# Patient Record
Sex: Male | Born: 1950 | Race: Black or African American | Hispanic: No | Smoking: Former smoker
Health system: Southern US, Community
[De-identification: ages and names within clinical notes are randomized; demographics above are authoritative.]

## PROBLEM LIST (undated history)

## (undated) DIAGNOSIS — F329 Major depressive disorder, single episode, unspecified: Secondary | ICD-10-CM

## (undated) DIAGNOSIS — J189 Pneumonia, unspecified organism: Secondary | ICD-10-CM

## (undated) DIAGNOSIS — F419 Anxiety disorder, unspecified: Secondary | ICD-10-CM

## (undated) DIAGNOSIS — D649 Anemia, unspecified: Secondary | ICD-10-CM

## (undated) DIAGNOSIS — F319 Bipolar disorder, unspecified: Secondary | ICD-10-CM

## (undated) DIAGNOSIS — F32A Depression, unspecified: Secondary | ICD-10-CM

## (undated) DIAGNOSIS — I10 Essential (primary) hypertension: Secondary | ICD-10-CM

## (undated) DIAGNOSIS — Z9289 Personal history of other medical treatment: Secondary | ICD-10-CM

## (undated) DIAGNOSIS — F209 Schizophrenia, unspecified: Secondary | ICD-10-CM

## (undated) DIAGNOSIS — F0390 Unspecified dementia without behavioral disturbance: Secondary | ICD-10-CM

## (undated) DIAGNOSIS — Z8489 Family history of other specified conditions: Secondary | ICD-10-CM

## (undated) HISTORY — DX: Anemia, unspecified: D64.9

## (undated) HISTORY — DX: Depression, unspecified: F32.A

## (undated) HISTORY — DX: Major depressive disorder, single episode, unspecified: F32.9

## (undated) HISTORY — DX: Essential (primary) hypertension: I10

## (undated) HISTORY — DX: Unspecified dementia, unspecified severity, without behavioral disturbance, psychotic disturbance, mood disturbance, and anxiety: F03.90

## (undated) HISTORY — DX: Bipolar disorder, unspecified: F31.9

---
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
## 1991-07-28 HISTORY — PX: COLECTOMY: SHX59

## 2007-05-22 ENCOUNTER — Emergency Department (HOSPITAL_COMMUNITY): Admission: EM | Admit: 2007-05-22 | Discharge: 2007-05-22 | Payer: Self-pay | Admitting: Family Medicine

## 2007-06-20 ENCOUNTER — Emergency Department (HOSPITAL_COMMUNITY): Admission: EM | Admit: 2007-06-20 | Discharge: 2007-06-20 | Payer: Self-pay | Admitting: Family Medicine

## 2008-10-09 ENCOUNTER — Emergency Department (HOSPITAL_COMMUNITY): Admission: EM | Admit: 2008-10-09 | Discharge: 2008-10-09 | Payer: Self-pay | Admitting: Emergency Medicine

## 2009-06-26 ENCOUNTER — Emergency Department (HOSPITAL_COMMUNITY): Admission: EM | Admit: 2009-06-26 | Discharge: 2009-06-26 | Payer: Self-pay | Admitting: Family Medicine

## 2010-06-08 ENCOUNTER — Emergency Department (HOSPITAL_COMMUNITY): Admission: EM | Admit: 2010-06-08 | Discharge: 2010-06-08 | Payer: Self-pay | Admitting: Emergency Medicine

## 2010-10-07 LAB — URINALYSIS, ROUTINE W REFLEX MICROSCOPIC
Bilirubin Urine: NEGATIVE
Glucose, UA: NEGATIVE mg/dL
Ketones, ur: NEGATIVE mg/dL
Protein, ur: NEGATIVE mg/dL
pH: 6.5 (ref 5.0–8.0)

## 2010-10-07 LAB — URINE CULTURE
Colony Count: NO GROWTH
Culture  Setup Time: 201111132105
Culture: NO GROWTH

## 2010-10-07 LAB — CBC
HCT: 36.2 % — ABNORMAL LOW (ref 39.0–52.0)
MCV: 89.7 fL (ref 78.0–100.0)
RBC: 4.04 MIL/uL — ABNORMAL LOW (ref 4.22–5.81)
RDW: 14.1 % (ref 11.5–15.5)
WBC: 11.1 10*3/uL — ABNORMAL HIGH (ref 4.0–10.5)

## 2010-10-07 LAB — DIFFERENTIAL
Eosinophils Relative: 1 % (ref 0–5)
Lymphocytes Relative: 15 % (ref 12–46)
Monocytes Absolute: 0.9 10*3/uL (ref 0.1–1.0)
Monocytes Relative: 9 % (ref 3–12)
Neutro Abs: 8.4 10*3/uL — ABNORMAL HIGH (ref 1.7–7.7)

## 2010-10-07 LAB — CK TOTAL AND CKMB (NOT AT ARMC)
CK, MB: 0.4 ng/mL (ref 0.3–4.0)
Total CK: 63 U/L (ref 7–232)

## 2010-10-07 LAB — COMPREHENSIVE METABOLIC PANEL
AST: 22 U/L (ref 0–37)
Albumin: 3.9 g/dL (ref 3.5–5.2)
Alkaline Phosphatase: 106 U/L (ref 39–117)
BUN: 14 mg/dL (ref 6–23)
Chloride: 108 mEq/L (ref 96–112)
Creatinine, Ser: 1.15 mg/dL (ref 0.4–1.5)
GFR calc Af Amer: >60 mL/min (ref >60)
Potassium: 3.9 mEq/L (ref 3.5–5.1)
Total Bilirubin: 0.5 mg/dL (ref 0.3–1.2)
Total Protein: 8.2 g/dL (ref 6.0–8.3)

## 2010-10-07 LAB — URINE MICROSCOPIC-ADD ON

## 2010-10-28 LAB — URINE CULTURE
Colony Count: NO GROWTH
Culture: NO GROWTH

## 2010-10-28 LAB — GLUCOSE, CAPILLARY: Glucose-Capillary: 87 mg/dL (ref 70–99)

## 2010-10-28 LAB — POCT URINALYSIS DIP (DEVICE)
Nitrite: NEGATIVE
Protein, ur: NEGATIVE mg/dL
Urobilinogen, UA: 1 mg/dL (ref 0.0–1.0)
pH: 7 (ref 5.0–8.0)

## 2010-10-28 LAB — URINALYSIS, MICROSCOPIC ONLY
Bilirubin Urine: NEGATIVE
Ketones, ur: NEGATIVE mg/dL
Leukocytes, UA: NEGATIVE
Nitrite: NEGATIVE
Protein, ur: NEGATIVE mg/dL
Urobilinogen, UA: 1 mg/dL (ref 0.0–1.0)
pH: 7.5 (ref 5.0–8.0)

## 2011-05-05 LAB — POCT URINALYSIS DIP (DEVICE)
Nitrite: NEGATIVE
Operator id: 116391
Protein, ur: 30 — AB
Specific Gravity, Urine: 1.025
Urobilinogen, UA: 0.2

## 2013-02-14 ENCOUNTER — Encounter: Payer: Self-pay | Admitting: Gastroenterology

## 2013-02-17 ENCOUNTER — Encounter: Payer: Self-pay | Admitting: *Deleted

## 2013-02-28 ENCOUNTER — Ambulatory Visit (INDEPENDENT_AMBULATORY_CARE_PROVIDER_SITE_OTHER): Payer: Medicare Other | Admitting: Gastroenterology

## 2013-02-28 ENCOUNTER — Encounter: Payer: Self-pay | Admitting: Gastroenterology

## 2013-02-28 ENCOUNTER — Other Ambulatory Visit (INDEPENDENT_AMBULATORY_CARE_PROVIDER_SITE_OTHER): Payer: Medicare Other

## 2013-02-28 VITALS — BP 80/66 | HR 65 | Ht 68.0 in | Wt 121.1 lb

## 2013-02-28 DIAGNOSIS — N400 Enlarged prostate without lower urinary tract symptoms: Secondary | ICD-10-CM

## 2013-02-28 DIAGNOSIS — F0391 Unspecified dementia with behavioral disturbance: Secondary | ICD-10-CM

## 2013-02-28 DIAGNOSIS — F319 Bipolar disorder, unspecified: Secondary | ICD-10-CM

## 2013-02-28 DIAGNOSIS — K625 Hemorrhage of anus and rectum: Secondary | ICD-10-CM

## 2013-02-28 DIAGNOSIS — R6889 Other general symptoms and signs: Secondary | ICD-10-CM

## 2013-02-28 DIAGNOSIS — D649 Anemia, unspecified: Secondary | ICD-10-CM

## 2013-02-28 DIAGNOSIS — R634 Abnormal weight loss: Secondary | ICD-10-CM

## 2013-02-28 LAB — IBC PANEL
Saturation Ratios: 24 % (ref 20.0–50.0)
Transferrin: 196.4 mg/dL — ABNORMAL LOW (ref 212.0–360.0)

## 2013-02-28 LAB — VITAMIN B12: Vitamin B-12: >1500 pg/mL — ABNORMAL HIGH (ref 211–911)

## 2013-02-28 MED ORDER — NA SULFATE-K SULFATE-MG SULF 17.5-3.13-1.6 GM/177ML PO SOLN: ORAL | Status: DC

## 2013-02-28 NOTE — Progress Notes (Addendum)
History of Present Illness:  This is a 62 year old African American male with chronic dementia referred by Dr. Lerry Liner for evaluation of several months of mild constipation, anorexia with a 30 pound weight loss, intermittent rectal bleeding, and a strong family history of colon cancer in his brother ata young age of 54s, and his father at age 22.  He has not had previous colonoscopy or barium enema exams.  He denies abdominal rectal pain.  Review of his labs shows no evidence of significant anemia.  Patient denies upper GI or hepatobiliary problems, previous surgery, does have hypertensive cardiovascular disease with multi-infarct dementia.  He describes a chronic nonproductive cough.  There is very little data available concerning this patient.  His sister relates that he had recent HIV evaluation, and it was negative.  His sister is with him today, and also relates a long history of bipolar disorder.  He is on Cogentin, Depakote, Lopressor and Zyprexa.    I have reviewed this patient's present history, medical and surgical past history, allergies and medications.  Apparently he had a Harrington rod inserted her spine many years ago.  His dementia has been progressive over many years.  He is a vague history the past of possible small bowel obstruction.  ROS:   All systems were reviewed and are negative unless otherwise stated in the HPI.  Chronic BPH with urinary hesitancy and frequent urinary tract infections.   No Known Allergies No outpatient prescriptions prior to visit.   No facility-administered medications prior to visit.   Past Medical History  Diagnosis Date  . Hypertension   . Bipolar disorder   . Anemia   . Depression    Past Surgical History  Procedure Laterality Date  . Small intestine surgery    . Brain surgery      age 30   History   Social History  . Marital Status: Single    Spouse Name: N/A    Number of Children: 2  . Years of Education: N/A   Occupational  History  . retired    Social History Main Topics  . Smoking status: Former Smoker    Types: Cigarettes    Quit date: 07/27/2004  . Smokeless tobacco: Never Used  . Alcohol Use: No  . Drug Use: No  . Sexual Activity: None   Other Topics Concern  . None   Social History Narrative  . None   Family History  Problem Relation Age of Onset  . Colon cancer Father   . Colon cancer Brother   . Diabetes Mother   . Diabetes Sister   . Kidney disease Brother   . Hypertension Sister     x 2  . Hypertension Mother        Physical Exam: Obviously demented patient is oriented only to person.  Blood pressure 80/66, pulse 65, and weight 121. General well developed well nourished patient in no acute distress, appearing their stated age Eyes PERRLA, no icterus, fundoscopic exam per opthamologist Skin no lesions noted Neck supple, no adenopathy, no thyroid enlargement, no tenderness Chest clear to percussion and auscultation Heart no significant murmurs, gallops or rubs noted Abdomen no hepatosplenomegaly masses or tenderness, BS normal.  Rectal inspection normal no fissures, or fistulae noted.  No masses or tenderness on digital exam. Stool guaiac negative. Extremities no acute joint lesions, edema, phlebitis or evidence of cellulitis. Neurologic patient oriented x 3, cranial nerves intact, no focal neurologic deficits noted. Psychological mental status normal and normal affect.  Assessment and plan: High chance of colon carcinoma in this 62 year old patient with a strong family history of colon cancer, rectal bleeding, with anorexia and weight loss.  Other considerations would be some type of either lung or metastatic carcinoma.  He denies upper GI or hepatobiliary complaints.  I have ordered anemia profile, and have scheduled him for colonoscopy exam.  I suspect she will need CT scans of his chest and abdomen.  Approximately, stool today was guaiac-negative.  Patient today is with his  sister.  They are part of 13 children.  We spent a great deal of time and effort a day checking hisrecords , going over his procedures, and obtaining his needed data.  Apparently on reviewing his records he has been on recent Septra DS for presumed UTI.

## 2013-02-28 NOTE — Patient Instructions (Addendum)
  You have been scheduled for a colonoscopy with propofol. Please follow written instructions given to you at your visit today.  Please pick up your prep kit at the pharmacy within the next 1-3 days. If you use inhalers (even only as needed), please bring them with you on the day of your procedure. Your physician has requested that you go to www.startemmi.com and enter the access code given to you at your visit today. This web site gives a general overview about your procedure. However, you should still follow specific instructions given to you by our office regarding your preparation for the procedure.  Your physician has requested that you go to the basement for the following lab work before leaving today: Anemia Panel _________________________________________________________                                               We are excited to introduce MyChart, a new best-in-class service that provides you online access to important information in your electronic medical record. We want to make it easier for you to view your health information - all in one secure location - when and where you need it. We expect MyChart will enhance the quality of care and service we provide.  When you register for MyChart, you can:    View your test results.    Request appointments and receive appointment reminders via email.    Request medication renewals.    View your medical history, allergies, medications and immunizations.    Communicate with your physician's office through a password-protected site.    Conveniently print information such as your medication lists.  To find out if MyChart is right for you, please talk to a member of our clinical staff today. We will gladly answer your questions about this free health and wellness tool.  If you are age 61 or older and want a member of your family to have access to your record, you must provide written consent by completing a proxy form available at our  office. Please speak to our clinical staff about guidelines regarding accounts for patients younger than age 61.  As you activate your MyChart account and need any technical assistance, please call the MyChart technical support line at [phone]) or email your question to [email or web address]. If you email your question(s), please include your name, a return phone number and the best time to reach you.  If you have non-urgent health-related questions, you can send a message to our office through MyChart at [email or web address]. If you have a medical emergency, call 911.  Thank you for using MyChart as your new health and wellness resource!   MyChart licensed from [employer],  4540-9811. Patents Pending.

## 2013-03-03 ENCOUNTER — Encounter: Payer: Medicare Other | Admitting: Gastroenterology

## 2013-03-13 ENCOUNTER — Encounter: Payer: Self-pay | Admitting: Gastroenterology

## 2013-03-13 ENCOUNTER — Ambulatory Visit (AMBULATORY_SURGERY_CENTER): Payer: Medicare Other | Admitting: Gastroenterology

## 2013-03-13 VITALS — BP 105/65 | HR 53 | Temp 97.4°F | Resp 13 | Ht 68.0 in | Wt 121.0 lb

## 2013-03-13 DIAGNOSIS — K5730 Diverticulosis of large intestine without perforation or abscess without bleeding: Secondary | ICD-10-CM

## 2013-03-13 DIAGNOSIS — Z800 Family history of malignant neoplasm of digestive organs: Secondary | ICD-10-CM

## 2013-03-13 DIAGNOSIS — K625 Hemorrhage of anus and rectum: Secondary | ICD-10-CM

## 2013-03-13 MED ORDER — SODIUM CHLORIDE 0.9 % IV SOLN: 500.0000 mL | INTRAVENOUS | Status: DC

## 2013-03-13 NOTE — Progress Notes (Signed)
Procedure ends, to recovery, report given and VSS. 

## 2013-03-13 NOTE — Progress Notes (Signed)
Patient did not experience any of the following events: a burn prior to discharge; a fall within the facility; wrong site/side/patient/procedure/implant event; or a hospital transfer or hospital admission upon discharge from the facility. (G8907) Patient did not have preoperative order for IV antibiotic SSI prophylaxis. (G8918)  

## 2013-03-13 NOTE — Op Note (Signed)
[facility] [address] [place], [address]   COLONOSCOPY PROCEDURE REPORT  PATIENT: Jeffrey Davila, Jeffrey Davila  MR#: [id number] BIRTHDATE: [birth year] , 62  yrs. old GENDER: Male ENDOSCOPIST: Mardella Layman, MD, [facility] REFERRED BY: PROCEDURE DATE:  03/13/2013 PROCEDURE:   Colonoscopy, screening First Screening Colonoscopy - Avg.  risk and is 50 yrs.  old or older Yes.  Prior Negative Screening - Now for repeat screening. N/A ASA CLASS:   Class III INDICATIONS:Colorectal cancer screening.Marland KitchenMarland KitchenDemented patient and ?? prep compliance,very difficult IV insertion !!!! MEDICATIONS: Propofol (Diprivan) 260 mg IV  DESCRIPTION OF PROCEDURE:   After the risks benefits and alternatives of the procedure were thoroughly explained, informed consent was obtained.  A digital rectal exam revealed no abnormalities of the rectum.   The LB [id number]  endoscope was introduced through the anus and advanced to the cecum, which was identified by both the appendix and ileocecal valve. No adverse events experienced.   The quality of the prep was poor, using MoviPrep  The instrument was then slowly withdrawn as the colon was fully examined.      COLON FINDINGS: Severe diverticulosis was noted in the descending colon and sigmoid colon.   The colon was poorly prepped throughout its length.  It was miraculously we were able to the cecum.  There is extensive diverticulosis,but no large mucosal polypoid lesions. There also was no evidence of blood in areas of large amount of stool to suggest underlying malignancy.  It is only approximate 50% confidence visualization factor because of extremely poor prep. The colon was poorly prepped throughout its length.  It was miraculously we were able to the cecum.  There is extensive diverticulosis,but no large mucosal polypoid lesions.  There also was no evidence of blood in areas of large amount of stool to suggest underlying malignancy.  It is  only approximate 50% confidence visualization factor because of extremely poor prep. Retroflexion was not performed. The time to cecum=12 minutes 41 seconds.  Withdrawal time=6 minutes 34 seconds.  The scope was withdrawn and the procedure completed. COMPLICATIONS: There were no complications.  ENDOSCOPIC IMPRESSION: 1.   Severe diverticulosis was noted in the descending colon and sigmoid colon 2.   The colon was poorly prepped throughout its length.  It was miraculously we were able to the cecum.  There is extensive diverticulosis,but no large mucosal polypoid lesions.  There also was no evidence of blood in areas of large amount of stool to suggest underlying malignancy.  It is only approximate 50% confidence visualization factor because of extremely poor prep. 3. This is an extremely poor prep, and a very difficult patient, but I feel rather confident that he does not have underlying colon cancer.  Her were for him back to Lerry Liner further workup of his medical problems.   RECOMMENDATIONS: Continue current medications.Marland KitchenMarland KitchenF/u I care.   eSigned:  Mardella Layman, MD, [facility] 03/13/2013 4:29 PM   cc: Lerry Liner MD

## 2013-03-13 NOTE — Progress Notes (Addendum)
Pt with dementia, bi-polar and sister helping pt. Initial bp 96/57.  Pt very difficult stick. Attempt 1 by s hodges rn to rt hand, attempt 2 to rt upper arm by b walton crna, unsuccessful, attempt 3 to rt hand by e maccraw rn unsuccessful, attrempt 4 to right ac by b walton crna unsuccessful. Attempt 5 successful to rt inner AC by t walton rn and pt did tolerate all sticks well. No distress noted. ewm rn

## 2013-03-13 NOTE — Patient Instructions (Addendum)
YOU HAD AN ENDOSCOPIC PROCEDURE TODAY AT THE [facility]: Refer to the procedure report that was given to you for any specific questions about what was found during the examination.  If the procedure report does not answer your questions, please call your gastroenterologist to clarify.  If you requested that your care partner not be given the details of your procedure findings, then the procedure report has been included in a sealed envelope for you to review at your convenience later.  YOU SHOULD EXPECT: Some feelings of bloating in the abdomen. Passage of more gas than usual.  Walking can help get rid of the air that was put into your GI tract during the procedure and reduce the bloating. If you had a lower endoscopy (such as a colonoscopy or flexible sigmoidoscopy) you may notice spotting of blood in your stool or on the toilet paper. If you underwent a bowel prep for your procedure, then you may not have a normal bowel movement for a few days.  DIET: Your first meal following the procedure should be a light meal and then it is ok to progress to your normal diet.  A half-sandwich or bowl of soup is an example of a good first meal.  Heavy or fried foods are harder to digest and may make you feel nauseous or bloated.  Likewise meals heavy in dairy and vegetables can cause extra gas to form and this can also increase the bloating.  Drink plenty of fluids but you should avoid alcoholic beverages for 24 hours.  ACTIVITY: Your care partner should take you home directly after the procedure.  You should plan to take it easy, moving slowly for the rest of the day.  You can resume normal activity the day after the procedure however you should NOT DRIVE or use heavy machinery for 24 hours (because of the sedation medicines used during the test).    SYMPTOMS TO REPORT IMMEDIATELY: A gastroenterologist can be reached at any hour.  During normal business hours, 8:30 AM to 5:00 PM Monday through Friday,  call [phone].  After hours and on weekends, please call the GI answering service at [phone] who will take a message and have the physician on call contact you.   Following lower endoscopy (colonoscopy or flexible sigmoidoscopy):  Excessive amounts of blood in the stool  Significant tenderness or worsening of abdominal pains  Swelling of the abdomen that is new, acute  Fever of 100F or higher    FOLLOW UP: If any biopsies were taken you will be contacted by phone or by letter within the next 1-3 weeks.  Call your gastroenterologist if you have not heard about the biopsies in 3 weeks.  Our staff will call the home number listed on your records the next business day following your procedure to check on you and address any questions or concerns that you may have at that time regarding the information given to you following your procedure. This is a courtesy call and so if there is no answer at the home number and we have not heard from you through the emergency physician on call, we will assume that you have returned to your regular daily activities without incident.  SIGNATURES/CONFIDENTIALITY: You and/or your care partner have signed paperwork which will be entered into your electronic medical record.  These signatures attest to the fact that that the information above on your After Visit Summary has been reviewed and is understood.  Full responsibility of the confidentiality   of this discharge information lies with you and/or your care-partner.   Information on diverticulosis given to you today   

## 2013-03-13 NOTE — Progress Notes (Signed)
Upon induction IV in R AC noted infiltrated. #22 R hand started by Selinda Michaels, RN

## 2013-03-13 NOTE — Progress Notes (Addendum)
NO EGG OR SOY ALLERGY. EWM No problems with past sedation in the past.  Hard to wake. ewm

## 2013-03-14 ENCOUNTER — Telehealth: Payer: Self-pay | Admitting: *Deleted

## 2013-03-14 NOTE — Telephone Encounter (Signed)
  Follow up Call-  Call back number 03/13/2013  Post procedure Call Back phone  # [phone]  Permission to leave phone message Yes     Patient questions:  Do you have a fever, pain , or abdominal swelling? no Pain Score  0 *  Have you tolerated food without any problems? yes  Have you been able to return to your normal activities? yes  Do you have any questions about your discharge instructions: Diet   no Medications  no Follow up visit  no  Do you have questions or concerns about your Care? no  Actions: * If pain score is 4 or above: No action needed, pain <4.

## 2016-07-27 DIAGNOSIS — J189 Pneumonia, unspecified organism: Secondary | ICD-10-CM

## 2016-07-27 HISTORY — PX: LUNG REMOVAL, PARTIAL: SHX233

## 2016-07-27 HISTORY — DX: Pneumonia, unspecified organism: J18.9

## 2018-02-09 ENCOUNTER — Ambulatory Visit: Payer: Self-pay | Admitting: Emergency Medicine

## 2018-02-16 ENCOUNTER — Ambulatory Visit: Payer: Medicare Other | Admitting: Emergency Medicine

## 2018-02-18 ENCOUNTER — Ambulatory Visit: Payer: Medicare Other | Admitting: Emergency Medicine

## 2018-02-24 ENCOUNTER — Ambulatory Visit: Payer: Medicare Other | Admitting: Family Medicine

## 2018-03-18 ENCOUNTER — Emergency Department (HOSPITAL_COMMUNITY)
Admission: EM | Admit: 2018-03-18 | Discharge: 2018-03-18 | Disposition: A | Discharge status: Home / Self Care | Payer: Medicare Other | Attending: Emergency Medicine | Admitting: Emergency Medicine

## 2018-03-18 ENCOUNTER — Other Ambulatory Visit: Payer: Self-pay

## 2018-03-18 ENCOUNTER — Encounter (HOSPITAL_COMMUNITY): Payer: Self-pay | Admitting: Emergency Medicine

## 2018-03-18 DIAGNOSIS — Z79899 Other long term (current) drug therapy: Secondary | ICD-10-CM | POA: Diagnosis not present

## 2018-03-18 DIAGNOSIS — F0390 Unspecified dementia without behavioral disturbance: Secondary | ICD-10-CM | POA: Insufficient documentation

## 2018-03-18 DIAGNOSIS — I10 Essential (primary) hypertension: Secondary | ICD-10-CM | POA: Diagnosis not present

## 2018-03-18 DIAGNOSIS — F319 Bipolar disorder, unspecified: Secondary | ICD-10-CM | POA: Diagnosis not present

## 2018-03-18 DIAGNOSIS — Z87891 Personal history of nicotine dependence: Secondary | ICD-10-CM | POA: Insufficient documentation

## 2018-03-18 DIAGNOSIS — Z760 Encounter for issue of repeat prescription: Secondary | ICD-10-CM | POA: Insufficient documentation

## 2018-03-18 MED ORDER — OLANZAPINE 10 MG PO TABS: 10.0000 mg | ORAL_TABLET | Freq: Two times a day (BID) | ORAL | 0 refills | Status: DC

## 2018-03-18 MED ORDER — TAMSULOSIN HCL 0.4 MG PO CAPS: 0.4000 mg | ORAL_CAPSULE | Freq: Every day | ORAL | 0 refills | Status: AC

## 2018-03-18 MED ORDER — DIVALPROEX SODIUM 500 MG PO DR TAB: 500.0000 mg | DELAYED_RELEASE_TABLET | Freq: Two times a day (BID) | ORAL | 0 refills | Status: DC

## 2018-03-18 MED ORDER — BENZTROPINE MESYLATE 1 MG PO TABS: 1.0000 mg | ORAL_TABLET | Freq: Two times a day (BID) | ORAL | 0 refills | Status: DC

## 2018-03-18 NOTE — Discharge Instructions (Signed)
I have refilled Jeffrey Davila's medications for 2 weeks.  Please establish care as soon as possible with a new primary care provider.  Please only take a half a pill of the Lopressor or metoprolol twice daily, as his blood pressure is on the lower side.  Please return to the emergency department if he develops any new concerns.

## 2018-03-18 NOTE — ED Provider Notes (Signed)
[facility] EMERGENCY DEPARTMENT Provider Note   CSN: [id number] Arrival date & time: 03/18/18  1846     History   Chief Complaint Chief Complaint  Patient presents with  . Medication Refill    HPI Jeffrey Davila is a 67 y.o. male.  HPI  Patient is a 67 year old male with a history of traumatic brain injury, dementia, depression, hypertension, bipolar disorder presenting for medication refill.  Patient presents with his sister, who is his caregiver and power of attorney.  Patient's sister states that they have moved from [facility], and in the interim they are without a physician, and primary physician to pay bills unable to refill his medications.  She reports that he is going to see a primary doctor, but there will be a lapse.  Patient denies any distress, pain, tremoring, involuntary movements, weakness, fever, chills, nausea, vomiting, abdominal pain.  Patient sister states he is at his baseline with normal speech.  She does note that he has lost a significant amount of weight in the past year.  Past Medical History:  Diagnosis Date  . Anemia   . Bipolar disorder (HCC)   . Dementia   . Depression   . Hypertension     There are no active problems to display for this patient.   Past Surgical History:  Procedure Laterality Date  . BRAIN SURGERY     age 46  . SMALL INTESTINE SURGERY          Home Medications    Prior to Admission medications   Medication Sig Start Date End Date Taking? Authorizing Provider  benztropine (COGENTIN) 1 MG tablet Take 1 mg by mouth 2 (two) times daily.  01/23/13   [provider]  divalproex (DEPAKOTE ER) 500 MG 24 hr tablet Take 500 mg by mouth daily.  01/23/13   [provider]  metoprolol tartrate (LOPRESSOR) 25 MG tablet Take 25 mg by mouth daily.  02/27/13   [provider]  Na Sulfate-K Sulfate-Mg Sulf (SUPREP BOWEL PREP) SOLN USE PER PREP INSTRUCTIONS 02/28/13   Mardella Layman, MD  OLANZapine (ZYPREXA) 10 MG tablet Take 10 mg by mouth at bedtime.  01/23/13   [provider]    Family History Family History  Problem Relation Age of Onset  . Colon cancer Father   . Prostate cancer Father   . Lung cancer Father   . Colon cancer Brother   . Diabetes Mother   . Hypertension Mother   . Diabetes Sister   . Kidney disease Brother   . Hypertension Sister        x 2    Social History Social History   Tobacco Use  . Smoking status: Former Smoker    Types: Cigarettes    Last attempt to quit: 07/27/2004    Years since quitting: 13.6  . Smokeless tobacco: Never Used  Substance Use Topics  . Alcohol use: No  . Drug use: No     Allergies   Patient has no known allergies.   Review of Systems Review of Systems  Constitutional: Negative for chills and fever.  Respiratory: Negative for shortness of breath.   Cardiovascular: Negative for chest pain.  Gastrointestinal: Negative for abdominal pain, constipation, nausea and vomiting.  Neurological: Negative for dizziness, seizures, speech difficulty, weakness, numbness and headaches.     Physical Exam Updated Vital Signs BP 94/63 (BP Location: Left Arm)   Pulse 79   Temp 99.1 F (37.3 C) (  Oral)   Resp 16   Ht 5\' 6"  (1.676 m)   Wt 54.4 kg   SpO2 100%   BMI 19.37 kg/m   Physical Exam  Constitutional: He appears well-developed and well-nourished. No distress.  HENT:  Head: Normocephalic and atraumatic.  Mouth/Throat: Oropharynx is clear and moist.  Eyes: Conjunctivae and EOM are normal.  Opacification of bilateral cornea, consistent with cataract.  Neck: Normal range of motion. Neck supple.  Cardiovascular: Normal rate, regular rhythm, S1 normal and S2 normal.  No murmur heard. Pulmonary/Chest: Effort normal and breath sounds normal. He has no wheezes. He has no rales.  Abdominal: Soft. He exhibits no distension. There is no tenderness. There is no guarding.  Musculoskeletal:  Normal range of motion. He exhibits no edema or deformity.  Neurological: He is alert.  Cranial nerves grossly intact. Patient moves extremities symmetrically and with good coordination.  Skin: Skin is warm and dry. No rash noted. No erythema.  Psychiatric: He has a normal mood and affect. His behavior is normal. Judgment and thought content normal.  Nursing note and vitals reviewed.    ED Treatments / Results  Labs (all labs ordered are listed, but only abnormal results are displayed) Labs Reviewed - No data to display  EKG None  Radiology No results found.  Procedures Procedures (including critical care time)  Medications Ordered in ED Medications - No data to display   Initial Impression / Assessment and Plan / ED Course  I have reviewed the triage vital signs and the nursing notes.  Pertinent labs & imaging results that were available during my care of the patient were reviewed by me and considered in my medical decision making (see chart for details).  Clinical Course as of Mar 18 1912  Fri Mar 18, 2018  1911 Patient has been getting one Metoprolol pill BID instead of half. D/w with daughter to review this dose.   BP: 94/63 [AM]    Clinical Course User Index [AM] Elisha PonderMurray, Michi Herrmann B, PA-C    Patient nontoxic-appearing and in no acute distress.  Patient at neurologic baseline per sister.  There appears to be no acute symptoms today beyond the need for medication refill, as patient is moving, and establishing care with a primary care provider.  I discussed with the sister that I can refill for 2 weeks in the interim until he can establish care with his new primary care provider.  Additionally, I discussed with patient's sister reduction in the metoprolol pill, as his blood pressure is soft.  No signs of infection or other source of low blood pressure today.  This is a supervised visit with Dr. Vanetta MuldersScott Zackowski. Evaluation, management, and discharge planning discussed with  this attending physician.  Final Clinical Impressions(s) / ED Diagnoses   Final diagnoses:  Encounter for medication refill    ED Discharge Orders         Ordered    divalproex (DEPAKOTE) 500 MG DR tablet  2 times daily     03/18/18 1943    benztropine (COGENTIN) 1 MG tablet  2 times daily     03/18/18 1943    OLANZapine (ZYPREXA) 10 MG tablet  2 times daily     03/18/18 1943    tamsulosin (FLOMAX) 0.4 MG CAPS capsule  Daily     03/18/18 1943           Delia ChimesMurray, Joel Mericle B, PA-C 03/18/18 1944    Vanetta MuldersZackowski, Scott, MD 03/27/18 1727

## 2018-03-18 NOTE — ED Triage Notes (Signed)
Pt here with legal guardian for a medication refill of Divalpoex, Tamsulosin and a bipolar medication.

## 2018-03-21 ENCOUNTER — Ambulatory Visit: Payer: Medicare Other | Admitting: Family Medicine

## 2018-03-30 ENCOUNTER — Ambulatory Visit: Payer: Medicare Other | Admitting: Family Medicine

## 2018-04-12 ENCOUNTER — Encounter (HOSPITAL_COMMUNITY): Payer: Self-pay | Admitting: Emergency Medicine

## 2018-04-12 ENCOUNTER — Other Ambulatory Visit: Payer: Self-pay

## 2018-04-12 ENCOUNTER — Emergency Department (HOSPITAL_COMMUNITY): Payer: Medicare Other

## 2018-04-12 ENCOUNTER — Emergency Department (HOSPITAL_COMMUNITY)
Admission: EM | Admit: 2018-04-12 | Discharge: 2018-04-12 | Disposition: A | Discharge status: Home / Self Care | Payer: Medicare Other | Attending: Emergency Medicine | Admitting: Emergency Medicine

## 2018-04-12 DIAGNOSIS — Z87891 Personal history of nicotine dependence: Secondary | ICD-10-CM | POA: Insufficient documentation

## 2018-04-12 DIAGNOSIS — R509 Fever, unspecified: Secondary | ICD-10-CM | POA: Insufficient documentation

## 2018-04-12 DIAGNOSIS — Z760 Encounter for issue of repeat prescription: Secondary | ICD-10-CM | POA: Diagnosis present

## 2018-04-12 DIAGNOSIS — Z79899 Other long term (current) drug therapy: Secondary | ICD-10-CM | POA: Diagnosis not present

## 2018-04-12 DIAGNOSIS — N390 Urinary tract infection, site not specified: Secondary | ICD-10-CM | POA: Diagnosis not present

## 2018-04-12 DIAGNOSIS — N3000 Acute cystitis without hematuria: Secondary | ICD-10-CM

## 2018-04-12 DIAGNOSIS — I10 Essential (primary) hypertension: Secondary | ICD-10-CM | POA: Diagnosis not present

## 2018-04-12 LAB — COMPREHENSIVE METABOLIC PANEL
ALK PHOS: 62 U/L (ref 38–126)
ALT: 8 U/L (ref 0–44)
AST: 14 U/L — ABNORMAL LOW (ref 15–41)
Albumin: 3.3 g/dL — ABNORMAL LOW (ref 3.5–5.0)
Anion gap: 13 (ref 5–15)
BUN: 21 mg/dL (ref 8–23)
CALCIUM: 9 mg/dL (ref 8.9–10.3)
CO2: 21 mmol/L — ABNORMAL LOW (ref 22–32)
CREATININE: 1.08 mg/dL (ref 0.61–1.24)
Chloride: 107 mmol/L (ref 98–111)
Glucose, Bld: 87 mg/dL (ref 70–99)
Potassium: 4.2 mmol/L (ref 3.5–5.1)
Sodium: 141 mmol/L (ref 135–145)
Total Bilirubin: 0.4 mg/dL (ref 0.3–1.2)
Total Protein: 6.7 g/dL (ref 6.5–8.1)

## 2018-04-12 LAB — URINALYSIS, ROUTINE W REFLEX MICROSCOPIC
BILIRUBIN URINE: NEGATIVE
Glucose, UA: NEGATIVE mg/dL
Ketones, ur: NEGATIVE mg/dL
Nitrite: NEGATIVE
PH: 7 (ref 5.0–8.0)
Protein, ur: 30 mg/dL — AB
SPECIFIC GRAVITY, URINE: 1.015 (ref 1.005–1.030)

## 2018-04-12 LAB — CBC WITH DIFFERENTIAL/PLATELET
Abs Immature Granulocytes: 0 10*3/uL (ref 0.0–0.1)
BASOS PCT: 0 %
Basophils Absolute: 0 10*3/uL (ref 0.0–0.1)
Eosinophils Absolute: 0.1 10*3/uL (ref 0.0–0.7)
Eosinophils Relative: 1 %
HCT: 31.6 % — ABNORMAL LOW (ref 39.0–52.0)
HEMOGLOBIN: 9.4 g/dL — AB (ref 13.0–17.0)
IMMATURE GRANULOCYTES: 0 %
LYMPHS PCT: 19 %
Lymphs Abs: 1.9 10*3/uL (ref 0.7–4.0)
MCH: 30.7 pg (ref 26.0–34.0)
MCHC: 29.7 g/dL — ABNORMAL LOW (ref 30.0–36.0)
MCV: 103.3 fL — AB (ref 78.0–100.0)
MONOS PCT: 8 %
Monocytes Absolute: 0.8 10*3/uL (ref 0.1–1.0)
NEUTROS ABS: 7.1 10*3/uL (ref 1.7–7.7)
NEUTROS PCT: 72 %
PLATELETS: 343 10*3/uL (ref 150–400)
RBC: 3.06 MIL/uL — ABNORMAL LOW (ref 4.22–5.81)
RDW: 16.3 % — ABNORMAL HIGH (ref 11.5–15.5)
WBC: 10 10*3/uL (ref 4.0–10.5)

## 2018-04-12 MED ORDER — DIVALPROEX SODIUM 500 MG PO DR TAB: 500.0000 mg | DELAYED_RELEASE_TABLET | Freq: Two times a day (BID) | ORAL | 0 refills | Status: AC

## 2018-04-12 MED ORDER — OLANZAPINE 10 MG PO TABS: 10.0000 mg | ORAL_TABLET | Freq: Two times a day (BID) | ORAL | 0 refills | Status: DC

## 2018-04-12 MED ORDER — CEPHALEXIN 500 MG PO CAPS: 500.0000 mg | ORAL_CAPSULE | Freq: Two times a day (BID) | ORAL | 0 refills | Status: AC

## 2018-04-12 MED ORDER — CEFTRIAXONE SODIUM 1 G IJ SOLR
1.0000 g | Freq: Once | INTRAMUSCULAR | Status: AC
  Administered 2018-04-12: 1 g via INTRAMUSCULAR
  Filled 2018-04-12 (×2): qty 10

## 2018-04-12 MED ORDER — ACETAMINOPHEN 325 MG PO TABS
650.0000 mg | ORAL_TABLET | Freq: Once | ORAL | Status: AC
  Administered 2018-04-12: 650 mg via ORAL
  Filled 2018-04-12: qty 2

## 2018-04-12 MED ORDER — LIDOCAINE HCL (PF) 1 % IJ SOLN
INTRAMUSCULAR | Status: AC
  Administered 2018-04-12: 5 mL
  Filled 2018-04-12: qty 5

## 2018-04-12 MED ORDER — TAMSULOSIN HCL 0.4 MG PO CAPS: 0.4000 mg | ORAL_CAPSULE | Freq: Every day | ORAL | 0 refills | Status: AC

## 2018-04-12 MED ORDER — BENZTROPINE MESYLATE 1 MG PO TABS: 1.0000 mg | ORAL_TABLET | Freq: Two times a day (BID) | ORAL | 0 refills | Status: AC

## 2018-04-12 NOTE — ED Notes (Signed)
Pt given water per Vernona RiegerLaura (PA). Asked family member if she would like us to put a condom catheter on the pt, family member refused and stated he could use a urinal.

## 2018-04-12 NOTE — ED Provider Notes (Signed)
[facility] EMERGENCY DEPARTMENT Provider Note   CSN: [id number] Arrival date & time: 04/12/18  1707     History   Chief Complaint Chief Complaint  Patient presents with  . Medication Refill    HPI Jeffrey Davila is a 67 y.o. male.  67 year old male brought in by his sister who states she is his power of attorney requesting refill of his medications.  Patient recently moved here from [facility] and is living with his sister, was seen in this emergency room 2 weeks ago with same request, refills were given for 2 weeks of his Cogentin, Depakote, Zyprexa, Flomax.  Patient's blood pressure was found to be slightly low at his last visit and was advised that he cut his metoprolol in half, sister states she has been doing this.  Sister has not arranged for follow-up with primary care locally and is here for additional refills of medications.  Patient denies any complaints today and states that he is feeling well, sister denies any concerns for his health or condition.      Past Medical History:  Diagnosis Date  . Anemia   . Bipolar disorder (HCC)   . Dementia   . Depression   . Hypertension     There are no active problems to display for this patient.   Past Surgical History:  Procedure Laterality Date  . BRAIN SURGERY     age 64  . SMALL INTESTINE SURGERY          Home Medications    Prior to Admission medications   Medication Sig Start Date End Date Taking? Authorizing Provider  benztropine (COGENTIN) 1 MG tablet Take 1 tablet (1 mg total) by mouth 2 (two) times daily for 14 days. 04/12/18 04/26/18  Jeannie Fend, PA-C  cephALEXin (KEFLEX) 500 MG capsule Take 1 capsule (500 mg total) by mouth 2 (two) times daily for 10 days. 04/12/18 04/22/18  Jeannie Fend, PA-C  divalproex (DEPAKOTE) 500 MG DR tablet Take 1 tablet (500 mg total) by mouth 2 (two) times daily for 14 days. 04/12/18 04/26/18  Army Melia A, PA-C  metoprolol tartrate  (LOPRESSOR) 25 MG tablet Take 25 mg by mouth daily.  02/27/13   [provider]  Na Sulfate-K Sulfate-Mg Sulf (SUPREP BOWEL PREP) SOLN USE PER PREP INSTRUCTIONS 02/28/13   Mardella Layman, MD  OLANZapine (ZYPREXA) 10 MG tablet Take 1 tablet (10 mg total) by mouth 2 (two) times daily for 14 days. 04/12/18 04/26/18  Jeannie Fend, PA-C  tamsulosin (FLOMAX) 0.4 MG CAPS capsule Take 1 capsule (0.4 mg total) by mouth daily for 14 days. 04/12/18 04/26/18  Jeannie Fend, PA-C    Family History Family History  Problem Relation Age of Onset  . Colon cancer Father   . Prostate cancer Father   . Lung cancer Father   . Colon cancer Brother   . Diabetes Mother   . Hypertension Mother   . Diabetes Sister   . Kidney disease Brother   . Hypertension Sister        x 2    Social History Social History   Tobacco Use  . Smoking status: Former Smoker    Types: Cigarettes    Last attempt to quit: 07/27/2004    Years since quitting: 13.7  . Smokeless tobacco: Never Used  Substance Use Topics  . Alcohol use: No  . Drug use: No     Allergies   Patient has no known  allergies.   Review of Systems Review of Systems  Unable to perform ROS: Psychiatric disorder  HENT: Negative for congestion.   Respiratory: Negative for cough and shortness of breath.   Cardiovascular: Negative for chest pain.  Gastrointestinal: Negative for abdominal pain, constipation, diarrhea and vomiting.  Genitourinary: Negative for difficulty urinating.  Musculoskeletal: Negative for arthralgias and myalgias.  Skin: Negative for rash and wound.  Allergic/Immunologic: Negative for immunocompromised state.     Physical Exam Updated Vital Signs BP (!) 132/98   Pulse (!) 107   Temp (!) 100.4 F (38 C) (Rectal)   Resp 16   Ht 5\' 6"  (1.676 m)   Wt 54.4 kg   SpO2 100%   BMI 19.37 kg/m   Physical Exam  Constitutional: He is oriented to person, place, and time. He appears well-developed and well-nourished. No  distress.  HENT:  Head: Normocephalic and atraumatic.  Nose: Nose normal.  Mouth/Throat: No oropharyngeal exudate.  Eyes: Conjunctivae are normal.  Neck: Neck supple.  Cardiovascular: Regular rhythm, normal heart sounds and intact distal pulses. Tachycardia present.  No murmur heard. Pulmonary/Chest: Effort normal and breath sounds normal. No respiratory distress.  Abdominal: Soft. There is no tenderness. There is no guarding.  Neurological: He is alert and oriented to person, place, and time.  Skin: Skin is warm and dry. He is not diaphoretic.  Psychiatric: He has a normal mood and affect. His behavior is normal.  Nursing note and vitals reviewed.    ED Treatments / Results  Labs (all labs ordered are listed, but only abnormal results are displayed) Labs Reviewed  COMPREHENSIVE METABOLIC PANEL - Abnormal; Notable for the following components:      Result Value   CO2 21 (*)    Albumin 3.3 (*)    AST 14 (*)    All other components within normal limits  CBC WITH DIFFERENTIAL/PLATELET - Abnormal; Notable for the following components:   RBC 3.06 (*)    Hemoglobin 9.4 (*)    HCT 31.6 (*)    MCV 103.3 (*)    MCHC 29.7 (*)    RDW 16.3 (*)    All other components within normal limits  URINALYSIS, ROUTINE W REFLEX MICROSCOPIC - Abnormal; Notable for the following components:   Color, Urine AMBER (*)    APPearance TURBID (*)    Hgb urine dipstick SMALL (*)    Protein, ur 30 (*)    Leukocytes, UA LARGE (*)    WBC, UA >50 (*)    Bacteria, UA MANY (*)    All other components within normal limits  CULTURE, BLOOD (ROUTINE X 2)  CULTURE, BLOOD (ROUTINE X 2)  URINE CULTURE  I-STAT CG4 LACTIC ACID, ED  I-STAT CG4 LACTIC ACID, ED    EKG None  Radiology Dg Chest 2 View  Result Date: 04/12/2018 CLINICAL DATA:  Fever of unknown origin. EXAM: CHEST - 2 VIEW COMPARISON:  None. FINDINGS: The heart size is within normal limits. Ectasia of the thoracic aorta is noted. Pulmonary  hyperinflation is seen, consistent with COPD. Mild scarring noted at right lung base as well as several old right rib fracture deformities. No evidence of pulmonary infiltrate, edema, or pleural effusion. IMPRESSION: COPD.  No active cardiopulmonary disease. Electronically Signed   By: Myles Rosenthal M.D.   On: 04/12/2018 19:16    Procedures Procedures (including critical care time)  Medications Ordered in ED Medications  cefTRIAXone (ROCEPHIN) injection 1 g (has no administration in time range)  lidocaine (PF) (XYLOCAINE)  1 % injection (has no administration in time range)  acetaminophen (TYLENOL) tablet 650 mg (650 mg Oral Given 04/12/18 2050)     Initial Impression / Assessment and Plan / ED Course  I have reviewed the triage vital signs and the nursing notes.  Pertinent labs & imaging results that were available during my care of the patient were reviewed by me and considered in my medical decision making (see chart for details).  Clinical Course as of Apr 12 2144  Tue Apr 12, 2018  [id number] 67 year old male brought in by his sister who is his power of attorney requesting refill of his medications.  Patient is recently relocated to this area, does not have PCP, last was given 2 weeks of refills the emergency room 2 weeks ago.  Sister is requesting refill of Cogentin, Zyprexa, Depakote, Flomax.  Patient sister has been cutting his metoprolol in half as directed at his last ER visit.  Patient denies any complaints, sister denies any concerns other than refilling his medications tonight.  Review of triage vital signs, patient's blood pressure is 100/69, pulse of 100, oral temp of 99.9 (patient is a mouth breather).  Vitals were rechecked at any intervention, his blood pressure had improved to 132/98, rate tachycardic at 107, rectal temperature of 100.4.  Patient is not a reliable historian, lab work, chest x-ray ordered for further evaluation.  Chest x-ray shows COPD and old rib fractures.  Lab work  shows CBC with anemia, hemoglobin 9.4, review of care everywhere labs shows this is not unusual for this patient, his CMP has a bicarb of 21, albumin 3.3, AST of 14 otherwise unremarkable.  Urinalysis suggestive of urinary tract infection with large leukocytes and greater than 50 white cells with many bacteria.  Phlebotomist was able to obtain one blood culture however unable to obtain a second blood culture or lactic acid before patient's sister refused any further lab work on this patient.  Patient sister and given consent for me to review his medical records in the computer, states she does not carry the records with her however he goes to [employer] and records should be in the computer.  I was able to locate records on this patient, sister then refused to sign the release form for the information for [employer].  Records used to verify medication dosages as well as previous lab work.  Patient did have a urinary tract infection in May 2019 which on culture grew 100,000 Klebsiella    [LM]  2140 pneumoniae which was sensitive to first generation cephalosporins and patient was treated with Keflex.  Patient was given a dose of Rocephin while in the ER today, prescription for Keflex.  2-week refills given.  Social work was consulted while patient was in the emergency room and has arranged follow-up plan with patient and sister.  Discussed urinary tract infection with patient and family, advised to monitor for ongoing fever, any changes in mental status, should return to emergency room immediately for concerns of urosepsis.   [LM]  2145 Case was discussed with Dr. Juleen ChinaKohut, ER attending, agrees with plan of care.   [LM]    Clinical Course User Index [LM] Jeannie FendMurphy, [facility] A, PA-C    Final Clinical Impressions(s) / ED Diagnoses   Final diagnoses:  Fever, unspecified fever cause  Acute cystitis without hematuria    ED Discharge Orders         Ordered    cephALEXin (KEFLEX) 500 MG capsule  2 times daily  04/12/18 2129    benztropine (COGENTIN) 1 MG tablet  2 times daily     04/12/18 2129    divalproex (DEPAKOTE) 500 MG DR tablet  2 times daily     04/12/18 2129    OLANZapine (ZYPREXA) 10 MG tablet  2 times daily     04/12/18 2129    tamsulosin (FLOMAX) 0.4 MG CAPS capsule  Daily     04/12/18 2129           Jeannie Fend, PA-C 04/12/18 2145    Raeford Razor, MD 04/21/18 1327

## 2018-04-12 NOTE — Care Management (Signed)
ED CM was consulted concerning patient sister POA here for medication refills. Patient was seen in the ED 8/22 for medication refills as well. Recommendation at that time was to follow up with establishing care.   POA was asked to complete release of medical info form she refused.  POA tells me that [place]. POA states that patient uses [facility] on [address] CM contacted pharmacy with POA permission, pharmacy patient has refills on Depakote only. CM informed POA of this.  POA states that she has made appointment with PCP [place] [facility] [place] [facility] but unable to tell me the name of the PCP at this time. She also states, she will [facility]. Updated L. Cyndie MullMurphy PA-C. No further CM needs identified

## 2018-04-12 NOTE — Progress Notes (Signed)
CSW consulted RNCM for medication needs.   CSW signing off.   Montine CircleKelsy Esmond Hinch, Silverio LayLCSWA [facility]  [phone]

## 2018-04-12 NOTE — ED Triage Notes (Signed)
Pt here with legal guardian for Depakote, Zyprexa, Cogentin, and Tamsulosin medication refills. Hx of bipolar disorder. Denies pain and any other sx.

## 2018-04-12 NOTE — ED Notes (Signed)
Patient able to ambulate independently  

## 2018-04-16 LAB — URINE CULTURE: Culture: >=100000 — AB

## 2018-04-17 ENCOUNTER — Telehealth: Payer: Self-pay | Admitting: *Deleted

## 2018-04-17 LAB — CULTURE, BLOOD (ROUTINE X 2): Culture: NO GROWTH

## 2018-04-17 NOTE — Telephone Encounter (Signed)
Post ED Visit - Positive Culture Follow-up  Culture report reviewed by antimicrobial stewardship pharmacist:  []  Enzo BiNathan Batchelder, Pharm.D. []  Celedonio MiyamotoJeremy Frens, Pharm.D., BCPS AQ-ID []  Garvin FilaMike Maccia, Pharm.D., BCPS []  Georgina PillionElizabeth Martin, [address] BCPS []  CapitanMinh Pham, [address]Ph[address]D., BCPS, AAHIVP []  Estella HuskMichelle Turner, Pharm.D., BCPS, AAHIVP [x]  Lysle Pearlachel Rumbarger, PharmD, BCPS []  Phillips Climeshuy Dang, PharmD, BCPS []  Agapito GamesAlison Masters, PharmD, BCPS []  Verlan FriendsErin Deja, PharmD  Positive urine culture Treated with Cephalexin, organism sensitive to the same and no further patient follow-up is required at this time.  Virl AxeRobertson, Breyton Vanscyoc [facility] 04/17/2018, 10:07 AM

## 2018-04-29 ENCOUNTER — Encounter (HOSPITAL_COMMUNITY): Payer: Self-pay

## 2018-04-29 ENCOUNTER — Inpatient Hospital Stay (HOSPITAL_COMMUNITY): Payer: Medicare Other

## 2018-04-29 ENCOUNTER — Inpatient Hospital Stay (HOSPITAL_COMMUNITY)
Admission: EM | Admit: 2018-04-29 | Discharge: 2018-06-03 | DRG: 853 | Disposition: A | Discharge status: Skilled Nursing Facility | Payer: Medicare Other | Attending: Internal Medicine | Admitting: Internal Medicine

## 2018-04-29 ENCOUNTER — Emergency Department (HOSPITAL_COMMUNITY): Payer: Medicare Other

## 2018-04-29 ENCOUNTER — Other Ambulatory Visit: Payer: Self-pay

## 2018-04-29 DIAGNOSIS — E870 Hyperosmolality and hypernatremia: Secondary | ICD-10-CM | POA: Diagnosis not present

## 2018-04-29 DIAGNOSIS — Z978 Presence of other specified devices: Secondary | ICD-10-CM

## 2018-04-29 DIAGNOSIS — D6489 Other specified anemias: Secondary | ICD-10-CM | POA: Diagnosis present

## 2018-04-29 DIAGNOSIS — K565 Intestinal adhesions [bands], unspecified as to partial versus complete obstruction: Secondary | ICD-10-CM | POA: Diagnosis present

## 2018-04-29 DIAGNOSIS — N400 Enlarged prostate without lower urinary tract symptoms: Secondary | ICD-10-CM | POA: Diagnosis present

## 2018-04-29 DIAGNOSIS — Z23 Encounter for immunization: Secondary | ICD-10-CM

## 2018-04-29 DIAGNOSIS — R059 Cough, unspecified: Secondary | ICD-10-CM

## 2018-04-29 DIAGNOSIS — A419 Sepsis, unspecified organism: Secondary | ICD-10-CM | POA: Diagnosis not present

## 2018-04-29 DIAGNOSIS — R64 Cachexia: Secondary | ICD-10-CM | POA: Diagnosis present

## 2018-04-29 DIAGNOSIS — A4159 Other Gram-negative sepsis: Secondary | ICD-10-CM | POA: Diagnosis present

## 2018-04-29 DIAGNOSIS — I10 Essential (primary) hypertension: Secondary | ICD-10-CM | POA: Diagnosis not present

## 2018-04-29 DIAGNOSIS — N390 Urinary tract infection, site not specified: Secondary | ICD-10-CM | POA: Diagnosis present

## 2018-04-29 DIAGNOSIS — Z515 Encounter for palliative care: Secondary | ICD-10-CM | POA: Diagnosis not present

## 2018-04-29 DIAGNOSIS — E875 Hyperkalemia: Secondary | ICD-10-CM | POA: Diagnosis not present

## 2018-04-29 DIAGNOSIS — E43 Unspecified severe protein-calorie malnutrition: Secondary | ICD-10-CM | POA: Diagnosis present

## 2018-04-29 DIAGNOSIS — D638 Anemia in other chronic diseases classified elsewhere: Secondary | ICD-10-CM | POA: Diagnosis present

## 2018-04-29 DIAGNOSIS — E871 Hypo-osmolality and hyponatremia: Secondary | ICD-10-CM | POA: Diagnosis not present

## 2018-04-29 DIAGNOSIS — Z681 Body mass index (BMI) 19 or less, adult: Secondary | ICD-10-CM | POA: Diagnosis not present

## 2018-04-29 DIAGNOSIS — Z8249 Family history of ischemic heart disease and other diseases of the circulatory system: Secondary | ICD-10-CM

## 2018-04-29 DIAGNOSIS — R627 Adult failure to thrive: Secondary | ICD-10-CM | POA: Diagnosis not present

## 2018-04-29 DIAGNOSIS — K5900 Constipation, unspecified: Secondary | ICD-10-CM | POA: Diagnosis not present

## 2018-04-29 DIAGNOSIS — Z9049 Acquired absence of other specified parts of digestive tract: Secondary | ICD-10-CM

## 2018-04-29 DIAGNOSIS — E876 Hypokalemia: Secondary | ICD-10-CM | POA: Diagnosis present

## 2018-04-29 DIAGNOSIS — N179 Acute kidney failure, unspecified: Secondary | ICD-10-CM | POA: Diagnosis present

## 2018-04-29 DIAGNOSIS — Z7189 Other specified counseling: Secondary | ICD-10-CM | POA: Diagnosis not present

## 2018-04-29 DIAGNOSIS — Z841 Family history of disorders of kidney and ureter: Secondary | ICD-10-CM

## 2018-04-29 DIAGNOSIS — R1310 Dysphagia, unspecified: Secondary | ICD-10-CM | POA: Diagnosis not present

## 2018-04-29 DIAGNOSIS — K56609 Unspecified intestinal obstruction, unspecified as to partial versus complete obstruction: Secondary | ICD-10-CM | POA: Diagnosis not present

## 2018-04-29 DIAGNOSIS — Z8042 Family history of malignant neoplasm of prostate: Secondary | ICD-10-CM

## 2018-04-29 DIAGNOSIS — R0602 Shortness of breath: Secondary | ICD-10-CM | POA: Diagnosis not present

## 2018-04-29 DIAGNOSIS — R109 Unspecified abdominal pain: Secondary | ICD-10-CM

## 2018-04-29 DIAGNOSIS — Z79899 Other long term (current) drug therapy: Secondary | ICD-10-CM

## 2018-04-29 DIAGNOSIS — F209 Schizophrenia, unspecified: Secondary | ICD-10-CM | POA: Diagnosis present

## 2018-04-29 DIAGNOSIS — Z7401 Bed confinement status: Secondary | ICD-10-CM

## 2018-04-29 DIAGNOSIS — R6520 Severe sepsis without septic shock: Secondary | ICD-10-CM | POA: Diagnosis present

## 2018-04-29 DIAGNOSIS — K567 Ileus, unspecified: Secondary | ICD-10-CM | POA: Diagnosis not present

## 2018-04-29 DIAGNOSIS — F0390 Unspecified dementia without behavioral disturbance: Secondary | ICD-10-CM | POA: Diagnosis present

## 2018-04-29 DIAGNOSIS — Z0189 Encounter for other specified special examinations: Secondary | ICD-10-CM

## 2018-04-29 DIAGNOSIS — R05 Cough: Secondary | ICD-10-CM

## 2018-04-29 DIAGNOSIS — G9341 Metabolic encephalopathy: Secondary | ICD-10-CM | POA: Diagnosis present

## 2018-04-29 DIAGNOSIS — I959 Hypotension, unspecified: Secondary | ICD-10-CM

## 2018-04-29 DIAGNOSIS — J690 Pneumonitis due to inhalation of food and vomit: Secondary | ICD-10-CM | POA: Diagnosis present

## 2018-04-29 DIAGNOSIS — Z87891 Personal history of nicotine dependence: Secondary | ICD-10-CM

## 2018-04-29 DIAGNOSIS — Z8701 Personal history of pneumonia (recurrent): Secondary | ICD-10-CM

## 2018-04-29 DIAGNOSIS — Z833 Family history of diabetes mellitus: Secondary | ICD-10-CM

## 2018-04-29 DIAGNOSIS — K9189 Other postprocedural complications and disorders of digestive system: Secondary | ICD-10-CM | POA: Diagnosis not present

## 2018-04-29 DIAGNOSIS — F419 Anxiety disorder, unspecified: Secondary | ICD-10-CM | POA: Diagnosis present

## 2018-04-29 DIAGNOSIS — R55 Syncope and collapse: Secondary | ICD-10-CM | POA: Diagnosis present

## 2018-04-29 DIAGNOSIS — Z87820 Personal history of traumatic brain injury: Secondary | ICD-10-CM

## 2018-04-29 DIAGNOSIS — Z8719 Personal history of other diseases of the digestive system: Secondary | ICD-10-CM

## 2018-04-29 DIAGNOSIS — E860 Dehydration: Secondary | ICD-10-CM | POA: Diagnosis present

## 2018-04-29 DIAGNOSIS — Z993 Dependence on wheelchair: Secondary | ICD-10-CM

## 2018-04-29 DIAGNOSIS — Z902 Acquired absence of lung [part of]: Secondary | ICD-10-CM

## 2018-04-29 DIAGNOSIS — F319 Bipolar disorder, unspecified: Secondary | ICD-10-CM | POA: Diagnosis present

## 2018-04-29 DIAGNOSIS — J189 Pneumonia, unspecified organism: Secondary | ICD-10-CM

## 2018-04-29 DIAGNOSIS — D649 Anemia, unspecified: Secondary | ICD-10-CM | POA: Diagnosis present

## 2018-04-29 DIAGNOSIS — Z801 Family history of malignant neoplasm of trachea, bronchus and lung: Secondary | ICD-10-CM

## 2018-04-29 DIAGNOSIS — Z800 Family history of malignant neoplasm of digestive organs: Secondary | ICD-10-CM

## 2018-04-29 DIAGNOSIS — R1110 Vomiting, unspecified: Secondary | ICD-10-CM

## 2018-04-29 DIAGNOSIS — R112 Nausea with vomiting, unspecified: Secondary | ICD-10-CM

## 2018-04-29 HISTORY — DX: Pneumonia, unspecified organism: J18.9

## 2018-04-29 HISTORY — DX: Schizophrenia, unspecified: F20.9

## 2018-04-29 HISTORY — DX: Family history of other specified conditions: Z84.89

## 2018-04-29 HISTORY — DX: Personal history of other medical treatment: Z92.89

## 2018-04-29 HISTORY — DX: Anxiety disorder, unspecified: F41.9

## 2018-04-29 LAB — URINALYSIS, ROUTINE W REFLEX MICROSCOPIC
Glucose, UA: NEGATIVE mg/dL
KETONES UR: 15 mg/dL — AB
NITRITE: POSITIVE — AB
PH: 6.5 (ref 5.0–8.0)
Protein, ur: 30 mg/dL — AB
Specific Gravity, Urine: 1.02 (ref 1.005–1.030)

## 2018-04-29 LAB — POC OCCULT BLOOD, ED: FECAL OCCULT BLD: POSITIVE — AB

## 2018-04-29 LAB — CBC WITH DIFFERENTIAL/PLATELET
BASOS ABS: 0 10*3/uL (ref 0.0–0.1)
Basophils Relative: 0 %
EOS ABS: 0 10*3/uL (ref 0.0–0.7)
EOS PCT: 0 %
HEMATOCRIT: 35.9 % — AB (ref 39.0–52.0)
Hemoglobin: 11.1 g/dL — ABNORMAL LOW (ref 13.0–17.0)
LYMPHS ABS: 1.1 10*3/uL (ref 0.7–4.0)
Lymphocytes Relative: 8 %
MCH: 29.8 pg (ref 26.0–34.0)
MCHC: 30.9 g/dL (ref 30.0–36.0)
MCV: 96.5 fL (ref 78.0–100.0)
MONO ABS: 1.4 10*3/uL — AB (ref 0.1–1.0)
Monocytes Relative: 10 %
Neutro Abs: 11.4 10*3/uL — ABNORMAL HIGH (ref 1.7–7.7)
Neutrophils Relative %: 82 %
Platelets: 330 10*3/uL (ref 150–400)
RBC: 3.72 MIL/uL — AB (ref 4.22–5.81)
RDW: 15.7 % — AB (ref 11.5–15.5)
WBC: 13.9 10*3/uL — AB (ref 4.0–10.5)

## 2018-04-29 LAB — COMPREHENSIVE METABOLIC PANEL
ALT: 13 U/L (ref 0–44)
AST: 27 U/L (ref 15–41)
Albumin: 3.5 g/dL (ref 3.5–5.0)
Alkaline Phosphatase: 75 U/L (ref 38–126)
Anion gap: 24 — ABNORMAL HIGH (ref 5–15)
BILIRUBIN TOTAL: 0.3 mg/dL (ref 0.3–1.2)
BUN: 63 mg/dL — ABNORMAL HIGH (ref 8–23)
CALCIUM: 10 mg/dL (ref 8.9–10.3)
CHLORIDE: 91 mmol/L — AB (ref 98–111)
CO2: 28 mmol/L (ref 22–32)
CREATININE: 2.4 mg/dL — AB (ref 0.61–1.24)
GFR, EST AFRICAN AMERICAN: 31 mL/min — AB (ref >60)
GFR, EST NON AFRICAN AMERICAN: 27 mL/min — AB (ref >60)
Glucose, Bld: 125 mg/dL — ABNORMAL HIGH (ref 70–99)
Potassium: 3.5 mmol/L (ref 3.5–5.1)
Sodium: 143 mmol/L (ref 135–145)
TOTAL PROTEIN: 7.9 g/dL (ref 6.5–8.1)

## 2018-04-29 LAB — I-STAT CHEM 8, ED
BUN: 62 mg/dL — AB (ref 8–23)
CALCIUM ION: 1.05 mmol/L — AB (ref 1.15–1.40)
CHLORIDE: 95 mmol/L — AB (ref 98–111)
Creatinine, Ser: 2.2 mg/dL — ABNORMAL HIGH (ref 0.61–1.24)
Glucose, Bld: 129 mg/dL — ABNORMAL HIGH (ref 70–99)
HCT: 40 % (ref 39.0–52.0)
Hemoglobin: 13.6 g/dL (ref 13.0–17.0)
POTASSIUM: 3.4 mmol/L — AB (ref 3.5–5.1)
SODIUM: 140 mmol/L (ref 135–145)
TCO2: 32 mmol/L (ref 22–32)

## 2018-04-29 LAB — URINALYSIS, MICROSCOPIC (REFLEX): WBC, UA: >50 WBC/hpf (ref 0–5)

## 2018-04-29 LAB — PROTIME-INR
INR: 1.07
Prothrombin Time: 13.8 seconds (ref 11.4–15.2)

## 2018-04-29 LAB — RAPID HIV SCREEN (HIV 1/2 AB+AG)
HIV 1/2 ANTIBODIES: NONREACTIVE
HIV-1 P24 ANTIGEN - HIV24: NONREACTIVE

## 2018-04-29 LAB — VALPROIC ACID LEVEL: Valproic Acid Lvl: 86 ug/mL (ref 50.0–100.0)

## 2018-04-29 LAB — RAPID URINE DRUG SCREEN, HOSP PERFORMED
Amphetamines: NOT DETECTED
BARBITURATES: NOT DETECTED
Benzodiazepines: NOT DETECTED
COCAINE: NOT DETECTED
Opiates: NOT DETECTED
TETRAHYDROCANNABINOL: NOT DETECTED

## 2018-04-29 LAB — I-STAT TROPONIN, ED: TROPONIN I, POC: 0 ng/mL (ref 0.00–0.08)

## 2018-04-29 LAB — LIPASE, BLOOD: Lipase: 204 U/L — ABNORMAL HIGH (ref 11–51)

## 2018-04-29 LAB — ABO/RH: ABO/RH(D): O POS

## 2018-04-29 LAB — ETHANOL

## 2018-04-29 LAB — I-STAT CG4 LACTIC ACID, ED: LACTIC ACID, VENOUS: 1.07 mmol/L (ref 0.5–1.9)

## 2018-04-29 MED ORDER — HYDROCORTISONE NA SUCCINATE PF 100 MG IJ SOLR
100.0000 mg | INTRAMUSCULAR | Status: AC
  Administered 2018-04-29: 100 mg via INTRAVENOUS
  Filled 2018-04-29: qty 2

## 2018-04-29 MED ORDER — INFLUENZA VAC SPLIT HIGH-DOSE 0.5 ML IM SUSY
0.5000 mL | PREFILLED_SYRINGE | INTRAMUSCULAR | Status: AC
  Administered 2018-05-01: 0.5 mL via INTRAMUSCULAR
  Filled 2018-04-29: qty 0.5

## 2018-04-29 MED ORDER — SODIUM CHLORIDE 0.9 % IV BOLUS
1000.0000 mL | Freq: Once | INTRAVENOUS | Status: AC
  Administered 2018-04-29: 1000 mL via INTRAVENOUS

## 2018-04-29 MED ORDER — SODIUM CHLORIDE 0.9 % IV SOLN: 1.0000 g | INTRAVENOUS | Status: AC

## 2018-04-29 MED ORDER — SODIUM CHLORIDE 0.9 % IV SOLN
INTRAVENOUS | Status: DC
  Administered 2018-04-29 (×2): via INTRAVENOUS

## 2018-04-29 MED ORDER — SODIUM CHLORIDE 0.9 % IV SOLN
1.0000 g | Freq: Once | INTRAVENOUS | Status: AC
  Administered 2018-04-29: 1 g via INTRAVENOUS
  Filled 2018-04-29: qty 10

## 2018-04-29 MED ORDER — HEPARIN SODIUM (PORCINE) 5000 UNIT/ML IJ SOLN
5000.0000 [IU] | Freq: Three times a day (TID) | INTRAMUSCULAR | Status: DC
  Administered 2018-04-29 – 2018-05-08 (×28): 5000 [IU] via SUBCUTANEOUS
  Filled 2018-04-29 (×28): qty 1

## 2018-04-29 MED ORDER — ALBUMIN HUMAN 25 % IV SOLN
12.5000 g | Freq: Once | INTRAVENOUS | Status: AC
  Administered 2018-04-29: 12.5 g via INTRAVENOUS
  Filled 2018-04-29 (×2): qty 50

## 2018-04-29 MED ORDER — VALPROATE SODIUM 500 MG/5ML IV SOLN: 500.0000 mg | Freq: Two times a day (BID) | INTRAVENOUS | Status: DC

## 2018-04-29 MED ORDER — ALBUTEROL SULFATE (2.5 MG/3ML) 0.083% IN NEBU
2.5000 mg | INHALATION_SOLUTION | RESPIRATORY_TRACT | Status: DC | PRN
  Administered 2018-05-03 – 2018-05-13 (×4): 2.5 mg via RESPIRATORY_TRACT
  Filled 2018-04-29 (×4): qty 3

## 2018-04-29 MED ORDER — SODIUM CHLORIDE 0.9 % IV SOLN
2.0000 g | INTRAVENOUS | Status: DC
  Administered 2018-04-30 – 2018-05-06 (×7): 2 g via INTRAVENOUS
  Filled 2018-04-29 (×3): qty 20
  Filled 2018-04-29: qty 2
  Filled 2018-04-29 (×3): qty 20

## 2018-04-29 NOTE — H&P (Signed)
                                                                                                        TR[facility] H&P   Patient Demographics:    Jeffrey Davila, is a 67 y.o. male  MRN: [id number]   DOB - 09/09/1950  Admit Date - 04/29/2018  Outpatient Primary MD for the patient is System, Pcp Not In  Referring MD/NP/PA: Dr Messick  Patient coming from:   Chief Complaint  Patient presents with  . Altered Mental Status      HPI:    Jeffrey Davila  is a 67 y.o. male, past medical history of schizophrenia, hypertension, BPH, who lives with his sister at home, for last 15 years, he is wheelchair dependent at baseline, but able to transfer from bed to chair, communicative and appropriate per sister, she reports over the last few days patient has been less interactive, sleeping more often during the day, and over the last 2 days almost with no significant oral intake, which prompted her to come to ED. -ED he was noted to be hypothermic at 92, with leukocytosis of 13 K, with worsening creatinine at 2.2, hypokalemia, after Foley catheter insertion purulent urine came out from Foley, patient was just recently treated for Klebsiella pneumonia, was hypotensive but did respond to fluid bolus, I was called to admit.    Review of systems:    Patient is lethargic, unable to provide any review of systems   With Past History of the following :    Past Medical History:  Diagnosis Date  . Anemia   . Bipolar disorder (HCC)   . Dementia (HCC)   . Depression   . Hypertension       Past Surgical History:  Procedure Laterality Date  . BRAIN SURGERY     age 9  . SMALL INTESTINE SURGERY        Social History:     Social History   Tobacco Use  . Smoking status: Former Smoker    Types: Cigarettes    Last attempt to quit: 07/27/2004    Years since quitting: 13.7  . Smokeless tobacco: Never Used  Substance Use Topics  . Alcohol use: No     Lives -home with sister  Mobility -wheelchair  dependent     Family History :     Family History  Problem Relation Age of Onset  . Colon cancer Father   . Prostate cancer Father   . Lung cancer Father   . Colon cancer Brother   . Diabetes Mother   . Hypertension Mother   . Diabetes Sister   . Kidney disease Brother   . Hypertension Sister        x 2      Home Medications:   Prior to Admission medications   Medication Sig Start Date End Date Taking? Authorizing Provider  benztropine (COGENTIN) 1 MG tablet Take 1 tablet (1 mg total) by mouth 2 (two) times daily for 14 days. 04/12/18 04/29/18 Yes Murphy, Laura A, PA-C  divalproex (  DEPAKOTE) 500 MG DR tablet Take 1 tablet (500 mg total) by mouth 2 (two) times daily for 14 days. 04/12/18 04/29/18 Yes Tacy Learn, PA-C  ibuprofen (ADVIL,MOTRIN) 800 MG tablet Take 800 mg by mouth as needed.   Yes [provider]  metoprolol tartrate (LOPRESSOR) 25 MG tablet Take 25 mg by mouth daily.  02/27/13  Yes [provider]  OLANZapine (ZYPREXA) 10 MG tablet Take 1 tablet (10 mg total) by mouth 2 (two) times daily for 14 days. 04/12/18 04/29/18 Yes Tacy Learn, PA-C  tamsulosin (FLOMAX) 0.4 MG CAPS capsule Take 0.4 mg by mouth daily.   Yes [provider]  Na Sulfate-K Sulfate-Mg Sulf ([place]) SOLN USE PER PREP INSTRUCTIONS Patient not taking: Reported on 04/29/2018 02/28/13   Sable Feil, MD     Allergies:    No Known Allergies   Physical Exam:   Vitals  Blood pressure 95/69, pulse (!) 104, temperature (!) 96.4 F (35.8 C), resp. rate 12, height 5' 6" (1.676 m), weight 49.9 kg, SpO2 100 %.   1. General namely frail, cachectic thin appearing male, with temper and wasting, less lethargic in bed.  2.  Is lethargic, responds to painful stimuli, but able to withdraw all extremities  3.  Pons to painful stimuli, withdrawal all extremities,  4. Ears and Eyes appear Normal, Conjunctivae clear, PERRLA.  Dry oral mucosa  5. Supple Neck, No  JVD, No cervical lymphadenopathy appriciated, No Carotid Bruits.  6. Symmetrical Chest wall movement, Good air movement bilaterally, CTAB.  7. RRR, No Gallops, Rubs or Murmurs, No Parasternal Heave.  8. Positive Bowel Sounds, Abdomen Soft, No tenderness, No organomegaly appriciated,No rebound -guarding or rigidity.  9.  No Cyanosis, Normal Skin Turgor, No Skin Rash or Bruise.  10.  Significant muscle wasting, joints appear normal , no effusions, Normal ROM.  11. No Palpable Lymph Nodes in Neck or Axillae    Data Review:    CBC Recent Labs  Lab 04/29/18 0904 04/29/18 0930  WBC 13.9*  --   HGB 11.1* 13.6  HCT 35.9* 40.0  PLT 330  --   MCV 96.5  --   MCH 29.8  --   MCHC 30.9  --   RDW 15.7*  --   LYMPHSABS 1.1  --   MONOABS 1.4*  --   EOSABS 0.0  --   BASOSABS 0.0  --    ------------------------------------------------------------------------------------------------------------------  Chemistries  Recent Labs  Lab 04/29/18 0904 04/29/18 0930  NA 143 140  K 3.5 3.4*  CL 91* 95*  CO2 28  --   GLUCOSE 125* 129*  BUN 63* 62*  CREATININE 2.40* 2.20*  CALCIUM 10.0  --   AST 27  --   ALT 13  --   ALKPHOS 75  --   BILITOT 0.3  --    ------------------------------------------------------------------------------------------------------------------ estimated creatinine clearance is 23.3 mL/min (A) (by C-G formula based on SCr of 2.2 mg/dL (H)). ------------------------------------------------------------------------------------------------------------------ No results for input(s): TSH, T4TOTAL, T3FREE, THYROIDAB in the last 72 hours.  Invalid input(s): FREET3  Coagulation profile Recent Labs  Lab 04/29/18 0904  INR 1.07   ------------------------------------------------------------------------------------------------------------------- No results for input(s): DDIMER in the last 72  hours. -------------------------------------------------------------------------------------------------------------------  Cardiac Enzymes No results for input(s): CKMB, TROPONINI, MYOGLOBIN in the last 168 hours.  Invalid input(s): CK ------------------------------------------------------------------------------------------------------------------ No results found for: BNP   ---------------------------------------------------------------------------------------------------------------  Urinalysis    Component Value Date/Time   COLORURINE AMBER (A) 04/29/2018 1059   APPEARANCEUR  CLOUDY (A) 04/29/2018 1059   LABSPEC 1.020 04/29/2018 1059   PHURINE 6.5 04/29/2018 1059   GLUCOSEU NEGATIVE 04/29/2018 1059   HGBUR LARGE (A) 04/29/2018 1059   BILIRUBINUR MODERATE (A) 04/29/2018 1059   KETONESUR 15 (A) 04/29/2018 1059   PROTEINUR 30 (A) 04/29/2018 1059   UROBILINOGEN 1.0 06/08/2010 1523   NITRITE POSITIVE (A) 04/29/2018 1059   LEUKOCYTESUR MODERATE (A) 04/29/2018 1059    ----------------------------------------------------------------------------------------------------------------   Imaging Results:    Ct Head Wo Contrast  Result Date: 04/29/2018 CLINICAL DATA:  Altered level of consciousness increased over last 2 days, 2 episodes of nausea and vomiting, history of prior brain injury, dementia, former smoker, hypertension EXAM: CT HEAD WITHOUT CONTRAST TECHNIQUE: Contiguous axial images were obtained from the base of the skull through the vertex without intravenous contrast. COMPARISON:  06/08/2010 FINDINGS: Brain: Generalized atrophy. Normal ventricular morphology. No midline shift or mass effect. Small vessel chronic ischemic changes of deep cerebral white matter. 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 No intracranial hemorrhage, mass lesion, evidence of acute infarction, or extra-axial fluid collection. Vascular: Minimal atherosclerotic calcification of internal carotid  arteries at skull base Skull: Intact Sinuses/Orbits: Clear Other: N/A IMPRESSION: Atrophy with small vessel chronic ischemic changes of deep cerebral white matter. 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 No acute intracranial abnormalities. Electronically Signed   By: Lavonia Dana M.D.   On: 04/29/2018 09:58   Dg Chest Port 1 View  Result Date: 04/29/2018 CLINICAL DATA:  Altered mental status EXAM: PORTABLE CHEST 1 VIEW COMPARISON:  April 12, 2018 FINDINGS: There is mild scarring in the lateral right base and in the medial right lower lung region. There is no edema or consolidation. The heart size and pulmonary vascularity are normal. No adenopathy. There is a recent appearing fracture of the posterior right seventh rib with alignment essentially anatomic. An older fracture of the posterior right eighth rib is noted. No pneumothorax. IMPRESSION: Recent appearing fracture posterior right seventh rib. Older fracture posterior right eighth rib noted. Areas of mild scarring noted on the right. No edema or consolidation. Stable cardiac silhouette. Electronically Signed   By: Lowella Grip III M.D.   On: 04/29/2018 08:46    My personal review of EKG: Rhythm NSR, Rate  103 /min, QTc 465, no Acute ST changes   Assessment & Plan:    Active Problems:   Sepsis ([place])   Sepsis -Source is most likely related to UTI, still awaiting urine analysis, but his urine is purulent after Foley catheter insertion, chest x-ray with no evidence of infection, as well abdominal exam is benign. -Continue Rocephin 2 g IV daily, follow urine cultures and blood cultures -Sepsis criteria met on admission hypothermic, hypotensive, with altered mental status and leukocytosis  -Pressure remains soft, will admit to stepdown, give another fluid bolus, give 1 dose of albumin, and 1 dose of hydrocortisone 100 mg IV once  Hypothermia -Continue with warming blanket  Metabolic encephalopathy -Per sister patient  is conversant, communicative and appropriate, currently is lethargic, this is most likely in the setting of infectious process, CT head with no acute findings  History of hypertension -hold meds as blood pressure is soft  History of schizophrenia - Continue to hold home meds as unable to take oral due to lethargy  Protein calorie malnutrition -Patient extremely frail and cachectic, will consult nutritionist  AKI -in The setting of dehydration and volume depletion, continue with IV fluids  DVT Prophylaxis Heparin - SCDs   AM  Labs Ordered, also please review Full Orders  Family Communication: Admission, patients condition and plan of care including tests being ordered have been discussed with sister, who indicate understanding and agree with the plan and Code Status.  Code Status Full  Likely DC to  Home with sister  Condition GUARDED    Consults called: none  Admission status: inpatient  Time spent in minutes : 65 minutes     M.D on 04/29/2018 at 12:34 PM  Between 7am to 7pm - Pager - [phone]. After 7pm go to [email or web address] - password [email or web address]  [facility] - Office  [phone]     

## 2018-04-29 NOTE — ED Provider Notes (Signed)
[facility] EMERGENCY DEPARTMENT Provider Note   CSN: [id number] Arrival date & time: 04/29/18  0807     History   Chief Complaint Chief Complaint  Patient presents with  . Altered Mental Status    HPI DAUNDRE BIEL is a 67 y.o. male.  67 year old male with prior medical history as documented below presents with complaint of syncope and weakness.  Majority of history is obtained from the patient's sister who is his primary caregiver at home.  Patient is not a reliable historian.  Patient reportedly with decreased p.o. intake over the last 48 to 72 hours.  Sister reports that the patient had a syncopal event this morning while being made.  He was unresponsive for approximately 5 minutes.  There was no witnessed seizure-like activity.  Following a syncopal event the patient's mental status slowly returned to normal.  Upon my evaluation he is back to his baseline.  The sister reports that he occasionally will induce his own emesis.  She has noted that he has done this more frequently over the last 48 hours.  She reports that the emesis appears to be brown in color.  She denies any obvious blood in the emesis.  She denies witnessing blood in the patient's stool.  The history is provided by the patient, medical records and a relative.  Loss of Consciousness   This is a new problem. The current episode started 1 to 2 hours ago. The problem occurs rarely. The problem has not changed since onset.He lost consciousness for a period of 1 to 5 minutes. The problem is associated with normal activity. Associated symptoms include weakness. He has tried nothing for the symptoms.    Past Medical History:  Diagnosis Date  . Anemia   . Bipolar disorder (HCC)   . Dementia   . Depression   . Hypertension     There are no active problems to display for this patient.   Past Surgical History:  Procedure Laterality Date  . BRAIN SURGERY     age 53  . SMALL INTESTINE SURGERY           Home Medications    Prior to Admission medications   Medication Sig Start Date End Date Taking? Authorizing Provider  benztropine (COGENTIN) 1 MG tablet Take 1 tablet (1 mg total) by mouth 2 (two) times daily for 14 days. 04/12/18 04/26/18  Jeannie Fend, PA-C  divalproex (DEPAKOTE) 500 MG DR tablet Take 1 tablet (500 mg total) by mouth 2 (two) times daily for 14 days. 04/12/18 04/26/18  Army Melia A, PA-C  metoprolol tartrate (LOPRESSOR) 25 MG tablet Take 25 mg by mouth daily.  02/27/13   [provider]  Na Sulfate-K Sulfate-Mg Sulf (SUPREP BOWEL PREP) SOLN USE PER PREP INSTRUCTIONS 02/28/13   Mardella Layman, MD  OLANZapine (ZYPREXA) 10 MG tablet Take 1 tablet (10 mg total) by mouth 2 (two) times daily for 14 days. 04/12/18 04/26/18  Jeannie Fend, PA-C    Family History Family History  Problem Relation Age of Onset  . Colon cancer Father   . Prostate cancer Father   . Lung cancer Father   . Colon cancer Brother   . Diabetes Mother   . Hypertension Mother   . Diabetes Sister   . Kidney disease Brother   . Hypertension Sister        x 2    Social History Social History   Tobacco Use  . Smoking status: Former Smoker  Types: Cigarettes    Last attempt to quit: 07/27/2004    Years since quitting: 13.7  . Smokeless tobacco: Never Used  Substance Use Topics  . Alcohol use: No  . Drug use: No     Allergies   Patient has no known allergies.   Review of Systems Review of Systems  Cardiovascular: Positive for syncope.  Neurological: Positive for weakness.  All other systems reviewed and are negative.    Physical Exam Updated Vital Signs There were no vitals taken for this visit.  Physical Exam  Constitutional: He appears well-developed. No distress.  Ill appearing   Cachetic   Cool extremities   HENT:  Head: Normocephalic and atraumatic.  Mouth/Throat: Oropharynx is clear and moist.  Eyes: Pupils are equal, round, and reactive to  light. Conjunctivae and EOM are normal.  Neck: Normal range of motion. Neck supple.  Cardiovascular: Normal rate, regular rhythm and normal heart sounds.  Pulmonary/Chest: Effort normal and breath sounds normal. No respiratory distress.  Abdominal: Soft. He exhibits no distension. There is no tenderness.  Musculoskeletal: Normal range of motion. He exhibits no edema or deformity.  Neurological: He is alert.  Alert   Non verbal   Moves all 4 extremities   Skin: Skin is warm and dry.  Psychiatric: He has a normal mood and affect.  Nursing note and vitals reviewed.    ED Treatments / Results  Labs (all labs ordered are listed, but only abnormal results are displayed) Labs Reviewed  URINALYSIS, ROUTINE W REFLEX MICROSCOPIC - Abnormal; Notable for the following components:      Result Value   Color, Urine AMBER (*)    APPearance CLOUDY (*)    Hgb urine dipstick LARGE (*)    Bilirubin Urine MODERATE (*)    Ketones, ur 15 (*)    Protein, ur 30 (*)    Nitrite POSITIVE (*)    Leukocytes, UA MODERATE (*)    All other components within normal limits  LIPASE, BLOOD - Abnormal; Notable for the following components:   Lipase 204 (*)    All other components within normal limits  COMPREHENSIVE METABOLIC PANEL - Abnormal; Notable for the following components:   Chloride 91 (*)    Glucose, Bld 125 (*)    BUN 63 (*)    Creatinine, Ser 2.40 (*)    GFR calc non Af Amer 27 (*)    GFR calc Af Amer 31 (*)    Anion gap 24 (*)    All other components within normal limits  CBC WITH DIFFERENTIAL/PLATELET - Abnormal; Notable for the following components:   WBC 13.9 (*)    RBC 3.72 (*)    Hemoglobin 11.1 (*)    HCT 35.9 (*)    RDW 15.7 (*)    Neutro Abs 11.4 (*)    Monocytes Absolute 1.4 (*)    All other components within normal limits  URINALYSIS, MICROSCOPIC (REFLEX) - Abnormal; Notable for the following components:   Bacteria, UA MANY (*)    Non Squamous Epithelial PRESENT (*)    All  other components within normal limits  I-STAT CHEM 8, ED - Abnormal; Notable for the following components:   Potassium 3.4 (*)    Chloride 95 (*)    BUN 62 (*)    Creatinine, Ser 2.20 (*)    Glucose, Bld 129 (*)    Calcium, Ion 1.05 (*)    All other components within normal limits  CULTURE, BLOOD (ROUTINE X 2)  CULTURE, BLOOD (  ROUTINE X 2)  URINE CULTURE  ETHANOL  PROTIME-INR  VALPROIC ACID LEVEL  RAPID URINE DRUG SCREEN, HOSP PERFORMED  RAPID HIV SCREEN (HIV 1/2 AB+AG)  I-STAT TROPONIN, ED  CBG MONITORING, ED  POC OCCULT BLOOD, ED  I-STAT CG4 LACTIC ACID, ED  I-STAT CG4 LACTIC ACID, ED  TYPE AND SCREEN  ABO/RH    EKG EKG Interpretation  Date/Time:  Friday April 29 2018 08:45:59 EDT Ventricular Rate:  103 PR Interval:    QRS Duration: 88 QT Interval:  355 QTC Calculation: 465 R Axis:   73 Text Interpretation:  Sinus tachycardia Probable LVH with secondary repol abnrm Repol abnrm suggests ischemia, diffuse leads Baseline wander in lead(s) I III aVR aVL aVF V3 Confirmed by Kristine Royal [phone]) on 04/29/2018 8:50:40 AM Also confirmed by Kristine Royal [phone]), editor Elita Quick ([id number])  on 04/29/2018 8:55:23 AM   Radiology Ct Head Wo Contrast  Result Date: 04/29/2018 CLINICAL DATA:  Altered level of consciousness increased over last 2 days, 2 episodes of nausea and vomiting, history of prior brain injury, dementia, former smoker, hypertension EXAM: CT HEAD WITHOUT CONTRAST TECHNIQUE: Contiguous axial images were obtained from the base of the skull through the vertex without intravenous contrast. COMPARISON:  06/08/2010 FINDINGS: Brain: Generalized atrophy. Normal ventricular morphology. No midline shift or mass effect. Small vessel chronic ischemic changes of deep cerebral white matter. 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 No intracranial hemorrhage, mass lesion, evidence of acute infarction, or extra-axial fluid collection. Vascular: Minimal atherosclerotic  calcification of internal carotid arteries at skull base Skull: Intact Sinuses/Orbits: Clear Other: N/A IMPRESSION: Atrophy with small vessel chronic ischemic changes of deep cerebral white matter. 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 No acute intracranial abnormalities. Electronically Signed   By: Ulyses Southward M.D.   On: 04/29/2018 09:58   Dg Chest Port 1 View  Result Date: 04/29/2018 CLINICAL DATA:  Altered mental status EXAM: PORTABLE CHEST 1 VIEW COMPARISON:  April 12, 2018 FINDINGS: There is mild scarring in the lateral right base and in the medial right lower lung region. There is no edema or consolidation. The heart size and pulmonary vascularity are normal. No adenopathy. There is a recent appearing fracture of the posterior right seventh rib with alignment essentially anatomic. An older fracture of the posterior right eighth rib is noted. No pneumothorax. IMPRESSION: Recent appearing fracture posterior right seventh rib. Older fracture posterior right eighth rib noted. Areas of mild scarring noted on the right. No edema or consolidation. Stable cardiac silhouette. Electronically Signed   By: Bretta Bang III M.D.   On: 04/29/2018 08:46    Procedures Procedures (including critical care time)  Medications Ordered in ED Medications  sodium chloride 0.9 % bolus 1,000 mL (has no administration in time range)     Initial Impression / Assessment and Plan / ED Course  I have reviewed the triage vital signs and the nursing notes.  Pertinent labs & imaging results that were available during my care of the patient were reviewed by me and considered in my medical decision making (see chart for details).     MDM  Screen complete  Patient is presenting for evaluation of reported syncope event.  Exam suggest the patient is dehydrated.  Screening labs confirm this with concurrent evidence of a likely UTI.  Patient will require IV fluids and admission for IV antibiotics.   Hospitalist service is aware of case and will evaluate for admission.  Final Clinical Impressions(s) / ED  Diagnoses   Final diagnoses:  Hypotension, unspecified hypotension type  AKI (acute kidney injury) (HCC)  Urinary tract infection without hematuria, site unspecified    ED Discharge Orders    None       Wynetta Fines, MD 04/29/18 1210

## 2018-04-29 NOTE — Progress Notes (Signed)
PT came from the ED at approx 13:30.  Pt had brown stool, coming from his rectum and his mouth.  Pt's only word is "dooky".  Pt's sister is at bedside who sometimes states pt is at his baseline, but then adds that pt is weaker than normal with a decreased appetite since yesterday or a few days ago.  Pt is very thin with a low BMI.  Pt will move his arms and his legs, but limited ROM.

## 2018-04-29 NOTE — ED Notes (Signed)
Pt is resting in bed, appears to be comfortable, still sinus tach at 104, BP 99/67. NAD at this time. Core temp is now in normal range. Pt has received 3L of warm NS and antibiotics complete.

## 2018-04-29 NOTE — ED Notes (Signed)
Admitting came in room and ordered a 3rd bolus of fluids. Dr Rodena Medin also stated to start the antibiotics, he is aware that blood cultures have not been drawn due to pt being extremely hard stick.

## 2018-04-29 NOTE — ED Notes (Signed)
This RN still unable to obtain all of blood work. Pt was bladder scanned and in bladder. Dr Rodena Medin notified of blood draw difficulty. Pt has 1 IV that was placed by IV team and has already received 1L of warmed NS, pt's mental status seems to be improved since completion of 1st bolus. VSS.

## 2018-04-29 NOTE — ED Notes (Signed)
IV team at bedside 

## 2018-04-29 NOTE — ED Notes (Signed)
Pt complaining of abd pain. Dr Rodena Medin made aware. Also aware that sister states that pt has had 2 episodes of vomiting that appeared to be stool over the last 2 days.

## 2018-04-29 NOTE — Evaluation (Signed)
Physical Therapy Evaluation Patient Details Name: Jeffrey Davila MRN: [id number] DOB: 08/07/1950 Today's Date: 04/29/2018   History of Present Illness  Pt is a 67 y/o male admitted secondary to AMS and Sepsis most likely secondary to UTI. CT negative for acute abnormality. PMH inlcudes BI, dementia, HTN, and schizophrenia.   Clinical Impression  Pt admitted secondary to problem above with deficits below. Pt with very little muscle tone, weakness, and was resistive to movement of extremities. Could follow simple one step commands to move LEs/UEs, however, required increased time to follow. Pt requiring total A to roll to the side. Pt's sister reporting conflicting information about PLOF (see below), so unsure of true PLOF. Anticipate pt will require increased assist at d/c and would benefit from SNF. Will continue to follow acutely to maximize functional mobility independence and safety.     Follow Up Recommendations SNF;Supervision/Assistance - 24 hour    Equipment Recommendations  None recommended by PT    Recommendations for Other Services       Precautions / Restrictions Precautions Precautions: Fall;Other (comment) Precaution Comments: NG tube Restrictions Weight Bearing Restrictions: No      Mobility  Bed Mobility Overal bed mobility: Needs Assistance Bed Mobility: Rolling Rolling: Total assist         General bed mobility comments: Total assist to roll to L. Pt resisitive to mobility, therefore further mobility deferred.   Transfers                    Ambulation/Gait                Stairs            Wheelchair Mobility    Modified Rankin (Stroke Patients Only)       Balance                                             Pertinent Vitals/Pain Pain Assessment: Faces Faces Pain Scale: Hurts a little bit Pain Location: grimacing with LUE movement  Pain Descriptors / Indicators: Grimacing Pain Intervention(s):  Monitored during session;Limited activity within patient's tolerance;Repositioned    Home Living Family/patient expects to be discharged to:: Private residence Living Arrangements: Other relatives(sister ) Available Help at Discharge: Family;Available 24 hours/day Type of Home: House Home Access: Level entry     Home Layout: One level Home Equipment: Wheelchair - manual;Shower seat      Prior Function Level of Independence: Needs assistance   Gait / Transfers Assistance Needed: Pt's sister reporting differing information about PLOF. Initially reports pt can walk, however, then reported he used WC. With follow up questions about [place] of WC, pt's sister then reports pt can walk.   ADL's / Homemaking Assistance Needed: Pt's sister reports she had to help with tub transfers and ADL tasks.         Hand Dominance        Extremity/Trunk Assessment   Upper Extremity Assessment Upper Extremity Assessment: Generalized weakness;Difficult to assess due to impaired cognition;RUE deficits/detail;LUE deficits/detail RUE Deficits / Details: Very resistive to AAROM at elbow. pt with very little muscle tone.  LUE Deficits / Details: Very resistive to AAROM at elbow. pt with very little muscle tone.     Lower Extremity Assessment Lower Extremity Assessment: RLE deficits/detail;LLE deficits/detail;Generalized weakness;Difficult to assess due to impaired cognition RLE Deficits / Details: Resistive to LE  movement, however, able to perform heel slide and ankle pump with multimodal cues. Very little muscle tone noted.  LLE Deficits / Details: Resistive to LE movement, however, able to perform heel slide and ankle pump with multimodal cues. Very little muscle tone noted.        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 Pt able to follow simple one step commands. Was only oriented to person.       General Comments General comments (skin integrity, edema, etc.): Pt's sister present in room during session.     Exercises General Exercises - Upper Extremity Elbow Flexion: AAROM;Both;10 reps;Supine Elbow Extension: AAROM;Both;10 reps;Supine General Exercises - Lower Extremity Ankle Circles/Pumps: AAROM;Both;10 reps;Supine Heel Slides: AAROM;Both;5 reps;Supine   Assessment/Plan    PT Assessment Patient needs continued PT services  PT Problem List Decreased strength;Decreased balance;Decreased mobility;Decreased cognition;Decreased knowledge of use of DME;Decreased safety awareness;Decreased knowledge of precautions       PT Treatment Interventions DME instruction;Gait training;Functional mobility training;Therapeutic activities;Therapeutic exercise;Balance training;Patient/family education;Wheelchair mobility training;Cognitive remediation    PT Goals (Current goals can be found in the Care Plan section)  Acute Rehab PT Goals Patient Stated Goal: for pt to go to a "rest home" per pt's sister PT Goal Formulation: With family Time For Goal Achievement: 05/13/18 Potential to Achieve Goals: Fair    Frequency Min 2X/week   Barriers to discharge        Co-evaluation               AM-PAC PT "6 Clicks" Daily Activity  Outcome Measure Difficulty turning over in bed (including adjusting bedclothes, sheets and blankets)?: Unable Difficulty moving from lying on back to sitting on the side of the bed? : Unable Difficulty sitting down on and standing up from a chair with arms (e.g., wheelchair, bedside commode, etc,.)?: Unable Help needed moving to and from a bed to chair (including a wheelchair)?: Total Help needed walking in hospital room?: Total Help needed climbing 3-5 steps with a railing? : Total 6 Click  Score: 6    End of Session   Activity Tolerance: Patient tolerated treatment well Patient left: in bed;with call bell/phone within reach;with nursing/sitter in room;with family/visitor present Nurse Communication: Mobility status PT Visit Diagnosis: Unsteadiness on feet (R26.81);Muscle weakness (generalized) (M62.81);Difficulty in walking, not elsewhere classified (R26.2)    Time: 1610-9604 PT Time Calculation (min) (ACUTE ONLY): 15 min   Charges:   PT Evaluation $PT Eval Moderate Complexity: 1 Mod          Gladys Damme, PT, DPT  Acute Rehabilitation Services  Pager: [phone] Office: [phone]   Lehman Prom 04/29/2018, 5:44 PM

## 2018-04-29 NOTE — Progress Notes (Signed)
Initial Nutrition Assessment  DOCUMENTATION CODES:   Severe malnutrition in context of acute illness/injury, Underweight  INTERVENTION:   -RD will follow for diet advancement and supplement as appropriate  NUTRITION DIAGNOSIS:   Severe Malnutrition related to acute illness(SBO) as evidenced by moderate muscle depletion, severe muscle depletion, moderate fat depletion, severe fat depletion, percent weight loss.  GOAL:   Patient will meet greater than or equal to 90% of their needs  MONITOR:   Diet advancement, Labs, Weight trends, Skin, I & O's  REASON FOR ASSESSMENT:   Consult, Malnutrition Screening Tool Assessment of nutrition requirement/status(malnutrition)  ASSESSMENT:    Jeffrey Davila  is a 67 y.o. male, past medical history of schizophrenia, hypertension, BPH, who lives with his sister at home, for last 15 years, he is wheelchair dependent at baseline, but able to transfer from bed to chair, communicative and appropriate per sister, she reports over the last few days patient has been less interactive, sleeping more often during the day, and over the last 2 days almost with no significant oral intake, which prompted her to come to ED.  Pt admitted with sepsis related to UTI and metabolic encephalopathy.  Case discussed with RN prior to visit, who reports NPO status and positive hemoccult. NGT conntected to low, intermittent suction.   Spoke with pt's sister at bedside, who is the primary caretaker of pt. She shares that pt generally has a good appetite, consuming 3 meals per day (Breakfast: oatmeal, eggs, fruit, Lunch and Dinner: meat, starch, and vegetable). He does not follow a specific diet at home. He is fairly mobile, pt able to move with assistance of a wheelchair. He has always been slender per her report.   Per pt's sister, pt's health declined over the past few days. Pt was hungry and wanted to eat, "but everything would come back up" whenever food was offered.  She also reports a large decline in activity level, as pt now mainly stays in the bed. Additionally, she estimates that pt has lost about 10-15#; his UBW is around130#, which he has maintained for years. Noted a 8.3% wt loss over the past month, which is significant for time frame. Pt's demeanor has changed as well; pt sister reports that pt is usually very gregarious and talkative, but contributed very little during conversation today.  Pt sister knowledgeable about rationale for NPO order. She has no further concerns of questions related to nutrition care plan at this time.   Given assessment, pt meets criteria for severe malnutrition in the setting of acute illness. Suspect malnutrition is more chronic in nature, however, given exam and history suspect malnutrition is more acute at this time.   Labs reviewed.   NUTRITION - FOCUSED PHYSICAL EXAM:    Most Recent Value  Orbital Region  Mild depletion  Upper Arm Region  Moderate depletion  Thoracic and Lumbar Region  Moderate depletion  Buccal Region  Mild depletion  Temple Region  Mild depletion  Clavicle Bone Region  Severe depletion  Clavicle and Acromion Bone Region  Moderate depletion  Scapular Bone Region  Moderate depletion  Dorsal Hand  Severe depletion  Patellar Region  Severe depletion  Anterior Thigh Region  Severe depletion  Posterior Calf Region  Severe depletion  Edema (RD Assessment)  None  Hair  Reviewed  Eyes  Reviewed  Mouth  Reviewed  Skin  Reviewed  Nails  Reviewed       Diet Order:   Diet Order  Diet NPO time specified  Diet effective now              EDUCATION NEEDS:   Education needs have been addressed  Skin:  Skin Assessment: Reviewed RN Assessment  Last BM:  PTA  Height:   Ht Readings from Last 1 Encounters:  04/29/18 5\' 6"  (1.676 m)    Weight:   Wt Readings from Last 1 Encounters:  04/29/18 49.9 kg    Ideal Body Weight:  64.5 kg  BMI:  Body mass index is 17.75  kg/m.  Estimated Nutritional Needs:   Kcal:  1550-1750  Protein:  85-100 grams  Fluid:  >1.5 L    Aanchal Cope A. Mayford Knife, RD, LDN, CDE Pager: [phone] After hours Pager: [phone]

## 2018-04-29 NOTE — ED Notes (Addendum)
Pt had 2 episodes of vomiting Jeffrey Davila thin vomit, has the smell of stool. Pt's mouth suctioned and cleaned. MD made aware.

## 2018-04-29 NOTE — ED Triage Notes (Signed)
Pt BIB [facility], pt's family states that pt has been more altered than normal over the last 2 days with 2 episodes of n/v that "appeared to look like stool" Pt has hx of brain injury and dementia and is confused at baseline. Pt is alert. EMS unable to palpate radial pulses or get a BP. Dr Rodena Medin this RN and EMT at bedside. Unable to obtain IV access.

## 2018-04-30 ENCOUNTER — Inpatient Hospital Stay (HOSPITAL_COMMUNITY): Payer: Medicare Other

## 2018-04-30 DIAGNOSIS — K5900 Constipation, unspecified: Secondary | ICD-10-CM

## 2018-04-30 DIAGNOSIS — N390 Urinary tract infection, site not specified: Secondary | ICD-10-CM

## 2018-04-30 LAB — CBC
HCT: 21.9 % — ABNORMAL LOW (ref 39.0–52.0)
HCT: 23.4 % — ABNORMAL LOW (ref 39.0–52.0)
Hemoglobin: 6.9 g/dL — CL (ref 13.0–17.0)
Hemoglobin: 7.5 g/dL — ABNORMAL LOW (ref 13.0–17.0)
MCH: 30.1 pg (ref 26.0–34.0)
MCH: 30.6 pg (ref 26.0–34.0)
MCHC: 31.5 g/dL (ref 30.0–36.0)
MCHC: 32.1 g/dL (ref 30.0–36.0)
MCV: 95.6 fL (ref 78.0–100.0)
MCV: 95.5 fL (ref 78.0–100.0)
PLATELETS: 256 10*3/uL (ref 150–400)
Platelets: 244 10*3/uL (ref 150–400)
RBC: 2.29 MIL/uL — ABNORMAL LOW (ref 4.22–5.81)
RBC: 2.45 MIL/uL — AB (ref 4.22–5.81)
RDW: 15.9 % — AB (ref 11.5–15.5)
RDW: 15.7 % — ABNORMAL HIGH (ref 11.5–15.5)
WBC: 10.9 10*3/uL — AB (ref 4.0–10.5)
WBC: 11.6 10*3/uL — ABNORMAL HIGH (ref 4.0–10.5)

## 2018-04-30 LAB — BASIC METABOLIC PANEL
ANION GAP: 13 (ref 5–15)
BUN: 43 mg/dL — ABNORMAL HIGH (ref 8–23)
CO2: 29 mmol/L (ref 22–32)
CREATININE: 1.33 mg/dL — AB (ref 0.61–1.24)
Calcium: 8.5 mg/dL — ABNORMAL LOW (ref 8.9–10.3)
Chloride: 107 mmol/L (ref 98–111)
GFR calc non Af Amer: 54 mL/min — ABNORMAL LOW (ref >60)
Glucose, Bld: 79 mg/dL (ref 70–99)
Potassium: 3.1 mmol/L — ABNORMAL LOW (ref 3.5–5.1)
Sodium: 149 mmol/L — ABNORMAL HIGH (ref 135–145)

## 2018-04-30 LAB — PREPARE RBC (CROSSMATCH)

## 2018-04-30 MED ORDER — VALPROIC ACID 250 MG/5ML PO SOLN
250.0000 mg | Freq: Four times a day (QID) | ORAL | Status: DC
  Filled 2018-04-30 (×4): qty 5

## 2018-04-30 MED ORDER — OLANZAPINE 10 MG PO TABS
10.0000 mg | ORAL_TABLET | Freq: Two times a day (BID) | ORAL | Status: DC
  Administered 2018-04-30 – 2018-05-02 (×3): 10 mg via ORAL
  Filled 2018-04-30 (×7): qty 1

## 2018-04-30 MED ORDER — BISACODYL 10 MG RE SUPP
10.0000 mg | Freq: Once | RECTAL | Status: AC
  Administered 2018-04-30: 10 mg via RECTAL
  Filled 2018-04-30: qty 1

## 2018-04-30 MED ORDER — FUROSEMIDE 10 MG/ML IJ SOLN
20.0000 mg | Freq: Once | INTRAMUSCULAR | Status: AC
  Administered 2018-04-30: 20 mg via INTRAVENOUS
  Filled 2018-04-30: qty 2

## 2018-04-30 MED ORDER — TAMSULOSIN HCL 0.4 MG PO CAPS
0.4000 mg | ORAL_CAPSULE | Freq: Every day | ORAL | Status: DC
  Administered 2018-04-30: 0.4 mg via ORAL
  Filled 2018-04-30 (×3): qty 1

## 2018-04-30 MED ORDER — DIVALPROEX SODIUM 250 MG PO DR TAB
500.0000 mg | DELAYED_RELEASE_TABLET | Freq: Two times a day (BID) | ORAL | Status: DC
  Administered 2018-04-30: 500 mg via ORAL
  Filled 2018-04-30 (×2): qty 2

## 2018-04-30 MED ORDER — BENZTROPINE MESYLATE 1 MG PO TABS
1.0000 mg | ORAL_TABLET | Freq: Two times a day (BID) | ORAL | Status: DC
  Administered 2018-04-30 – 2018-06-03 (×36): 1 mg via ORAL
  Filled 2018-04-30 (×52): qty 1

## 2018-04-30 MED ORDER — ACETAMINOPHEN 650 MG RE SUPP
650.0000 mg | Freq: Once | RECTAL | Status: AC
  Administered 2018-04-30: 650 mg via RECTAL
  Filled 2018-04-30: qty 1

## 2018-04-30 MED ORDER — DIPHENHYDRAMINE HCL 50 MG/ML IJ SOLN
25.0000 mg | Freq: Once | INTRAMUSCULAR | Status: AC
  Administered 2018-04-30: 25 mg via INTRAVENOUS
  Filled 2018-04-30: qty 1

## 2018-04-30 MED ORDER — SODIUM CHLORIDE 0.45 % IV SOLN
INTRAVENOUS | Status: DC
  Administered 2018-04-30 – 2018-05-01 (×5): via INTRAVENOUS

## 2018-04-30 MED ORDER — ACETAMINOPHEN 325 MG PO TABS: 650.0000 mg | ORAL_TABLET | Freq: Once | ORAL | Status: DC

## 2018-04-30 MED ORDER — SODIUM CHLORIDE 0.9% IV SOLUTION
Freq: Once | INTRAVENOUS | Status: AC
  Administered 2018-04-30: 20:00:00 via INTRAVENOUS

## 2018-04-30 MED ORDER — POTASSIUM CHLORIDE CRYS ER 20 MEQ PO TBCR
40.0000 meq | EXTENDED_RELEASE_TABLET | Freq: Once | ORAL | Status: AC
  Administered 2018-04-30: 40 meq via ORAL
  Filled 2018-04-30: qty 2

## 2018-04-30 NOTE — Progress Notes (Signed)
PROGRESS NOTE        PATIENT DETAILS Name: Jeffrey Davila Age: 67 y.o. Sex: male Date of Birth: [birth year] Admit Date: 04/29/2018 Admitting Physician Starleen Arms, MD PCP:Uba, Reuel Boom, MD  Brief Narrative: Patient is a 67 y.o. male with history of schizophrenia, hypertension, BPH-mostly wheelchair-bound-presented to the hospital with approximately 1 week history of intermittent vomiting, confusion for a few days prior to this hospital admission-presented with sepsis secondary to UTI, and significant constipation.  See below for further details  Subjective: Feels better-no vomiting with NG tube pain.  Apparently has had numerous BMs overnight.  Assessment/Plan: Sepsis secondary to complicated UTI: Sepsis physiology has improved, continue IV Rocephin-urine/blood cultures pending.  Continue to monitor closely  Vomiting: Suspect secondary to significant constipation-NG tube in place-x-ray abdomen this morning shows resolution of gastric distention-however still shows significant constipation.  Clamp NG tube-start clear liquids-Dulcolax suppository x1.  If no vomiting we will attempt to discontinue NG tube and see how he does.  If vomiting recurs in spite of bowel movements/resolution of constipation-may need further evaluation.  Acute metabolic encephalopathy: Secondary to sepsis, on admission he was very lethargic-however he is much more awake and alert-Per sister he is back to his usual baseline.  Schizophrenia: Resume antipsychotics-follow.  Acute kidney injury: Hemodynamically mediated-improved-creatinine down to 1.3 (2.4 on admission).  Hypernatremia: Change IV fluids to half-normal saline-starting clear liquids-recheck electrolytes tomorrow.  Etiology is thought to be secondary to vomiting/NG suction.  Hypokalemia: Replete and recheck  Hypertension: Continue to hold all antihypertensives-blood pressure stable but soft.  Severe malnutrition: Once  diet advanced further-we will start supplements.  Deconditioning/debility: Appears to be significantly cachectic-mostly bed to wheelchair bound at baseline-dependent on family for significant activities of daily living.  He appears to have significant amount of schizophrenia-not sure if he has some mental retardation/dementia as well.  DVT Prophylaxis: Prophylactic Heparin   Code Status: Full code   Family Communication: Sister at bedside (claims she is POA)  (Sister at bedside-asking if I was a Muslim, and has let me know that she works at a [employer])  Disposition Plan: Remain inpatient-not stable for discharge-we will plan on home health services or SNF on discharge depending on family preference.  Antimicrobial agents: Anti-infectives (From admission, onward)   Start     Dose/Rate Route Frequency Ordered Stop   04/30/18 1100  cefTRIAXone (ROCEPHIN) 2 g in sodium chloride 0.9 % 100 mL IVPB     2 g 200 mL/hr over 30 Minutes Intravenous Every 24 hours 04/29/18 1416     04/29/18 1430  cefTRIAXone (ROCEPHIN) 1 g in sodium chloride 0.9 % 100 mL IVPB     1 g 200 mL/hr over 30 Minutes Intravenous Every 24 hours 04/29/18 1416 04/30/18 1429   04/29/18 1130  cefTRIAXone (ROCEPHIN) 1 g in sodium chloride 0.9 % 100 mL IVPB     1 g 200 mL/hr over 30 Minutes Intravenous  Once 04/29/18 1118 04/29/18 1251      Procedures: None  CONSULTS:  None  Time spent: 35 minutes-Greater than 50% of this time was spent in counseling, explanation of diagnosis, planning of further management, and coordination of care.  MEDICATIONS: Scheduled Meds: . bisacodyl  10 mg Rectal Once  . heparin  5,000 Units Subcutaneous Q8H  . Influenza vac split quadrivalent PF  0.5 mL Intramuscular Tomorrow-1000   Continuous  Infusions: . sodium chloride    . cefTRIAXone (ROCEPHIN)  IV    . cefTRIAXone (ROCEPHIN)  IV     PRN Meds:.albuterol   PHYSICAL EXAM: Vital signs: Vitals:   04/30/18 0220 04/30/18 0506  04/30/18 0718 04/30/18 0754  BP: 95/70 92/63 91/62  (!) 91/59  Pulse: (!) 103 94 93 94  Resp: (!) 24 19 13 16   Temp: 98.1 F (36.7 C) 98.4 F (36.9 C)  98 F (36.7 C)  TempSrc:    Oral  SpO2: 93% 97% 100% 98%  Weight:      Height:       Filed Weights   04/29/18 0842  Weight: 49.9 kg   Body mass index is 17.75 kg/m.   General appearance :Awake, alert, not in any distress.  Chronically sick looking-appears very cachectic Eyes:Pink conjunctiva HEENT: Atraumatic and Normocephalic Neck: supple Resp:Good air entry bilaterally, no added sounds  CVS: S1 S2 regular, no murmurs.  GI: Bowel sounds present, Non tender and not distended with no gaurding, rigidity or rebound. Extremities: B/L Lower Ext shows no edema, both legs are warm to touch Neurology:  Speech slow but clear, moves all 4 extremities. Musculoskeletal:No digital cyanosis Skin:No Rash, warm and dry Wounds:N/A  I have personally reviewed following labs and imaging studies  LABORATORY DATA: CBC: Recent Labs  Lab 04/29/18 0904 04/29/18 0930  WBC 13.9*  --   NEUTROABS 11.4*  --   HGB 11.1* 13.6  HCT 35.9* 40.0  MCV 96.5  --   PLT 330  --     Basic Metabolic Panel: Recent Labs  Lab 04/29/18 0904 04/29/18 0930 04/30/18 0618  NA 143 140 149*  K 3.5 3.4* 3.1*  CL 91* 95* 107  CO2 28  --  29  GLUCOSE 125* 129* 79  BUN 63* 62* 43*  CREATININE 2.40* 2.20* 1.33*  CALCIUM 10.0  --  8.5*    GFR: Estimated Creatinine Clearance: 38.6 mL/min (A) (by C-G formula based on SCr of 1.33 mg/dL (H)).  Liver Function Tests: Recent Labs  Lab 04/29/18 0904  AST 27  ALT 13  ALKPHOS 75  BILITOT 0.3  PROT 7.9  ALBUMIN 3.5   Recent Labs  Lab 04/29/18 0904  LIPASE 204*   No results for input(s): AMMONIA in the last 168 hours.  Coagulation Profile: Recent Labs  Lab 04/29/18 0904  INR 1.07    Cardiac Enzymes: No results for input(s): CKTOTAL, CKMB, CKMBINDEX, TROPONINI in the last 168 hours.  BNP (last  3 results) No results for input(s): PROBNP in the last 8760 hours.  HbA1C: No results for input(s): HGBA1C in the last 72 hours.  CBG: No results for input(s): GLUCAP in the last 168 hours.  Lipid Profile: No results for input(s): CHOL, HDL, LDLCALC, TRIG, CHOLHDL, LDLDIRECT in the last 72 hours.  Thyroid Function Tests: No results for input(s): TSH, T4TOTAL, FREET4, T3FREE, THYROIDAB in the last 72 hours.  Anemia Panel: No results for input(s): VITAMINB12, FOLATE, FERRITIN, TIBC, IRON, RETICCTPCT in the last 72 hours.  Urine analysis:    Component Value Date/Time   COLORURINE AMBER (A) 04/29/2018 1059   APPEARANCEUR CLOUDY (A) 04/29/2018 1059   LABSPEC 1.020 04/29/2018 1059   PHURINE 6.5 04/29/2018 1059   GLUCOSEU NEGATIVE 04/29/2018 1059   HGBUR LARGE (A) 04/29/2018 1059   BILIRUBINUR MODERATE (A) 04/29/2018 1059   KETONESUR 15 (A) 04/29/2018 1059   PROTEINUR 30 (A) 04/29/2018 1059   UROBILINOGEN 1.0 06/08/2010 1523   NITRITE POSITIVE (A) 04/29/2018  1059   LEUKOCYTESUR MODERATE (A) 04/29/2018 1059    Sepsis Labs: Lactic Acid, Venous    Component Value Date/Time   LATICACIDVEN 1.07 04/29/2018 1334    MICROBIOLOGY: Recent Results (from the past 240 hour(s))  Culture, blood (routine x 2)     Status: None (Preliminary result)   Collection Time: 04/29/18  1:25 PM  Result Value Ref Range Status   Specimen Description BLOOD RIGHT ANTECUBITAL  Final   Special Requests   Final    BOTTLES DRAWN AEROBIC AND ANAEROBIC Blood Culture results may not be optimal due to an excessive volume of blood received in culture bottles   Culture   Final    NO GROWTH <12 HOURS Performed at [facility], [address], [place], [place] [address]    Report Status PENDING  Incomplete    RADIOLOGY STUDIES/RESULTS: Dg Chest 2 View  Result Date: 04/12/2018 CLINICAL DATA:  Fever of unknown origin. EXAM: CHEST - 2 VIEW COMPARISON:  None. FINDINGS: The heart size is within normal  limits. Ectasia of the thoracic aorta is noted. Pulmonary hyperinflation is seen, consistent with COPD. Mild scarring noted at right lung base as well as several old right rib fracture deformities. No evidence of pulmonary infiltrate, edema, or pleural effusion. IMPRESSION: COPD.  No active cardiopulmonary disease. Electronically Signed   By: Myles Rosenthal M.D.   On: 04/12/2018 19:16   Ct Head Wo Contrast  Result Date: 04/29/2018 CLINICAL DATA:  Altered level of consciousness increased over last 2 days, 2 episodes of nausea and vomiting, history of prior brain injury, dementia, former smoker, hypertension EXAM: CT HEAD WITHOUT CONTRAST TECHNIQUE: Contiguous axial images were obtained from the base of the skull through the vertex without intravenous contrast. COMPARISON:  06/08/2010 FINDINGS: Brain: Generalized atrophy. Normal ventricular morphology. No midline shift or mass effect. Small vessel chronic ischemic changes of deep cerebral white matter. 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 No intracranial hemorrhage, mass lesion, evidence of acute infarction, or extra-axial fluid collection. Vascular: Minimal atherosclerotic calcification of internal carotid arteries at skull base Skull: Intact Sinuses/Orbits: Clear Other: N/A IMPRESSION: Atrophy with small vessel chronic ischemic changes of deep cerebral white matter. 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 No acute intracranial abnormalities. Electronically Signed   By: Ulyses Southward M.D.   On: 04/29/2018 09:58   Dg Chest Port 1 View  Result Date: 04/29/2018 CLINICAL DATA:  Altered mental status EXAM: PORTABLE CHEST 1 VIEW COMPARISON:  April 12, 2018 FINDINGS: There is mild scarring in the lateral right base and in the medial right lower lung region. There is no edema or consolidation. The heart size and pulmonary vascularity are normal. No adenopathy. There is a recent appearing fracture of the posterior right seventh rib with  alignment essentially anatomic. An older fracture of the posterior right eighth rib is noted. No pneumothorax. IMPRESSION: Recent appearing fracture posterior right seventh rib. Older fracture posterior right eighth rib noted. Areas of mild scarring noted on the right. No edema or consolidation. Stable cardiac silhouette. Electronically Signed   By: Bretta Bang III M.D.   On: 04/29/2018 08:46   Dg Abd Portable 1v  Result Date: 04/30/2018 CLINICAL DATA:  67 year old male with vomiting. EXAM: PORTABLE ABDOMEN - 1 VIEW COMPARISON:  04/29/2018 abdominal radiographs. FINDINGS: Portable AP semi upright and supine views at 0847 hours. Enteric tube has been placed and the stomach now appears decompressed. NG tube side hole at the gastric body. Non obstructed bowel  gas pattern. Moderate volume of retained stool redemonstrated in the transverse colon and distal sigmoid/rectum. Negative visible lung bases. No pneumoperitoneum. Stable abdominal and pelvic visceral contours. Dystrophic calcification in the pelvis. Stable pelvic catheter, likely a Foley. No acute osseous abnormality identified. IMPRESSION: 1. NG tube in place with resolved gastric distention. 2. Non obstructed bowel gas pattern with moderate volume of retained stool. Electronically Signed   By: Odessa Fleming M.D.   On: 04/30/2018 09:06   Dg Abd Portable 1v  Result Date: 04/29/2018 CLINICAL DATA:  Nausea and vomiting EXAM: PORTABLE ABDOMEN - 1 VIEW COMPARISON:  None. FINDINGS: Gaseous distention of the stomach. Formed stool throughout the colon. No concerning mass effect or calcification. IMPRESSION: Prominent gaseous distension of the stomach. Constipated appearance. Electronically Signed   By: Marnee Spring M.D.   On: 04/29/2018 14:44     LOS: 1 day   Jeoffrey Massed, MD  [facility]  If 7PM-7AM, please contact night-coverage  Please page via [email or web address] [email or web address]-click on MD name and type text message  04/30/2018, 9:40 AM

## 2018-04-30 NOTE — Progress Notes (Signed)
Dr. Jerral Ralph notified pt had 2 episodes of green emesis verbal order to change diet to NPO, and  hook NG tube back to Low intermittent suction, Will continue to monitor.

## 2018-04-30 NOTE — Progress Notes (Signed)
CRITICAL VALUE ALERT  Critical Value:  hgb 6.9 Date & Time Notied:  04/30/2018 1024 Provider Notified: Dr. Lucrezia Starch notified   Orders Received/Actions taken: Repeat lab work ordered

## 2018-05-01 ENCOUNTER — Inpatient Hospital Stay (HOSPITAL_COMMUNITY): Payer: Medicare Other

## 2018-05-01 DIAGNOSIS — K56609 Unspecified intestinal obstruction, unspecified as to partial versus complete obstruction: Secondary | ICD-10-CM

## 2018-05-01 DIAGNOSIS — J690 Pneumonitis due to inhalation of food and vomit: Secondary | ICD-10-CM

## 2018-05-01 LAB — BPAM RBC
Blood Product Expiration Date: 201911022359
ISSUE DATE / TIME: 201910052002
Unit Type and Rh: 5100

## 2018-05-01 LAB — CBC
HCT: 28.4 % — ABNORMAL LOW (ref 39.0–52.0)
Hemoglobin: 9.1 g/dL — ABNORMAL LOW (ref 13.0–17.0)
MCH: 29.6 pg (ref 26.0–34.0)
MCHC: 32 g/dL (ref 30.0–36.0)
MCV: 92.5 fL (ref 78.0–100.0)
PLATELETS: 221 10*3/uL (ref 150–400)
RBC: 3.07 MIL/uL — ABNORMAL LOW (ref 4.22–5.81)
RDW: 16.2 % — ABNORMAL HIGH (ref 11.5–15.5)
WBC: 13.1 10*3/uL — AB (ref 4.0–10.5)

## 2018-05-01 LAB — TYPE AND SCREEN
ABO/RH(D): O POS
Antibody Screen: NEGATIVE
Unit division: 0

## 2018-05-01 LAB — URINE CULTURE

## 2018-05-01 LAB — BASIC METABOLIC PANEL
ANION GAP: 11 (ref 5–15)
BUN: 26 mg/dL — AB (ref 8–23)
CO2: 29 mmol/L (ref 22–32)
Calcium: 8.5 mg/dL — ABNORMAL LOW (ref 8.9–10.3)
Chloride: 105 mmol/L (ref 98–111)
Creatinine, Ser: 1.03 mg/dL (ref 0.61–1.24)
GFR calc Af Amer: >60 mL/min (ref >60)
Glucose, Bld: 79 mg/dL (ref 70–99)
POTASSIUM: 3.3 mmol/L — AB (ref 3.5–5.1)
SODIUM: 145 mmol/L (ref 135–145)

## 2018-05-01 MED ORDER — POTASSIUM CHLORIDE 10 MEQ/100ML IV SOLN
10.0000 meq | INTRAVENOUS | Status: AC
  Administered 2018-05-01 (×3): 10 meq via INTRAVENOUS
  Filled 2018-05-01 (×3): qty 100

## 2018-05-01 MED ORDER — VALPROATE SODIUM 500 MG/5ML IV SOLN
250.0000 mg | Freq: Four times a day (QID) | INTRAVENOUS | Status: DC
  Administered 2018-05-01 – 2018-06-03 (×128): 250 mg via INTRAVENOUS
  Filled 2018-05-01 (×142): qty 2.5

## 2018-05-01 MED ORDER — METRONIDAZOLE IN NACL 5-0.79 MG/ML-% IV SOLN
500.0000 mg | Freq: Three times a day (TID) | INTRAVENOUS | Status: DC
  Administered 2018-05-01 – 2018-05-06 (×14): 500 mg via INTRAVENOUS
  Filled 2018-05-01 (×14): qty 100

## 2018-05-01 MED ORDER — DIATRIZOATE MEGLUMINE & SODIUM 66-10 % PO SOLN
90.0000 mL | Freq: Once | ORAL | Status: AC
  Administered 2018-05-01: 90 mL via NASOGASTRIC
  Filled 2018-05-01: qty 90
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
## 2018-05-01 NOTE — Progress Notes (Addendum)
PROGRESS NOTE        PATIENT DETAILS Name: Jeffrey Davila Age: 67 y.o. Sex: male Date of Birth: 01-Sep-1950 Admit Date: 04/29/2018 Admitting Physician Starleen Arms, MD PCP:Uba, Reuel Boom, MD  Brief Narrative: Patient is a 67 y.o. male with history of schizophrenia, hypertension, BPH-mostly wheelchair-bound-presented to the hospital with approximately 1 week history of intermittent vomiting, confusion for a few days prior to this hospital admission-presented with sepsis secondary to UTI, vomiting and significant constipation.  Patient was started on IV antibiotics and admitted to the hospital service,initial imaging was suggestive of constipation causing gastric distention-NG tube was placed, however upon further evaluation with a CT scan he appears now to have a small bowel obstruction.  See below for further details.    Subjective: Developed emesis following clamping of NG tube yesterday-remains n.p.o.-NG tube in place.  Had BMs with laxatives yesterday.  Assessment/Plan: Sepsis secondary to complicated UTI: Sepsis physiology has definitely improved-urine culture positive for Klebsiella pneumonia-blood cultures currently pending.  Continue Rocephin.  Aspiration pneumonia: Appears to have developed infiltrates on chest x-ray-clinically has rales all over-high suspicion for aspiration given recurrent episodes of vomiting.  Already on Rocephin-add Flagyl-follow.  Once clinically improved with resolution of SBO-will require speech therapy evaluation.  SBO: Initially vomiting was thought to be secondary to UTI and constipation-however after numerous BMs following Dulcolax and other laxatives-continued to vomit when NG tube was clamped.  CT scan abdomen on 10/5 shows small bowel obstruction.  Continue IV fluids-n.p.o. status-General surgery consulted this morning.   Acute metabolic encephalopathy: Secondary to sepsis-he was very lethargic on admission-he is awake and  alert currently-suspect his clinically improved and back to his usual baseline.    Anemia: Significant drop in hemoglobin on 10/5-suspect hemoglobin on presentation was artificially elevated due to dehydration-no obvious source of bleeding-Per nursing staff stool is brown in color without any evidence of melena or hematochezia.  Transfused 1 unit of PRBC posttransfusion globin stable at 9.1 this morning.  Suspect anemia of critical illness-perhaps worsened by IV fluid dilution.  Continue to follow CBC for now.    Schizophrenia: On antipsychotics-may need to be held if n.p.o. status and vomiting.  Acute kidney injury: Hemodynamically mediated in the setting of vomiting and sepsis-has resolved with supportive care.    Hypernatremia: Secondary to vomiting-improved with half-normal saline.  Recheck electrolytes tomorrow.  Hypokalemia: Replete and recheck.  Hypertension: Blood pressure stable-continue to hold all antihypertensives.  Severe malnutrition: Once diet advanced-start antihypertensives.  Deconditioning/debility: Appears to be significantly cachectic-mostly bed to wheelchair bound at baseline-dependent on family for significant activities of daily living.  He appears to have significant amount of schizophrenia-not sure if he has some mental retardation/dementia as well.  DVT Prophylaxis: Prophylactic Heparin   Code Status: Full code   Family Communication: Sister at bedside (claims she is POA)   Disposition Plan: Remain inpatient-not stable for discharge-we will plan on home health services or SNF on discharge depending on family preference.  Antimicrobial agents: Anti-infectives (From admission, onward)   Start     Dose/Rate Route Frequency Ordered Stop   04/30/18 1100  cefTRIAXone (ROCEPHIN) 2 g in sodium chloride 0.9 % 100 mL IVPB     2 g 200 mL/hr over 30 Minutes Intravenous Every 24 hours 04/29/18 1416     04/29/18 1430  cefTRIAXone (ROCEPHIN) 1 g in sodium chloride 0.9  %  100 mL IVPB     1 g 200 mL/hr over 30 Minutes Intravenous Every 24 hours 04/29/18 1416 04/30/18 1429   04/29/18 1130  cefTRIAXone (ROCEPHIN) 1 g in sodium chloride 0.9 % 100 mL IVPB     1 g 200 mL/hr over 30 Minutes Intravenous  Once 04/29/18 1118 04/29/18 1251      Procedures: None  CONSULTS:  None  Time spent: 35 minutes-Greater than 50% of this time was spent in counseling, explanation of diagnosis, planning of further management, and coordination of care.  MEDICATIONS: Scheduled Meds: . benztropine  1 mg Oral BID  . diatrizoate meglumine-sodium  90 mL Per NG tube Once  . heparin  5,000 Units Subcutaneous Q8H  . Influenza vac split quadrivalent PF  0.5 mL Intramuscular Tomorrow-1000  . OLANZapine  10 mg Oral BID  . tamsulosin  0.4 mg Oral Daily  . valproic acid  250 mg Per Tube QID   Continuous Infusions: . sodium chloride 125 mL/hr at 05/01/18 0253  . cefTRIAXone (ROCEPHIN)  IV 2 g (04/30/18 1117)   PRN Meds:.albuterol   PHYSICAL EXAM: Vital signs: Vitals:   04/30/18 2222 05/01/18 0038 05/01/18 0431 05/01/18 0809  BP: 108/72 94/73  123/82  Pulse:   80   Resp: 14   13  Temp: 98.2 F (36.8 C) 98 F (36.7 C) 98.3 F (36.8 C) 98.3 F (36.8 C)  TempSrc: Oral Oral Oral Oral  SpO2:   100% 100%  Weight:      Height:       Filed Weights   04/29/18 0842  Weight: 49.9 kg   Body mass index is 17.75 kg/m.   General appearance:Awake, alert, not in any distress.  Eyes:no scleral icterus. HEENT: Atraumatic and Normocephalic Neck: supple, no JVD. Resp:Good air entry bilaterally,no rales or rhonchi CVS: S1 S2 regular, no murmurs.  GI: Bowel sounds sluggish-nontender nondistended.  Extremities: B/L Lower Ext shows no edema Neurology:  Non focal Psychiatric: Normal judgment and insight. Normal mood. Musculoskeletal:No digital cyanosis Skin:No Rash, warm and dry Wounds:N/A  I have personally reviewed following labs and imaging studies  LABORATORY  DATA: CBC: Recent Labs  Lab 04/29/18 0904 04/29/18 0930 04/30/18 0957 04/30/18 1055 05/01/18 0451  WBC 13.9*  --  10.9* 11.6* 13.1*  NEUTROABS 11.4*  --   --   --   --   HGB 11.1* 13.6 6.9* 7.5* 9.1*  HCT 35.9* 40.0 21.9* 23.4* 28.4*  MCV 96.5  --  95.6 95.5 92.5  PLT 330  --  244 256 221    Basic Metabolic Panel: Recent Labs  Lab 04/29/18 0904 04/29/18 0930 04/30/18 0618 05/01/18 0451  NA 143 140 149* 145  K 3.5 3.4* 3.1* 3.3*  CL 91* 95* 107 105  CO2 28  --  29 29  GLUCOSE 125* 129* 79 79  BUN 63* 62* 43* 26*  CREATININE 2.40* 2.20* 1.33* 1.03  CALCIUM 10.0  --  8.5* 8.5*    GFR: Estimated Creatinine Clearance: 49.8 mL/min (by C-G formula based on SCr of 1.03 mg/dL).  Liver Function Tests: Recent Labs  Lab 04/29/18 0904  AST 27  ALT 13  ALKPHOS 75  BILITOT 0.3  PROT 7.9  ALBUMIN 3.5   Recent Labs  Lab 04/29/18 0904  LIPASE 204*   No results for input(s): AMMONIA in the last 168 hours.  Coagulation Profile: Recent Labs  Lab 04/29/18 0904  INR 1.07    Cardiac Enzymes: No results for input(s): CKTOTAL, CKMB, CKMBINDEX,  TROPONINI in the last 168 hours.  BNP (last 3 results) No results for input(s): PROBNP in the last 8760 hours.  HbA1C: No results for input(s): HGBA1C in the last 72 hours.  CBG: No results for input(s): GLUCAP in the last 168 hours.  Lipid Profile: No results for input(s): CHOL, HDL, LDLCALC, TRIG, CHOLHDL, LDLDIRECT in the last 72 hours.  Thyroid Function Tests: No results for input(s): TSH, T4TOTAL, FREET4, T3FREE, THYROIDAB in the last 72 hours.  Anemia Panel: No results for input(s): VITAMINB12, FOLATE, FERRITIN, TIBC, IRON, RETICCTPCT in the last 72 hours.  Urine analysis:    Component Value Date/Time   COLORURINE AMBER (A) 04/29/2018 1059   APPEARANCEUR CLOUDY (A) 04/29/2018 1059   LABSPEC 1.020 04/29/2018 1059   PHURINE 6.5 04/29/2018 1059   GLUCOSEU NEGATIVE 04/29/2018 1059   HGBUR LARGE (A) 04/29/2018  1059   BILIRUBINUR MODERATE (A) 04/29/2018 1059   KETONESUR 15 (A) 04/29/2018 1059   PROTEINUR 30 (A) 04/29/2018 1059   UROBILINOGEN 1.0 06/08/2010 1523   NITRITE POSITIVE (A) 04/29/2018 1059   LEUKOCYTESUR MODERATE (A) 04/29/2018 1059    Sepsis Labs: Lactic Acid, Venous    Component Value Date/Time   LATICACIDVEN 1.07 04/29/2018 1334    MICROBIOLOGY: Recent Results (from the past 240 hour(s))  Culture, Urine     Status: Abnormal   Collection Time: 04/29/18 11:57 AM  Result Value Ref Range Status   Specimen Description URINE, RANDOM  Final   Special Requests   Final    NONE Performed at [facility], [address], [place], [place] [address]    Culture >=100,000 COLONIES/mL KLEBSIELLA PNEUMONIAE (A)  Final   Report Status 05/01/2018 FINAL  Final   Organism ID, Bacteria KLEBSIELLA PNEUMONIAE (A)  Final      Susceptibility   Klebsiella pneumoniae - MIC*    AMPICILLIN >=32 RESISTANT Resistant     CEFAZOLIN <=4 SENSITIVE Sensitive     CEFTRIAXONE <=1 SENSITIVE Sensitive     CIPROFLOXACIN <=0.25 SENSITIVE Sensitive     GENTAMICIN <=1 SENSITIVE Sensitive     IMIPENEM 0.5 SENSITIVE Sensitive     NITROFURANTOIN 64 INTERMEDIATE Intermediate     TRIMETH/SULFA <=20 SENSITIVE Sensitive     AMPICILLIN/SULBACTAM 16 INTERMEDIATE Intermediate     PIP/TAZO 16 SENSITIVE Sensitive     Extended ESBL NEGATIVE Sensitive     * >=100,000 COLONIES/mL KLEBSIELLA PNEUMONIAE  Culture, blood (routine x 2)     Status: None (Preliminary result)   Collection Time: 04/29/18  1:25 PM  Result Value Ref Range Status   Specimen Description BLOOD RIGHT ANTECUBITAL  Final   Special Requests   Final    BOTTLES DRAWN AEROBIC AND ANAEROBIC Blood Culture results may not be optimal due to an excessive volume of blood received in culture bottles   Culture   Final    NO GROWTH 1 DAY Performed at [facility], [address], [place], [place] [address]    Report Status PENDING  Incomplete     RADIOLOGY STUDIES/RESULTS: Ct Abdomen Pelvis Wo Contrast  Result Date: 04/30/2018 CLINICAL DATA:  Nausea, bilious vomiting EXAM: CT ABDOMEN AND PELVIS WITHOUT CONTRAST TECHNIQUE: Multidetector CT imaging of the abdomen and pelvis was performed following the standard protocol without IV contrast. Sagittal and coronal MPR images reconstructed from axial data set. Oral contrast was not administered. COMPARISON:  None FINDINGS: Lower chest: Emphysematous changes with BILATERAL lower lobe infiltrates consistent with either pneumonia or aspiration. Hepatobiliary: Liver unremarkable. Gallbladder not  well visualized due to streak artifacts, grossly unremarkable. Pancreas: Normal appearance Spleen: Normal appearance Adrenals/Urinary Tract: Question adrenal thickening bilaterally. No obvious renal mass or hydronephrosis. Ureters not visualized. Foley catheter decompresses urinary bladder. Stomach/Bowel: Nasogastric tube in stomach. Low-attenuation fluid distends stomach and multiple proximal small bowel loops, with small bowel loops up to 4.1 cm diameter. Distal small bowel loops and colon are decompressed. Scattered stool throughout colon. Findings likely represent mid small bowel obstruction. Appendix not visualized. Suspected rectal wall thickening. Vascular/Lymphatic: Atherosclerotic calcification aorta. Aorta normal caliber. Reproductive: Minimal prostatic enlargement and scattered prostatic calcifications. Other: No definite free air or free fluid. Nonspecific stranding of presacral fat. Musculoskeletal: No acute osseous findings. IMPRESSION: Dilated proximal and decompressed distal small bowel loops compatible with small bowel obstruction, likely in the mid small bowel. Due to lack of fat planes, streak artifacts, and lack of contrast, unable to localize site of obstruction. No evidence of perforation/free air. Question rectal wall thickening, recommend correlation with proctoscopy. Emphysematous changes with  bibasilar pulmonary infiltrates either representing pneumonia or aspiration. Electronically Signed   By: Ulyses Southward M.D.   On: 04/30/2018 19:38   Dg Chest 2 View  Result Date: 04/12/2018 CLINICAL DATA:  Fever of unknown origin. EXAM: CHEST - 2 VIEW COMPARISON:  None. FINDINGS: The heart size is within normal limits. Ectasia of the thoracic aorta is noted. Pulmonary hyperinflation is seen, consistent with COPD. Mild scarring noted at right lung base as well as several old right rib fracture deformities. No evidence of pulmonary infiltrate, edema, or pleural effusion. IMPRESSION: COPD.  No active cardiopulmonary disease. Electronically Signed   By: Myles Rosenthal M.D.   On: 04/12/2018 19:16   Ct Head Wo Contrast  Result Date: 04/29/2018 CLINICAL DATA:  Altered level of consciousness increased over last 2 days, 2 episodes of nausea and vomiting, history of prior brain injury, dementia, former smoker, hypertension EXAM: CT HEAD WITHOUT CONTRAST TECHNIQUE: Contiguous axial images were obtained from the base of the skull through the vertex without intravenous contrast. COMPARISON:  06/08/2010 FINDINGS: Brain: Generalized atrophy. Normal ventricular morphology. No midline shift or mass effect. Small vessel chronic ischemic changes of deep cerebral white matter. 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 No intracranial hemorrhage, mass lesion, evidence of acute infarction, or extra-axial fluid collection. Vascular: Minimal atherosclerotic calcification of internal carotid arteries at skull base Skull: Intact Sinuses/Orbits: Clear Other: N/A IMPRESSION: Atrophy with small vessel chronic ischemic changes of deep cerebral white matter. 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 No acute intracranial abnormalities. Electronically Signed   By: Ulyses Southward M.D.   On: 04/29/2018 09:58   Dg Chest Port 1 View  Result Date: 04/29/2018 CLINICAL DATA:  Altered mental status EXAM: PORTABLE CHEST 1 VIEW  COMPARISON:  April 12, 2018 FINDINGS: There is mild scarring in the lateral right base and in the medial right lower lung region. There is no edema or consolidation. The heart size and pulmonary vascularity are normal. No adenopathy. There is a recent appearing fracture of the posterior right seventh rib with alignment essentially anatomic. An older fracture of the posterior right eighth rib is noted. No pneumothorax. IMPRESSION: Recent appearing fracture posterior right seventh rib. Older fracture posterior right eighth rib noted. Areas of mild scarring noted on the right. No edema or consolidation. Stable cardiac silhouette. Electronically Signed   By: Bretta Bang III M.D.   On: 04/29/2018 08:46   Dg Chest Port 1v Same Day  Result Date: 05/01/2018 CLINICAL DATA:  history of schizophrenia, hypertension, BPH-mostly wheelchair-bound-presented to the hospital with approximately 1 week history of intermittent vomiting, confusion for a few days prior to this hospital admission-presented with sepsis secondary to UTI, and significant constipation. C/o SOB EXAM: PORTABLE CHEST - 1 VIEW SAME DAY COMPARISON:  04/29/2018 FINDINGS: Lungs are hyperinflated. Subsegmental atelectasis or early infiltrate at the left lung base, new since previous. Right lung clear. Heart size normal.  Tortuous ectatic thoracic aorta. No effusion. No pneumothorax. Right anterolateral fourth rib fracture, age indeterminate. Nasogastric tube extends to the stomach. IMPRESSION: 1. New patchy infiltrate or subsegmental atelectasis at the left lung base. 2. Right fourth rib fracture without pneumothorax. 3. Nasogastric tube placement to the stomach. Electronically Signed   By: Corlis Leak M.D.   On: 05/01/2018 08:24   Dg Abd Portable 1v-small Bowel Protocol-position Verification  Result Date: 05/01/2018 CLINICAL DATA:  Nasogastric tube placement EXAM: PORTABLE ABDOMEN - 1 VIEW COMPARISON:  Portable exam 1109 hours compared to 04/30/2018  FINDINGS: Tip of nasogastric tube projects over mid stomach. Dilated small bowel loops are seen in the LEFT mid abdomen. Increased stool in RIGHT colon. Lung bases appear emphysematous with subsegmental atelectasis at LEFT base. Bones demineralized. IMPRESSION: Tip of nasogastric tube projects over mid stomach. Small bowel dilatation in LEFT mid abdomen with increased stool in RIGHT colon. Electronically Signed   By: Ulyses Southward M.D.   On: 05/01/2018 11:22   Dg Abd Portable 1v  Result Date: 04/30/2018 CLINICAL DATA:  67 year old male with vomiting. EXAM: PORTABLE ABDOMEN - 1 VIEW COMPARISON:  04/29/2018 abdominal radiographs. FINDINGS: Portable AP semi upright and supine views at 0847 hours. Enteric tube has been placed and the stomach now appears decompressed. NG tube side hole at the gastric body. Non obstructed bowel gas pattern. Moderate volume of retained stool redemonstrated in the transverse colon and distal sigmoid/rectum. Negative visible lung bases. No pneumoperitoneum. Stable abdominal and pelvic visceral contours. Dystrophic calcification in the pelvis. Stable pelvic catheter, likely a Foley. No acute osseous abnormality identified. IMPRESSION: 1. NG tube in place with resolved gastric distention. 2. Non obstructed bowel gas pattern with moderate volume of retained stool. Electronically Signed   By: Odessa Fleming M.D.   On: 04/30/2018 09:06   Dg Abd Portable 1v  Result Date: 04/29/2018 CLINICAL DATA:  Nausea and vomiting EXAM: PORTABLE ABDOMEN - 1 VIEW COMPARISON:  None. FINDINGS: Gaseous distention of the stomach. Formed stool throughout the colon. No concerning mass effect or calcification. IMPRESSION: Prominent gaseous distension of the stomach. Constipated appearance. Electronically Signed   By: Marnee Spring M.D.   On: 04/29/2018 14:44     LOS: 2 days   Jeoffrey Massed, MD  [facility]  If 7PM-7AM, please contact night-coverage  Please page via [email or web address] [email or web address]-click  on MD name and type text message  05/01/2018, 11:52 AM

## 2018-05-02 ENCOUNTER — Inpatient Hospital Stay (HOSPITAL_COMMUNITY): Payer: Medicare Other

## 2018-05-02 DIAGNOSIS — E43 Unspecified severe protein-calorie malnutrition: Secondary | ICD-10-CM

## 2018-05-02 LAB — CBC
HCT: 25.8 % — ABNORMAL LOW (ref 39.0–52.0)
HEMOGLOBIN: 8.3 g/dL — AB (ref 13.0–17.0)
MCH: 30.1 pg (ref 26.0–34.0)
MCHC: 32.2 g/dL (ref 30.0–36.0)
MCV: 93.5 fL (ref 78.0–100.0)
Platelets: 269 10*3/uL (ref 150–400)
RBC: 2.76 MIL/uL — ABNORMAL LOW (ref 4.22–5.81)
RDW: 15.9 % — ABNORMAL HIGH (ref 11.5–15.5)
WBC: 12.9 10*3/uL — ABNORMAL HIGH (ref 4.0–10.5)

## 2018-05-02 LAB — BASIC METABOLIC PANEL
ANION GAP: 9 (ref 5–15)
BUN: 20 mg/dL (ref 8–23)
CHLORIDE: 105 mmol/L (ref 98–111)
CO2: 28 mmol/L (ref 22–32)
Calcium: 7.8 mg/dL — ABNORMAL LOW (ref 8.9–10.3)
Creatinine, Ser: 0.98 mg/dL (ref 0.61–1.24)
GFR calc Af Amer: >60 mL/min (ref >60)
GFR calc non Af Amer: >60 mL/min (ref >60)
Glucose, Bld: 81 mg/dL (ref 70–99)
Potassium: 2.8 mmol/L — ABNORMAL LOW (ref 3.5–5.1)
Sodium: 142 mmol/L (ref 135–145)

## 2018-05-02 MED ORDER — SODIUM CHLORIDE 0.9 % IV BOLUS
500.0000 mL | Freq: Once | INTRAVENOUS | Status: AC
  Administered 2018-05-02: 500 mL via INTRAVENOUS

## 2018-05-02 MED ORDER — DIPHENHYDRAMINE HCL 50 MG/ML IJ SOLN
25.0000 mg | Freq: Once | INTRAMUSCULAR | Status: AC
  Administered 2018-05-02: 25 mg via INTRAVENOUS
  Filled 2018-05-02: qty 1

## 2018-05-02 MED ORDER — SODIUM CHLORIDE 0.45 % IV SOLN
INTRAVENOUS | Status: DC
  Administered 2018-05-02: 14:00:00 via INTRAVENOUS
  Filled 2018-05-02 (×4): qty 1000

## 2018-05-02 MED ORDER — ONDANSETRON HCL 4 MG/2ML IJ SOLN
4.0000 mg | Freq: Four times a day (QID) | INTRAMUSCULAR | Status: DC | PRN
  Administered 2018-05-02 – 2018-06-03 (×2): 4 mg via INTRAVENOUS
  Filled 2018-05-02 (×2): qty 2

## 2018-05-02 NOTE — Progress Notes (Signed)
CC:SBO  Subjective: Pt barely speaks, congested when he does, NG working, his sister says he had a BM last PM.  Nothing recorded, but nurse confirms. Will recheck film and decide on clamping or pulling NG.   Objective: Vital signs in last 24 hours: Temp:  [97.3 F (36.3 C)-99.3 F (37.4 C)] 98.5 F (36.9 C) (10/07 0421) Pulse Rate:  [90-91] 91 (10/07 0159) Resp:  [14-21] 15 (10/07 0730) BP: (90-113)/(60-75) 113/72 (10/07 0730) SpO2:  [95 %-99 %] 95 % (10/07 0159) Last BM Date: 05/01/18 NG 1550 IV 2891 Urine 550 Afebrile, VSS WBC still UP anemia still an issue K+ 2.8 NO mag SB protocol contrast at 1218 yesterday 8 hour delay film:  Enteric tube in place with tip in the stomach, side-port just beyond the gastroesophageal junction. Improving small bowel dilatation in the left abdomen. Moderate stool in the right and transverse colon.  No evidence of free air. Pelvic calcification may be stone in the urinary bladder or exophytic prostate calcification.   Intake/Output from previous day: 10/06 0701 - 10/07 0700 In: 2891.3 [I.V.:2102.3; IV Piggyback:689] Out: 2100 [Urine:550; Emesis/NG output:1550] Intake/Output this shift: No intake/output data recorded.  General appearance: awake, but does not speak much.  Looking down most of exam. Resp: congeted with rales GI: soft, no distension, cachectic, no BS, + BM reported.    Lab Results:  Recent Labs    05/01/18 0451 05/02/18 0538  WBC 13.1* 12.9*  HGB 9.1* 8.3*  HCT 28.4* 25.8*  PLT 221 269    BMET Recent Labs    05/01/18 0451 05/02/18 0538  NA 145 142  K 3.3* 2.8*  CL 105 105  CO2 29 28  GLUCOSE 79 81  BUN 26* 20  CREATININE 1.03 0.98  CALCIUM 8.5* 7.8*   PT/INR No results for input(s): LABPROT, INR in the last 72 hours.  Recent Labs  Lab 04/29/18 0904  AST 27  ALT 13  ALKPHOS 75  BILITOT 0.3  PROT 7.9  ALBUMIN 3.5     Lipase     Component Value Date/Time   LIPASE 204 (H) 04/29/2018 0904      Medications: . benztropine  1 mg Oral BID  . heparin  5,000 Units Subcutaneous Q8H  . OLANZapine  10 mg Oral BID  . tamsulosin  0.4 mg Oral Daily   . cefTRIAXone (ROCEPHIN)  IV 2 g (05/02/18 1037)  . metronidazole 100 mL/hr at 05/02/18 0645  . sodium chloride 0.45 % 1,000 mL with potassium chloride 20 mEq infusion    . valproate sodium Stopped (05/02/18 0536)   Anti-infectives (From admission, onward)   Start     Dose/Rate Route Frequency Ordered Stop   05/01/18 1215  metroNIDAZOLE (FLAGYL) IVPB 500 mg     500 mg 100 mL/hr over 60 Minutes Intravenous Every 8 hours 05/01/18 1201     04/30/18 1100  cefTRIAXone (ROCEPHIN) 2 g in sodium chloride 0.9 % 100 mL IVPB     2 g 200 mL/hr over 30 Minutes Intravenous Every 24 hours 04/29/18 1416     04/29/18 1430  cefTRIAXone (ROCEPHIN) 1 g in sodium chloride 0.9 % 100 mL IVPB     1 g 200 mL/hr over 30 Minutes Intravenous Every 24 hours 04/29/18 1416 04/30/18 1429   04/29/18 1130  cefTRIAXone (ROCEPHIN) 1 g in sodium chloride 0.9 % 100 mL IVPB     1 g 200 mL/hr over 30 Minutes Intravenous  Once 04/29/18 1118 04/29/18 1251  Assessment/Plan Aspiration Pneumonia Acute metabolic encephalopathy Anemia AKI Hypertension Hypokalemia/Hyponatremia Hx of schizophrenia Deconditioning - bedbound Malnutrition - severe 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

## 2018-05-02 NOTE — Progress Notes (Addendum)
Physical Therapy Treatment Patient Details Name: Jeffrey Davila MRN: [id number] DOB: 1951-03-02 Today's Date: 05/02/2018    History of Present Illness Pt is a 67 y/o male admitted secondary to AMS and Sepsis most likely secondary to UTI. CT negative for acute abnormality. PMH inlcudes BI, dementia, HTN, and schizophrenia.     PT Comments    Patient received in bed, sister in room and assisted in encouraging patient to participate in session today. Able to complete functional bed mobility with ModA and functional transfers including stand-pivot to chair with MinA and bear hug method this morning. +2 assist required for line management and safety. He was left up in the chair with all needs met, sister present; RN present throughout session and aware of patient's mobility and ability to transfer. Sister appears pleased with patient's participation in session today, and appeared agreeable for him to return home with RW, hospital bed, and home aide; updated DC recommendation as such. He was left up in the chair with sister present and RN attending, all needs otherwise met.       Follow Up Recommendations  Home health PT;Supervision/Assistance - 24 hour     Equipment Recommendations  Rolling walker with 5" wheels;Hospital bed    Recommendations for Other Services       Precautions / Restrictions Precautions Precautions: Fall;Other (comment) Precaution Comments: NG tube Restrictions Weight Bearing Restrictions: No    Mobility  Bed Mobility Overal bed mobility: Needs Assistance Bed Mobility: Rolling;Sidelying to Sit Rolling: Mod assist Sidelying to sit: Mod assist       General bed mobility comments: ModA and Mod VC for cuing and safety, assist for line management   Transfers Overall transfer level: Needs assistance Equipment used: None Transfers: Sit to/from Stand;Stand Pivot Transfers Sit to Stand: Min assist Stand pivot transfers: Min assist       General transfer  comment: MinA for all functional transfers, cues for pivotal steps during stand-pivot transfer   Ambulation/Gait             General Gait Details: DNT due to fatigue/patient declining; able to take 3-4 pivotal steps during transfer to chair    Stairs             Wheelchair Mobility    Modified Rankin (Stroke Patients Only)       Balance Overall balance assessment: Needs assistance Sitting-balance support: Bilateral upper extremity supported;Feet supported Sitting balance-Leahy Scale: Good     Standing balance support: During functional activity Standing balance-Leahy Scale: Fair                              Cognition Arousal/Alertness: Awake/alert Behavior During Therapy: Flat affect Overall Cognitive Status: History of cognitive impairments - at baseline                                 General Comments: 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 Pt able to follow simple one step commands. Was only oriented to person.       Exercises      General Comments General comments (skin integrity, edema, etc.): sister in room during session       Pertinent Vitals/Pain Pain Assessment: Faces Pain Score: 0-No pain Faces Pain Scale: No hurt Pain Intervention(s): Limited activity within patient's tolerance;Monitored during session    Home Living  Prior Function            PT Goals (current goals can now be found in the care plan section) Acute Rehab PT Goals Patient Stated Goal: per sister to go home, get stronger  PT Goal Formulation: With family Time For Goal Achievement: 05/13/18 Potential to Achieve Goals: Fair Progress towards PT goals: Progressing toward goals    Frequency    Min 3X/week      PT Plan Discharge plan needs to be updated;Equipment recommendations need to be updated;Frequency needs to be updated    Co-evaluation              AM-PAC PT "6 Clicks" Daily Activity  Outcome  Measure  Difficulty turning over in bed (including adjusting bedclothes, sheets and blankets)?: A Lot Difficulty moving from lying on back to sitting on the side of the bed? : A Lot Difficulty sitting down on and standing up from a chair with arms (e.g., wheelchair, bedside commode, etc,.)?: A Little Help needed moving to and from a bed to chair (including a wheelchair)?: A Little Help needed walking in hospital room?: A Lot Help needed climbing 3-5 steps with a railing? : Total 6 Click Score: 13    End of Session Equipment Utilized During Treatment: Gait belt Activity Tolerance: Patient tolerated treatment well Patient left: in chair;with call bell/phone within reach;with family/visitor present;with nursing/sitter in room Nurse Communication: Mobility status PT Visit Diagnosis: Unsteadiness on feet (R26.81);Muscle weakness (generalized) (M62.81);Difficulty in walking, not elsewhere classified (R26.2)     Time: 1010-1040 PT Time Calculation (min) (ACUTE ONLY): 30 min  Charges:  $Therapeutic Activity: 23-37 mins                     Deniece Ree PT, DPT, CBIS  Supplemental Physical Therapist [place]    Pager [phone] Acute Rehab Office [phone]

## 2018-05-02 NOTE — Progress Notes (Signed)
PROGRESS NOTE        PATIENT DETAILS Name: Jeffrey Davila Age: 67 y.o. Sex: male Date of Birth: Feb 07, 1951 Admit Date: 04/29/2018 Admitting Physician Starleen Arms, MD PCP:Uba, Reuel Boom, MD  Brief Narrative: Patient is a 67 y.o. male with history of schizophrenia, hypertension, BPH-mostly wheelchair-bound-presented to the hospital with approximately 1 week history of intermittent vomiting, confusion for a few days prior to this hospital admission-presented with sepsis secondary to UTI, vomiting and significant constipation.  Patient was started on IV antibiotics and admitted to the hospital service,initial imaging was suggestive of constipation causing gastric distention-NG tube was placed, however upon further evaluation with a CT scan he appears now to have a small bowel obstruction.  See below for further details.    Subjective: Intermittent abdominal pain continues-per sister at bedside-patient had BM yesterday.  No chest pain or shortness of breath.  Assessment/Plan: Sepsis secondary to complicated UTI: Sepsis pathophysiology has resolved, urine culture positive for Klebsiella-blood cultures negative so far.  Continue IV Rocephin-transition to a oral antibiotic when he is no longer n.p.o. and diet is stable.    Aspiration pneumonia: Appears to have developed infiltrates on his most latest chest x-ray-he is improved-has less rales and transmitted upper airway sounds-continue Rocephin and Flagyl.  Once SBO resolves-he will need speech therapy evaluation.   SBO: Initially vomiting was attributed to UTI and constipation-with laxatives/Dulcolax patient had significant number of BMs-however he continued to vomit.Subsequently CT abdomen showed a small bowel obstruction.  NG tube remains in place-he remains n.p.o.-General surgery planning a small bile protocol x-ray today.  Acute metabolic encephalopathy: Secondary to sepsis-he is much improved and back to his usual  baseline.  Patient's sister at bedside-he was very lethargic and much more confused than usual.     Anemia: Significant drop in hemoglobin on 10/5-suspect hemoglobin on presentation was artificially elevated due to dehydration-suspect drop in hemoglobin is secondary to acute illness/IV fluid dilution-he was transfused 1 unit of PRBC-hemoglobin is currently stable but downtrending again.  There is no evidence of any overt bleeding.  Follow CBC.    Schizophrenia: On antipsychotics-we will resume once he is not n.p.o.  Acute kidney injury: Hemodynamically mediated in the setting of vomiting and sepsis-has resolved with supportive care.    Hypernatremia: Resolved-continue half-normal saline.  Follow electrolytes  Hypokalemia: Secondary to GI loss/NG tube suctioning-replete and recheck.   Hypertension: Blood pressure stable-continue to hold all antihypertensives.    Severe malnutrition: Once diet advanced-start supplements.  Deconditioning/debility: Appears to be significantly cachectic-mostly bed to wheelchair bound at baseline-dependent on family for significant activities of daily living.  He appears to have significant amount of schizophrenia-not sure if he has some mental retardation/dementia as well.  DVT Prophylaxis: Prophylactic Heparin   Code Status: Full code   Family Communication: Sister at bedside (claims she is POA)  Disposition Plan: Remain inpatient-not stable for discharge-we will plan on home health services or SNF on discharge depending on family preference.  Antimicrobial agents: Anti-infectives (From admission, onward)   Start     Dose/Rate Route Frequency Ordered Stop   05/01/18 1215  metroNIDAZOLE (FLAGYL) IVPB 500 mg     500 mg 100 mL/hr over 60 Minutes Intravenous Every 8 hours 05/01/18 1201     04/30/18 1100  cefTRIAXone (ROCEPHIN) 2 g in sodium chloride 0.9 % 100 mL IVPB  2 g 200 mL/hr over 30 Minutes Intravenous Every 24 hours 04/29/18 1416      04/29/18 1430  cefTRIAXone (ROCEPHIN) 1 g in sodium chloride 0.9 % 100 mL IVPB     1 g 200 mL/hr over 30 Minutes Intravenous Every 24 hours 04/29/18 1416 04/30/18 1429   04/29/18 1130  cefTRIAXone (ROCEPHIN) 1 g in sodium chloride 0.9 % 100 mL IVPB     1 g 200 mL/hr over 30 Minutes Intravenous  Once 04/29/18 1118 04/29/18 1251      Procedures: None  CONSULTS:  None  Time spent: 35 minutes-Greater than 50% of this time was spent in counseling, explanation of diagnosis, planning of further management, and coordination of care.  MEDICATIONS: Scheduled Meds: . benztropine  1 mg Oral BID  . heparin  5,000 Units Subcutaneous Q8H  . OLANZapine  10 mg Oral BID  . tamsulosin  0.4 mg Oral Daily   Continuous Infusions: . sodium chloride Stopped (05/01/18 2232)  . cefTRIAXone (ROCEPHIN)  IV 2 g (05/01/18 1204)  . metronidazole 100 mL/hr at 05/02/18 0645  . valproate sodium Stopped (05/02/18 0536)   PRN Meds:.albuterol   PHYSICAL EXAM: Vital signs: Vitals:   05/02/18 0048 05/02/18 0159 05/02/18 0421 05/02/18 0730  BP: 92/60 100/73 104/75 113/72  Pulse:  91    Resp:  19  15  Temp:   98.5 F (36.9 C)   TempSrc:   Oral   SpO2:  95%    Weight:      Height:       Filed Weights   04/29/18 0842  Weight: 49.9 kg   Body mass index is 17.75 kg/m.   General appearance:Awake, alert, not in any distress.  Eyes:no scleral icterus. HEENT: Atraumatic and Normocephalic Neck: supple, no JVD. Resp:Good air entry bilaterally, few transmitted upper airway sounds but rales have improved compared to yesterday. CVS: S1 S2 regular, no murmurs.  GI: Bowel sounds present, Non tender and not distended with no gaurding, rigidity or rebound. Extremities: B/L Lower Ext shows no edema, both legs are warm to touch Neurology:  Non focal Psychiatric: Normal judgment and insight. Normal mood. Musculoskeletal:No digital cyanosis Skin:No Rash, warm and dry Wounds:N/A  I have personally reviewed  following labs and imaging studies  LABORATORY DATA: CBC: Recent Labs  Lab 04/29/18 0904 04/29/18 0930 04/30/18 0957 04/30/18 1055 05/01/18 0451 05/02/18 0538  WBC 13.9*  --  10.9* 11.6* 13.1* 12.9*  NEUTROABS 11.4*  --   --   --   --   --   HGB 11.1* 13.6 6.9* 7.5* 9.1* 8.3*  HCT 35.9* 40.0 21.9* 23.4* 28.4* 25.8*  MCV 96.5  --  95.6 95.5 92.5 93.5  PLT 330  --  244 256 221 269    Basic Metabolic Panel: Recent Labs  Lab 04/29/18 0904 04/29/18 0930 04/30/18 0618 05/01/18 0451 05/02/18 0538  NA 143 140 149* 145 142  K 3.5 3.4* 3.1* 3.3* 2.8*  CL 91* 95* 107 105 105  CO2 28  --  29 29 28   GLUCOSE 125* 129* 79 79 81  BUN 63* 62* 43* 26* 20  CREATININE 2.40* 2.20* 1.33* 1.03 0.98  CALCIUM 10.0  --  8.5* 8.5* 7.8*    GFR: Estimated Creatinine Clearance: 52.3 mL/min (by C-G formula based on SCr of 0.98 mg/dL).  Liver Function Tests: Recent Labs  Lab 04/29/18 0904  AST 27  ALT 13  ALKPHOS 75  BILITOT 0.3  PROT 7.9  ALBUMIN 3.5  Recent Labs  Lab 04/29/18 0904  LIPASE 204*   No results for input(s): AMMONIA in the last 168 hours.  Coagulation Profile: Recent Labs  Lab 04/29/18 0904  INR 1.07    Cardiac Enzymes: No results for input(s): CKTOTAL, CKMB, CKMBINDEX, TROPONINI in the last 168 hours.  BNP (last 3 results) No results for input(s): PROBNP in the last 8760 hours.  HbA1C: No results for input(s): HGBA1C in the last 72 hours.  CBG: No results for input(s): GLUCAP in the last 168 hours.  Lipid Profile: No results for input(s): CHOL, HDL, LDLCALC, TRIG, CHOLHDL, LDLDIRECT in the last 72 hours.  Thyroid Function Tests: No results for input(s): TSH, T4TOTAL, FREET4, T3FREE, THYROIDAB in the last 72 hours.  Anemia Panel: No results for input(s): VITAMINB12, FOLATE, FERRITIN, TIBC, IRON, RETICCTPCT in the last 72 hours.  Urine analysis:    Component Value Date/Time   COLORURINE AMBER (A) 04/29/2018 1059   APPEARANCEUR CLOUDY (A)  04/29/2018 1059   LABSPEC 1.020 04/29/2018 1059   PHURINE 6.5 04/29/2018 1059   GLUCOSEU NEGATIVE 04/29/2018 1059   HGBUR LARGE (A) 04/29/2018 1059   BILIRUBINUR MODERATE (A) 04/29/2018 1059   KETONESUR 15 (A) 04/29/2018 1059   PROTEINUR 30 (A) 04/29/2018 1059   UROBILINOGEN 1.0 06/08/2010 1523   NITRITE POSITIVE (A) 04/29/2018 1059   LEUKOCYTESUR MODERATE (A) 04/29/2018 1059    Sepsis Labs: Lactic Acid, Venous    Component Value Date/Time   LATICACIDVEN 1.07 04/29/2018 1334    MICROBIOLOGY: Recent Results (from the past 240 hour(s))  Culture, Urine     Status: Abnormal   Collection Time: 04/29/18 11:57 AM  Result Value Ref Range Status   Specimen Description URINE, RANDOM  Final   Special Requests   Final    NONE Performed at [facility], [address], [place], [place] [address]    Culture >=100,000 COLONIES/mL KLEBSIELLA PNEUMONIAE (A)  Final   Report Status 05/01/2018 FINAL  Final   Organism ID, Bacteria KLEBSIELLA PNEUMONIAE (A)  Final      Susceptibility   Klebsiella pneumoniae - MIC*    AMPICILLIN >=32 RESISTANT Resistant     CEFAZOLIN <=4 SENSITIVE Sensitive     CEFTRIAXONE <=1 SENSITIVE Sensitive     CIPROFLOXACIN <=0.25 SENSITIVE Sensitive     GENTAMICIN <=1 SENSITIVE Sensitive     IMIPENEM 0.5 SENSITIVE Sensitive     NITROFURANTOIN 64 INTERMEDIATE Intermediate     TRIMETH/SULFA <=20 SENSITIVE Sensitive     AMPICILLIN/SULBACTAM 16 INTERMEDIATE Intermediate     PIP/TAZO 16 SENSITIVE Sensitive     Extended ESBL NEGATIVE Sensitive     * >=100,000 COLONIES/mL KLEBSIELLA PNEUMONIAE  Culture, blood (routine x 2)     Status: None (Preliminary result)   Collection Time: 04/29/18  1:25 PM  Result Value Ref Range Status   Specimen Description BLOOD RIGHT ANTECUBITAL  Final   Special Requests   Final    BOTTLES DRAWN AEROBIC AND ANAEROBIC Blood Culture results may not be optimal due to an excessive volume of blood received in culture bottles   Culture    Final    NO GROWTH 3 DAYS Performed at [facility], [address], [place], [place] [address]    Report Status PENDING  Incomplete  Culture, blood (routine x 2)     Status: None (Preliminary result)   Collection Time: 04/30/18  6:18 AM  Result Value Ref Range Status   Specimen Description BLOOD RIGHT ANTECUBITAL  Final  Special Requests   Final    BOTTLES DRAWN AEROBIC ONLY Blood Culture adequate volume   Culture   Final    NO GROWTH 2 DAYS Performed at [facility], [address] [place], [place] [address]    Report Status PENDING  Incomplete    RADIOLOGY STUDIES/RESULTS: Ct Abdomen Pelvis Wo Contrast  Result Date: 04/30/2018 CLINICAL DATA:  Nausea, bilious vomiting EXAM: CT ABDOMEN AND PELVIS WITHOUT CONTRAST TECHNIQUE: Multidetector CT imaging of the abdomen and pelvis was performed following the standard protocol without IV contrast. Sagittal and coronal MPR images reconstructed from axial data set. Oral contrast was not administered. COMPARISON:  None FINDINGS: Lower chest: Emphysematous changes with BILATERAL lower lobe infiltrates consistent with either pneumonia or aspiration. Hepatobiliary: Liver unremarkable. Gallbladder not well visualized due to streak artifacts, grossly unremarkable. Pancreas: Normal appearance Spleen: Normal appearance Adrenals/Urinary Tract: Question adrenal thickening bilaterally. No obvious renal mass or hydronephrosis. Ureters not visualized. Foley catheter decompresses urinary bladder. Stomach/Bowel: Nasogastric tube in stomach. Low-attenuation fluid distends stomach and multiple proximal small bowel loops, with small bowel loops up to 4.1 cm diameter. Distal small bowel loops and colon are decompressed. Scattered stool throughout colon. Findings likely represent mid small bowel obstruction. Appendix not visualized. Suspected rectal wall thickening. Vascular/Lymphatic: Atherosclerotic calcification aorta. Aorta normal caliber. Reproductive:  Minimal prostatic enlargement and scattered prostatic calcifications. Other: No definite free air or free fluid. Nonspecific stranding of presacral fat. Musculoskeletal: No acute osseous findings. IMPRESSION: Dilated proximal and decompressed distal small bowel loops compatible with small bowel obstruction, likely in the mid small bowel. Due to lack of fat planes, streak artifacts, and lack of contrast, unable to localize site of obstruction. No evidence of perforation/free air. Question rectal wall thickening, recommend correlation with proctoscopy. Emphysematous changes with bibasilar pulmonary infiltrates either representing pneumonia or aspiration. Electronically Signed   By: Ulyses Southward M.D.   On: 04/30/2018 19:38   Dg Chest 2 View  Result Date: 04/12/2018 CLINICAL DATA:  Fever of unknown origin. EXAM: CHEST - 2 VIEW COMPARISON:  None. FINDINGS: The heart size is within normal limits. Ectasia of the thoracic aorta is noted. Pulmonary hyperinflation is seen, consistent with COPD. Mild scarring noted at right lung base as well as several old right rib fracture deformities. No evidence of pulmonary infiltrate, edema, or pleural effusion. IMPRESSION: COPD.  No active cardiopulmonary disease. Electronically Signed   By: Myles Rosenthal M.D.   On: 04/12/2018 19:16   Ct Head Wo Contrast  Result Date: 04/29/2018 CLINICAL DATA:  Altered level of consciousness increased over last 2 days, 2 episodes of nausea and vomiting, history of prior brain injury, dementia, former smoker, hypertension EXAM: CT HEAD WITHOUT CONTRAST TECHNIQUE: Contiguous axial images were obtained from the base of the skull through the vertex without intravenous contrast. COMPARISON:  06/08/2010 FINDINGS: Brain: Generalized atrophy. Normal ventricular morphology. No midline shift or mass effect. Small vessel chronic ischemic changes of deep cerebral white matter. 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 No intracranial hemorrhage, mass  lesion, evidence of acute infarction, or extra-axial fluid collection. Vascular: Minimal atherosclerotic calcification of internal carotid arteries at skull base Skull: Intact Sinuses/Orbits: Clear Other: N/A IMPRESSION: Atrophy with small vessel chronic ischemic changes of deep cerebral white matter. 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 No acute intracranial abnormalities. Electronically Signed   By: Ulyses Southward M.D.   On: 04/29/2018 09:58   Dg Chest Port 1 View  Result Date: 04/29/2018 CLINICAL DATA:  Altered  mental status EXAM: PORTABLE CHEST 1 VIEW COMPARISON:  April 12, 2018 FINDINGS: There is mild scarring in the lateral right base and in the medial right lower lung region. There is no edema or consolidation. The heart size and pulmonary vascularity are normal. No adenopathy. There is a recent appearing fracture of the posterior right seventh rib with alignment essentially anatomic. An older fracture of the posterior right eighth rib is noted. No pneumothorax. IMPRESSION: Recent appearing fracture posterior right seventh rib. Older fracture posterior right eighth rib noted. Areas of mild scarring noted on the right. No edema or consolidation. Stable cardiac silhouette. Electronically Signed   By: Bretta Bang III M.D.   On: 04/29/2018 08:46   Dg Chest Port 1v Same Day  Result Date: 05/01/2018 CLINICAL DATA:  history of schizophrenia, hypertension, BPH-mostly wheelchair-bound-presented to the hospital with approximately 1 week history of intermittent vomiting, confusion for a few days prior to this hospital admission-presented with sepsis secondary to UTI, and significant constipation. C/o SOB EXAM: PORTABLE CHEST - 1 VIEW SAME DAY COMPARISON:  04/29/2018 FINDINGS: Lungs are hyperinflated. Subsegmental atelectasis or early infiltrate at the left lung base, new since previous. Right lung clear. Heart size normal.  Tortuous ectatic thoracic aorta. No effusion. No pneumothorax.  Right anterolateral fourth rib fracture, age indeterminate. Nasogastric tube extends to the stomach. IMPRESSION: 1. New patchy infiltrate or subsegmental atelectasis at the left lung base. 2. Right fourth rib fracture without pneumothorax. 3. Nasogastric tube placement to the stomach. Electronically Signed   By: Corlis Leak M.D.   On: 05/01/2018 08:24   Dg Abd Portable 1v-small Bowel Obstruction Protocol-initial, 8 Hr Delay  Result Date: 05/01/2018 CLINICAL DATA:  Small bowel obstruction. EXAM: PORTABLE ABDOMEN - 1 VIEW COMPARISON:  Radiograph earlier this day at 1109 hour, CT yesterday. FINDINGS: Enteric tube in place with tip in the stomach, side-port just beyond the gastroesophageal junction. Improving small bowel dilatation in the left abdomen. Moderate stool in the right and transverse colon. No evidence of free air. Pelvic calcification may be stone in the urinary bladder or exophytic prostate calcification. IMPRESSION: Improving small bowel dilatation in the left abdomen since radiograph earlier this day. Electronically Signed   By: Narda Rutherford M.D.   On: 05/01/2018 21:14   Dg Abd Portable 1v-small Bowel Protocol-position Verification  Result Date: 05/01/2018 CLINICAL DATA:  Nasogastric tube placement EXAM: PORTABLE ABDOMEN - 1 VIEW COMPARISON:  Portable exam 1109 hours compared to 04/30/2018 FINDINGS: Tip of nasogastric tube projects over mid stomach. Dilated small bowel loops are seen in the LEFT mid abdomen. Increased stool in RIGHT colon. Lung bases appear emphysematous with subsegmental atelectasis at LEFT base. Bones demineralized. IMPRESSION: Tip of nasogastric tube projects over mid stomach. Small bowel dilatation in LEFT mid abdomen with increased stool in RIGHT colon. Electronically Signed   By: Ulyses Southward M.D.   On: 05/01/2018 11:22   Dg Abd Portable 1v  Result Date: 04/30/2018 CLINICAL DATA:  67 year old male with vomiting. EXAM: PORTABLE ABDOMEN - 1 VIEW COMPARISON:  04/29/2018  abdominal radiographs. FINDINGS: Portable AP semi upright and supine views at 0847 hours. Enteric tube has been placed and the stomach now appears decompressed. NG tube side hole at the gastric body. Non obstructed bowel gas pattern. Moderate volume of retained stool redemonstrated in the transverse colon and distal sigmoid/rectum. Negative visible lung bases. No pneumoperitoneum. Stable abdominal and pelvic visceral contours. Dystrophic calcification in the pelvis. Stable pelvic catheter, likely a Foley. No acute osseous abnormality identified. IMPRESSION:  1. NG tube in place with resolved gastric distention. 2. Non obstructed bowel gas pattern with moderate volume of retained stool. Electronically Signed   By: Odessa Fleming M.D.   On: 04/30/2018 09:06   Dg Abd Portable 1v  Result Date: 04/29/2018 CLINICAL DATA:  Nausea and vomiting EXAM: PORTABLE ABDOMEN - 1 VIEW COMPARISON:  None. FINDINGS: Gaseous distention of the stomach. Formed stool throughout the colon. No concerning mass effect or calcification. IMPRESSION: Prominent gaseous distension of the stomach. Constipated appearance. Electronically Signed   By: Marnee Spring M.D.   On: 04/29/2018 14:44     LOS: 3 days   Jeoffrey Massed, MD  [facility]  If 7PM-7AM, please contact night-coverage  Please page via [email or web address]-Password [email or web address]-click on MD name and type text message  05/02/2018, 10:11 AM

## 2018-05-02 NOTE — Progress Notes (Signed)
Care order received from Linton Flemings, NP that it is ok to give Benadryl.  No orders received regarding low urine output.  RN will continue to monitor patient and make provider aware of any changes.  P.J. Henderson Newcomer, RN

## 2018-05-02 NOTE — Progress Notes (Signed)
RN paged Linton Flemings, NP to make her aware that patient has had less than 200 ml urine this shift.  RN had paged her earlier in shift with no further orders received, awaiting response.  P.J. Henderson Newcomer, RN

## 2018-05-02 NOTE — Progress Notes (Signed)
RN paged Linton Flemings, NP to report patient has only had 100 ml urine output since 1900.  Also, RN made NP aware BP is now 100/73 and inquired if patient can have Benadryl IV per sisters request as this was held due to patient's low BP, awaiting response.  P.J. Henderson Newcomer, RN

## 2018-05-02 NOTE — Progress Notes (Signed)
RN received call back from Linton Flemings, NP instructing RN to bladder scan patient and flush Foley catheter.  Bladder scan shows 38 ml urine in bladder.  Foley flushed fine, no resistance noted.  Bed pad under patient noted to be wet and Foley appears to be leaking some.  RN paged NP back to make her aware of findings, awaiting response. P.J. Henderson Newcomer, RN

## 2018-05-03 ENCOUNTER — Inpatient Hospital Stay (HOSPITAL_COMMUNITY): Payer: Medicare Other

## 2018-05-03 LAB — BASIC METABOLIC PANEL
Anion gap: 12 (ref 5–15)
BUN: 18 mg/dL (ref 8–23)
CO2: 29 mmol/L (ref 22–32)
CREATININE: 0.98 mg/dL (ref 0.61–1.24)
Calcium: 8.4 mg/dL — ABNORMAL LOW (ref 8.9–10.3)
Chloride: 102 mmol/L (ref 98–111)
Glucose, Bld: 82 mg/dL (ref 70–99)
POTASSIUM: 2.9 mmol/L — AB (ref 3.5–5.1)
SODIUM: 143 mmol/L (ref 135–145)

## 2018-05-03 LAB — CBC
HCT: 28.9 % — ABNORMAL LOW (ref 39.0–52.0)
HEMOGLOBIN: 9.3 g/dL — AB (ref 13.0–17.0)
MCH: 30 pg (ref 26.0–34.0)
MCHC: 32.2 g/dL (ref 30.0–36.0)
MCV: 93.2 fL (ref 80.0–100.0)
PLATELETS: 350 10*3/uL (ref 150–400)
RBC: 3.1 MIL/uL — AB (ref 4.22–5.81)
RDW: 15.6 % — ABNORMAL HIGH (ref 11.5–15.5)
WBC: 14.8 10*3/uL — ABNORMAL HIGH (ref 4.0–10.5)

## 2018-05-03 LAB — MAGNESIUM: MAGNESIUM: 2 mg/dL (ref 1.7–2.4)

## 2018-05-03 MED ORDER — POTASSIUM CHLORIDE 10 MEQ/100ML IV SOLN
10.0000 meq | INTRAVENOUS | Status: AC
  Administered 2018-05-03 (×2): 10 meq via INTRAVENOUS
  Filled 2018-05-03 (×2): qty 100

## 2018-05-03 MED ORDER — MORPHINE SULFATE (PF) 2 MG/ML IV SOLN
2.0000 mg | Freq: Once | INTRAVENOUS | Status: AC
  Administered 2018-05-03: 2 mg via INTRAVENOUS
  Filled 2018-05-03: qty 1

## 2018-05-03 MED ORDER — DIATRIZOATE MEGLUMINE & SODIUM 66-10 % PO SOLN
90.0000 mL | Freq: Once | ORAL | Status: AC
  Administered 2018-05-03: 90 mL via NASOGASTRIC
  Filled 2018-05-03: qty 90

## 2018-05-03 MED ORDER — POTASSIUM CHLORIDE 10 MEQ/100ML IV SOLN
10.0000 meq | INTRAVENOUS | Status: AC
  Administered 2018-05-03: 10 meq via INTRAVENOUS
  Filled 2018-05-03: qty 100

## 2018-05-03 MED ORDER — KETOROLAC TROMETHAMINE 15 MG/ML IJ SOLN
15.0000 mg | Freq: Once | INTRAMUSCULAR | Status: AC
  Administered 2018-05-03: 15 mg via INTRAVENOUS
  Filled 2018-05-03: qty 1

## 2018-05-03 MED ORDER — SODIUM CHLORIDE 0.45 % IV SOLN
INTRAVENOUS | Status: AC
  Administered 2018-05-03 – 2018-05-04 (×3): via INTRAVENOUS
  Filled 2018-05-03 (×4): qty 1000

## 2018-05-03 MED ORDER — POTASSIUM CHLORIDE IN NACL 20-0.45 MEQ/L-% IV SOLN
INTRAVENOUS | Status: DC
  Administered 2018-05-03: 05:00:00 via INTRAVENOUS
  Filled 2018-05-03: qty 1000

## 2018-05-03 MED ORDER — OLANZAPINE 5 MG PO TBDP
10.0000 mg | ORAL_TABLET | Freq: Two times a day (BID) | ORAL | Status: DC
  Administered 2018-05-03 – 2018-06-03 (×53): 10 mg via ORAL
  Filled 2018-05-03 (×66): qty 2

## 2018-05-03 NOTE — Plan of Care (Signed)
POC reviewed with patient, no evidence of learning noted. No acute events throughout shift. Pt vomitted X 3-4 times throughout the night, PRN Zofran X 1 administered. RN to report off to oncoming Rn.

## 2018-05-03 NOTE — Progress Notes (Signed)
Physical Therapy Treatment Patient Details Name: Jeffrey Davila MRN: [id number] DOB: 05/09/1951 Today's Date: 05/03/2018    History of Present Illness Pt is a 67 y/o male admitted secondary to AMS and Sepsis most likely secondary to UTI. CT negative for acute abnormality. PMH inlcudes BI, dementia, HTN, and schizophrenia.     PT Comments    Pt performed progression to edge of bed and OOB.  Daughter present throughout session and initially discouraging PT session, despite patient expressing he did want to participate.  Sister appears to limit patient progression through treatments.  Educated sister on why PT is important and if patient consents we will continue.  Pt remains to reports he wished to get up and out of bed.  Assisted patient from bed to recliner.  Upon sitting patient presents with productive cough.  Encouraged patient to sit up to continue to expel sputum and improve lung function.  Pt limited to transfer to chair only as he is currently on continuous suction with NG tubing.    Follow Up Recommendations  Home health PT;Supervision/Assistance - 24 hour     Equipment Recommendations  Rolling walker with 5" wheels;Hospital bed    Recommendations for Other Services       Precautions / Restrictions Precautions Precautions: Fall;Other (comment) Precaution Comments: NG tube Restrictions Weight Bearing Restrictions: No    Mobility  Bed Mobility Overal bed mobility: Needs Assistance Bed Mobility: Rolling;Sidelying to Sit Rolling: Mod assist Sidelying to sit: Mod assist       General bed mobility comments: ModA and Mod VC for cuing and safety, assist for line management   Transfers Overall transfer level: Needs assistance Equipment used: None Transfers: Sit to/from [employer] Transfers Sit to Stand: Min assist;+2 safety/equipment Stand pivot transfers: Min assist;+2 safety/equipment       General transfer comment: MinA for all functional transfers, cues  for pivotal steps during stand-pivot transfer   Ambulation/Gait Ambulation/Gait assistance: Min assist;+2 safety/equipment Gait Distance (Feet): 4 Feet Assistive device: 2 person hand held assist Gait Pattern/deviations: Step-to pattern;Shuffle;Trunk flexed     General Gait Details: Assist to turn and back to seat.  +2 for line/lead management.     Stairs             Wheelchair Mobility    Modified Rankin (Stroke Patients Only)       Balance Overall balance assessment: Needs assistance   Sitting balance-Leahy Scale: Good       Standing balance-Leahy Scale: Fair                              Cognition Arousal/Alertness: Awake/alert Behavior During Therapy: Flat affect Overall Cognitive Status: History of cognitive impairments - at baseline                                 General Comments: 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 Pt able to follow simple one step commands. Was only oriented to person.       Exercises      General Comments        Pertinent Vitals/Pain Pain Assessment: Faces Pain Score: 0-No pain Faces Pain Scale: No hurt Pain Location: grimacing with LUE movement  Pain Descriptors / Indicators: Grimacing    Home Living  Prior Function            PT Goals (current goals can now be found in the care plan section) Acute Rehab PT Goals Patient Stated Goal: per sister to go home, get stronger  PT Goal Formulation: With family Potential to Achieve Goals: Fair Progress towards PT goals: Progressing toward goals    Frequency    Min 3X/week      PT Plan Current plan remains appropriate    Co-evaluation              AM-PAC PT "6 Clicks" Daily Activity  Outcome Measure  Difficulty turning over in bed (including adjusting bedclothes, sheets and blankets)?: A Lot Difficulty moving from lying on back to sitting on the side of the bed? : A Lot Difficulty sitting down on  and standing up from a chair with arms (e.g., wheelchair, bedside commode, etc,.)?: A Little Help needed moving to and from a bed to chair (including a wheelchair)?: A Little Help needed walking in hospital room?: A Lot Help needed climbing 3-5 steps with a railing? : Total 6 Click Score: 13    End of Session Equipment Utilized During Treatment: Gait belt Activity Tolerance: Patient tolerated treatment well Patient left: in chair;with call bell/phone within reach;with family/visitor present;with nursing/sitter in room Nurse Communication: Mobility status PT Visit Diagnosis: Unsteadiness on feet (R26.81);Muscle weakness (generalized) (M62.81);Difficulty in walking, not elsewhere classified (R26.2)     Time: 8119-1478 PT Time Calculation (min) (ACUTE ONLY): 18 min  Charges:  $Therapeutic Activity: 8-22 mins                     Joycelyn Rua, PTA Acute Rehabilitation Services Pager [phone] Office [phone]     Jesslynn Kruck [name] 05/03/2018, 5:36 PM

## 2018-05-03 NOTE — Progress Notes (Addendum)
CC:  SBO  Subjective: Nursing notes says pt vomited  3-4 times last PM. Vomited this AM, towel in place where he has just vomited some time this AM.  NG not working.    Objective: Vital signs in last 24 hours: Temp:  [98.7 F (37.1 C)-99.1 F (37.3 C)] 98.9 F (37.2 C) (10/08 0438) Pulse Rate:  [68-98] 98 (10/08 0438) Resp:  [18] 18 (10/08 0438) BP: (114-139)/(60-97) 139/97 (10/08 0438) SpO2:  [96 %-100 %] 96 % (10/08 0438) Last BM Date: 05/01/18 NG 450 recorded last PM for the day,  750 urine recorded for the day 224 IV recorded for the day Afebrile, BP up 3 AM Labs show rising WBC K+ 2.9 Film last PM shows the NG is out and it is to small to advance, Less SB dilatation, no obstruction/free air Intake/Output from previous day: 10/07 0701 - 10/08 0700 In: 224.5 [IV Piggyback:224.5] Out: 1202 [Urine:750; Emesis/NG output:452] Intake/Output this shift: No intake/output data recorded.  General appearance: Pt slept thru exam and did not respond to me during the exam.  He did not get his medicines yesterday. Resp: clear anterior exam GI: soft, he is not distended and not tender, few BS.  Lab Results:  Recent Labs    05/02/18 0538 05/03/18 0235  WBC 12.9* 14.8*  HGB 8.3* 9.3*  HCT 25.8* 28.9*  PLT 269 350    BMET Recent Labs    05/02/18 0538 05/03/18 0235  NA 142 143  K 2.8* 2.9*  CL 105 102  CO2 28 29  GLUCOSE 81 82  BUN 20 18  CREATININE 0.98 0.98  CALCIUM 7.8* 8.4*   PT/INR No results for input(s): LABPROT, INR in the last 72 hours.  Recent Labs  Lab 04/29/18 0904  AST 27  ALT 13  ALKPHOS 75  BILITOT 0.3  PROT 7.9  ALBUMIN 3.5     Lipase     Component Value Date/Time   LIPASE 204 (H) 04/29/2018 0904     Medications: . benztropine  1 mg Oral BID  . heparin  5,000 Units Subcutaneous Q8H  . OLANZapine  10 mg Oral BID  . 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  It is to small to put back in.  Repeat film after the NG is in.  He does not look obstructed on exam, but with him vomiting and already having aspiration pneumonia he needs a functional NG.  Once we get the repeat film this AM I will decide on possibly repeating the SB protocol.  He needs his potassium corrected, and the 4.0 range.  Some potassium is currently ordered.  I added more to the IV also.   Film back: Gaseous distention of stomach and left abdominal small bowel loops likely reflecting small bowel obstruction. NG tube is in the stomach.   I rechecked pt, back from xray, but not connected to suction.  I hooked him up, irrigated the NG and made it work.  He has about 200 in cannister now.  LOS: 4 days    Elvis Boot 05/03/2018 [id number]

## 2018-05-03 NOTE — Progress Notes (Signed)
RN requested something for pain from K. Schorr, NP as patient states he is hurting, PAINAD =4.  Order received for Morphine 2 mg IV.  RN will medicate patient per order and continue to monitor patient, making provider aware of changes as needed.  P.J. Henderson Newcomer, RN

## 2018-05-03 NOTE — Progress Notes (Signed)
PROGRESS NOTE        PATIENT DETAILS Name: Jeffrey Davila Age: 67 y.o. Sex: male Date of Birth: [birth year] Admit Date: 04/29/2018 Admitting Physician Starleen Arms, MD PCP:Uba, Reuel Boom, MD  Brief Narrative: Patient is a 67 y.o. male with history of schizophrenia, hypertension, BPH-mostly wheelchair-bound-presented to the hospital with approximately 1 week history of intermittent vomiting, confusion for a few days prior to this hospital admission-presented with sepsis secondary to UTI, vomiting and significant constipation.  Patient was started on IV antibiotics and admitted to the hospital service,initial imaging was suggestive of constipation causing gastric distention-NG tube was placed, however upon further evaluation with a CT scan he appears now to have a small bowel obstruction.  See below for further details.    Subjective: Vomited this morning multiple times-issues with NG tube malfunction.  Assessment/Plan: Sepsis secondary to complicated UTI: Sepsis physiology has resolved, urine culture positive for Klebsiella-blood cultures negative so far.  Continue IV Rocephin-we will transition to oral antibiotic when SBO resolves.    Aspiration pneumonia: Secondary to multiple episodes of vomiting-he appears to have developed infiltrates on his most recent chest x-ray-he is moving air well-continues to have some transmitted upper airway sounds-continue Rocephin and Flagyl.  Once SBO resolves will require speech therapy evaluation.  SBO: Issues with NG tube malfunction-vomiting this morning-NG tube replaced-x-ray this morning shows persistent SBO-continue n.p.o. status, and other supportive care.  General surgery following.    Acute metabolic encephalopathy: Secondary to sepsis-he was very lethargic and much more confused than his usual baseline on initial presentation.  He is now much improved and back to his usual baseline.  Anemia: Significant drop in  hemoglobin on 10/5-suspect hemoglobin on admission was artificially elevated due to dehydration-suspect drop in hemoglobin was secondary to acute illness/IV fluid dilution.  He did require 1 unit of PRBC-however hemoglobin has been stable since then.  Continue to follow CBC periodically.    Schizophrenia: Continue IV Depakote-unable to dose olanzapine due to n.p.o. status and ongoing vomiting/SBO.  Acute kidney injury: AKI has resolved-this was hemodynamically mediated in the setting of vomiting.     Hypernatremia: Resolved-continue half-normal saline.  Follow electrolytes  Hypokalemia: Due to vomiting-continue to replete and recheck.  Hypertension: Blood pressure stable-continue to hold all antihypertensives.    Severe malnutrition: Once diet advanced-start supplements.  Deconditioning/debility: Appears to be significantly cachectic-mostly bed to wheelchair bound at baseline-dependent on family for significant activities of daily living.  He appears to have significant amount of schizophrenia-not sure if he has some mental retardation/dementia as well.  DVT Prophylaxis: Prophylactic Heparin   Code Status: Full code   Family Communication: None at bedside  Disposition Plan: Remain inpatient-not stable for discharge-we will plan on home health services or SNF on discharge depending on family preference.  Antimicrobial agents: Anti-infectives (From admission, onward)   Start     Dose/Rate Route Frequency Ordered Stop   05/01/18 1215  metroNIDAZOLE (FLAGYL) IVPB 500 mg     500 mg 100 mL/hr over 60 Minutes Intravenous Every 8 hours 05/01/18 1201     04/30/18 1100  cefTRIAXone (ROCEPHIN) 2 g in sodium chloride 0.9 % 100 mL IVPB     2 g 200 mL/hr over 30 Minutes Intravenous Every 24 hours 04/29/18 1416     04/29/18 1430  cefTRIAXone (ROCEPHIN) 1 g in sodium chloride 0.9 % 100 mL  IVPB     1 g 200 mL/hr over 30 Minutes Intravenous Every 24 hours 04/29/18 1416 04/30/18 1429   04/29/18  1130  cefTRIAXone (ROCEPHIN) 1 g in sodium chloride 0.9 % 100 mL IVPB     1 g 200 mL/hr over 30 Minutes Intravenous  Once 04/29/18 1118 04/29/18 1251      Procedures: None  CONSULTS:  None  Time spent: 25 minutes-Greater than 50% of this time was spent in counseling, explanation of diagnosis, planning of further management, and coordination of care.  MEDICATIONS: Scheduled Meds: . benztropine  1 mg Oral BID  . diatrizoate meglumine-sodium  90 mL Per NG tube Once  . heparin  5,000 Units Subcutaneous Q8H   Continuous Infusions: . cefTRIAXone (ROCEPHIN)  IV Stopped (05/02/18 1110)  . metronidazole 500 mg (05/02/18 2359)  . sodium chloride 0.45 % with kcl    . valproate sodium 250 mg (05/03/18 1054)   PRN Meds:.albuterol, ondansetron (ZOFRAN) IV   PHYSICAL EXAM: Vital signs: Vitals:   05/02/18 0730 05/02/18 1300 05/02/18 2125 05/03/18 0438  BP: 113/72 114/60 129/86 (!) 139/97  Pulse: 90 68 84 98  Resp: 15  18 18   Temp: 98.2 F (36.8 C) 98.7 F (37.1 C) 99.1 F (37.3 C) 98.9 F (37.2 C)  TempSrc: Oral Oral Oral Oral  SpO2: 96% 98% 100% 96%  Weight:      Height:       Filed Weights   04/29/18 0842  Weight: 49.9 kg   Body mass index is 17.75 kg/m.   General appearance:Awake, alert, not in any distress.  Eyes:no scleral icterus. HEENT: Atraumatic and Normocephalic Neck: supple, no JVD. Resp:Good air entry bilaterally, few scattered rales-mostly transmitted upper airway sounds. CVS: S1 S2 regular, no murmurs.  GI: Bowel sounds present, Non tender and not distended Extremities: B/L Lower Ext shows no edema, both legs are warm to touch Neurology:  Non focal Musculoskeletal:No digital cyanosis Skin:No Rash, warm and dry Wounds:N/A  I have personally reviewed following labs and imaging studies  LABORATORY DATA: CBC: Recent Labs  Lab 04/29/18 0904  04/30/18 0957 04/30/18 1055 05/01/18 0451 05/02/18 0538 05/03/18 0235  WBC 13.9*  --  10.9* 11.6* 13.1*  12.9* 14.8*  NEUTROABS 11.4*  --   --   --   --   --   --   HGB 11.1*   < > 6.9* 7.5* 9.1* 8.3* 9.3*  HCT 35.9*   < > 21.9* 23.4* 28.4* 25.8* 28.9*  MCV 96.5  --  95.6 95.5 92.5 93.5 93.2  PLT 330  --  244 256 221 269 350   < > = values in this interval not displayed.    Basic Metabolic Panel: Recent Labs  Lab 04/29/18 0904 04/29/18 0930 04/30/18 0618 05/01/18 0451 05/02/18 0538 05/03/18 0235  NA 143 140 149* 145 142 143  K 3.5 3.4* 3.1* 3.3* 2.8* 2.9*  CL 91* 95* 107 105 105 102  CO2 28  --  29 29 28 29   GLUCOSE 125* 129* 79 79 81 82  BUN 63* 62* 43* 26* 20 18  CREATININE 2.40* 2.20* 1.33* 1.03 0.98 0.98  CALCIUM 10.0  --  8.5* 8.5* 7.8* 8.4*  MG  --   --   --   --   --  2.0    GFR: Estimated Creatinine Clearance: 52.3 mL/min (by C-G formula based on SCr of 0.98 mg/dL).  Liver Function Tests: Recent Labs  Lab 04/29/18 0904  AST 27  ALT  13  ALKPHOS 75  BILITOT 0.3  PROT 7.9  ALBUMIN 3.5   Recent Labs  Lab 04/29/18 0904  LIPASE 204*   No results for input(s): AMMONIA in the last 168 hours.  Coagulation Profile: Recent Labs  Lab 04/29/18 0904  INR 1.07    Cardiac Enzymes: No results for input(s): CKTOTAL, CKMB, CKMBINDEX, TROPONINI in the last 168 hours.  BNP (last 3 results) No results for input(s): PROBNP in the last 8760 hours.  HbA1C: No results for input(s): HGBA1C in the last 72 hours.  CBG: No results for input(s): GLUCAP in the last 168 hours.  Lipid Profile: No results for input(s): CHOL, HDL, LDLCALC, TRIG, CHOLHDL, LDLDIRECT in the last 72 hours.  Thyroid Function Tests: No results for input(s): TSH, T4TOTAL, FREET4, T3FREE, THYROIDAB in the last 72 hours.  Anemia Panel: No results for input(s): VITAMINB12, FOLATE, FERRITIN, TIBC, IRON, RETICCTPCT in the last 72 hours.  Urine analysis:    Component Value Date/Time   COLORURINE AMBER (A) 04/29/2018 1059   APPEARANCEUR CLOUDY (A) 04/29/2018 1059   LABSPEC 1.020 04/29/2018 1059     PHURINE 6.5 04/29/2018 1059   GLUCOSEU NEGATIVE 04/29/2018 1059   HGBUR LARGE (A) 04/29/2018 1059   BILIRUBINUR MODERATE (A) 04/29/2018 1059   KETONESUR 15 (A) 04/29/2018 1059   PROTEINUR 30 (A) 04/29/2018 1059   UROBILINOGEN 1.0 06/08/2010 1523   NITRITE POSITIVE (A) 04/29/2018 1059   LEUKOCYTESUR MODERATE (A) 04/29/2018 1059    Sepsis Labs: Lactic Acid, Venous    Component Value Date/Time   LATICACIDVEN 1.07 04/29/2018 1334    MICROBIOLOGY: Recent Results (from the past 240 hour(s))  Culture, Urine     Status: Abnormal   Collection Time: 04/29/18 11:57 AM  Result Value Ref Range Status   Specimen Description URINE, RANDOM  Final   Special Requests   Final    NONE Performed at [facility], [address] [place], [place] [address]    Culture >=100,000 COLONIES/mL KLEBSIELLA PNEUMONIAE (A)  Final   Report Status 05/01/2018 FINAL  Final   Organism ID, Bacteria KLEBSIELLA PNEUMONIAE (A)  Final      Susceptibility   Klebsiella pneumoniae - MIC*    AMPICILLIN >=32 RESISTANT Resistant     CEFAZOLIN <=4 SENSITIVE Sensitive     CEFTRIAXONE <=1 SENSITIVE Sensitive     CIPROFLOXACIN <=0.25 SENSITIVE Sensitive     GENTAMICIN <=1 SENSITIVE Sensitive     IMIPENEM 0.5 SENSITIVE Sensitive     NITROFURANTOIN 64 INTERMEDIATE Intermediate     TRIMETH/SULFA <=20 SENSITIVE Sensitive     AMPICILLIN/SULBACTAM 16 INTERMEDIATE Intermediate     PIP/TAZO 16 SENSITIVE Sensitive     Extended ESBL NEGATIVE Sensitive     * >=100,000 COLONIES/mL KLEBSIELLA PNEUMONIAE  Culture, blood (routine x 2)     Status: None (Preliminary result)   Collection Time: 04/29/18  1:25 PM  Result Value Ref Range Status   Specimen Description BLOOD RIGHT ANTECUBITAL  Final   Special Requests   Final    BOTTLES DRAWN AEROBIC AND ANAEROBIC Blood Culture results may not be optimal due to an excessive volume of blood received in culture bottles   Culture   Final    NO GROWTH 4 DAYS Performed at [facility], [address] [place], [place] [address]    Report Status PENDING  Incomplete  Culture, blood (routine x 2)     Status: None (Preliminary result)   Collection Time: 04/30/18  6:18 AM  Result Value Ref Range Status   Specimen Description BLOOD RIGHT ANTECUBITAL  Final   Special Requests   Final    BOTTLES DRAWN AEROBIC ONLY Blood Culture adequate volume   Culture   Final    NO GROWTH 3 DAYS Performed at [facility], [address] [place], [place] [address]    Report Status PENDING  Incomplete    RADIOLOGY STUDIES/RESULTS: Ct Abdomen Pelvis Wo Contrast  Result Date: 04/30/2018 CLINICAL DATA:  Nausea, bilious vomiting EXAM: CT ABDOMEN AND PELVIS WITHOUT CONTRAST TECHNIQUE: Multidetector CT imaging of the abdomen and pelvis was performed following the standard protocol without IV contrast. Sagittal and coronal MPR images reconstructed from axial data set. Oral contrast was not administered. COMPARISON:  None FINDINGS: Lower chest: Emphysematous changes with BILATERAL lower lobe infiltrates consistent with either pneumonia or aspiration. Hepatobiliary: Liver unremarkable. Gallbladder not well visualized due to streak artifacts, grossly unremarkable. Pancreas: Normal appearance Spleen: Normal appearance Adrenals/Urinary Tract: Question adrenal thickening bilaterally. No obvious renal mass or hydronephrosis. Ureters not visualized. Foley catheter decompresses urinary bladder. Stomach/Bowel: Nasogastric tube in stomach. Low-attenuation fluid distends stomach and multiple proximal small bowel loops, with small bowel loops up to 4.1 cm diameter. Distal small bowel loops and colon are decompressed. Scattered stool throughout colon. Findings likely represent mid small bowel obstruction. Appendix not visualized. Suspected rectal wall thickening. Vascular/Lymphatic: Atherosclerotic calcification aorta. Aorta normal caliber. Reproductive: Minimal prostatic enlargement and scattered prostatic  calcifications. Other: No definite free air or free fluid. Nonspecific stranding of presacral fat. Musculoskeletal: No acute osseous findings. IMPRESSION: Dilated proximal and decompressed distal small bowel loops compatible with small bowel obstruction, likely in the mid small bowel. Due to lack of fat planes, streak artifacts, and lack of contrast, unable to localize site of obstruction. No evidence of perforation/free air. Question rectal wall thickening, recommend correlation with proctoscopy. Emphysematous changes with bibasilar pulmonary infiltrates either representing pneumonia or aspiration. Electronically Signed   By: Ulyses Southward M.D.   On: 04/30/2018 19:38   Dg Chest 2 View  Result Date: 04/12/2018 CLINICAL DATA:  Fever of unknown origin. EXAM: CHEST - 2 VIEW COMPARISON:  None. FINDINGS: The heart size is within normal limits. Ectasia of the thoracic aorta is noted. Pulmonary hyperinflation is seen, consistent with COPD. Mild scarring noted at right lung base as well as several old right rib fracture deformities. No evidence of pulmonary infiltrate, edema, or pleural effusion. IMPRESSION: COPD.  No active cardiopulmonary disease. Electronically Signed   By: Myles Rosenthal M.D.   On: 04/12/2018 19:16   Dg Abd 1 View  Result Date: 05/03/2018 CLINICAL DATA:  Small bowel obstruction EXAM: ABDOMEN - 1 VIEW COMPARISON:  05/02/2018 FINDINGS: NG tube is in the stomach. Gaseous distention of the stomach and left abdominal small bowel loops. Gas and stool within nondistended colon. No free air or organomegaly. IMPRESSION: Gaseous distention of stomach and left abdominal small bowel loops likely reflecting small bowel obstruction. NG tube is in the stomach. Electronically Signed   By: Charlett Nose M.D.   On: 05/03/2018 10:05   Dg Abd 1 View  Result Date: 05/02/2018 CLINICAL DATA:  Small bowel obstruction EXAM: ABDOMEN - 1 VIEW COMPARISON:  May 01, 2018 abdominal radiograph and CT abdomen and pelvis  April 30, 2018 FINDINGS: Nasogastric tube no longer present. There is slightly less bowel dilatation in the left upper quadrant compared to 1 day prior. There is moderate air in the stomach. No bowel dilatation elsewhere. No air-fluid levels. No  free air. There is moderate stool in the colon. There is atelectatic change in the right lung base. IMPRESSION: Nasogastric tube no longer appreciable. Less small bowel dilatation in the left upper quadrant. Question resolving bowel obstruction. No free air evident. Electronically Signed   By: Bretta Bang III M.D.   On: 05/02/2018 14:20   Ct Head Wo Contrast  Result Date: 04/29/2018 CLINICAL DATA:  Altered level of consciousness increased over last 2 days, 2 episodes of nausea and vomiting, history of prior brain injury, dementia, former smoker, hypertension EXAM: CT HEAD WITHOUT CONTRAST TECHNIQUE: Contiguous axial images were obtained from the base of the skull through the vertex without intravenous contrast. COMPARISON:  06/08/2010 FINDINGS: Brain: Generalized atrophy. Normal ventricular morphology. No midline shift or mass effect. Small vessel chronic ischemic changes of deep cerebral white matter. 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 No intracranial hemorrhage, mass lesion, evidence of acute infarction, or extra-axial fluid collection. Vascular: Minimal atherosclerotic calcification of internal carotid arteries at skull base Skull: Intact Sinuses/Orbits: Clear Other: N/A IMPRESSION: Atrophy with small vessel chronic ischemic changes of deep cerebral white matter. 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 No acute intracranial abnormalities. Electronically Signed   By: Ulyses Southward M.D.   On: 04/29/2018 09:58   Dg Chest Port 1 View  Result Date: 04/29/2018 CLINICAL DATA:  Altered mental status EXAM: PORTABLE CHEST 1 VIEW COMPARISON:  April 12, 2018 FINDINGS: There is mild scarring in the lateral right base and in the medial right  lower lung region. There is no edema or consolidation. The heart size and pulmonary vascularity are normal. No adenopathy. There is a recent appearing fracture of the posterior right seventh rib with alignment essentially anatomic. An older fracture of the posterior right eighth rib is noted. No pneumothorax. IMPRESSION: Recent appearing fracture posterior right seventh rib. Older fracture posterior right eighth rib noted. Areas of mild scarring noted on the right. No edema or consolidation. Stable cardiac silhouette. Electronically Signed   By: Bretta Bang III M.D.   On: 04/29/2018 08:46   Dg Chest Port 1v Same Day  Result Date: 05/01/2018 CLINICAL DATA:  history of schizophrenia, hypertension, BPH-mostly wheelchair-bound-presented to the hospital with approximately 1 week history of intermittent vomiting, confusion for a few days prior to this hospital admission-presented with sepsis secondary to UTI, and significant constipation. C/o SOB EXAM: PORTABLE CHEST - 1 VIEW SAME DAY COMPARISON:  04/29/2018 FINDINGS: Lungs are hyperinflated. Subsegmental atelectasis or early infiltrate at the left lung base, new since previous. Right lung clear. Heart size normal.  Tortuous ectatic thoracic aorta. No effusion. No pneumothorax. Right anterolateral fourth rib fracture, age indeterminate. Nasogastric tube extends to the stomach. IMPRESSION: 1. New patchy infiltrate or subsegmental atelectasis at the left lung base. 2. Right fourth rib fracture without pneumothorax. 3. Nasogastric tube placement to the stomach. Electronically Signed   By: Corlis Leak M.D.   On: 05/01/2018 08:24   Dg Abd Portable 1v-small Bowel Obstruction Protocol-initial, 8 Hr Delay  Result Date: 05/01/2018 CLINICAL DATA:  Small bowel obstruction. EXAM: PORTABLE ABDOMEN - 1 VIEW COMPARISON:  Radiograph earlier this day at 1109 hour, CT yesterday. FINDINGS: Enteric tube in place with tip in the stomach, side-port just beyond the gastroesophageal  junction. Improving small bowel dilatation in the left abdomen. Moderate stool in the right and transverse colon. No evidence of free air. Pelvic calcification may be stone in the urinary bladder or exophytic prostate calcification. IMPRESSION: Improving small bowel dilatation in  the left abdomen since radiograph earlier this day. Electronically Signed   By: Narda Rutherford M.D.   On: 05/01/2018 21:14   Dg Abd Portable 1v-small Bowel Protocol-position Verification  Result Date: 05/01/2018 CLINICAL DATA:  Nasogastric tube placement EXAM: PORTABLE ABDOMEN - 1 VIEW COMPARISON:  Portable exam 1109 hours compared to 04/30/2018 FINDINGS: Tip of nasogastric tube projects over mid stomach. Dilated small bowel loops are seen in the LEFT mid abdomen. Increased stool in RIGHT colon. Lung bases appear emphysematous with subsegmental atelectasis at LEFT base. Bones demineralized. IMPRESSION: Tip of nasogastric tube projects over mid stomach. Small bowel dilatation in LEFT mid abdomen with increased stool in RIGHT colon. Electronically Signed   By: Ulyses Southward M.D.   On: 05/01/2018 11:22   Dg Abd Portable 1v  Result Date: 04/30/2018 CLINICAL DATA:  67 year old male with vomiting. EXAM: PORTABLE ABDOMEN - 1 VIEW COMPARISON:  04/29/2018 abdominal radiographs. FINDINGS: Portable AP semi upright and supine views at 0847 hours. Enteric tube has been placed and the stomach now appears decompressed. NG tube side hole at the gastric body. Non obstructed bowel gas pattern. Moderate volume of retained stool redemonstrated in the transverse colon and distal sigmoid/rectum. Negative visible lung bases. No pneumoperitoneum. Stable abdominal and pelvic visceral contours. Dystrophic calcification in the pelvis. Stable pelvic catheter, likely a Foley. No acute osseous abnormality identified. IMPRESSION: 1. NG tube in place with resolved gastric distention. 2. Non obstructed bowel gas pattern with moderate volume of retained stool.  Electronically Signed   By: Odessa Fleming M.D.   On: 04/30/2018 09:06   Dg Abd Portable 1v  Result Date: 04/29/2018 CLINICAL DATA:  Nausea and vomiting EXAM: PORTABLE ABDOMEN - 1 VIEW COMPARISON:  None. FINDINGS: Gaseous distention of the stomach. Formed stool throughout the colon. No concerning mass effect or calcification. IMPRESSION: Prominent gaseous distension of the stomach. Constipated appearance. Electronically Signed   By: Marnee Spring M.D.   On: 04/29/2018 14:44     LOS: 4 days   Jeoffrey Massed, MD  [facility]  If 7PM-7AM, please contact night-coverage  Please page via [email or web address]-Password [email or web address]-click on MD name and type text message  05/03/2018, 12:09 PM

## 2018-05-03 NOTE — Progress Notes (Signed)
Paged MD at this time per Pt's sister request. Awaiting response.

## 2018-05-03 NOTE — Progress Notes (Signed)
Spent approximately 50 minutes in patient's room attempting to give meds, do patient care, and turn patient. Woman at bedside, who identified herself as patient's sister, constantly interferes with patient treatment. After I attempted IV placement unsuccessfully, I verbalized to her that we will consult the IV team to place IV. She stated, "I can't let him go through that, he's not getting an IV." I educated her on the need for an additional IV d/t bowel blockage and unable to received po meds and that patient's potassium level is low and needs to receive potassium and abx therapy for his medical condition. Sister verbalizes understanding and agrees to IV. She demands a monitor. I checked the orders and the monitor was d/c by a physician yesterday. I stated to her that I would talk with her nurse, but a physician must order cardiac monitoring. When attempting to turn patient, sister states "he don't want to turn". I attempted to educate sister on importance of turning side to side to prevent skin breakdown. But she refuses at this time. She states she is a Engineer, civil (consulting), then admits to being a former Engineer, civil (consulting) aid. She had multiple c/o regarding care, though non- specific, other than "they didn't change his gown this morning." We changed patient's gown after cleaning him and repositioning him in bed. Patient's sister is difficult to placate, despite multiple attempts to address each of her needs as it relates to this patient.

## 2018-05-03 NOTE — Care Management Important Message (Signed)
Important Message  Patient Details  Name: Jeffrey Davila MRN: [id number] Date of Birth: Apr 02, 1951   Medicare Important Message Given:  Yes    Shatina Streets 05/03/2018, 2:09 PM

## 2018-05-04 ENCOUNTER — Inpatient Hospital Stay: Payer: Self-pay

## 2018-05-04 ENCOUNTER — Inpatient Hospital Stay (HOSPITAL_COMMUNITY): Payer: Medicare Other

## 2018-05-04 LAB — BASIC METABOLIC PANEL
Anion gap: 10 (ref 5–15)
BUN: 20 mg/dL (ref 8–23)
CHLORIDE: 101 mmol/L (ref 98–111)
CO2: 30 mmol/L (ref 22–32)
Calcium: 8.4 mg/dL — ABNORMAL LOW (ref 8.9–10.3)
Creatinine, Ser: 0.97 mg/dL (ref 0.61–1.24)
GFR calc Af Amer: >60 mL/min (ref >60)
GFR calc non Af Amer: >60 mL/min (ref >60)
GLUCOSE: 81 mg/dL (ref 70–99)
POTASSIUM: 3.3 mmol/L — AB (ref 3.5–5.1)
Sodium: 141 mmol/L (ref 135–145)

## 2018-05-04 LAB — CBC
HCT: 27 % — ABNORMAL LOW (ref 39.0–52.0)
HEMOGLOBIN: 8.5 g/dL — AB (ref 13.0–17.0)
MCH: 29.4 pg (ref 26.0–34.0)
MCHC: 31.5 g/dL (ref 30.0–36.0)
MCV: 93.4 fL (ref 80.0–100.0)
Platelets: 374 10*3/uL (ref 150–400)
RBC: 2.89 MIL/uL — AB (ref 4.22–5.81)
RDW: 15.3 % (ref 11.5–15.5)
WBC: 18.4 10*3/uL — ABNORMAL HIGH (ref 4.0–10.5)
nRBC: 0 % (ref 0.0–0.2)

## 2018-05-04 LAB — CULTURE, BLOOD (ROUTINE X 2): CULTURE: NO GROWTH

## 2018-05-04 LAB — GLUCOSE, CAPILLARY
GLUCOSE-CAPILLARY: 66 mg/dL — AB (ref 70–99)
GLUCOSE-CAPILLARY: 90 mg/dL (ref 70–99)
Glucose-Capillary: 60 mg/dL — ABNORMAL LOW (ref 70–99)
Glucose-Capillary: 78 mg/dL (ref 70–99)

## 2018-05-04 LAB — MAGNESIUM: Magnesium: 1.9 mg/dL (ref 1.7–2.4)

## 2018-05-04 MED ORDER — SODIUM CHLORIDE 0.9 % IV SOLN
INTRAVENOUS | Status: DC | PRN
  Administered 2018-05-05: 500 mL via INTRAVENOUS
  Administered 2018-05-14: 1000 mL via INTRAVENOUS
  Administered 2018-05-24: 450 mL via INTRAVENOUS
  Administered 2018-05-25: 500 mL via INTRAVENOUS
  Administered 2018-05-29: 250 mL via INTRAVENOUS

## 2018-05-04 MED ORDER — INSULIN ASPART 100 UNIT/ML ~~LOC~~ SOLN
0.0000 [IU] | SUBCUTANEOUS | Status: DC
  Administered 2018-05-06: 1 [IU] via SUBCUTANEOUS

## 2018-05-04 MED ORDER — LORAZEPAM 2 MG/ML IJ SOLN
0.5000 mg | Freq: Once | INTRAMUSCULAR | Status: AC
  Administered 2018-05-04: 0.5 mg via INTRAVENOUS
  Filled 2018-05-04: qty 1

## 2018-05-04 MED ORDER — POTASSIUM CHLORIDE 10 MEQ/100ML IV SOLN
10.0000 meq | INTRAVENOUS | Status: AC
  Administered 2018-05-04 (×3): 10 meq via INTRAVENOUS
  Filled 2018-05-04 (×3): qty 100

## 2018-05-04 MED ORDER — DEXTROSE 50 % IV SOLN
12.5000 g | Freq: Once | INTRAVENOUS | Status: AC
  Administered 2018-05-04 – 2018-05-05 (×2): 12.5 g via INTRAVENOUS
  Filled 2018-05-04: qty 50

## 2018-05-04 MED ORDER — SODIUM CHLORIDE 0.45 % IV SOLN
INTRAVENOUS | Status: AC
  Administered 2018-05-04 – 2018-05-05 (×2): via INTRAVENOUS
  Filled 2018-05-04 (×3): qty 1000

## 2018-05-04 MED ORDER — TRAVASOL 10 % IV SOLN
INTRAVENOUS | Status: AC
  Filled 2018-05-04: qty 432

## 2018-05-04 MED ORDER — POTASSIUM CHLORIDE 10 MEQ/100ML IV SOLN: 10.0000 meq | INTRAVENOUS | Status: DC

## 2018-05-04 NOTE — Progress Notes (Addendum)
Nutrition Follow-up  DOCUMENTATION CODES:   Severe malnutrition in context of acute illness/injury, Underweight  INTERVENTION:   -TPN management per pharmacy; recommend starting at a low rate and advance slowly due to high refeeding risk -Recommending monitoring Mg, K, and Phos daily for at least 3 days and replete as needed due to high refeeding risk -RD will follow for diet advancement and supplement diet is appropriate  NUTRITION DIAGNOSIS:   Severe Malnutrition related to acute illness(SBO) as evidenced by moderate muscle depletion, severe muscle depletion, moderate fat depletion, severe fat depletion, percent weight loss.  Ongoing  GOAL:   Patient will meet greater than or equal to 90% of their needs  Progressing  MONITOR:   Diet advancement, Labs, Weight trends, Skin, I & O's  REASON FOR ASSESSMENT:   Consult New TPN/TNA  ASSESSMENT:    Jeffrey Davila  is a 67 y.o. male, past medical history of schizophrenia, hypertension, BPH, who lives with his sister at home, for last 15 years, he is wheelchair dependent at baseline, but able to transfer from bed to chair, communicative and appropriate per sister, she reports over the last few days patient has been less interactive, sleeping more often during the day, and over the last 2 days almost with no significant oral intake, which prompted her to come to ED.  10/4- NGT placed 10/8- NGT removed, NGT replaced  NGT output: 2.6 L x 24 hours  I/O's: -681 ml x 24 hours and +141 ml since admisison  Pt resting in bed at time of visit. NGT still connected to low, intermittent suction.   Reviewed MD notes; pt sister is not amenable to surgery at this time, however, amenable to starting TPN. Pt has been NPO for 5 days and has severe malnutrition in the context of acute illness (however suspect more chronic in nature). Pt is at high risk for refeeding syndrome.   Plan to place PICC and start TPN. Per pharmacy note, plan to start  Start TPN at 35 mL/hr, which will provide 767 kcals and 43 grams of protein, meeting 49% of estimated kcal needs and 51% of estimated protein needs.   Labs reviewed: K: 3.3.   Diet Order:   Diet Order            Diet NPO time specified  Diet effective now              EDUCATION NEEDS:   Education needs have been addressed  Skin:  Skin Assessment: Reviewed RN Assessment  Last BM:  04/30/18  Height:   Ht Readings from Last 1 Encounters:  04/29/18 5\' 6"  (1.676 m)    Weight:   Wt Readings from Last 1 Encounters:  04/29/18 49.9 kg    Ideal Body Weight:  64.5 kg  BMI:  Body mass index is 17.75 kg/m.  Estimated Nutritional Needs:   Kcal:  1550-1750  Protein:  85-100 grams  Fluid:  >1.5 L    Ronetta Molla A. Mayford Knife, RD, LDN, CDE Pager: [phone] After hours Pager: [phone]

## 2018-05-04 NOTE — Progress Notes (Signed)
Arrived to place PICC at bedside. Once patient was positioned on his back, he refused to have the PICC placed. Pulled arm away and Stated that "he's going to die and don't want the PICC line placed." RN made aware and informed his sister [name]. Will return after sister arrives around 45.

## 2018-05-04 NOTE — Progress Notes (Addendum)
Have discussed with the patient's sister at bedside multiple times-have gone into the room approximately 4 times alone today-accompanied by charge RN Ginger Gleason.  Was informed earlier that patient's sister requested transfer to [facility] after she spoke with general surgery.  This MD called the transfer center at [facility]-subsequently was told to call our radiology dept and have imaging shared with [facility] at power share-General surgery (Dr. Blake Divine) over at [facility] (no beds at main campus at [facility]) had a look at the [facility] felt that the patient did not require surgery right away, but was willing to consult on the patient if the patient was accepted by the hospitalist service.  Subsequently the transfer center got me in touch with Dr. Francina Ames who felt that since general surgery did not feel that patient requires surgery right away, and that the surgery could be done at [facility] (transfer requested at family request)-refused to accept the patient as a transfer.  Patient's sister had informed me multiple times earlier that she would just take the patient to [facility] claims that she would just put him in a car and take him there.  I again went into the patient's room a few minutes back-(accompanied by charge RN)-and explained the above.  Patient's sister initially said that she is just, pack him up and put him in a car and take him to [facility].  I explained the leaving AGAINST MEDICAL ADVICE process.  She however became irate.  All this time she had her husband on the phone.  Discussion was mostly around "guarantees" regarding surgery-I explained that surgery/general anesthesia, carry inherent risks-and no sort of guarantees can be given.  This was clearly explained to her earlier this morning when I rounded to her as well.    It seems like-family wants to hear answers that are just not possible to give.  She subsequently became irate and asked that I get a second hospitalist opinion-as she felt that she and I  were just not getting along.  I explained our departments no firing policy-but I would talk to 1 of my colleagues/director on call to see if we could get her a second opinion.  Patient's husband who was on the phone all this time-then asked his spouse (patient's sister) to just keep the patient here and have him treated here at [facility] if he needed surgery.  Sister-was agreeable-I did tell her numerous times that regarding techniques/method of surgery (laparoscopic versus open) she should ask the general surgeon.  But I did not think that the pneumonia which was improving radiographically was a significant barrier to surgery.    I will inform the surgical team as well as our hospitalist director on call.  Total additional time spent equals 60 minutes (including face-to-face time, and including numerous calls to the transfer center at [facility])

## 2018-05-04 NOTE — Progress Notes (Signed)
Per Danford Bad, RN, pt's sister stated she has been giving pt broth/   Pt asking this RN for water.

## 2018-05-04 NOTE — Progress Notes (Addendum)
Arrived to see if patient was still refusing PICC line. Pt pulled arm away and stated "I don't want that line". Consent for procedure by sister. Will readdress in AM when 2 PICC RN's can be present to insure safety and sterility. Primary RN notified.

## 2018-05-04 NOTE — Progress Notes (Signed)
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 at the gastric body. Non obstructed bowel gas pattern. Moderate volume of retained stool redemonstrated in the transverse colon and distal sigmoid/rectum. Negative visible lung bases. No pneumoperitoneum. Stable abdominal and pelvic visceral contours. Dystrophic calcification in the pelvis. Stable pelvic catheter, likely a Foley. No acute osseous abnormality identified. IMPRESSION: 1. NG tube in place with resolved gastric distention. 2. Non obstructed bowel gas pattern with moderate volume of retained stool. Electronically Signed   By: Odessa Fleming M.D.   On: 04/30/2018 09:06   Dg Abd Portable 1v  Result Date: 04/29/2018 CLINICAL DATA:  Nausea and vomiting EXAM: PORTABLE ABDOMEN - 1 VIEW COMPARISON:  None. FINDINGS: Gaseous distention of the stomach. Formed stool throughout the colon. No concerning mass effect or calcification. IMPRESSION: Prominent gaseous distension of the stomach. Constipated appearance. Electronically  Signed   By: Marnee Spring M.D.   On: 04/29/2018 14:44   [place] Ekg Site Rite  Result Date: 05/04/2018 If Site Rite image not attached, placement could not be confirmed due to current cardiac rhythm.  LOS: 5 days   Jeoffrey Massed, MD  [facility]  If 7PM-7AM, please contact night-coverage  Please page via [email or web address] on MD name and type text message  05/04/2018, 10:34 AM
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
## 2018-05-05 ENCOUNTER — Inpatient Hospital Stay (HOSPITAL_COMMUNITY): Payer: Medicare Other

## 2018-05-05 LAB — DIFFERENTIAL
ABS IMMATURE GRANULOCYTES: 0.1 10*3/uL — AB (ref 0.00–0.07)
Basophils Absolute: 0 10*3/uL (ref 0.0–0.1)
Basophils Relative: 0 %
EOS ABS: 0.1 10*3/uL (ref 0.0–0.5)
Eosinophils Relative: 0 %
Immature Granulocytes: 1 %
LYMPHS PCT: 5 %
Lymphs Abs: 0.8 10*3/uL (ref 0.7–4.0)
MONOS PCT: 7 %
Monocytes Absolute: 1.1 10*3/uL — ABNORMAL HIGH (ref 0.1–1.0)
Neutro Abs: 13.8 10*3/uL — ABNORMAL HIGH (ref 1.7–7.7)
Neutrophils Relative %: 87 %

## 2018-05-05 LAB — COMPREHENSIVE METABOLIC PANEL
ALK PHOS: 46 U/L (ref 38–126)
ALT: 6 U/L (ref 0–44)
AST: 8 U/L — ABNORMAL LOW (ref 15–41)
Albumin: 2.2 g/dL — ABNORMAL LOW (ref 3.5–5.0)
Anion gap: 6 (ref 5–15)
BUN: 13 mg/dL (ref 8–23)
CALCIUM: 8.1 mg/dL — AB (ref 8.9–10.3)
CO2: 27 mmol/L (ref 22–32)
CREATININE: 0.74 mg/dL (ref 0.61–1.24)
Chloride: 105 mmol/L (ref 98–111)
Glucose, Bld: 98 mg/dL (ref 70–99)
Potassium: 6.4 mmol/L (ref 3.5–5.1)
Sodium: 138 mmol/L (ref 135–145)
Total Bilirubin: 0.5 mg/dL (ref 0.3–1.2)
Total Protein: 5 g/dL — ABNORMAL LOW (ref 6.5–8.1)

## 2018-05-05 LAB — CBC
HEMATOCRIT: 24.6 % — AB (ref 39.0–52.0)
Hemoglobin: 7.4 g/dL — ABNORMAL LOW (ref 13.0–17.0)
MCH: 28.8 pg (ref 26.0–34.0)
MCHC: 30.1 g/dL (ref 30.0–36.0)
MCV: 95.7 fL (ref 80.0–100.0)
PLATELETS: 438 10*3/uL — AB (ref 150–400)
RBC: 2.57 MIL/uL — AB (ref 4.22–5.81)
RDW: 15.1 % (ref 11.5–15.5)
WBC: 15.8 10*3/uL — ABNORMAL HIGH (ref 4.0–10.5)
nRBC: 0 % (ref 0.0–0.2)

## 2018-05-05 LAB — PREALBUMIN: Prealbumin: 11.1 mg/dL — ABNORMAL LOW (ref 18–38)

## 2018-05-05 LAB — CULTURE, BLOOD (ROUTINE X 2)
CULTURE: NO GROWTH
Special Requests: ADEQUATE

## 2018-05-05 LAB — PHOSPHORUS: PHOSPHORUS: 1.4 mg/dL — AB (ref 2.5–4.6)

## 2018-05-05 LAB — BASIC METABOLIC PANEL
Anion gap: 9 (ref 5–15)
BUN: 13 mg/dL (ref 8–23)
CHLORIDE: 104 mmol/L (ref 98–111)
CO2: 27 mmol/L (ref 22–32)
CREATININE: 0.76 mg/dL (ref 0.61–1.24)
Calcium: 8.4 mg/dL — ABNORMAL LOW (ref 8.9–10.3)
GFR calc Af Amer: >60 mL/min (ref >60)
GFR calc non Af Amer: >60 mL/min (ref >60)
GLUCOSE: 49 mg/dL — AB (ref 70–99)
POTASSIUM: 3.9 mmol/L (ref 3.5–5.1)
Sodium: 140 mmol/L (ref 135–145)

## 2018-05-05 LAB — GLUCOSE, CAPILLARY
Glucose-Capillary: 109 mg/dL — ABNORMAL HIGH (ref 70–99)
Glucose-Capillary: 76 mg/dL (ref 70–99)
Glucose-Capillary: 62 mg/dL — ABNORMAL LOW (ref 70–99)
Glucose-Capillary: 89 mg/dL (ref 70–99)
Glucose-Capillary: 47 mg/dL — ABNORMAL LOW (ref 70–99)
Glucose-Capillary: 121 mg/dL — ABNORMAL HIGH (ref 70–99)
Glucose-Capillary: 136 mg/dL — ABNORMAL HIGH (ref 70–99)

## 2018-05-05 LAB — MAGNESIUM: MAGNESIUM: 1.6 mg/dL — AB (ref 1.7–2.4)

## 2018-05-05 LAB — TRIGLYCERIDES: Triglycerides: 91 mg/dL (ref <150)

## 2018-05-05 MED ORDER — SODIUM CHLORIDE 0.9% FLUSH
10.0000 mL | INTRAVENOUS | Status: DC | PRN
  Administered 2018-05-13: 20 mL
  Administered 2018-05-17: 10 mL
  Administered 2018-05-20: 30 mL
  Administered 2018-05-25: 20 mL
  Administered 2018-05-25 – 2018-06-02 (×3): 10 mL
  Filled 2018-05-05 (×7): qty 40

## 2018-05-05 MED ORDER — INSULIN ASPART 100 UNIT/ML ~~LOC~~ SOLN
8.0000 [IU] | Freq: Once | SUBCUTANEOUS | Status: AC
  Administered 2018-05-05: 8 [IU] via INTRAVENOUS

## 2018-05-05 MED ORDER — DEXTROSE 50 % IV SOLN
25.0000 g | Freq: Once | INTRAVENOUS | Status: AC
  Administered 2018-05-05: 25 mL via INTRAVENOUS
  Filled 2018-05-05: qty 50

## 2018-05-05 MED ORDER — DEXTROSE 50 % IV SOLN
INTRAVENOUS | Status: AC
  Administered 2018-05-05: 25 mL
  Filled 2018-05-05: qty 50

## 2018-05-05 MED ORDER — TRAVASOL 10 % IV SOLN
INTRAVENOUS | Status: AC
  Administered 2018-05-05: 18:00:00 via INTRAVENOUS
  Filled 2018-05-05: qty 432

## 2018-05-05 MED ORDER — DEXTROSE 50 % IV SOLN
INTRAVENOUS | Status: AC
  Administered 2018-05-05: 12.5 g via INTRAVENOUS
  Administered 2018-05-05: 25 mL via INTRAVENOUS
  Filled 2018-05-05: qty 50

## 2018-05-05 MED ORDER — SODIUM PHOSPHATES 45 MMOLE/15ML IV SOLN
20.0000 mmol | Freq: Once | INTRAVENOUS | Status: AC
  Administered 2018-05-05: 20 mmol via INTRAVENOUS
  Filled 2018-05-05: qty 6.67

## 2018-05-05 MED ORDER — DIPHENHYDRAMINE HCL 50 MG/ML IJ SOLN: 25.0000 mg | Freq: Once | INTRAMUSCULAR | Status: DC

## 2018-05-05 MED ORDER — MAGNESIUM SULFATE 2 GM/50ML IV SOLN
2.0000 g | Freq: Once | INTRAVENOUS | Status: AC
  Administered 2018-05-05: 2 g via INTRAVENOUS
  Filled 2018-05-05: qty 50

## 2018-05-05 NOTE — Plan of Care (Signed)

## 2018-05-05 NOTE — Progress Notes (Signed)
When this RN was in another room near nursing station helping a patient, this RN overheard this patient's family member exclaimed in the hallway to the [facility] "I'm not being racist, but I do not want a foreigner doctor taking care of him."
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
## 2018-05-05 NOTE — Progress Notes (Signed)
PHARMACY - ADULT TOTAL PARENTERAL NUTRITION CONSULT NOTE   Pharmacy Consult for TPN Indication: Bowel obstruction  Patient Measurements: Height: 5\' 6"  (167.6 cm) Weight: 110 lb (49.9 kg) IBW/kg (Calculated) : 63.8 TPN AdjBW (KG): 49.9 Body mass index is 17.75 kg/m.  Assessment:  64 YOM admitted on 10/4 with sepsis. He was found to have pneumonia and a possible SBO on CT on 10/5. Pt has a history of SBO s/p colectomy in 1993. An NG tube was placed with improving small bowel dilatation but patient had 3-4 episodes of vomiting overnight on 10/7-10/8 and the NG tube had to be replaced due to incorrect placement. Pharmacy now consulted to start TPN due to worsening SBO and concern for adhesions that will require surgery.   Of note, TPN was prepared on 10/9 but not administered since patient refused a PICC line.   GI: KUB on 10/9 shows persistent SBO which has worsened. Significant NG output in last 24 hours (2.5L). Of note, patient is extremely cachectic. Actual body is significantly below ideal body weight. Albumin on presentation is surprisingly wnl Endo: No h/o DM. Not on SSI  Insulin requirements in the past 24 hours: n/a  Lytes:K 6.4, Mg 1.6, Phos 1.4 Renal: SCr 0.97,No UOP charted yesterday   Pulm: on RA  Cards: VSS Hepatobil: LFTs wnl, Lipase elevated  Neuro: ID: WBC 15.8, on D#2 of ceftriaxone/flagyl for klebsiella UTI and aspiration pneumonia   TPN Access: PICC to be placed today  TPN start date:05/04/18 Nutritional Goals (per RD recommendation on 10/4): Kcal:  1550-1750 Protein:  85-100 grams Fluid:  >1.5 L   Goal TPN rate is 65 ml/hr (provides 1560 mL / 24 hours)  Current Nutrition:   Plan:  Start TPN at 35 mL/hr. This TPN provides 43 g of protein, 100 g of dextrose, and 28 g of lipids which provides 767 kCals per day, meeting 49% of patient needs Electrolytes in TPN: 1:1 Cl:Ac, 50 mEq/L Na, 20 mEq/L K, 5 mEq/L Ca, 52mEq/L Mg, 15 mmol/L Phos Add MVI, trace elements  to TPN Start sensitive SSI and adjust as needed RN to discuss with MD about removing K from MIVF  Monitor TPN labs. Given poor nutritional status at baseline, pt is high risk for refeeding.   Vinnie Level, PharmD., BCPS Clinical Pharmacist Clinical phone for 05/05/18 until 3:30pm: [phone] If after 3:30pm, please refer to [facility] for unit-specific pharmacist

## 2018-05-05 NOTE — Progress Notes (Addendum)
Patient alert and responsive with questions.  Denies pain with nodding of head. NG tube to right nare, clean, dry and intact.  Sister to bedside around 2200, complains of blood to NG tubing.  Went in to assess,  blood was noted to tubing, suctioning stopped and MD paged, flushed with 120 mls of sterile water.  Sister was verbally aggressive and had multiple complaints of with care provided to patient.  She requested to see hospital administration to voice concerns.  Patients BS noted hypoglycemic, sister stated in an angry tone "yall not giving him his iron to take care of his blood sugar".  Attempts to educate sister with DM management unsuccessful as she ignored my attempts.  While providing care to patient, sister called a male on the phone and placed the call on speaker phone, the caller did not address me directly but stated "Them motherf... Better not be disrespecting you because I'm going to come up there and I will whoop somebody a.."  The comments were ignored while attempting to provide patient care.  Once I left room, sister came to desk with phone on speaker and the male on the call repeated the same verbal threats, with charge nurse Baxter Hire and several other nurses present at the desk.  She requested to speak with administration, post [facility] speaking with her she was calm and allowed staff to help patient without further distress.  Patient in stable condition with all follow ups, will continue to monitor.

## 2018-05-05 NOTE — Progress Notes (Signed)
PT Cancellation Note  Patient Details Name: Jeffrey Davila MRN: [id number] DOB: Nov 11, 1950   Cancelled Treatment:    Reason Eval/Treat Not Completed: (P) Fatigue/lethargy limiting ability to participate(Pt reports "NO" clearly when prompted to participate in therapy.  PT deferred at this time based on patient's request.  )   Florestine Avers 05/05/2018, 4:20 PM  Joycelyn Rua, PTA Acute Rehabilitation Services Pager [phone] Office [phone]

## 2018-05-05 NOTE — Progress Notes (Signed)
PROGRESS NOTE        PATIENT DETAILS Name: Jeffrey Davila Age: 67 y.o. Sex: male Date of Birth: [birth year] Admit Date: 04/29/2018 Admitting Physician Starleen Arms, MD PCP:Uba, Reuel Boom, MD  Brief Narrative: Patient is a 67 y.o. male with history of schizophrenia/?  Cognitive dysfunction (dementia), hypertension, BPH-mostly wheelchair-bound-presented to the hospital with approximately 1 week history of intermittent vomiting, confusion for a few days prior to this hospital admission-presented with sepsis secondary to UTI, vomiting and significant constipation.  Patient was started on IV antibiotics and admitted to the hospital service,initial imaging was suggestive of constipation causing gastric distention-NG tube was placed, however upon further evaluation with a CT scan he appears now to have a small bowel obstruction.  Spelt course was complicated by intermittent vomiting-and development of what looks like an aspiration pneumonia.  In spite of conservative treatment with IV fluids, n.p.o. status-he appears to have persistent SBO-hospital course was complicated by persistent SBO-see below for further details.    Subjective: Lying comfortably in bed-follow simple commands.  Denies any abdominal pain.  Initially family was not at bedside-but when I examined it again with Will Jennings-General surgery-PA-C-sister was at bedside.  We had a long discussion.  Assessment/Plan: Sepsis secondary to complicated UTI: Sepsis pathophysiology has resolved, urine culture positive for Klebsiella-blood cultures negative so far.  Continue IV Rocephin.  Since remains n.p.o.-unable to transition to oral antimicrobial therapy.   Aspiration pneumonia: Secondary to vomiting-has underlying SBO-appears to have generalized frailty/cognitive dysfunction-and continues to remain at risk of aspiration when he is acutely ill.  N.p.o.-NG tube in place.  Overall improved-does not have leukocytosis  or fever.  Chest x-ray on 10/9 showed radiographic improvement.  Nursing staff aware that if patient is able to follow commands and participate in incentive spirometry-this should be encouraged.  Have asked nursing staff to see if we can suction some of his oral secretions that seem to be accumulating in the back of his throat.  Once SBO resolves-he definitely will require a speech therapy evaluation before initiating diet.  SBO: Did not hear any bowel sounds today-although he has some radiographic improvement-I suspect he has ongoing SBO.  N.p.o.-NG tube decompression continues.  General surgery following.  Yesterday, when question of surgery was brought up-sister asked for guarantees-we had long discussion (see yesterday's notes).  Patient's sister wanted transfer to [facility] for second opinion-however [facility] refused transfer.  This morning-I rounded with the surgical team-we answered numerous questions.  Given baseline frailty/cognitive dysfunction-now with worsening debility due to acute illness-patient remains at risk for aspiration and other complications if he were to require surgery.  However if SBO continues, we may have no choice but to proceed.  This was clearly explained to the patient's sister at bedside numerous times yesterday and today as well.  PICC line placed this morning-TNA subsequently will be started later today.  Acute metabolic encephalopathy: He did have encephalopathy on presentation-he was much more lethargic and confused than his usual baseline, encephalopathy has improved.  I suspect he is not very far from his usual baseline.  Etiology for encephalopathy was likely metabolic in the setting of sepsis and vomiting.    Anemia: No overt blood loss evident-suspect anemia secondary to acute illness-has required 1 unit of PRBC so far.  Hemoglobin slowly downtrending-back down to 7.4 today-may require transfusion if hemoglobin drops significantly.  Continue  to check CBC periodically.    Hyperkalemia: Occurred this morning-was put clinic over the past few days-and was on potassium supplementation.  Stop potassium supplementation-no EKG changes-insulin/D50 given-recheck electrolytes in a few hours.  Schizophrenia: Stable-on IV Depakote-after speaking with pharmacy-we have started patient back on oral dissolving tablets of olanzapine.  Pharmacy-hospitalist service is unable to use parenteral olanzapine-there is limited psychiatry and critical care.   Acute kidney injury: Hemodynamically mediated-resolved.   Hypernatremia: Resolved with hypotonic saline.  This was secondary to vomiting and dehydration.  Starting TNA today.    Hypertension: BP stable-continue to hold all antihypertensives.   Severe malnutrition: Once diet has been started-we will start oral supplements-see above regarding plans to start TNA per pharmacy today.  Deconditioning/debility: Appears to be very cachectic-mostly wheelchair bound, per sister this morning he is able to ambulate minimally around the house at times.  He appears to have significant amount of cognitive dysfunction/?dementia at baseline-appears to have much more worsening debility/deconditioning than his usual baseline.  This probably has resulted in him accumulating secretions occasionally.  Starting TNA-continue PT/OT evaluation.  Continue to mobilize out of bed to chair.    Other issues-Long discussion with patient's sister at bedside by myself and Will Marlyne Beards PA-C from general surgery-we have explained issues with risk of surgery multiple times-this morning she seems to be more receptive of these risks.  Explained that TNA has been started now because of persistent SBO.  Patient's sister demanded that we give him "something that is going to make him stronger".  Explained that TNA is for nutrition-and that worsening debility/deconditioning is due to acute illness.  We also discussed risk of aspiration especially with patient drooling  occasionally, and with accumulation of secretions.  Sister is hopeful of recovery, but claims that if she has issues with the nurses at night-she will request a transfer to [facility] in [place] tomorrow.  DVT Prophylaxis: Prophylactic Heparin   Code Status: Full code   Family Communication: Sister at bedside-see above  Disposition Plan: Remain inpatient-not stable for discharge  Antimicrobial agents: Anti-infectives (From admission, onward)   Start     Dose/Rate Route Frequency Ordered Stop   05/01/18 1215  metroNIDAZOLE (FLAGYL) IVPB 500 mg     500 mg 100 mL/hr over 60 Minutes Intravenous Every 8 hours 05/01/18 1201     04/30/18 1100  cefTRIAXone (ROCEPHIN) 2 g in sodium chloride 0.9 % 100 mL IVPB     2 g 200 mL/hr over 30 Minutes Intravenous Every 24 hours 04/29/18 1416     04/29/18 1430  cefTRIAXone (ROCEPHIN) 1 g in sodium chloride 0.9 % 100 mL IVPB     1 g 200 mL/hr over 30 Minutes Intravenous Every 24 hours 04/29/18 1416 04/30/18 1429   04/29/18 1130  cefTRIAXone (ROCEPHIN) 1 g in sodium chloride 0.9 % 100 mL IVPB     1 g 200 mL/hr over 30 Minutes Intravenous  Once 04/29/18 1118 04/29/18 1251      Procedures: None  CONSULTS:  None  Time spent: 35 minutes minutes-Greater than 50% of this time was spent in counseling, explanation of diagnosis, planning of further management, and coordination of care.  MEDICATIONS: Scheduled Meds: . benztropine  1 mg Oral BID  . diphenhydrAMINE  25 mg Intravenous Once  . heparin  5,000 Units Subcutaneous Q8H  . insulin aspart  0-9 Units Subcutaneous Q4H  . OLANZapine zydis  10 mg Oral BID   Continuous Infusions: . sodium chloride 500 mL (05/05/18 1429)  .  cefTRIAXone (ROCEPHIN)  IV 2 g (05/05/18 1431)  . metronidazole 500 mg (05/05/18 1444)  . sodium chloride 0.45 % with kcl 70 mL/hr at 05/05/18 0959  . sodium phosphate  Dextrose 5% IVPB    . TPN ADULT (ION)    . TPN ADULT (ION)    . valproate sodium  250 mg (05/05/18 1320)   PRN Meds:.sodium chloride, albuterol, ondansetron (ZOFRAN) IV, sodium chloride flush   PHYSICAL EXAM: Vital signs: Vitals:   05/04/18 1357 05/04/18 2133 05/05/18 0420 05/05/18 1115  BP: 104/67 111/73 104/76 114/80  Pulse: 64 85 82 99  Resp: 18 18 19  (!) 22  Temp: 98.6 F (37 C) (!) 97.5 F (36.4 C) (!) 97.5 F (36.4 C) 98.3 F (36.8 C)  TempSrc:      SpO2: 100% 100% 91% 97%  Weight:      Height:       Filed Weights   04/29/18 0842  Weight: 49.9 kg   Body mass index is 17.75 kg/m.  General appearance:Awake, alert, not in any distress.  Answers some questions appropriately.  NG tube in place. Eyes:no scleral icterus. HEENT: Atraumatic and Normocephalic Neck: supple, no JVD. Resp:Good air entry bilaterally, some transmitted upper airway sounds. CVS: S1 S2 regular, no murmurs.  GI: Bowel sounds absent, Non tender and not distended with no gaurding, rigidity or rebound. Extremities: B/L Lower Ext shows no edema, both legs are warm to touch Musculoskeletal:No digital cyanosis Skin:No Rash, warm and dry Wounds:N/A  I have personally reviewed following labs and imaging studies  LABORATORY DATA: CBC: Recent Labs  Lab 04/29/18 0904  05/01/18 0451 05/02/18 0538 05/03/18 0235 05/04/18 0730 05/05/18 1012  WBC 13.9*   < > 13.1* 12.9* 14.8* 18.4* 15.8*  NEUTROABS 11.4*  --   --   --   --   --  13.8*  HGB 11.1*   < > 9.1* 8.3* 9.3* 8.5* 7.4*  HCT 35.9*   < > 28.4* 25.8* 28.9* 27.0* 24.6*  MCV 96.5   < > 92.5 93.5 93.2 93.4 95.7  PLT 330   < > 221 269 350 374 438*   < > = values in this interval not displayed.    Basic Metabolic Panel: Recent Labs  Lab 05/01/18 0451 05/02/18 0538 05/03/18 0235 05/04/18 0730 05/04/18 0826 05/05/18 1012  NA 145 142 143 141  --  138  K 3.3* 2.8* 2.9* 3.3*  --  6.4*  CL 105 105 102 101  --  105  CO2 29 28 29 30   --  27  GLUCOSE 79 81 82 81  --  98  BUN 26* 20 18 20   --  13  CREATININE 1.03 0.98 0.98 0.97   --  0.74  CALCIUM 8.5* 7.8* 8.4* 8.4*  --  8.1*  MG  --   --  2.0  --  1.9 1.6*  PHOS  --   --   --   --   --  1.4*    GFR: Estimated Creatinine Clearance: 64.1 mL/min (by C-G formula based on SCr of 0.74 mg/dL).  Liver Function Tests: Recent Labs  Lab 04/29/18 0904 05/05/18 1012  AST 27 8*  ALT 13 6  ALKPHOS 75 46  BILITOT 0.3 0.5  PROT 7.9 5.0*  ALBUMIN 3.5 2.2*   Recent Labs  Lab 04/29/18 0904  LIPASE 204*   No results for input(s): AMMONIA in the last 168 hours.  Coagulation Profile: Recent Labs  Lab 04/29/18 0904  INR 1.07  Cardiac Enzymes: No results for input(s): CKTOTAL, CKMB, CKMBINDEX, TROPONINI in the last 168 hours.  BNP (last 3 results) No results for input(s): PROBNP in the last 8760 hours.  HbA1C: No results for input(s): HGBA1C in the last 72 hours.  CBG: Recent Labs  Lab 05/04/18 2339 05/05/18 0133 05/05/18 0423 05/05/18 0757 05/05/18 1208  GLUCAP 60* 109* 76 62* 89    Lipid Profile: Recent Labs    05/05/18 1012  TRIG 91    Thyroid Function Tests: No results for input(s): TSH, T4TOTAL, FREET4, T3FREE, THYROIDAB in the last 72 hours.  Anemia Panel: No results for input(s): VITAMINB12, FOLATE, FERRITIN, TIBC, IRON, RETICCTPCT in the last 72 hours.  Urine analysis:    Component Value Date/Time   COLORURINE AMBER (A) 04/29/2018 1059   APPEARANCEUR CLOUDY (A) 04/29/2018 1059   LABSPEC 1.020 04/29/2018 1059   PHURINE 6.5 04/29/2018 1059   GLUCOSEU NEGATIVE 04/29/2018 1059   HGBUR LARGE (A) 04/29/2018 1059   BILIRUBINUR MODERATE (A) 04/29/2018 1059   KETONESUR 15 (A) 04/29/2018 1059   PROTEINUR 30 (A) 04/29/2018 1059   UROBILINOGEN 1.0 06/08/2010 1523   NITRITE POSITIVE (A) 04/29/2018 1059   LEUKOCYTESUR MODERATE (A) 04/29/2018 1059    Sepsis Labs: Lactic Acid, Venous    Component Value Date/Time   LATICACIDVEN 1.07 04/29/2018 1334    MICROBIOLOGY: Recent Results (from the past 240 hour(s))  Culture, Urine      Status: Abnormal   Collection Time: 04/29/18 11:57 AM  Result Value Ref Range Status   Specimen Description URINE, RANDOM  Final   Special Requests   Final    NONE Performed at [facility], [address], [place], [place] [address]    Culture >=100,000 COLONIES/mL KLEBSIELLA PNEUMONIAE (A)  Final   Report Status 05/01/2018 FINAL  Final   Organism ID, Bacteria KLEBSIELLA PNEUMONIAE (A)  Final      Susceptibility   Klebsiella pneumoniae - MIC*    AMPICILLIN >=32 RESISTANT Resistant     CEFAZOLIN <=4 SENSITIVE Sensitive     CEFTRIAXONE <=1 SENSITIVE Sensitive     CIPROFLOXACIN <=0.25 SENSITIVE Sensitive     GENTAMICIN <=1 SENSITIVE Sensitive     IMIPENEM 0.5 SENSITIVE Sensitive     NITROFURANTOIN 64 INTERMEDIATE Intermediate     TRIMETH/SULFA <=20 SENSITIVE Sensitive     AMPICILLIN/SULBACTAM 16 INTERMEDIATE Intermediate     PIP/TAZO 16 SENSITIVE Sensitive     Extended ESBL NEGATIVE Sensitive     * >=100,000 COLONIES/mL KLEBSIELLA PNEUMONIAE  Culture, blood (routine x 2)     Status: None   Collection Time: 04/29/18  1:25 PM  Result Value Ref Range Status   Specimen Description BLOOD RIGHT ANTECUBITAL  Final   Special Requests   Final    BOTTLES DRAWN AEROBIC AND ANAEROBIC Blood Culture results may not be optimal due to an excessive volume of blood received in culture bottles   Culture   Final    NO GROWTH 5 DAYS Performed at [facility], [address] [place], [place] [address]    Report Status 05/04/2018 FINAL  Final  Culture, blood (routine x 2)     Status: None   Collection Time: 04/30/18  6:18 AM  Result Value Ref Range Status   Specimen Description BLOOD RIGHT ANTECUBITAL  Final   Special Requests   Final    BOTTLES DRAWN AEROBIC ONLY Blood Culture adequate volume   Culture   Final    NO GROWTH 5 DAYS Performed  at [facility], [address], [place], [place] [address]    Report Status 05/05/2018 FINAL  Final    RADIOLOGY STUDIES/RESULTS: Ct  Abdomen Pelvis Wo Contrast  Result Date: 04/30/2018 CLINICAL DATA:  Nausea, bilious vomiting EXAM: CT ABDOMEN AND PELVIS WITHOUT CONTRAST TECHNIQUE: Multidetector CT imaging of the abdomen and pelvis was performed following the standard protocol without IV contrast. Sagittal and coronal MPR images reconstructed from axial data set. Oral contrast was not administered. COMPARISON:  None FINDINGS: Lower chest: Emphysematous changes with BILATERAL lower lobe infiltrates consistent with either pneumonia or aspiration. Hepatobiliary: Liver unremarkable. Gallbladder not well visualized due to streak artifacts, grossly unremarkable. Pancreas: Normal appearance Spleen: Normal appearance Adrenals/Urinary Tract: Question adrenal thickening bilaterally. No obvious renal mass or hydronephrosis. Ureters not visualized. Foley catheter decompresses urinary bladder. Stomach/Bowel: Nasogastric tube in stomach. Low-attenuation fluid distends stomach and multiple proximal small bowel loops, with small bowel loops up to 4.1 cm diameter. Distal small bowel loops and colon are decompressed. Scattered stool throughout colon. Findings likely represent mid small bowel obstruction. Appendix not visualized. Suspected rectal wall thickening. Vascular/Lymphatic: Atherosclerotic calcification aorta. Aorta normal caliber. Reproductive: Minimal prostatic enlargement and scattered prostatic calcifications. Other: No definite free air or free fluid. Nonspecific stranding of presacral fat. Musculoskeletal: No acute osseous findings. IMPRESSION: Dilated proximal and decompressed distal small bowel loops compatible with small bowel obstruction, likely in the mid small bowel. Due to lack of fat planes, streak artifacts, and lack of contrast, unable to localize site of obstruction. No evidence of perforation/free air. Question rectal wall thickening, recommend correlation with proctoscopy. Emphysematous changes with bibasilar pulmonary infiltrates  either representing pneumonia or aspiration. Electronically Signed   By: Ulyses Southward M.D.   On: 04/30/2018 19:38   Dg Chest 2 View  Result Date: 04/12/2018 CLINICAL DATA:  Fever of unknown origin. EXAM: CHEST - 2 VIEW COMPARISON:  None. FINDINGS: The heart size is within normal limits. Ectasia of the thoracic aorta is noted. Pulmonary hyperinflation is seen, consistent with COPD. Mild scarring noted at right lung base as well as several old right rib fracture deformities. No evidence of pulmonary infiltrate, edema, or pleural effusion. IMPRESSION: COPD.  No active cardiopulmonary disease. Electronically Signed   By: Myles Rosenthal M.D.   On: 04/12/2018 19:16   Dg Abd 1 View  Result Date: 05/03/2018 CLINICAL DATA:  Small bowel obstruction EXAM: ABDOMEN - 1 VIEW COMPARISON:  05/02/2018 FINDINGS: NG tube is in the stomach. Gaseous distention of the stomach and left abdominal small bowel loops. Gas and stool within nondistended colon. No free air or organomegaly. IMPRESSION: Gaseous distention of stomach and left abdominal small bowel loops likely reflecting small bowel obstruction. NG tube is in the stomach. Electronically Signed   By: Charlett Nose M.D.   On: 05/03/2018 10:05   Dg Abd 1 View  Result Date: 05/02/2018 CLINICAL DATA:  Small bowel obstruction EXAM: ABDOMEN - 1 VIEW COMPARISON:  May 01, 2018 abdominal radiograph and CT abdomen and pelvis April 30, 2018 FINDINGS: Nasogastric tube no longer present. There is slightly less bowel dilatation in the left upper quadrant compared to 1 day prior. There is moderate air in the stomach. No bowel dilatation elsewhere. No air-fluid levels. No free air. There is moderate stool in the colon. There is atelectatic change in the right lung base. IMPRESSION: Nasogastric tube no longer appreciable. Less small bowel dilatation in the left upper quadrant. Question resolving bowel obstruction. No free air evident. Electronically Signed  By: Bretta Bang III M.D.    On: 05/02/2018 14:20   Ct Head Wo Contrast  Result Date: 04/29/2018 CLINICAL DATA:  Altered level of consciousness increased over last 2 days, 2 episodes of nausea and vomiting, history of prior brain injury, dementia, former smoker, hypertension EXAM: CT HEAD WITHOUT CONTRAST TECHNIQUE: Contiguous axial images were obtained from the base of the skull through the vertex without intravenous contrast. COMPARISON:  06/08/2010 FINDINGS: Brain: Generalized atrophy. Normal ventricular morphology. No midline shift or mass effect. Small vessel chronic ischemic changes of deep cerebral white matter. 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 No intracranial hemorrhage, mass lesion, evidence of acute infarction, or extra-axial fluid collection. Vascular: Minimal atherosclerotic calcification of internal carotid arteries at skull base Skull: Intact Sinuses/Orbits: Clear Other: N/A IMPRESSION: Atrophy with small vessel chronic ischemic changes of deep cerebral white matter. 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 No acute intracranial abnormalities. Electronically Signed   By: Ulyses Southward M.D.   On: 04/29/2018 09:58   Dg Chest Port 1 View  Result Date: 04/29/2018 CLINICAL DATA:  Altered mental status EXAM: PORTABLE CHEST 1 VIEW COMPARISON:  April 12, 2018 FINDINGS: There is mild scarring in the lateral right base and in the medial right lower lung region. There is no edema or consolidation. The heart size and pulmonary vascularity are normal. No adenopathy. There is a recent appearing fracture of the posterior right seventh rib with alignment essentially anatomic. An older fracture of the posterior right eighth rib is noted. No pneumothorax. IMPRESSION: Recent appearing fracture posterior right seventh rib. Older fracture posterior right eighth rib noted. Areas of mild scarring noted on the right. No edema or consolidation. Stable cardiac silhouette. Electronically Signed   By: Bretta Bang  III M.D.   On: 04/29/2018 08:46   Dg Chest Port 1v Same Day  Result Date: 05/01/2018 CLINICAL DATA:  history of schizophrenia, hypertension, BPH-mostly wheelchair-bound-presented to the hospital with approximately 1 week history of intermittent vomiting, confusion for a few days prior to this hospital admission-presented with sepsis secondary to UTI, and significant constipation. C/o SOB EXAM: PORTABLE CHEST - 1 VIEW SAME DAY COMPARISON:  04/29/2018 FINDINGS: Lungs are hyperinflated. Subsegmental atelectasis or early infiltrate at the left lung base, new since previous. Right lung clear. Heart size normal.  Tortuous ectatic thoracic aorta. No effusion. No pneumothorax. Right anterolateral fourth rib fracture, age indeterminate. Nasogastric tube extends to the stomach. IMPRESSION: 1. New patchy infiltrate or subsegmental atelectasis at the left lung base. 2. Right fourth rib fracture without pneumothorax. 3. Nasogastric tube placement to the stomach. Electronically Signed   By: Corlis Leak M.D.   On: 05/01/2018 08:24   Dg Abd 2 Views  Result Date: 05/05/2018 CLINICAL DATA:  Vomiting.  History of right lumpectomy. EXAM: ABDOMEN - 2 VIEW COMPARISON:  05/04/2018 FINDINGS: Improve dilation of small bowel loops with residual borderline gas-filled distended small bowel loops in the left upper quadrant of the abdomen. Normal colonic bowel gas pattern. No evidence of free intra-abdominal gas. Normal cardiac silhouette. Tortuosity of the thoracic aorta. Right pleural reflection versus small pleural effusion. No lobar airspace consolidation. Enteric catheter tip within the expected location the gastric body, side hole at the GE junction level. Right PICC line terminates in the expected location of the cavoatrial junction. IMPRESSION: Improved appearance of small-bowel obstruction. Enteric catheter with side hole at the level of the GE junction. Advancement may be considered. Possible small right pleural effusion.  Electronically Signed  By: Ted Mcalpine M.D.   On: 05/05/2018 12:51   Dg Abd Acute W/chest  Result Date: 05/04/2018 CLINICAL DATA:  Follow-up small bowel obstruction. EXAM: DG ABDOMEN ACUTE W/ 1V CHEST COMPARISON:  Abdominal x-rays 05/04/1999 19 dating back to 04/29/2018. CT abdomen and pelvis 04/30/2018. Chest x-rays 05/01/2018, 04/12/2018. FINDINGS: Nasogastric tube tip in the fundus of the stomach. Persistent marked gaseous distension of the jejunum in the LEFT UPPER QUADRANT, slowly progressive over the past 3 days, with air-fluid levels on the LATERAL decubitus image. No evidence of free intraperitoneal air. Normal caliber colon with moderate stool burden. Calcified uterine fibroid in the pelvic midline. Cardiac silhouette normal in size, unchanged. Thoracic aorta tortuous and atherosclerotic. Hilar and mediastinal contours otherwise unremarkable. Interval improvement in aeration in the LEFT lung base, with only minimal patchy opacities persisting. Lungs otherwise clear. Emphysematous changes in both lungs with scarring in the RIGHT mid lung, unchanged. Pleuroparenchymal scarring at the RIGHT lung base which accounts for the blunting of the costophrenic angle, unchanged dating back to the original 04/12/2018 examination. IMPRESSION: 1. Persistent partial small bowel obstruction which has slowly worsened over the past 3 days. 2. No evidence of free intraperitoneal air. 3. Improved aeration in the LEFT LOWER LOBE, with only mild atelectasis and/or pneumonia persisting. Electronically Signed   By: Hulan Saas M.D.   On: 05/04/2018 09:28   Dg Abd Portable 1v-small Bowel Obstruction Protocol-initial, 8 Hr Delay  Result Date: 05/04/2018 CLINICAL DATA:  Small bowel obstruction protocol. 8 hour delay. EXAM: PORTABLE ABDOMEN - 1 VIEW COMPARISON:  05/03/2018 FINDINGS: Gaseous distention of left upper quadrant small bowel. Enteric tube with tip projected over the mid abdomen consistent with location  in the upper stomach. Contrast material is demonstrated in the dilated small bowel. No contrast material is identified in the colon. This is suggestive of high-grade obstruction. Degenerative changes in the spine. IMPRESSION: Contrast material is demonstrated in the dilated small bowel but not in the colon consistent with high-grade obstruction. Electronically Signed   By: Burman Nieves M.D.   On: 05/04/2018 01:55   Dg Abd Portable 1v-small Bowel Obstruction Protocol-initial, 8 Hr Delay  Result Date: 05/01/2018 CLINICAL DATA:  Small bowel obstruction. EXAM: PORTABLE ABDOMEN - 1 VIEW COMPARISON:  Radiograph earlier this day at 1109 hour, CT yesterday. FINDINGS: Enteric tube in place with tip in the stomach, side-port just beyond the gastroesophageal junction. Improving small bowel dilatation in the left abdomen. Moderate stool in the right and transverse colon. No evidence of free air. Pelvic calcification may be stone in the urinary bladder or exophytic prostate calcification. IMPRESSION: Improving small bowel dilatation in the left abdomen since radiograph earlier this day. Electronically Signed   By: Narda Rutherford M.D.   On: 05/01/2018 21:14   Dg Abd Portable 1v-small Bowel Protocol-position Verification  Result Date: 05/01/2018 CLINICAL DATA:  Nasogastric tube placement EXAM: PORTABLE ABDOMEN - 1 VIEW COMPARISON:  Portable exam 1109 hours compared to 04/30/2018 FINDINGS: Tip of nasogastric tube projects over mid stomach. Dilated small bowel loops are seen in the LEFT mid abdomen. Increased stool in RIGHT colon. Lung bases appear emphysematous with subsegmental atelectasis at LEFT base. Bones demineralized. IMPRESSION: Tip of nasogastric tube projects over mid stomach. Small bowel dilatation in LEFT mid abdomen with increased stool in RIGHT colon. Electronically Signed   By: Ulyses Southward M.D.   On: 05/01/2018 11:22   Dg Abd Portable 1v  Result Date: 04/30/2018 CLINICAL DATA:  67 year old male with  vomiting. EXAM: PORTABLE  ABDOMEN - 1 VIEW COMPARISON:  04/29/2018 abdominal radiographs. FINDINGS: Portable AP semi upright and supine views at 0847 hours. Enteric tube has been placed and the stomach now appears decompressed. NG tube side hole at the gastric body. Non obstructed bowel gas pattern. Moderate volume of retained stool redemonstrated in the transverse colon and distal sigmoid/rectum. Negative visible lung bases. No pneumoperitoneum. Stable abdominal and pelvic visceral contours. Dystrophic calcification in the pelvis. Stable pelvic catheter, likely a Foley. No acute osseous abnormality identified. IMPRESSION: 1. NG tube in place with resolved gastric distention. 2. Non obstructed bowel gas pattern with moderate volume of retained stool. Electronically Signed   By: Odessa Fleming M.D.   On: 04/30/2018 09:06   Dg Abd Portable 1v  Result Date: 04/29/2018 CLINICAL DATA:  Nausea and vomiting EXAM: PORTABLE ABDOMEN - 1 VIEW COMPARISON:  None. FINDINGS: Gaseous distention of the stomach. Formed stool throughout the colon. No concerning mass effect or calcification. IMPRESSION: Prominent gaseous distension of the stomach. Constipated appearance. Electronically Signed   By: Marnee Spring M.D.   On: 04/29/2018 14:44   [place] Ekg Site Rite  Result Date: 05/04/2018 If Site Rite image not attached, placement could not be confirmed due to current cardiac rhythm.    LOS: 6 days   Jeoffrey Massed, MD  [facility]  If 7PM-7AM, please contact night-coverage  Please page via [email or web address]-click on MD name and type text message  05/05/2018, 3:16 PM

## 2018-05-05 NOTE — Progress Notes (Signed)
Pt's sister called this RN early in the shift with the following:  Sister: hello, who are you? RN: I am the nurse working with your brother for the night Sister: do you know who I am, have you asked someone about me? Because I am an attorney  RN: No mam Sister: find out who I am before I get there because when I come, I will determine if I like you or not so know who I am RN: ok mam, I am here to take care of your brother and get address any concern when you arrive Sister: just know that I have spoken to the president of the hospital and he knows that I have several cases against some of the nurses (because I am an attorney), and you can be one to. I will be up there soon, you got that? RN. [place], [address]

## 2018-05-05 NOTE — Progress Notes (Addendum)
Dr. Jerral Ralph was notified that patients Kcl was 6.4.  He will put new orders on the chart.

## 2018-05-05 NOTE — Progress Notes (Signed)
Peripherally Inserted Central Catheter/Midline Placement  The IV Nurse has discussed with the patient and/or persons authorized to consent for the patient, the purpose of this procedure and the potential benefits and risks involved with this procedure.  The benefits include less needle sticks, lab draws from the catheter, and the patient may be discharged home with the catheter. Risks include, but not limited to, infection, bleeding, blood clot (thrombus formation), and puncture of an artery; nerve damage and irregular heartbeat and possibility to perform a PICC exchange if needed/ordered by physician.  Alternatives to this procedure were also discussed.  B[employer] PICC patient education guide, fact sheet on infection prevention and patient information card has been provided to patient /or left at bedside.    PICC/Midline Placement Documentation    Consent obtained with sister at bedside    Jeffrey Davila 05/05/2018, 8:26 AM

## 2018-05-06 ENCOUNTER — Encounter (HOSPITAL_COMMUNITY): Payer: Self-pay | Admitting: General Surgery

## 2018-05-06 ENCOUNTER — Inpatient Hospital Stay (HOSPITAL_COMMUNITY): Payer: Medicare Other

## 2018-05-06 LAB — PHOSPHORUS: PHOSPHORUS: 2.9 mg/dL (ref 2.5–4.6)

## 2018-05-06 LAB — CBC
HEMATOCRIT: 25.4 % — AB (ref 39.0–52.0)
HEMOGLOBIN: 8.4 g/dL — AB (ref 13.0–17.0)
MCH: 31.1 pg (ref 26.0–34.0)
MCHC: 33.1 g/dL (ref 30.0–36.0)
MCV: 94.1 fL (ref 80.0–100.0)
NRBC: 0 % (ref 0.0–0.2)
Platelets: 516 10*3/uL — ABNORMAL HIGH (ref 150–400)
RBC: 2.7 MIL/uL — ABNORMAL LOW (ref 4.22–5.81)
RDW: 15.2 % (ref 11.5–15.5)
WBC: 14.2 10*3/uL — AB (ref 4.0–10.5)

## 2018-05-06 LAB — BASIC METABOLIC PANEL
Anion gap: 9 (ref 5–15)
BUN: 10 mg/dL (ref 8–23)
CALCIUM: 8.3 mg/dL — AB (ref 8.9–10.3)
CO2: 29 mmol/L (ref 22–32)
Chloride: 104 mmol/L (ref 98–111)
Creatinine, Ser: 0.73 mg/dL (ref 0.61–1.24)
GFR calc Af Amer: >60 mL/min (ref >60)
GFR calc non Af Amer: >60 mL/min (ref >60)
GLUCOSE: 106 mg/dL — AB (ref 70–99)
POTASSIUM: 3.4 mmol/L — AB (ref 3.5–5.1)
Sodium: 142 mmol/L (ref 135–145)

## 2018-05-06 LAB — MAGNESIUM: MAGNESIUM: 2.3 mg/dL (ref 1.7–2.4)

## 2018-05-06 LAB — GLUCOSE, CAPILLARY
GLUCOSE-CAPILLARY: 122 mg/dL — AB (ref 70–99)
Glucose-Capillary: 105 mg/dL — ABNORMAL HIGH (ref 70–99)
Glucose-Capillary: 107 mg/dL — ABNORMAL HIGH (ref 70–99)
Glucose-Capillary: 109 mg/dL — ABNORMAL HIGH (ref 70–99)
Glucose-Capillary: 132 mg/dL — ABNORMAL HIGH (ref 70–99)
Glucose-Capillary: 91 mg/dL (ref 70–99)

## 2018-05-06 MED ORDER — POTASSIUM CHLORIDE 10 MEQ/50ML IV SOLN
10.0000 meq | INTRAVENOUS | Status: AC
  Filled 2018-05-06 (×2): qty 50

## 2018-05-06 MED ORDER — POTASSIUM CHLORIDE 10 MEQ/100ML IV SOLN
INTRAVENOUS | Status: AC
  Administered 2018-05-06: 10 meq
  Filled 2018-05-06: qty 100

## 2018-05-06 MED ORDER — TRAVASOL 10 % IV SOLN: INTRAVENOUS | Status: DC

## 2018-05-06 MED ORDER — POTASSIUM CHLORIDE 10 MEQ/100ML IV SOLN
INTRAVENOUS | Status: AC
  Administered 2018-05-06: 10:00:00
  Filled 2018-05-06: qty 100

## 2018-05-06 MED ORDER — TRAVASOL 10 % IV SOLN
INTRAVENOUS | Status: AC
  Administered 2018-05-06: 18:00:00 via INTRAVENOUS
  Filled 2018-05-06: qty 936

## 2018-05-06 NOTE — Progress Notes (Signed)
Physical Therapy Treatment Patient Details Name: Jeffrey Davila MRN: [id number] DOB: August 23, 1950 Today's Date: 05/06/2018    History of Present Illness Pt is a 67 y/o male admitted secondary to AMS and Sepsis most likely secondary to UTI. CT negative for acute abnormality. PMH inlcudes BI, dementia, HTN, and schizophrenia.     PT Comments    Pt performed transfer from bed to recliner with recliner further away from bed to allow for increased ambulation.  Pt refused further gait training after transferring to chair.  Pt with productive coughs during mobility and PTA suctioned mouth to remove secretions.      Follow Up Recommendations  Home health PT;Supervision/Assistance - 24 hour     Equipment Recommendations  Rolling walker with 5" wheels;Hospital bed    Recommendations for Other Services       Precautions / Restrictions Precautions Precautions: Fall;Other (comment) Precaution Comments: NG tube, RN removed from suction during tx.   Restrictions Weight Bearing Restrictions: No    Mobility  Bed Mobility Overal bed mobility: Needs Assistance       Supine to sit: Mod assist     General bed mobility comments: Cues for forward weight shifting to advance LEs to edge of bed and to elevate trunk into sitting.    Transfers Overall transfer level: Needs assistance Equipment used: None Transfers: Sit to/from [employer] Sit to Stand: Min assist;+2 safety/equipment Stand pivot transfers: Min assist;+2 safety/equipment       General transfer comment: Cues for forward weight shifting and bearing weight through B LEs.  When sitting to seated surface he was able to follow commands for hand placement.    Ambulation/Gait Ambulation/Gait assistance: Min assist;+2 safety/equipment Gait Distance (Feet): 6 Feet(Pt started to ambulate toward the window and  to turn and back to recliner.  ) Assistive device: 1 person hand held assist(Pt held to therapist hand with  both hand, would benefit from a RW next session.  ) Gait Pattern/deviations: Step-to pattern;Shuffle;Trunk flexed     General Gait Details: Assist to turn and back to seat.  +2 for line/lead management.  Pt paused at 3 foot and stared out the window for 2 minutes.     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 Pt able to follow simple one step commands. Was only oriented to person.       Exercises      General Comments        Pertinent Vitals/Pain Pain Assessment: No/denies pain Faces Pain Scale: No hurt    Home Living                      Prior Function  PT Goals (current goals can now be found in the care plan section) Acute Rehab PT Goals Patient Stated Goal: per sister to go home, get stronger  Potential to Achieve Goals: Fair Progress towards PT goals: Progressing toward goals    Frequency    Min 3X/week      PT Plan Current plan remains appropriate    Co-evaluation              AM-PAC PT "6 Clicks" Daily Activity  Outcome Measure  Difficulty turning over in bed (including adjusting bedclothes, sheets and blankets)?: Unable Difficulty moving from lying on back to sitting on the side of the bed? : Unable Difficulty sitting down on and standing up from a chair with arms (e.g., wheelchair, bedside commode, etc,.)?: Unable Help needed moving to and from a bed to chair (including  a wheelchair)?: A Little Help needed walking in hospital room?: A Lot Help needed climbing 3-5 steps with a railing? : Total 6 Click Score: 9    End of Session Equipment Utilized During Treatment: Gait belt Activity Tolerance: Patient tolerated treatment well Patient left: in chair;with call bell/phone within reach;with family/visitor present;with nursing/sitter in room Nurse Communication: Mobility status PT Visit Diagnosis: Unsteadiness on feet (R26.81);Muscle weakness (generalized) (M62.81);Difficulty in walking, not elsewhere classified (R26.2)     Time: 1610-9604 PT Time Calculation (min) (ACUTE ONLY): 19 min  Charges:  $Therapeutic Activity: 8-22 mins                     Jeffrey Davila, PTA Acute Rehabilitation Services Pager [phone] Office [phone]     Jeffrey Davila [name] 05/06/2018, 1:05 PM

## 2018-05-06 NOTE — Progress Notes (Addendum)
PHARMACY - ADULT TOTAL PARENTERAL NUTRITION CONSULT NOTE   Pharmacy Consult for TPN Indication: Bowel obstruction  Patient Measurements: Height: 5\' 6"  (167.6 cm) Weight: 110 lb (49.9 kg) IBW/kg (Calculated) : 63.8 TPN AdjBW (KG): 49.9 Body mass index is 17.75 kg/m.  Assessment:  3 YOM admitted on 10/4 with sepsis. He was found to have pneumonia and a possible SBO on CT on 10/5. Pt has a history of SBO s/p colectomy in 1993. An NG tube was placed with improving small bowel dilatation but patient had 3-4 episodes of vomiting overnight on 10/7-10/8 and the NG tube had to be replaced due to incorrect placement. Pharmacy now consulted to start TPN due to worsening SBO and concern for adhesions that will require surgery.   Of note, TPN was prepared on 10/9 but not administered since patient refused a PICC line.   GI: KUB on 10/9 shows persistent SBO which has worsened. Surgery recommended however sister is not agreeable currently. NG output down to . Of note, patient is extremely cachectic. Actual body is significantly below ideal body weight. Albumin 3.5 >> 2.2 Endo: No h/o DM. CBGs <150, had low prior to TPN.  Insulin requirements in the past 24 hours: 8 units Lytes:K 6.4 >> 3.4 s/p stopping KCl supp, Mg 2.3, Phos 2.9 Renal: SCr 0.73, UOP 0.8 ml/kg/hr, net -0.84L Pulm: on RA  Cards: VSS Hepatobil: LFTs wnl, Lipase elevated 10/4, TG wnl, prealbumin 11.1 Neuro: benztropine, Zyprexa ODT, IV valproate ID: WBC 15.8, on D#8 of ceftriaxone, D#6 flagyl for klebsiella UTI and aspiration pneumonia   TPN Access: double lumen PICC placed 10/10 TPN start date: 05/04/18 >>  Nutritional Goals (per RD recommendation on 10/4): Kcal:  1550-1750 Protein:  85-100 grams Fluid:  >1.5 L  Goal TPN rate is 65 ml/hr (provides 1560 mL / 24 hours)  Current Nutrition:  NPO TPN  Plan:  Increase TPN to 65 mL/hr. This TPN provides 93 g of protein, 218 g of dextrose, and 54 g of lipids which provides  1662 kCals per day, meeting 100% of patient needs Electrolytes in TPN: 1:1 Cl:Ac, 35 mEq/L Na, 30 mEq/L K, 5 mEq/L Ca, 8 mEq/L Mg, 15 mmol/L Phos Add MVI, trace elements to TPN Continue sensitive SSI and adjust as needed  Monitor TPN labs in am KCl 10 mEq IV q1h x2  Sister considering transfer to another institution   Loura Back, PharmD, BCPS Clinical Pharmacist Clinical phone for 05/06/2018 until 3p is [phone] Please check AMION for all Pharmacist numbers by unit 05/06/2018 7:24 AM

## 2018-05-06 NOTE — Progress Notes (Signed)
PROGRESS NOTE        PATIENT DETAILS Name: Jeffrey Davila Age: 67 y.o. Sex: male Date of Birth: Feb 16, 1951 Admit Date: 04/29/2018 Admitting Physician Starleen Arms, MD PCP:Uba, Reuel Boom, MD  Brief Narrative: Patient is a 67 y.o. male with history of schizophrenia/?  Cognitive dysfunction (dementia), hypertension, BPH-mostly wheelchair-bound-presented to the hospital with approximately 1 week history of intermittent vomiting, confusion for a few days prior to this hospital admission-presented with sepsis secondary to UTI, vomiting and significant constipation.  Patient was started on IV antibiotics and admitted to the hospital service,initial imaging was suggestive of constipation causing gastric distention-NG tube was placed, however upon further evaluation with a CT scan he was found  to have a small bowel obstruction.  Subjective: No major events overnight-Per charge nurse-who spoke with the patient's sister (POA)-she wants the patient to be treated with TNA before she will agree for surgery.  She is not at bedside this morning-Per nursing staff-she has gone back to [place] today.  Assessment/Plan: Sepsis secondary to complicated UTI: Sepsis pathophysiology has resolved, urine culture positive for Klebsiella-has completed a week of IV Rocephin.  Blood cultures remain negative.   Aspiration pneumonia: Secondary to SBO/vomiting.  Has completed a course of Rocephin/Flagyl. Chest x-ray on 10/9 showed radiographic improvement.  Given generalized frailty/cognitive dysfunction-and issues with recurrent vomiting and intermittent accumulation of oral secretions-he remains at risk of aspiration.  Nursing staff aware to suction as needed, and if patient is able to participate in incentive spirometry to encourage incentive spirometry.  SBO: SBO persists-in spite of n.p.o. status/NG tube decompression.  General surgery following-Per nursing staff-patient's sister wants  TNA for a few days before going for surgery.  Continue supportive care-no family at bedside this morning.  Patient's sister over the past few days was asking for "guarantees" with surgery-when it was explained that such guarantees could not be given-and that patient remained at risk for perioperative complications given his overall frailty she requested a second opinion at [facility].  This MD spoke with [facility] transfer center-however [facility] refused transfer (see notes from 10/9).  Very difficult situation-this MD has had multiple discussions with the patient's sister regarding rationale for surgery, risks inherent to surgery/anesthesia and inherent to any patient with frailty/deconditioning/cognitive dysfunction.  Sister aware that since bowel obstruction is not resolving with conservative means-surgical option may be the only method that remains, and without surgery patient may not have a good outcome.  As noted above-she is clearly aware that surgical intervention comes with the risks that have been explained to her very clearly.  Currently on TNA,this morning charge nurse spoke with patient's sister who currently is in [place] she relayed to [place] that she wanted a few days of TNA before she would consent for surgery.  Acute metabolic encephalopathy: Patient was lethargic and confused on his usual baseline on initial presentation, with supportive care encephalopathy has resolved, I suspect he is back to his usual baseline.   Anemia: Suspect anemia secondary to acute illness, and IV fluid dilution.  Has been transfused 1 unit of PRBC so far.  Continue to follow periodically.  Hyperkalemia: Occurred on 10/10-resolved.   Schizophrenia: Stable-continue IV Depakote and ODT olanzapine.  Is n.p.o.-unable to start benztropine. Note-per pharmacy-hospitalist service is unable to use parenteral olanzapine-there is limited psychiatry and critical care.   Acute kidney injury: Hemodynamically mediated-resolved.  Hypernatremia: Resolved with hypotonic saline.  This was secondary to vomiting and dehydration.  Now on TNA.  Hypertension: Stable-continue to hold antihypertensives.    Severe malnutrition: Once diet has been started-we will start oral supplements-has been started on TNA  Deconditioning/debility: Suspect has significant amount of debility at baseline-he is very cachectic-deconditioning/debility has worsened due to acute illness.  PT evaluation appreciated.  Attempt to mobilize secretions, attempt to mobilize out of bed to chair.     DVT Prophylaxis: Prophylactic Heparin   Code Status: Full code   Family Communication: None at bedside  Disposition Plan: Remain inpatient-not stable for discharge  Antimicrobial agents: Anti-infectives (From admission, onward)   Start     Dose/Rate Route Frequency Ordered Stop   05/01/18 1215  metroNIDAZOLE (FLAGYL) IVPB 500 mg  Status:  Discontinued     500 mg 100 mL/hr over 60 Minutes Intravenous Every 8 hours 05/01/18 1201 05/06/18 1153   04/30/18 1100  cefTRIAXone (ROCEPHIN) 2 g in sodium chloride 0.9 % 100 mL IVPB  Status:  Discontinued     2 g 200 mL/hr over 30 Minutes Intravenous Every 24 hours 04/29/18 1416 05/06/18 1153   04/29/18 1430  cefTRIAXone (ROCEPHIN) 1 g in sodium chloride 0.9 % 100 mL IVPB     1 g 200 mL/hr over 30 Minutes Intravenous Every 24 hours 04/29/18 1416 04/30/18 1429   04/29/18 1130  cefTRIAXone (ROCEPHIN) 1 g in sodium chloride 0.9 % 100 mL IVPB     1 g 200 mL/hr over 30 Minutes Intravenous  Once 04/29/18 1118 04/29/18 1251      Procedures: None  CONSULTS:  None  Time spent: 25 minutes minutes-Greater than 50% of this time was spent in counseling, explanation of diagnosis, planning of further management, and coordination of care.  MEDICATIONS: Scheduled Meds: . benztropine  1 mg Oral BID  . diphenhydrAMINE  25 mg Intravenous Once  . heparin  5,000 Units Subcutaneous Q8H  . insulin aspart  0-9 Units  Subcutaneous Q4H  . OLANZapine zydis  10 mg Oral BID   Continuous Infusions: . sodium chloride 500 mL (05/05/18 1429)  . TPN ADULT (ION) 30 mL/hr at 05/05/18 1754  . TPN ADULT (ION)    . valproate sodium 250 mg (05/06/18 0900)   PRN Meds:.sodium chloride, albuterol, ondansetron (ZOFRAN) IV, sodium chloride flush   PHYSICAL EXAM: Vital signs: Vitals:   05/05/18 1115 05/05/18 1631 05/05/18 2045 05/06/18 0441  BP: 114/80 99/62 102/75 99/66  Pulse: 99 87 76 86  Resp: (!) 22 20  17   Temp: 98.3 F (36.8 C) 98.3 F (36.8 C) 98.6 F (37 C) 98.2 F (36.8 C)  TempSrc:  Oral Oral   SpO2: 97% 98% 97% 94%  Weight:      Height:       Filed Weights   04/29/18 0842  Weight: 49.9 kg   Body mass index is 17.75 kg/m.   General appearance:Awake, alert, not in any distress.  Eyes:no scleral icterus. HEENT: Atraumatic and Normocephalic Neck: supple, no JVD. Resp:Good air entry bilaterally-.  This morning-do not hear any upper airway transmitted sounds. CVS: S1 S2 regular, no murmurs.  GI: Bowel sounds present, non-abdomen soft and nondistended. Extremities: B/L Lower Ext shows no edema, both legs are warm to touch Neurology:  Non focal Musculoskeletal:No digital cyanosis Skin:No Rash, warm and dry Wounds:N/A  I have personally reviewed following labs and imaging studies  LABORATORY DATA: CBC: Recent Labs  Lab 05/02/18 0538 05/03/18 0235 05/04/18 0730 05/05/18 1012  05/06/18 1252  WBC 12.9* 14.8* 18.4* 15.8* 14.2*  NEUTROABS  --   --   --  13.8*  --   HGB 8.3* 9.3* 8.5* 7.4* 8.4*  HCT 25.8* 28.9* 27.0* 24.6* 25.4*  MCV 93.5 93.2 93.4 95.7 94.1  PLT 269 350 374 438* 516*    Basic Metabolic Panel: Recent Labs  Lab 05/03/18 0235 05/04/18 0730 05/04/18 0826 05/05/18 1012 05/05/18 1552 05/06/18 0355  NA 143 141  --  138 140 142  K 2.9* 3.3*  --  6.4* 3.9 3.4*  CL 102 101  --  105 104 104  CO2 29 30  --  27 27 29   GLUCOSE 82 81  --  98 49* 106*  BUN 18 20  --  13  13 10   CREATININE 0.98 0.97  --  0.74 0.76 0.73  CALCIUM 8.4* 8.4*  --  8.1* 8.4* 8.3*  MG 2.0  --  1.9 1.6*  --  2.3  PHOS  --   --   --  1.4*  --  2.9    GFR: Estimated Creatinine Clearance: 64.1 mL/min (by C-G formula based on SCr of 0.73 mg/dL).  Liver Function Tests: Recent Labs  Lab 05/05/18 1012  AST 8*  ALT 6  ALKPHOS 46  BILITOT 0.5  PROT 5.0*  ALBUMIN 2.2*   No results for input(s): LIPASE, AMYLASE in the last 168 hours. No results for input(s): AMMONIA in the last 168 hours.  Coagulation Profile: No results for input(s): INR, PROTIME in the last 168 hours.  Cardiac Enzymes: No results for input(s): CKTOTAL, CKMB, CKMBINDEX, TROPONINI in the last 168 hours.  BNP (last 3 results) No results for input(s): PROBNP in the last 8760 hours.  HbA1C: No results for input(s): HGBA1C in the last 72 hours.  CBG: Recent Labs  Lab 05/05/18 2044 05/06/18 0006 05/06/18 0437 05/06/18 0807 05/06/18 1141  GLUCAP 136* 132* 105* 107* 91    Lipid Profile: Recent Labs    05/05/18 1012  TRIG 91    Thyroid Function Tests: No results for input(s): TSH, T4TOTAL, FREET4, T3FREE, THYROIDAB in the last 72 hours.  Anemia Panel: No results for input(s): VITAMINB12, FOLATE, FERRITIN, TIBC, IRON, RETICCTPCT in the last 72 hours.  Urine analysis:    Component Value Date/Time   COLORURINE AMBER (A) 04/29/2018 1059   APPEARANCEUR CLOUDY (A) 04/29/2018 1059   LABSPEC 1.020 04/29/2018 1059   PHURINE 6.5 04/29/2018 1059   GLUCOSEU NEGATIVE 04/29/2018 1059   HGBUR LARGE (A) 04/29/2018 1059   BILIRUBINUR MODERATE (A) 04/29/2018 1059   KETONESUR 15 (A) 04/29/2018 1059   PROTEINUR 30 (A) 04/29/2018 1059   UROBILINOGEN 1.0 06/08/2010 1523   NITRITE POSITIVE (A) 04/29/2018 1059   LEUKOCYTESUR MODERATE (A) 04/29/2018 1059    Sepsis Labs: Lactic Acid, Venous    Component Value Date/Time   LATICACIDVEN 1.07 04/29/2018 1334    MICROBIOLOGY: Recent Results (from the past  240 hour(s))  Culture, Urine     Status: Abnormal   Collection Time: 04/29/18 11:57 AM  Result Value Ref Range Status   Specimen Description URINE, RANDOM  Final   Special Requests   Final    NONE Performed at [facility], [address] [place], [place] [address]    Culture >=100,000 COLONIES/mL KLEBSIELLA PNEUMONIAE (A)  Final   Report Status 05/01/2018 FINAL  Final   Organism ID, Bacteria KLEBSIELLA PNEUMONIAE (A)  Final      Susceptibility   Klebsiella pneumoniae -  MIC*    AMPICILLIN >=32 RESISTANT Resistant     CEFAZOLIN <=4 SENSITIVE Sensitive     CEFTRIAXONE <=1 SENSITIVE Sensitive     CIPROFLOXACIN <=0.25 SENSITIVE Sensitive     GENTAMICIN <=1 SENSITIVE Sensitive     IMIPENEM 0.5 SENSITIVE Sensitive     NITROFURANTOIN 64 INTERMEDIATE Intermediate     TRIMETH/SULFA <=20 SENSITIVE Sensitive     AMPICILLIN/SULBACTAM 16 INTERMEDIATE Intermediate     PIP/TAZO 16 SENSITIVE Sensitive     Extended ESBL NEGATIVE Sensitive     * >=100,000 COLONIES/mL KLEBSIELLA PNEUMONIAE  Culture, blood (routine x 2)     Status: None   Collection Time: 04/29/18  1:25 PM  Result Value Ref Range Status   Specimen Description BLOOD RIGHT ANTECUBITAL  Final   Special Requests   Final    BOTTLES DRAWN AEROBIC AND ANAEROBIC Blood Culture results may not be optimal due to an excessive volume of blood received in culture bottles   Culture   Final    NO GROWTH 5 DAYS Performed at [facility], [address] [place], [place] [address]    Report Status 05/04/2018 FINAL  Final  Culture, blood (routine x 2)     Status: None   Collection Time: 04/30/18  6:18 AM  Result Value Ref Range Status   Specimen Description BLOOD RIGHT ANTECUBITAL  Final   Special Requests   Final    BOTTLES DRAWN AEROBIC ONLY Blood Culture adequate volume   Culture   Final    NO GROWTH 5 DAYS Performed at [facility], [address], [place], [place] [address]    Report Status 05/05/2018 FINAL  Final     RADIOLOGY STUDIES/RESULTS: Ct Abdomen Pelvis Wo Contrast  Result Date: 04/30/2018 CLINICAL DATA:  Nausea, bilious vomiting EXAM: CT ABDOMEN AND PELVIS WITHOUT CONTRAST TECHNIQUE: Multidetector CT imaging of the abdomen and pelvis was performed following the standard protocol without IV contrast. Sagittal and coronal MPR images reconstructed from axial data set. Oral contrast was not administered. COMPARISON:  None FINDINGS: Lower chest: Emphysematous changes with BILATERAL lower lobe infiltrates consistent with either pneumonia or aspiration. Hepatobiliary: Liver unremarkable. Gallbladder not well visualized due to streak artifacts, grossly unremarkable. Pancreas: Normal appearance Spleen: Normal appearance Adrenals/Urinary Tract: Question adrenal thickening bilaterally. No obvious renal mass or hydronephrosis. Ureters not visualized. Foley catheter decompresses urinary bladder. Stomach/Bowel: Nasogastric tube in stomach. Low-attenuation fluid distends stomach and multiple proximal small bowel loops, with small bowel loops up to 4.1 cm diameter. Distal small bowel loops and colon are decompressed. Scattered stool throughout colon. Findings likely represent mid small bowel obstruction. Appendix not visualized. Suspected rectal wall thickening. Vascular/Lymphatic: Atherosclerotic calcification aorta. Aorta normal caliber. Reproductive: Minimal prostatic enlargement and scattered prostatic calcifications. Other: No definite free air or free fluid. Nonspecific stranding of presacral fat. Musculoskeletal: No acute osseous findings. IMPRESSION: Dilated proximal and decompressed distal small bowel loops compatible with small bowel obstruction, likely in the mid small bowel. Due to lack of fat planes, streak artifacts, and lack of contrast, unable to localize site of obstruction. No evidence of perforation/free air. Question rectal wall thickening, recommend correlation with proctoscopy. Emphysematous changes with  bibasilar pulmonary infiltrates either representing pneumonia or aspiration. Electronically Signed   By: Ulyses Southward M.D.   On: 04/30/2018 19:38   Dg Chest 2 View  Result Date: 04/12/2018 CLINICAL DATA:  Fever of unknown origin. EXAM: CHEST - 2 VIEW COMPARISON:  None. FINDINGS: The heart size is within normal limits.  Ectasia of the thoracic aorta is noted. Pulmonary hyperinflation is seen, consistent with COPD. Mild scarring noted at right lung base as well as several old right rib fracture deformities. No evidence of pulmonary infiltrate, edema, or pleural effusion. IMPRESSION: COPD.  No active cardiopulmonary disease. Electronically Signed   By: Myles Rosenthal M.D.   On: 04/12/2018 19:16   Dg Abd 1 View  Result Date: 05/03/2018 CLINICAL DATA:  Small bowel obstruction EXAM: ABDOMEN - 1 VIEW COMPARISON:  05/02/2018 FINDINGS: NG tube is in the stomach. Gaseous distention of the stomach and left abdominal small bowel loops. Gas and stool within nondistended colon. No free air or organomegaly. IMPRESSION: Gaseous distention of stomach and left abdominal small bowel loops likely reflecting small bowel obstruction. NG tube is in the stomach. Electronically Signed   By: Charlett Nose M.D.   On: 05/03/2018 10:05   Dg Abd 1 View  Result Date: 05/02/2018 CLINICAL DATA:  Small bowel obstruction EXAM: ABDOMEN - 1 VIEW COMPARISON:  May 01, 2018 abdominal radiograph and CT abdomen and pelvis April 30, 2018 FINDINGS: Nasogastric tube no longer present. There is slightly less bowel dilatation in the left upper quadrant compared to 1 day prior. There is moderate air in the stomach. No bowel dilatation elsewhere. No air-fluid levels. No free air. There is moderate stool in the colon. There is atelectatic change in the right lung base. IMPRESSION: Nasogastric tube no longer appreciable. Less small bowel dilatation in the left upper quadrant. Question resolving bowel obstruction. No free air evident. Electronically Signed    By: Bretta Bang III M.D.   On: 05/02/2018 14:20   Ct Head Wo Contrast  Result Date: 04/29/2018 CLINICAL DATA:  Altered level of consciousness increased over last 2 days, 2 episodes of nausea and vomiting, history of prior brain injury, dementia, former smoker, hypertension EXAM: CT HEAD WITHOUT CONTRAST TECHNIQUE: Contiguous axial images were obtained from the base of the skull through the vertex without intravenous contrast. COMPARISON:  06/08/2010 FINDINGS: Brain: Generalized atrophy. Normal ventricular morphology. No midline shift or mass effect. Small vessel chronic ischemic changes of deep cerebral white matter. 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 No intracranial hemorrhage, mass lesion, evidence of acute infarction, or extra-axial fluid collection. Vascular: Minimal atherosclerotic calcification of internal carotid arteries at skull base Skull: Intact Sinuses/Orbits: Clear Other: N/A IMPRESSION: Atrophy with small vessel chronic ischemic changes of deep cerebral white matter. 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 No acute intracranial abnormalities. Electronically Signed   By: Ulyses Southward M.D.   On: 04/29/2018 09:58   Dg Chest Port 1 View  Result Date: 04/29/2018 CLINICAL DATA:  Altered mental status EXAM: PORTABLE CHEST 1 VIEW COMPARISON:  April 12, 2018 FINDINGS: There is mild scarring in the lateral right base and in the medial right lower lung region. There is no edema or consolidation. The heart size and pulmonary vascularity are normal. No adenopathy. There is a recent appearing fracture of the posterior right seventh rib with alignment essentially anatomic. An older fracture of the posterior right eighth rib is noted. No pneumothorax. IMPRESSION: Recent appearing fracture posterior right seventh rib. Older fracture posterior right eighth rib noted. Areas of mild scarring noted on the right. No edema or consolidation. Stable cardiac silhouette. Electronically  Signed   By: Bretta Bang III M.D.   On: 04/29/2018 08:46   Dg Chest Port 1v Same Day  Result Date: 05/01/2018 CLINICAL DATA:  history of schizophrenia, hypertension, BPH-mostly wheelchair-bound-presented to  the hospital with approximately 1 week history of intermittent vomiting, confusion for a few days prior to this hospital admission-presented with sepsis secondary to UTI, and significant constipation. C/o SOB EXAM: PORTABLE CHEST - 1 VIEW SAME DAY COMPARISON:  04/29/2018 FINDINGS: Lungs are hyperinflated. Subsegmental atelectasis or early infiltrate at the left lung base, new since previous. Right lung clear. Heart size normal.  Tortuous ectatic thoracic aorta. No effusion. No pneumothorax. Right anterolateral fourth rib fracture, age indeterminate. Nasogastric tube extends to the stomach. IMPRESSION: 1. New patchy infiltrate or subsegmental atelectasis at the left lung base. 2. Right fourth rib fracture without pneumothorax. 3. Nasogastric tube placement to the stomach. Electronically Signed   By: Corlis Leak M.D.   On: 05/01/2018 08:24   Dg Abd 2 Views  Result Date: 05/05/2018 CLINICAL DATA:  Vomiting.  History of right lumpectomy. EXAM: ABDOMEN - 2 VIEW COMPARISON:  05/04/2018 FINDINGS: Improve dilation of small bowel loops with residual borderline gas-filled distended small bowel loops in the left upper quadrant of the abdomen. Normal colonic bowel gas pattern. No evidence of free intra-abdominal gas. Normal cardiac silhouette. Tortuosity of the thoracic aorta. Right pleural reflection versus small pleural effusion. No lobar airspace consolidation. Enteric catheter tip within the expected location the gastric body, side hole at the GE junction level. Right PICC line terminates in the expected location of the cavoatrial junction. IMPRESSION: Improved appearance of small-bowel obstruction. Enteric catheter with side hole at the level of the GE junction. Advancement may be considered. Possible  small right pleural effusion. Electronically Signed   By: Ted Mcalpine M.D.   On: 05/05/2018 12:51   Dg Abd Acute W/chest  Result Date: 05/04/2018 CLINICAL DATA:  Follow-up small bowel obstruction. EXAM: DG ABDOMEN ACUTE W/ 1V CHEST COMPARISON:  Abdominal x-rays 05/04/1999 19 dating back to 04/29/2018. CT abdomen and pelvis 04/30/2018. Chest x-rays 05/01/2018, 04/12/2018. FINDINGS: Nasogastric tube tip in the fundus of the stomach. Persistent marked gaseous distension of the jejunum in the LEFT UPPER QUADRANT, slowly progressive over the past 3 days, with air-fluid levels on the LATERAL decubitus image. No evidence of free intraperitoneal air. Normal caliber colon with moderate stool burden. Calcified uterine fibroid in the pelvic midline. Cardiac silhouette normal in size, unchanged. Thoracic aorta tortuous and atherosclerotic. Hilar and mediastinal contours otherwise unremarkable. Interval improvement in aeration in the LEFT lung base, with only minimal patchy opacities persisting. Lungs otherwise clear. Emphysematous changes in both lungs with scarring in the RIGHT mid lung, unchanged. Pleuroparenchymal scarring at the RIGHT lung base which accounts for the blunting of the costophrenic angle, unchanged dating back to the original 04/12/2018 examination. IMPRESSION: 1. Persistent partial small bowel obstruction which has slowly worsened over the past 3 days. 2. No evidence of free intraperitoneal air. 3. Improved aeration in the LEFT LOWER LOBE, with only mild atelectasis and/or pneumonia persisting. Electronically Signed   By: Hulan Saas M.D.   On: 05/04/2018 09:28   Dg Abd Portable 1v  Result Date: 05/06/2018 CLINICAL DATA:  Small bowel obstruction EXAM: PORTABLE ABDOMEN - 1 VIEW COMPARISON:  Portable exam at 0628 hours compared to 05/05/2018 FINDINGS: Stool scattered throughout colon. Persistent dilatation of small bowel loops in the LEFT mid abdomen up to 4.9 cm diameter. No bowel wall  thickening. Bones demineralized. No definite renal calcifications. IMPRESSION: Persistent small bowel dilatation consistent with small bowel obstruction. Little interval change. Electronically Signed   By: Ulyses Southward M.D.   On: 05/06/2018 10:21   Dg Abd Portable 1v-small Bowel  Obstruction Protocol-initial, 8 Hr Delay  Result Date: 05/04/2018 CLINICAL DATA:  Small bowel obstruction protocol. 8 hour delay. EXAM: PORTABLE ABDOMEN - 1 VIEW COMPARISON:  05/03/2018 FINDINGS: Gaseous distention of left upper quadrant small bowel. Enteric tube with tip projected over the mid abdomen consistent with location in the upper stomach. Contrast material is demonstrated in the dilated small bowel. No contrast material is identified in the colon. This is suggestive of high-grade obstruction. Degenerative changes in the spine. IMPRESSION: Contrast material is demonstrated in the dilated small bowel but not in the colon consistent with high-grade obstruction. Electronically Signed   By: Burman Nieves M.D.   On: 05/04/2018 01:55   Dg Abd Portable 1v-small Bowel Obstruction Protocol-initial, 8 Hr Delay  Result Date: 05/01/2018 CLINICAL DATA:  Small bowel obstruction. EXAM: PORTABLE ABDOMEN - 1 VIEW COMPARISON:  Radiograph earlier this day at 1109 hour, CT yesterday. FINDINGS: Enteric tube in place with tip in the stomach, side-port just beyond the gastroesophageal junction. Improving small bowel dilatation in the left abdomen. Moderate stool in the right and transverse colon. No evidence of free air. Pelvic calcification may be stone in the urinary bladder or exophytic prostate calcification. IMPRESSION: Improving small bowel dilatation in the left abdomen since radiograph earlier this day. Electronically Signed   By: Narda Rutherford M.D.   On: 05/01/2018 21:14   Dg Abd Portable 1v-small Bowel Protocol-position Verification  Result Date: 05/01/2018 CLINICAL DATA:  Nasogastric tube placement EXAM: PORTABLE ABDOMEN - 1  VIEW COMPARISON:  Portable exam 1109 hours compared to 04/30/2018 FINDINGS: Tip of nasogastric tube projects over mid stomach. Dilated small bowel loops are seen in the LEFT mid abdomen. Increased stool in RIGHT colon. Lung bases appear emphysematous with subsegmental atelectasis at LEFT base. Bones demineralized. IMPRESSION: Tip of nasogastric tube projects over mid stomach. Small bowel dilatation in LEFT mid abdomen with increased stool in RIGHT colon. Electronically Signed   By: Ulyses Southward M.D.   On: 05/01/2018 11:22   Dg Abd Portable 1v  Result Date: 04/30/2018 CLINICAL DATA:  67 year old male with vomiting. EXAM: PORTABLE ABDOMEN - 1 VIEW COMPARISON:  04/29/2018 abdominal radiographs. FINDINGS: Portable AP semi upright and supine views at 0847 hours. Enteric tube has been placed and the stomach now appears decompressed. NG tube side hole at the gastric body. Non obstructed bowel gas pattern. Moderate volume of retained stool redemonstrated in the transverse colon and distal sigmoid/rectum. Negative visible lung bases. No pneumoperitoneum. Stable abdominal and pelvic visceral contours. Dystrophic calcification in the pelvis. Stable pelvic catheter, likely a Foley. No acute osseous abnormality identified. IMPRESSION: 1. NG tube in place with resolved gastric distention. 2. Non obstructed bowel gas pattern with moderate volume of retained stool. Electronically Signed   By: Odessa Fleming M.D.   On: 04/30/2018 09:06   Dg Abd Portable 1v  Result Date: 04/29/2018 CLINICAL DATA:  Nausea and vomiting EXAM: PORTABLE ABDOMEN - 1 VIEW COMPARISON:  None. FINDINGS: Gaseous distention of the stomach. Formed stool throughout the colon. No concerning mass effect or calcification. IMPRESSION: Prominent gaseous distension of the stomach. Constipated appearance. Electronically Signed   By: Marnee Spring M.D.   On: 04/29/2018 14:44   [place] Ekg Site Rite  Result Date: 05/04/2018 If Site Rite image not attached, placement could  not be confirmed due to current cardiac rhythm.    LOS: 7 days   Jeoffrey Massed, MD  [facility]  If 7PM-7AM, please contact night-coverage  Please page via [email or web address] [email or web address]-click on MD name  and type text message  05/06/2018, 2:56 PM

## 2018-05-06 NOTE — Progress Notes (Signed)
After speaking with surgeon and Dr. Jerral Ralph, Pt's sister gave pt a red tootsie pop.  She had clear understanding pt was nothing by mouth.  She stated "I was just trying to give him nourishment."  Pt was reeducated that Nothing by mouth means just that.
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
## 2018-05-06 NOTE — Progress Notes (Signed)
Patient continues to progress since initiation of TPN, he appears more alert and verbal with questions.  He denies pain with assess.  Sister and niece at bedside during shift change, his sister continues to be verbally aggressive towards the staff. Charge nurse Ginger and other staff nurses present during report.  Sister voiced concerns for patients cough and secretions, RT called to room to assess patient, no distress noted.  Sister verbalized that she did not want me to care for her brother, I offered apologies and proceeded to leave room, once in the hallway Ginger requested that I returned informing that sister and niece requested that I return to care for patient.  I then entered room and educated the family on patient centered care, and encouraged them to allow staff to focus on the patients needs.  Family verbalized understanding with education.  Patient remained in stable condition throughout shift, sleeping throughout night.  Sister left around 2300 and did not return to facility.  Will continue to monitor.

## 2018-05-07 LAB — CBC
HEMATOCRIT: 26 % — AB (ref 39.0–52.0)
HEMOGLOBIN: 7.9 g/dL — AB (ref 13.0–17.0)
MCH: 28.7 pg (ref 26.0–34.0)
MCHC: 30.4 g/dL (ref 30.0–36.0)
MCV: 94.5 fL (ref 80.0–100.0)
Platelets: 605 10*3/uL — ABNORMAL HIGH (ref 150–400)
RBC: 2.75 MIL/uL — ABNORMAL LOW (ref 4.22–5.81)
RDW: 15.5 % (ref 11.5–15.5)
WBC: 13.9 10*3/uL — AB (ref 4.0–10.5)
nRBC: 0 % (ref 0.0–0.2)

## 2018-05-07 LAB — BASIC METABOLIC PANEL
Anion gap: 8 (ref 5–15)
BUN: 16 mg/dL (ref 8–23)
CALCIUM: 8.6 mg/dL — AB (ref 8.9–10.3)
CHLORIDE: 105 mmol/L (ref 98–111)
CO2: 27 mmol/L (ref 22–32)
CREATININE: 0.69 mg/dL (ref 0.61–1.24)
GFR calc Af Amer: >60 mL/min (ref >60)
GFR calc non Af Amer: >60 mL/min (ref >60)
GLUCOSE: 103 mg/dL — AB (ref 70–99)
Potassium: 3.6 mmol/L (ref 3.5–5.1)
Sodium: 140 mmol/L (ref 135–145)

## 2018-05-07 LAB — GLUCOSE, CAPILLARY
GLUCOSE-CAPILLARY: 101 mg/dL — AB (ref 70–99)
GLUCOSE-CAPILLARY: 114 mg/dL — AB (ref 70–99)
GLUCOSE-CAPILLARY: 116 mg/dL — AB (ref 70–99)
GLUCOSE-CAPILLARY: 95 mg/dL (ref 70–99)
GLUCOSE-CAPILLARY: 99 mg/dL (ref 70–99)
Glucose-Capillary: 95 mg/dL (ref 70–99)
Glucose-Capillary: 103 mg/dL — ABNORMAL HIGH (ref 70–99)

## 2018-05-07 LAB — MAGNESIUM: Magnesium: 2.1 mg/dL (ref 1.7–2.4)

## 2018-05-07 LAB — PHOSPHORUS: Phosphorus: 2.5 mg/dL (ref 2.5–4.6)

## 2018-05-07 MED ORDER — TRAVASOL 10 % IV SOLN
INTRAVENOUS | Status: AC
  Administered 2018-05-07: 18:00:00 via INTRAVENOUS
  Filled 2018-05-07: qty 936

## 2018-05-07 NOTE — Progress Notes (Signed)
PHARMACY - ADULT TOTAL PARENTERAL NUTRITION CONSULT NOTE   Pharmacy Consult for TPN Indication: Bowel obstruction  Patient Measurements: Height: 5\' 6"  (167.6 cm) Weight: 110 lb (49.9 kg) IBW/kg (Calculated) : 63.8 TPN AdjBW (KG): 49.9 Body mass index is 17.75 kg/m.  Assessment:  64 YOM admitted on 10/4 with sepsis. He was found to have pneumonia and a possible SBO on CT on 10/5. Pt has a history of SBO s/p colectomy in 1993. An NG tube was placed with improving small bowel dilatation but patient had 3-4 episodes of vomiting overnight on 10/7-10/8 and the NG tube had to be replaced due to incorrect placement. Pharmacy now consulted to start TPN due to worsening SBO and concern for adhesions that will require surgery.   Of note, TPN was prepared on 10/9 but not administered since patient refused a PICC line.   GI: KUB on 10/9 shows persistent SBO which has worsened. Surgery recommended however sister is not agreeable currently. NG output down to . Of note, patient is extremely cachectic. Actual body is significantly below ideal body weight. Albumin 3.5 >> 2.2 Endo: No h/o DM. CBGs <150, had low prior to TPN.  Insulin requirements in the past 24 hours: 1 unit Lytes: K mostly stable at 3.6. Phos borderline low, other lytes wnl. Renal: SCr stable, UOP good, net ~ -1 L Pulm: on RA  Cards: VSS Hepatobil: LFTs wnl, Lipase elevated 10/4, TG wnl, prealbumin 11.1 Neuro: benztropine, Zyprexa ODT, IV valproate ID: S/p abx for klebsiella UTI and aspiration PNA. Afebrile, WBC down to 13.9.  TPN Access: double lumen PICC placed 10/10 TPN start date: 05/04/18 >>  Nutritional Goals (per RD recommendation on 10/9): Kcal:  1550-1750 Protein:  85-100 grams Fluid:  >1.5 L  Goal TPN rate is 65 ml/hr (provides 1560 mL / 24 hours)  Current Nutrition:  NPO TPN  Plan:  Continue TPN at 65 mL/hr. This TPN provides 94 g of protein, 218 g of dextrose, and 55 g of lipids which provides 1,663  kCals per day, meeting 100% of patient needs Electrolytes in TPN: 1:1 Cl:Ac, 35 mEq/L Na, increase to 50 mEq/L of K, 5 mEq/L Ca, 8 mEq/L Mg, increase to 20 mmol/L Phos Add MVI, trace elements to TPN Continue sensitive SSI and adjust as needed  Monitor TPN labs F/U probable surgery next week  Sister considering transfer to another institution  Enzo Bi, PharmD, [facility] Clinical Pharmacist Phone number [phone] 05/07/2018 7:41 AM

## 2018-05-07 NOTE — Progress Notes (Signed)
OT Cancellation Note  Patient Details Name: Jeffrey Davila MRN: [id number] DOB: 06/18/1951   Cancelled Treatment:    Reason Eval/Treat Not Completed: Fatigue/lethargy limiting ability to participate; pt sleeping soundly upon entering room, able to arouse but only maintaining eyes open for a few moments prior to closing again. Pt's sister present and requesting to allow pt to sleep at this time. Will follow up as schedule permits.  [name], OT [facility] Pager [phone] Office [phone]   [name] 05/07/2018, 12:17 PM

## 2018-05-07 NOTE — Progress Notes (Addendum)
Jeffrey Davila is a 67 y.o. male patient. 1. Hypotension, unspecified hypotension type   2. AKI (acute kidney injury) (HCC)   3. Urinary tract infection without hematuria, site unspecified   4. Nausea and vomiting   5. Vomiting   6. SOB (shortness of breath)   7. Encounter for imaging study to confirm nasogastric (NG) tube placement   8. Small bowel obstruction (HCC)   9. SBO (small bowel obstruction) (HCC)   10. Pneumonia    Past Medical History:  Diagnosis Date  . Anemia   . Anxiety   . Bipolar disorder (HCC)   . Dementia (HCC)   . Depression   . Family history of adverse reaction to anesthesia    "sister and mom:  hard to wake both up; sister died in 2007/12/08 but came back" (05/05/2018)  . History of blood transfusion 1993; 2016/12/07   "both cause blood count was low" (05-May-2018)  . Hypertension   . Pneumonia 12-07-2016   "septic" (05-05-2018)  . Schizophrenia (HCC)    Current Facility-Administered Medications  Medication Dose Route Frequency Provider Last Rate Last Dose  . 0.9 %  sodium chloride infusion   Intravenous PRN Maretta Bees, MD 10 mL/hr at 05/05/18 1429 500 mL at 05/05/18 1429  . albuterol (PROVENTIL) (2.5 MG/3ML) 0.083% nebulizer solution 2.5 mg  2.5 mg Nebulization Q2H PRN Elgergawy, Leana Roe, MD   2.5 mg at 05/03/18 1807  . benztropine (COGENTIN) tablet 1 mg  1 mg Oral BID Maretta Bees, MD   1 mg at 05/06/18 Dec 08, 2223  . heparin injection 5,000 Units  5,000 Units Subcutaneous Q8H Elgergawy, Leana Roe, MD   5,000 Units at 05/07/18 1354  . insulin aspart (novoLOG) injection 0-9 Units  0-9 Units Subcutaneous Q4H Sampson Si, RPH   1 Units at 05/06/18 12/07/2128  . OLANZapine zydis (ZYPREXA) disintegrating tablet 10 mg  10 mg Oral BID Maretta Bees, MD   10 mg at 05/07/18 0951  . ondansetron (ZOFRAN) injection 4 mg  4 mg Intravenous Q6H PRN Schorr, Roma Kayser, NP   4 mg at 05/02/18 07-Dec-2149  . sodium chloride flush (NS) 0.9 % injection 10-40 mL  10-40 mL Intracatheter  PRN Ghimire, Werner Lean, MD      . TPN ADULT (ION)   Intravenous Continuous TPN Ilda Basset, [place] 65 mL/hr at 05/06/18 1735    . TPN ADULT (ION)   Intravenous Continuous TPN Batchelder, Para March, RPH      . valproate (DEPACON) 250 mg in dextrose 5 % 50 mL IVPB  250 mg Intravenous Q6H Ghimire, Shanker M, MD 52.5 mL/hr at 05/07/18 1358 250 mg at 05/07/18 1358   No Known Allergies Active Problems:   Sepsis (HCC)   Protein-calorie malnutrition, severe  Blood pressure 104/75, pulse 98, temperature 98.7 F (37.1 C), temperature source Oral, resp. rate 17, height 5\' 6"  (1.676 m), weight 49.9 kg, SpO2 98 %.  Sister remains hesitant to allow patient to receive bowel resection surgery, however after extensive conversation with sister, she has discussed the situation with her spouse and has decided to go ahead with surgery team on Monday. She feels that he needs the weekend to continue with TPN and get this nutrition. Dr. Jerral Ralph spoke with her and assured her that the patients diagnosed pneumonia has improved and  that he is ready for surgery. This message was reiterated with the sister and she seems to be ready to let the patient have the surgery now. The  surgical team will follow-up with the family on Monday  Camillo Flaming, MSN RN 05/07/2018
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
## 2018-05-07 NOTE — Plan of Care (Addendum)
Progressing  Education: Knowledge of General Education information will  improve  Health Behavior/Discharge Planning: Ability to manage health-related needs will improve  Clinical Measurements: Ability to maintain clinical measurements within normal limits will improve  Will remain free from infection   Diagnostic test results will  improve  Respiratory complications will  improve  Cardiovascular complication will be  avoided  Activity: Risk for activity intolerance will  decrease  Nutrition: Adequate nutrition will be  maintained  Coping: Level of anxiety will  decrease  Elimination: Will not experience complications related to bowel  motility  Will not experience complications related to urinary  retention  Pain Managment: General experience of comfort will  improve  Safety: Ability to remain free from injury will  improve   Skin Integrity: Risk for impaired skin integrity will  decrease

## 2018-05-07 NOTE — Progress Notes (Signed)
PROGRESS NOTE        PATIENT DETAILS Name: Jeffrey Davila Age: 67 y.o. Sex: male Date of Birth: Mar 18, 1951 Admit Date: 04/29/2018 Admitting Physician Starleen Arms, MD PCP:Uba, Reuel Boom, MD  Brief Narrative: Patient is a 67 y.o. male with history of schizophrenia/?  Cognitive dysfunction (dementia), hypertension, BPH, remote history (in the 1990s) of trauma causing small bowel injury requiring laparotomy-mostly wheelchair-bound-presented to the hospital with approximately 1 week history of intermittent vomiting, confusion for a few days prior to this hospital admission-presented with sepsis secondary to UTI, vomiting and significant constipation.  Patient was started on IV antibiotics and admitted to the hospital service,initial imaging was suggestive of constipation causing gastric distention-NG tube was placed, however upon further evaluation with a CT scan he was found  to have a small bowel obstruction.  General surgery was consulted, even with n.p.o. status/NG tube decompression-he continues to have SBO-patient's sister has refused to sign consent for surgery, and wants to continue with TNA for a few days so that he can "get stronger" before consenting to surgery.  See below for further details  Subjective: He denies any abdominal pain.  No shortness of breath.  Per nursing staff no major issues overnight.  Sister at bedside-WANTS to continue with TNA-and revisit on Monday regarding surgery. Sister continues to express issues with surgery-voices numerous complaints with the nursing staff etc. Seems to keep reminding the medical team that she is "paralegal lawyer" . I clearly told the patient's sister, the treatment for her brother was still the same even if she was a Clinical research associate.  Patient shakes his head "no" when asked if he has abdominal pain.  This MD rounded with surgical PA-C and bedside RN  Assessment/Plan: Sepsis secondary to complicated UTI: Sepsis  pathophysiology has resolved, urine culture positive for Klebsiella-has completed a course of IV Rocephin.  Blood cultures remain negative.    Aspiration pneumonia: Secondary to SBO/vomiting with underlying frailty.  He still occasionally seems to accumulate oral secretions-but he is clinically improved, his lungs were clear today without any transmitted upper airway sounds.  He has completed a course of Rocephin/Flagyl.  Continue to suction as needed, continue to mobilize out of bed to chair if possible.  If patient is able to participate in incentive spirometry- encourage incentive spirometry.  Sister at bedside-clearly explained by this MD that with ongoing SBO-patient will probably continue to get more debilitated/deconditioned and will always be at risk of recurrent aspiration episodes causing pneumonia, respiratory failure.  SBO: Probably secondary to adhesions/scar tissue related to remote history of small bowel injury from trauma.  In spite of NG tube decompression/n.p.o. status-continues to have persistent SBO.  Clearly needs surgery-however patient's sister wants the patient to continue with TNA over the weekend before she will consent to surgery.  She wants patient to "get stronger".  This morning, this MD clearly explained to the patient's sister-that debility/deconditioning/aspiration pneumonia was due to underlying SBO-and as long as the patient had SBO-the patient would remain at risk of further worsening.  General surgery has been recommending surgery for the past few days.  Very difficult situation-patient's sister initially wanted "guarantees"-and did not seem to understand inherent risks of surgery/general anesthesia-and inherent risks of operating on a frail 67 year old patient that is mostly wheelchair-bound patient with cognitive dysfunction issues.  After explaining numerous risks of few days back-she wanted a  second opinion-and wanted the patient to be transferred to [facility]-after speaking  with hospitalist/surgeon at [facility]-patient was refused transfer (see notes from 10/9).  This morning-this MD and the general surgery PA-C-clearly explained the risk of delaying surgery-risk of recurrent aspiration pneumonia, worsening debility/deconditioning etc were all clearly explained.  This MD over the past few days has already clearly explained to the patient's sister that the patient may not survive this hospitalization without surgery (unless SBO opens up spontaneously).Patient's sister seems to be adamant in continuing TNA for the next few days so that he can get "stronger" before consenting to surgery.In the meantime-we will continue with n.p.o. status, NG tube decompression and other supportive care.  Acute metabolic encephalopathy: Patient was much more lethargic and confused on his usual baseline on initial presentation-with supportive care-encephalopathy has resolved.  I suspect he is back to his usual baseline.    Anemia: Suspect anemia secondary to acute illness, and IV fluid dilution.  Has been transfused 1 unit of PRBC so far.  CBC 7.9 this morning-we will recheck tomorrow and decide if patient needs further transfusion.  No indication of any overt blood loss at this point. .  Hyperkalemia: Occurred on 10/10-resolved.   Schizophrenia: Stable-continue IV Depakote and ODT olanzapine.  Is n.p.o with active NG tube decompression-unable to restart Cogentin (per pharmacy no other route that this can be given). Note-per pharmacy-hospitalist service is unable to use parenteral olanzapine-this is limited psychiatry and critical care.   Acute kidney injury: Hemodynamically mediated-resolved.   Hypernatremia: Resolved with hypotonic saline.  This was secondary to vomiting and dehydration.  Now on TNA.  Hypertension: Stable-continue to hold antihypertensives.    Severe malnutrition: Once diet has been started-we will start oral supplements-has been started on TNA  Deconditioning/debility:  Suspect has significant amount of debility at baseline-he is very cachectic-deconditioning/debility has worsened due to acute illness/SBO.  PT evaluation appreciated.  Attempt to mobilize secretions, attempt to mobilize out of bed to chair.    DVT Prophylaxis: Prophylactic Heparin   Code Status: Full code   Family Communication: Sister at bedside  Disposition Plan: Remain inpatient-not stable for discharge  Antimicrobial agents: Anti-infectives (From admission, onward)   Start     Dose/Rate Route Frequency Ordered Stop   05/01/18 1215  metroNIDAZOLE (FLAGYL) IVPB 500 mg  Status:  Discontinued     500 mg 100 mL/hr over 60 Minutes Intravenous Every 8 hours 05/01/18 1201 05/06/18 1153   04/30/18 1100  cefTRIAXone (ROCEPHIN) 2 g in sodium chloride 0.9 % 100 mL IVPB  Status:  Discontinued     2 g 200 mL/hr over 30 Minutes Intravenous Every 24 hours 04/29/18 1416 05/06/18 1153   04/29/18 1430  cefTRIAXone (ROCEPHIN) 1 g in sodium chloride 0.9 % 100 mL IVPB     1 g 200 mL/hr over 30 Minutes Intravenous Every 24 hours 04/29/18 1416 04/30/18 1429   04/29/18 1130  cefTRIAXone (ROCEPHIN) 1 g in sodium chloride 0.9 % 100 mL IVPB     1 g 200 mL/hr over 30 Minutes Intravenous  Once 04/29/18 1118 04/29/18 1251      Procedures: None  CONSULTS:  None  Time spent: 25 minutes minutes-Greater than 50% of this time was spent in counseling, explanation of diagnosis, planning of further management, and coordination of care.  MEDICATIONS: Scheduled Meds: . benztropine  1 mg Oral BID  . heparin  5,000 Units Subcutaneous Q8H  . insulin aspart  0-9 Units Subcutaneous Q4H  . OLANZapine zydis  10 mg  Oral BID   Continuous Infusions: . sodium chloride 500 mL (05/05/18 1429)  . TPN ADULT (ION) 65 mL/hr at 05/06/18 1735  . TPN ADULT (ION)    . valproate sodium 250 mg (05/07/18 0946)   PRN Meds:.sodium chloride, albuterol, ondansetron (ZOFRAN) IV, sodium chloride flush   PHYSICAL EXAM: Vital  signs: Vitals:   05/06/18 0441 05/06/18 1524 05/06/18 2049 05/07/18 0431  BP: 99/66 101/72 119/81 104/75  Pulse: 86 80 80 98  Resp: 17  18 17   Temp: 98.2 F (36.8 C) 98.2 F (36.8 C) 98.8 F (37.1 C) 98.7 F (37.1 C)  TempSrc:  Oral Oral Oral  SpO2: 94% 99% 97% 98%  Weight:      Height:       Filed Weights   04/29/18 0842  Weight: 49.9 kg   Body mass index is 17.75 kg/m.   General appearance:Awake, alert, smiles this morning-acknowledges me-answers yes/no.  Follows commands. Eyes:no scleral icterus. HEENT: Atraumatic and Normocephalic Neck: supple, no JVD. Resp:Good air entry bilaterally,no rales or rhonchi CVS: S1 S2 regular, no murmurs.  GI: Bowel sounds sluggish t, Non tender and not distended with no gaurding, rigidity or rebound. Extremities: B/L Lower Ext shows no edema, both legs are warm to touch Neurology:  Non focal but with generalized weakness. Psychiatric: Normal judgment and insight. Normal mood. Musculoskeletal:No digital cyanosis Skin:No Rash, warm and dry Wounds:N/A  I have personally reviewed following labs and imaging studies  LABORATORY DATA: CBC: Recent Labs  Lab 05/03/18 0235 05/04/18 0730 05/05/18 1012 05/06/18 1252 05/07/18 0430  WBC 14.8* 18.4* 15.8* 14.2* 13.9*  NEUTROABS  --   --  13.8*  --   --   HGB 9.3* 8.5* 7.4* 8.4* 7.9*  HCT 28.9* 27.0* 24.6* 25.4* 26.0*  MCV 93.2 93.4 95.7 94.1 94.5  PLT 350 374 438* 516* 605*    Basic Metabolic Panel: Recent Labs  Lab 05/03/18 0235 05/04/18 0730 05/04/18 0826 05/05/18 1012 05/05/18 1552 05/06/18 0355 05/07/18 0430  NA 143 141  --  138 140 142 140  K 2.9* 3.3*  --  6.4* 3.9 3.4* 3.6  CL 102 101  --  105 104 104 105  CO2 29 30  --  27 27 29 27   GLUCOSE 82 81  --  98 49* 106* 103*  BUN 18 20  --  13 13 10 16   CREATININE 0.98 0.97  --  0.74 0.76 0.73 0.69  CALCIUM 8.4* 8.4*  --  8.1* 8.4* 8.3* 8.6*  MG 2.0  --  1.9 1.6*  --  2.3 2.1  PHOS  --   --   --  1.4*  --  2.9 2.5     GFR: Estimated Creatinine Clearance: 64.1 mL/min (by C-G formula based on SCr of 0.69 mg/dL).  Liver Function Tests: Recent Labs  Lab 05/05/18 1012  AST 8*  ALT 6  ALKPHOS 46  BILITOT 0.5  PROT 5.0*  ALBUMIN 2.2*   No results for input(s): LIPASE, AMYLASE in the last 168 hours. No results for input(s): AMMONIA in the last 168 hours.  Coagulation Profile: No results for input(s): INR, PROTIME in the last 168 hours.  Cardiac Enzymes: No results for input(s): CKTOTAL, CKMB, CKMBINDEX, TROPONINI in the last 168 hours.  BNP (last 3 results) No results for input(s): PROBNP in the last 8760 hours.  HbA1C: No results for input(s): HGBA1C in the last 72 hours.  CBG: Recent Labs  Lab 05/06/18 1730 05/06/18 2051 05/07/18 0007 05/07/18 1610  05/07/18 0752  GLUCAP 109* 122* 95 116* 114*    Lipid Profile: Recent Labs    05/05/18 1012  TRIG 91    Thyroid Function Tests: No results for input(s): TSH, T4TOTAL, FREET4, T3FREE, THYROIDAB in the last 72 hours.  Anemia Panel: No results for input(s): VITAMINB12, FOLATE, FERRITIN, TIBC, IRON, RETICCTPCT in the last 72 hours.  Urine analysis:    Component Value Date/Time   COLORURINE AMBER (A) 04/29/2018 1059   APPEARANCEUR CLOUDY (A) 04/29/2018 1059   LABSPEC 1.020 04/29/2018 1059   PHURINE 6.5 04/29/2018 1059   GLUCOSEU NEGATIVE 04/29/2018 1059   HGBUR LARGE (A) 04/29/2018 1059   BILIRUBINUR MODERATE (A) 04/29/2018 1059   KETONESUR 15 (A) 04/29/2018 1059   PROTEINUR 30 (A) 04/29/2018 1059   UROBILINOGEN 1.0 06/08/2010 1523   NITRITE POSITIVE (A) 04/29/2018 1059   LEUKOCYTESUR MODERATE (A) 04/29/2018 1059    Sepsis Labs: Lactic Acid, Venous    Component Value Date/Time   LATICACIDVEN 1.07 04/29/2018 1334    MICROBIOLOGY: Recent Results (from the past 240 hour(s))  Culture, Urine     Status: Abnormal   Collection Time: 04/29/18 11:57 AM  Result Value Ref Range Status   Specimen Description URINE, RANDOM   Final   Special Requests   Final    NONE Performed at [facility], [address], [place], [place] [address]    Culture >=100,000 COLONIES/mL KLEBSIELLA PNEUMONIAE (A)  Final   Report Status 05/01/2018 FINAL  Final   Organism ID, Bacteria KLEBSIELLA PNEUMONIAE (A)  Final      Susceptibility   Klebsiella pneumoniae - MIC*    AMPICILLIN >=32 RESISTANT Resistant     CEFAZOLIN <=4 SENSITIVE Sensitive     CEFTRIAXONE <=1 SENSITIVE Sensitive     CIPROFLOXACIN <=0.25 SENSITIVE Sensitive     GENTAMICIN <=1 SENSITIVE Sensitive     IMIPENEM 0.5 SENSITIVE Sensitive     NITROFURANTOIN 64 INTERMEDIATE Intermediate     TRIMETH/SULFA <=20 SENSITIVE Sensitive     AMPICILLIN/SULBACTAM 16 INTERMEDIATE Intermediate     PIP/TAZO 16 SENSITIVE Sensitive     Extended ESBL NEGATIVE Sensitive     * >=100,000 COLONIES/mL KLEBSIELLA PNEUMONIAE  Culture, blood (routine x 2)     Status: None   Collection Time: 04/29/18  1:25 PM  Result Value Ref Range Status   Specimen Description BLOOD RIGHT ANTECUBITAL  Final   Special Requests   Final    BOTTLES DRAWN AEROBIC AND ANAEROBIC Blood Culture results may not be optimal due to an excessive volume of blood received in culture bottles   Culture   Final    NO GROWTH 5 DAYS Performed at [facility], [address] [place], [place] [address]    Report Status 05/04/2018 FINAL  Final  Culture, blood (routine x 2)     Status: None   Collection Time: 04/30/18  6:18 AM  Result Value Ref Range Status   Specimen Description BLOOD RIGHT ANTECUBITAL  Final   Special Requests   Final    BOTTLES DRAWN AEROBIC ONLY Blood Culture adequate volume   Culture   Final    NO GROWTH 5 DAYS Performed at [facility], [address], [place], [place] [address]    Report Status 05/05/2018 FINAL  Final    RADIOLOGY STUDIES/RESULTS: Ct Abdomen Pelvis Wo Contrast  Result Date: 04/30/2018 CLINICAL DATA:  Nausea, bilious vomiting EXAM: CT ABDOMEN AND PELVIS  WITHOUT CONTRAST TECHNIQUE: Multidetector CT imaging of the abdomen  and pelvis was performed following the standard protocol without IV contrast. Sagittal and coronal MPR images reconstructed from axial data set. Oral contrast was not administered. COMPARISON:  None FINDINGS: Lower chest: Emphysematous changes with BILATERAL lower lobe infiltrates consistent with either pneumonia or aspiration. Hepatobiliary: Liver unremarkable. Gallbladder not well visualized due to streak artifacts, grossly unremarkable. Pancreas: Normal appearance Spleen: Normal appearance Adrenals/Urinary Tract: Question adrenal thickening bilaterally. No obvious renal mass or hydronephrosis. Ureters not visualized. Foley catheter decompresses urinary bladder. Stomach/Bowel: Nasogastric tube in stomach. Low-attenuation fluid distends stomach and multiple proximal small bowel loops, with small bowel loops up to 4.1 cm diameter. Distal small bowel loops and colon are decompressed. Scattered stool throughout colon. Findings likely represent mid small bowel obstruction. Appendix not visualized. Suspected rectal wall thickening. Vascular/Lymphatic: Atherosclerotic calcification aorta. Aorta normal caliber. Reproductive: Minimal prostatic enlargement and scattered prostatic calcifications. Other: No definite free air or free fluid. Nonspecific stranding of presacral fat. Musculoskeletal: No acute osseous findings. IMPRESSION: Dilated proximal and decompressed distal small bowel loops compatible with small bowel obstruction, likely in the mid small bowel. Due to lack of fat planes, streak artifacts, and lack of contrast, unable to localize site of obstruction. No evidence of perforation/free air. Question rectal wall thickening, recommend correlation with proctoscopy. Emphysematous changes with bibasilar pulmonary infiltrates either representing pneumonia or aspiration. Electronically Signed   By: Ulyses Southward M.D.   On: 04/30/2018 19:38   Dg Chest 2  View  Result Date: 04/12/2018 CLINICAL DATA:  Fever of unknown origin. EXAM: CHEST - 2 VIEW COMPARISON:  None. FINDINGS: The heart size is within normal limits. Ectasia of the thoracic aorta is noted. Pulmonary hyperinflation is seen, consistent with COPD. Mild scarring noted at right lung base as well as several old right rib fracture deformities. No evidence of pulmonary infiltrate, edema, or pleural effusion. IMPRESSION: COPD.  No active cardiopulmonary disease. Electronically Signed   By: Myles Rosenthal M.D.   On: 04/12/2018 19:16   Dg Abd 1 View  Result Date: 05/03/2018 CLINICAL DATA:  Small bowel obstruction EXAM: ABDOMEN - 1 VIEW COMPARISON:  05/02/2018 FINDINGS: NG tube is in the stomach. Gaseous distention of the stomach and left abdominal small bowel loops. Gas and stool within nondistended colon. No free air or organomegaly. IMPRESSION: Gaseous distention of stomach and left abdominal small bowel loops likely reflecting small bowel obstruction. NG tube is in the stomach. Electronically Signed   By: Charlett Nose M.D.   On: 05/03/2018 10:05   Dg Abd 1 View  Result Date: 05/02/2018 CLINICAL DATA:  Small bowel obstruction EXAM: ABDOMEN - 1 VIEW COMPARISON:  May 01, 2018 abdominal radiograph and CT abdomen and pelvis April 30, 2018 FINDINGS: Nasogastric tube no longer present. There is slightly less bowel dilatation in the left upper quadrant compared to 1 day prior. There is moderate air in the stomach. No bowel dilatation elsewhere. No air-fluid levels. No free air. There is moderate stool in the colon. There is atelectatic change in the right lung base. IMPRESSION: Nasogastric tube no longer appreciable. Less small bowel dilatation in the left upper quadrant. Question resolving bowel obstruction. No free air evident. Electronically Signed   By: Bretta Bang III M.D.   On: 05/02/2018 14:20   Ct Head Wo Contrast  Result Date: 04/29/2018 CLINICAL DATA:  Altered level of consciousness  increased over last 2 days, 2 episodes of nausea and vomiting, history of prior brain injury, dementia, former smoker, hypertension EXAM: CT HEAD WITHOUT CONTRAST TECHNIQUE: Contiguous  axial images were obtained from the base of the skull through the vertex without intravenous contrast. COMPARISON:  06/08/2010 FINDINGS: Brain: Generalized atrophy. Normal ventricular morphology. No midline shift or mass effect. Small vessel chronic ischemic changes of deep cerebral white matter. 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 No intracranial hemorrhage, mass lesion, evidence of acute infarction, or extra-axial fluid collection. Vascular: Minimal atherosclerotic calcification of internal carotid arteries at skull base Skull: Intact Sinuses/Orbits: Clear Other: N/A IMPRESSION: Atrophy with small vessel chronic ischemic changes of deep cerebral white matter. 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 No acute intracranial abnormalities. Electronically Signed   By: Ulyses Southward M.D.   On: 04/29/2018 09:58   Dg Chest Port 1 View  Result Date: 04/29/2018 CLINICAL DATA:  Altered mental status EXAM: PORTABLE CHEST 1 VIEW COMPARISON:  April 12, 2018 FINDINGS: There is mild scarring in the lateral right base and in the medial right lower lung region. There is no edema or consolidation. The heart size and pulmonary vascularity are normal. No adenopathy. There is a recent appearing fracture of the posterior right seventh rib with alignment essentially anatomic. An older fracture of the posterior right eighth rib is noted. No pneumothorax. IMPRESSION: Recent appearing fracture posterior right seventh rib. Older fracture posterior right eighth rib noted. Areas of mild scarring noted on the right. No edema or consolidation. Stable cardiac silhouette. Electronically Signed   By: Bretta Bang III M.D.   On: 04/29/2018 08:46   Dg Chest Port 1v Same Day  Result Date: 05/01/2018 CLINICAL DATA:  history of  schizophrenia, hypertension, BPH-mostly wheelchair-bound-presented to the hospital with approximately 1 week history of intermittent vomiting, confusion for a few days prior to this hospital admission-presented with sepsis secondary to UTI, and significant constipation. C/o SOB EXAM: PORTABLE CHEST - 1 VIEW SAME DAY COMPARISON:  04/29/2018 FINDINGS: Lungs are hyperinflated. Subsegmental atelectasis or early infiltrate at the left lung base, new since previous. Right lung clear. Heart size normal.  Tortuous ectatic thoracic aorta. No effusion. No pneumothorax. Right anterolateral fourth rib fracture, age indeterminate. Nasogastric tube extends to the stomach. IMPRESSION: 1. New patchy infiltrate or subsegmental atelectasis at the left lung base. 2. Right fourth rib fracture without pneumothorax. 3. Nasogastric tube placement to the stomach. Electronically Signed   By: Corlis Leak M.D.   On: 05/01/2018 08:24   Dg Abd 2 Views  Result Date: 05/05/2018 CLINICAL DATA:  Vomiting.  History of right lumpectomy. EXAM: ABDOMEN - 2 VIEW COMPARISON:  05/04/2018 FINDINGS: Improve dilation of small bowel loops with residual borderline gas-filled distended small bowel loops in the left upper quadrant of the abdomen. Normal colonic bowel gas pattern. No evidence of free intra-abdominal gas. Normal cardiac silhouette. Tortuosity of the thoracic aorta. Right pleural reflection versus small pleural effusion. No lobar airspace consolidation. Enteric catheter tip within the expected location the gastric body, side hole at the GE junction level. Right PICC line terminates in the expected location of the cavoatrial junction. IMPRESSION: Improved appearance of small-bowel obstruction. Enteric catheter with side hole at the level of the GE junction. Advancement may be considered. Possible small right pleural effusion. Electronically Signed   By: Ted Mcalpine M.D.   On: 05/05/2018 12:51   Dg Abd Acute W/chest  Result Date:  05/04/2018 CLINICAL DATA:  Follow-up small bowel obstruction. EXAM: DG ABDOMEN ACUTE W/ 1V CHEST COMPARISON:  Abdominal x-rays 05/04/1999 19 dating back to 04/29/2018. CT abdomen and pelvis 04/30/2018. Chest x-rays 05/01/2018, 04/12/2018. FINDINGS: Nasogastric  tube tip in the fundus of the stomach. Persistent marked gaseous distension of the jejunum in the LEFT UPPER QUADRANT, slowly progressive over the past 3 days, with air-fluid levels on the LATERAL decubitus image. No evidence of free intraperitoneal air. Normal caliber colon with moderate stool burden. Calcified uterine fibroid in the pelvic midline. Cardiac silhouette normal in size, unchanged. Thoracic aorta tortuous and atherosclerotic. Hilar and mediastinal contours otherwise unremarkable. Interval improvement in aeration in the LEFT lung base, with only minimal patchy opacities persisting. Lungs otherwise clear. Emphysematous changes in both lungs with scarring in the RIGHT mid lung, unchanged. Pleuroparenchymal scarring at the RIGHT lung base which accounts for the blunting of the costophrenic angle, unchanged dating back to the original 04/12/2018 examination. IMPRESSION: 1. Persistent partial small bowel obstruction which has slowly worsened over the past 3 days. 2. No evidence of free intraperitoneal air. 3. Improved aeration in the LEFT LOWER LOBE, with only mild atelectasis and/or pneumonia persisting. Electronically Signed   By: Hulan Saas M.D.   On: 05/04/2018 09:28   Dg Abd Portable 1v  Result Date: 05/06/2018 CLINICAL DATA:  Small bowel obstruction EXAM: PORTABLE ABDOMEN - 1 VIEW COMPARISON:  Portable exam at 0628 hours compared to 05/05/2018 FINDINGS: Stool scattered throughout colon. Persistent dilatation of small bowel loops in the LEFT mid abdomen up to 4.9 cm diameter. No bowel wall thickening. Bones demineralized. No definite renal calcifications. IMPRESSION: Persistent small bowel dilatation consistent with small bowel  obstruction. Little interval change. Electronically Signed   By: Ulyses Southward M.D.   On: 05/06/2018 10:21   Dg Abd Portable 1v-small Bowel Obstruction Protocol-initial, 8 Hr Delay  Result Date: 05/04/2018 CLINICAL DATA:  Small bowel obstruction protocol. 8 hour delay. EXAM: PORTABLE ABDOMEN - 1 VIEW COMPARISON:  05/03/2018 FINDINGS: Gaseous distention of left upper quadrant small bowel. Enteric tube with tip projected over the mid abdomen consistent with location in the upper stomach. Contrast material is demonstrated in the dilated small bowel. No contrast material is identified in the colon. This is suggestive of high-grade obstruction. Degenerative changes in the spine. IMPRESSION: Contrast material is demonstrated in the dilated small bowel but not in the colon consistent with high-grade obstruction. Electronically Signed   By: Burman Nieves M.D.   On: 05/04/2018 01:55   Dg Abd Portable 1v-small Bowel Obstruction Protocol-initial, 8 Hr Delay  Result Date: 05/01/2018 CLINICAL DATA:  Small bowel obstruction. EXAM: PORTABLE ABDOMEN - 1 VIEW COMPARISON:  Radiograph earlier this day at 1109 hour, CT yesterday. FINDINGS: Enteric tube in place with tip in the stomach, side-port just beyond the gastroesophageal junction. Improving small bowel dilatation in the left abdomen. Moderate stool in the right and transverse colon. No evidence of free air. Pelvic calcification may be stone in the urinary bladder or exophytic prostate calcification. IMPRESSION: Improving small bowel dilatation in the left abdomen since radiograph earlier this day. Electronically Signed   By: Narda Rutherford M.D.   On: 05/01/2018 21:14   Dg Abd Portable 1v-small Bowel Protocol-position Verification  Result Date: 05/01/2018 CLINICAL DATA:  Nasogastric tube placement EXAM: PORTABLE ABDOMEN - 1 VIEW COMPARISON:  Portable exam 1109 hours compared to 04/30/2018 FINDINGS: Tip of nasogastric tube projects over mid stomach. Dilated small  bowel loops are seen in the LEFT mid abdomen. Increased stool in RIGHT colon. Lung bases appear emphysematous with subsegmental atelectasis at LEFT base. Bones demineralized. IMPRESSION: Tip of nasogastric tube projects over mid stomach. Small bowel dilatation in LEFT mid abdomen with increased stool in  RIGHT colon. Electronically Signed   By: Ulyses Southward M.D.   On: 05/01/2018 11:22   Dg Abd Portable 1v  Result Date: 04/30/2018 CLINICAL DATA:  67 year old male with vomiting. EXAM: PORTABLE ABDOMEN - 1 VIEW COMPARISON:  04/29/2018 abdominal radiographs. FINDINGS: Portable AP semi upright and supine views at 0847 hours. Enteric tube has been placed and the stomach now appears decompressed. NG tube side hole at the gastric body. Non obstructed bowel gas pattern. Moderate volume of retained stool redemonstrated in the transverse colon and distal sigmoid/rectum. Negative visible lung bases. No pneumoperitoneum. Stable abdominal and pelvic visceral contours. Dystrophic calcification in the pelvis. Stable pelvic catheter, likely a Foley. No acute osseous abnormality identified. IMPRESSION: 1. NG tube in place with resolved gastric distention. 2. Non obstructed bowel gas pattern with moderate volume of retained stool. Electronically Signed   By: Odessa Fleming M.D.   On: 04/30/2018 09:06   Dg Abd Portable 1v  Result Date: 04/29/2018 CLINICAL DATA:  Nausea and vomiting EXAM: PORTABLE ABDOMEN - 1 VIEW COMPARISON:  None. FINDINGS: Gaseous distention of the stomach. Formed stool throughout the colon. No concerning mass effect or calcification. IMPRESSION: Prominent gaseous distension of the stomach. Constipated appearance. Electronically Signed   By: Marnee Spring M.D.   On: 04/29/2018 14:44   [place] Ekg Site Rite  Result Date: 05/04/2018 If Site Rite image not attached, placement could not be confirmed due to current cardiac rhythm.    LOS: 8 days   Jeoffrey Massed, MD  [facility]  If 7PM-7AM, please contact  night-coverage  Please page via [email or web address]-click on MD name and type text message  05/07/2018, 9:47 AM

## 2018-05-08 ENCOUNTER — Inpatient Hospital Stay (HOSPITAL_COMMUNITY): Payer: Medicare Other

## 2018-05-08 DIAGNOSIS — R0602 Shortness of breath: Secondary | ICD-10-CM

## 2018-05-08 LAB — FOLATE: FOLATE: 8.6 ng/mL (ref >5.9)

## 2018-05-08 LAB — CBC
HEMATOCRIT: 23.2 % — AB (ref 39.0–52.0)
HEMOGLOBIN: 7.8 g/dL — AB (ref 13.0–17.0)
MCH: 33.9 pg (ref 26.0–34.0)
MCHC: 33.6 g/dL (ref 30.0–36.0)
MCV: 100.9 fL — ABNORMAL HIGH (ref 80.0–100.0)
NRBC: 0 % (ref 0.0–0.2)
Platelets: 588 10*3/uL — ABNORMAL HIGH (ref 150–400)
RBC: 2.3 MIL/uL — AB (ref 4.22–5.81)
RDW: 16.2 % — ABNORMAL HIGH (ref 11.5–15.5)
WBC: 15.1 10*3/uL — AB (ref 4.0–10.5)

## 2018-05-08 LAB — BASIC METABOLIC PANEL
ANION GAP: 4 — AB (ref 5–15)
BUN: 24 mg/dL — ABNORMAL HIGH (ref 8–23)
CHLORIDE: 110 mmol/L (ref 98–111)
CO2: 25 mmol/L (ref 22–32)
CREATININE: 0.63 mg/dL (ref 0.61–1.24)
Calcium: 8.3 mg/dL — ABNORMAL LOW (ref 8.9–10.3)
GFR calc non Af Amer: >60 mL/min (ref >60)
Glucose, Bld: 98 mg/dL (ref 70–99)
POTASSIUM: 4.1 mmol/L (ref 3.5–5.1)
SODIUM: 139 mmol/L (ref 135–145)

## 2018-05-08 LAB — RETICULOCYTES
Immature Retic Fract: 36.1 % — ABNORMAL HIGH (ref 2.3–15.9)
RBC.: 2.3 MIL/uL — ABNORMAL LOW (ref 4.22–5.81)
RETIC COUNT ABSOLUTE: 13.8 10*3/uL — AB (ref 19.0–186.0)
Retic Ct Pct: 0.6 % (ref 0.4–3.1)

## 2018-05-08 LAB — GLUCOSE, CAPILLARY
GLUCOSE-CAPILLARY: 108 mg/dL — AB (ref 70–99)
GLUCOSE-CAPILLARY: 91 mg/dL (ref 70–99)
GLUCOSE-CAPILLARY: 102 mg/dL — AB (ref 70–99)
Glucose-Capillary: 100 mg/dL — ABNORMAL HIGH (ref 70–99)
Glucose-Capillary: 93 mg/dL (ref 70–99)

## 2018-05-08 LAB — IRON AND TIBC
IRON: 11 ug/dL — AB (ref 45–182)
SATURATION RATIOS: 7 % — AB (ref 17.9–39.5)
TIBC: 157 ug/dL — AB (ref 250–450)
UIBC: 146 ug/dL

## 2018-05-08 LAB — FERRITIN: FERRITIN: 58 ng/mL (ref 24–336)

## 2018-05-08 LAB — VITAMIN B12: VITAMIN B 12: 740 pg/mL (ref 180–914)

## 2018-05-08 MED ORDER — POLYVINYL ALCOHOL 1.4 % OP SOLN
1.0000 [drp] | Freq: Three times a day (TID) | OPHTHALMIC | Status: DC
  Administered 2018-05-08 – 2018-06-03 (×66): 1 [drp] via OPHTHALMIC
  Filled 2018-05-08 (×4): qty 15

## 2018-05-08 MED ORDER — ACETAMINOPHEN 650 MG RE SUPP
650.0000 mg | Freq: Four times a day (QID) | RECTAL | Status: DC | PRN
  Administered 2018-05-08 (×2): 650 mg via RECTAL
  Filled 2018-05-08 (×3): qty 1

## 2018-05-08 MED ORDER — TRAVASOL 10 % IV SOLN
INTRAVENOUS | Status: AC
  Administered 2018-05-08: 18:00:00 via INTRAVENOUS
  Filled 2018-05-08: qty 936

## 2018-05-08 MED ORDER — ACETAMINOPHEN 650 MG RE SUPP: 650.0000 mg | Freq: Four times a day (QID) | RECTAL | Status: DC | PRN

## 2018-05-08 MED ORDER — SODIUM CHLORIDE 0.9 % IV BOLUS
500.0000 mL | Freq: Once | INTRAVENOUS | Status: AC
  Administered 2018-05-08: 500 mL via INTRAVENOUS

## 2018-05-08 MED ORDER — METOPROLOL TARTRATE 5 MG/5ML IV SOLN: 5.0000 mg | INTRAVENOUS | Status: DC | PRN

## 2018-05-08 NOTE — Progress Notes (Signed)
Temp is 98.3 after the Tylenol suppository and the NS bolus, Will continue to monitor.

## 2018-05-08 NOTE — Progress Notes (Signed)
Paged MD at this time regarding Pt's sister request to speak with MD. MD speaking with Pt's sister. Will continue to monitor Pt.

## 2018-05-08 NOTE — Progress Notes (Signed)
New order for Tylenol 650 mg PR as needed for fever received from [facility]. Order implemented as received. Will recheck temp in an hour and continue to monitor.

## 2018-05-08 NOTE — Progress Notes (Signed)
PHARMACY - ADULT TOTAL PARENTERAL NUTRITION CONSULT NOTE   Pharmacy Consult for TPN Indication: Bowel obstruction  Patient Measurements: Height: 5\' 6"  (167.6 cm) Weight: 110 lb (49.9 kg) IBW/kg (Calculated) : 63.8 TPN AdjBW (KG): 49.9 Body mass index is 17.75 kg/m.  Assessment:  30 YOM admitted on 10/4 with sepsis. He was found to have pneumonia and a possible SBO on CT on 10/5. Pt has a history of SBO s/p colectomy in 1993. An NG tube was placed with improving small bowel dilatation but patient had 3-4 episodes of vomiting overnight on 10/7-10/8 and the NG tube had to be replaced due to incorrect placement. Pharmacy now consulted to start TPN due to worsening SBO and concern for adhesions that will require surgery.   Of note, TPN was prepared on 10/9 but not administered since patient refused a PICC line.   GI: KUB on 10/9 shows persistent SBO which has worsened. Surgery recommended however sister is not agreeable currently. NG output down to . Of note, patient is extremely cachectic. Actual body is significantly below ideal body weight. Albumin 3.5 >> 2.2 Endo: No h/o DM. CBGs <150, had low prior to TPN.  Insulin requirements in the past 24 hours: 0 units Lytes: K mostly stable at 3.6. Phos borderline low, other lytes wnl. Renal: SCr stable, UOP good, net ~ -1 L Pulm: on RA  Cards: VSS Hepatobil: LFTs wnl, Lipase elevated 10/4, TG wnl, prealbumin 11.1 Neuro: benztropine, Zyprexa ODT, IV valproate ID: S/p abx for klebsiella UTI and aspiration PNA. Afebrile, WBC down to 13.9.  TPN Access: double lumen PICC placed 10/10 TPN start date: 05/04/18 >>  Nutritional Goals (per RD recommendation on 10/9): Kcal:  1550-1750 Protein:  85-100 grams Fluid:  >1.5 L  Goal TPN rate is 65 ml/hr (provides 1560 mL / 24 hours)  Current Nutrition:  NPO TPN  Plan:  Continue TPN at 65 mL/hr. This TPN provides 94 g of protein, 218 g of dextrose, and 55 g of lipids which provides 1,663  kCals per day, meeting 100% of patient needs Electrolytes in TPN: 1:1 Cl:Ac, 35 mEq/L Na, increase to 50 mEq/L of K, 5 mEq/L Ca, 8 mEq/L Mg, increase to 20 mmol/L Phos Add MVI, trace elements to TPN Continue sensitive SSI and adjust as needed  Monitor TPN labs F/U probable surgery next week  Family considering transfer to another institution  Enzo Bi, PharmD, [facility] Clinical Pharmacist Phone number [phone] 05/08/2018 7:24 AM

## 2018-05-08 NOTE — Progress Notes (Addendum)
PROGRESS NOTE        PATIENT DETAILS Name: Jeffrey Davila Age: 67 y.o. Sex: male Date of Birth: December 27, 1950 Admit Date: 04/29/2018 Admitting Physician Starleen Arms, MD PCP:Uba, Reuel Boom, MD  Brief Narrative: Patient is a 67 y.o. male with history of schizophrenia/?  Cognitive dysfunction (dementia), hypertension, BPH, remote history (in the 1990s) of trauma causing small bowel injury requiring laparotomy-mostly wheelchair-bound-presented to the hospital with approximately 1 week history of intermittent vomiting, confusion for a few days prior to this hospital admission-presented with sepsis secondary to UTI, vomiting and significant constipation.  Patient was started on IV antibiotics and admitted to the hospital service,initial imaging was suggestive of constipation causing gastric distention-NG tube was placed, however upon further evaluation with a CT scan he was found  to have a small bowel obstruction.  General surgery was consulted, even with n.p.o. status/NG tube decompression-he continues to have SBO-patient's sister has refused to sign consent for surgery, and wants to continue with TNA for a few days so that he can "get stronger" before consenting to surgery.  See below for further details  Subjective:  Patient in bed, appears comfortable, NG tube in place, answers questions in yes or no.  Denies any headache or chest pain.  No abdominal pain.  Assessment/Plan:  Sepsis secondary to complicated UTI: Sepsis pathophysiology has resolved, urine culture positive for Klebsiella-has completed a course of IV Rocephin.  Blood cultures remain negative.    Aspiration pneumonia: Secondary to SBO/vomiting with underlying frailty.  He still occasionally seems to accumulate oral secretions-but he is clinically improved, his lungs were clear today without any transmitted upper airway sounds.  He has completed a course of Rocephin/Flagyl.  Continue to suction as needed,  continue to mobilize out of bed to chair if possible.  If patient is able to participate in incentive spirometry- encourage incentive spirometry.  Clinically stable pneumonia standpoint continue to monitor.  SBO: Probably secondary to adhesions/scar tissue related to remote history of small bowel injury from trauma.  In spite of NG tube decompression/n.p.o. status-continues to have persistent SBO.  Will need surgery however patient's sister not deciding on surgery yet and holding off, clearly told the risk of bowel perforation and ensuing complications including sepsis and death.  She also requested [facility] and [facility] team declined it, again discussed with patient's sister over the phone on 05/08/2018 she is still indecisive, I again explained the risks and benefits.  General surgery following.  For now on IV TNA, sister now states that she might agree for surgery either Monday or Tuesday.  Kindly see previous notes by Dr. Jerral Ralph with a detailed documentation.  Acute metabolic encephalopathy: Patient was much more lethargic and confused on his usual baseline on initial presentation-with supportive care-encephalopathy has resolved.  I suspect he is back to his usual baseline.    Anemia: Combination of anemia of chronic disease, frequent blood draws, iron panel inconclusive, received 1 unit of packed RBC so far this admission, continue to monitor H&H, type screen done.   Hyperkalemia: Occurred on 10/10-resolved.   Schizophrenia: Stable-continue IV Depakote and ODT olanzapine.  Is n.p.o with active NG tube decompression-unable to restart Cogentin (per pharmacy no other route that this can be given). Note-per pharmacy-hospitalist service is unable to use parenteral olanzapine-this is limited psychiatry and critical care.   Acute kidney injury: Hemodynamically mediated-resolved.  Hypernatremia: Resolved with hypotonic saline.  This was secondary to vomiting and dehydration.  Now on  TNA.  Hypertension: Stable-continue to hold antihypertensives.    Severe malnutrition: Once diet has been started-we will start oral supplements-has been started on TNA  Deconditioning/debility: Suspect has significant amount of debility at baseline-he is very cachectic-deconditioning/debility has worsened due to acute illness/SBO.  PT evaluation appreciated.  Attempt to mobilize secretions, attempt to mobilize out of bed to chair.     DVT Prophylaxis: Prophylactic Heparin   Code Status: Full code   Family Communication: Sister over the phone by me on 05/08/2018  Disposition Plan: Remain inpatient-not stable for discharge  Antimicrobial agents: Anti-infectives (From admission, onward)   Start     Dose/Rate Route Frequency Ordered Stop   05/01/18 1215  metroNIDAZOLE (FLAGYL) IVPB 500 mg  Status:  Discontinued     500 mg 100 mL/hr over 60 Minutes Intravenous Every 8 hours 05/01/18 1201 05/06/18 1153   04/30/18 1100  cefTRIAXone (ROCEPHIN) 2 g in sodium chloride 0.9 % 100 mL IVPB  Status:  Discontinued     2 g 200 mL/hr over 30 Minutes Intravenous Every 24 hours 04/29/18 1416 05/06/18 1153   04/29/18 1430  cefTRIAXone (ROCEPHIN) 1 g in sodium chloride 0.9 % 100 mL IVPB     1 g 200 mL/hr over 30 Minutes Intravenous Every 24 hours 04/29/18 1416 04/30/18 1429   04/29/18 1130  cefTRIAXone (ROCEPHIN) 1 g in sodium chloride 0.9 % 100 mL IVPB     1 g 200 mL/hr over 30 Minutes Intravenous  Once 04/29/18 1118 04/29/18 1251      Procedures: None  CONSULTS:  None  Time spent: 25 minutes minutes-Greater than 50% of this time was spent in counseling, explanation of diagnosis, planning of further management, and coordination of care.  MEDICATIONS: Scheduled Meds: . benztropine  1 mg Oral BID  . heparin  5,000 Units Subcutaneous Q8H  . insulin aspart  0-9 Units Subcutaneous Q4H  . OLANZapine zydis  10 mg Oral BID   Continuous Infusions: . sodium chloride 500 mL (05/05/18 1429)   . TPN ADULT (ION) 65 mL/hr at 05/07/18 1752  . TPN ADULT (ION)    . valproate sodium 250 mg (05/08/18 0825)   PRN Meds:.sodium chloride, acetaminophen, albuterol, ondansetron (ZOFRAN) IV, sodium chloride flush   PHYSICAL EXAM: Vital signs: Vitals:   05/07/18 2013 05/07/18 2332 05/08/18 0504 05/08/18 0642  BP: 107/78  96/69   Pulse: (!) 103  99   Resp:   18   Temp: 100.2 F (37.9 C) 99.2 F (37.3 C) (!) 101.1 F (38.4 C) 98.3 F (36.8 C)  TempSrc: Oral Oral    SpO2: 97%  98%   Weight:      Height:       Filed Weights   04/29/18 0842  Weight: 49.9 kg   Body mass index is 17.75 kg/m.    Exam  Awake unable to follow commands reliably but answers in yes or no, appears to be in no distress, NG tube in place [place].AT,PERRAL Supple Neck,No JVD, No cervical lymphadenopathy appriciated.  Symmetrical Chest wall movement, Good air movement bilaterally, CTAB RRR,No Gallops, Rubs or new Murmurs, No Parasternal Heave Hypoactive but +ve B.Sounds, Abd Soft, No tenderness, No organomegaly appriciated, No rebound - guarding or rigidity. No Cyanosis, Clubbing or edema, No new Rash or bruise   I have personally reviewed following labs and imaging studies  LABORATORY DATA:  CBC:  Recent Labs  Lab  05/04/18 0730 05/05/18 1012 05/06/18 1252 05/07/18 0430 05/08/18 0804  WBC 18.4* 15.8* 14.2* 13.9* 15.1*  NEUTROABS  --  13.8*  --   --   --   HGB 8.5* 7.4* 8.4* 7.9* 7.8*  HCT 27.0* 24.6* 25.4* 26.0* 23.2*  MCV 93.4 95.7 94.1 94.5 100.9*  PLT 374 438* 516* 605* 588*    Basic Metabolic Panel: Recent Labs  Lab 05/03/18 0235  05/04/18 0826 05/05/18 1012 05/05/18 1552 05/06/18 0355 05/07/18 0430 05/08/18 1050  NA 143   < >  --  138 140 142 140 139  K 2.9*   < >  --  6.4* 3.9 3.4* 3.6 4.1  CL 102   < >  --  105 104 104 105 110  CO2 29   < >  --  27 27 29 27 25   GLUCOSE 82   < >  --  98 49* 106* 103* 98  BUN 18   < >  --  13 13 10 16  24*  CREATININE 0.98   < >  --  0.74  0.76 0.73 0.69 0.63  CALCIUM 8.4*   < >  --  8.1* 8.4* 8.3* 8.6* 8.3*  MG 2.0  --  1.9 1.6*  --  2.3 2.1  --   PHOS  --   --   --  1.4*  --  2.9 2.5  --    < > = values in this interval not displayed.    GFR: Estimated Creatinine Clearance: 64.1 mL/min (by C-G formula based on SCr of 0.63 mg/dL).  Liver Function Tests: Recent Labs  Lab 05/05/18 1012  AST 8*  ALT 6  ALKPHOS 46  BILITOT 0.5  PROT 5.0*  ALBUMIN 2.2*   No results for input(s): LIPASE, AMYLASE in the last 168 hours. No results for input(s): AMMONIA in the last 168 hours.  Coagulation Profile: No results for input(s): INR, PROTIME in the last 168 hours.  Cardiac Enzymes: No results for input(s): CKTOTAL, CKMB, CKMBINDEX, TROPONINI in the last 168 hours.  BNP (last 3 results) No results for input(s): PROBNP in the last 8760 hours.  HbA1C: No results for input(s): HGBA1C in the last 72 hours.  CBG: Recent Labs  Lab 05/07/18 1705 05/07/18 2014 05/07/18 2338 05/08/18 0502 05/08/18 0816  GLUCAP 95 103* 101* 108* 100*    Lipid Profile: No results for input(s): CHOL, HDL, LDLCALC, TRIG, CHOLHDL, LDLDIRECT in the last 72 hours.  Thyroid Function Tests: No results for input(s): TSH, T4TOTAL, FREET4, T3FREE, THYROIDAB in the last 72 hours.  Anemia Panel: Recent Labs    05/08/18 0804  VITAMINB12 740  FERRITIN 58  TIBC 157*  IRON 11*  RETICCTPCT 0.6    Urine analysis:    Component Value Date/Time   COLORURINE AMBER (A) 04/29/2018 1059   APPEARANCEUR CLOUDY (A) 04/29/2018 1059   LABSPEC 1.020 04/29/2018 1059   PHURINE 6.5 04/29/2018 1059   GLUCOSEU NEGATIVE 04/29/2018 1059   HGBUR LARGE (A) 04/29/2018 1059   BILIRUBINUR MODERATE (A) 04/29/2018 1059   KETONESUR 15 (A) 04/29/2018 1059   PROTEINUR 30 (A) 04/29/2018 1059   UROBILINOGEN 1.0 06/08/2010 1523   NITRITE POSITIVE (A) 04/29/2018 1059   LEUKOCYTESUR MODERATE (A) 04/29/2018 1059    Sepsis Labs: Lactic Acid, Venous    Component  Value Date/Time   LATICACIDVEN 1.07 04/29/2018 1334    MICROBIOLOGY: Recent Results (from the past 240 hour(s))  Culture, Urine     Status: Abnormal   Collection  Time: 04/29/18 11:57 AM  Result Value Ref Range Status   Specimen Description URINE, RANDOM  Final   Special Requests   Final    NONE Performed at [facility], [address] [place], [place] [address]    Culture >=100,000 COLONIES/mL KLEBSIELLA PNEUMONIAE (A)  Final   Report Status 05/01/2018 FINAL  Final   Organism ID, Bacteria KLEBSIELLA PNEUMONIAE (A)  Final      Susceptibility   Klebsiella pneumoniae - MIC*    AMPICILLIN >=32 RESISTANT Resistant     CEFAZOLIN <=4 SENSITIVE Sensitive     CEFTRIAXONE <=1 SENSITIVE Sensitive     CIPROFLOXACIN <=0.25 SENSITIVE Sensitive     GENTAMICIN <=1 SENSITIVE Sensitive     IMIPENEM 0.5 SENSITIVE Sensitive     NITROFURANTOIN 64 INTERMEDIATE Intermediate     TRIMETH/SULFA <=20 SENSITIVE Sensitive     AMPICILLIN/SULBACTAM 16 INTERMEDIATE Intermediate     PIP/TAZO 16 SENSITIVE Sensitive     Extended ESBL NEGATIVE Sensitive     * >=100,000 COLONIES/mL KLEBSIELLA PNEUMONIAE  Culture, blood (routine x 2)     Status: None   Collection Time: 04/29/18  1:25 PM  Result Value Ref Range Status   Specimen Description BLOOD RIGHT ANTECUBITAL  Final   Special Requests   Final    BOTTLES DRAWN AEROBIC AND ANAEROBIC Blood Culture results may not be optimal due to an excessive volume of blood received in culture bottles   Culture   Final    NO GROWTH 5 DAYS Performed at [facility], [address], [place], [place] [address]    Report Status 05/04/2018 FINAL  Final  Culture, blood (routine x 2)     Status: None   Collection Time: 04/30/18  6:18 AM  Result Value Ref Range Status   Specimen Description BLOOD RIGHT ANTECUBITAL  Final   Special Requests   Final    BOTTLES DRAWN AEROBIC ONLY Blood Culture adequate volume   Culture   Final    NO GROWTH 5 DAYS Performed at [facility], [address] [place], [place] [address]    Report Status 05/05/2018 FINAL  Final    RADIOLOGY STUDIES/RESULTS: Ct Abdomen Pelvis Wo Contrast  Result Date: 04/30/2018 CLINICAL DATA:  Nausea, bilious vomiting EXAM: CT ABDOMEN AND PELVIS WITHOUT CONTRAST TECHNIQUE: Multidetector CT imaging of the abdomen and pelvis was performed following the standard protocol without IV contrast. Sagittal and coronal MPR images reconstructed from axial data set. Oral contrast was not administered. COMPARISON:  None FINDINGS: Lower chest: Emphysematous changes with BILATERAL lower lobe infiltrates consistent with either pneumonia or aspiration. Hepatobiliary: Liver unremarkable. Gallbladder not well visualized due to streak artifacts, grossly unremarkable. Pancreas: Normal appearance Spleen: Normal appearance Adrenals/Urinary Tract: Question adrenal thickening bilaterally. No obvious renal mass or hydronephrosis. Ureters not visualized. Foley catheter decompresses urinary bladder. Stomach/Bowel: Nasogastric tube in stomach. Low-attenuation fluid distends stomach and multiple proximal small bowel loops, with small bowel loops up to 4.1 cm diameter. Distal small bowel loops and colon are decompressed. Scattered stool throughout colon. Findings likely represent mid small bowel obstruction. Appendix not visualized. Suspected rectal wall thickening. Vascular/Lymphatic: Atherosclerotic calcification aorta. Aorta normal caliber. Reproductive: Minimal prostatic enlargement and scattered prostatic calcifications. Other: No definite free air or free fluid. Nonspecific stranding of presacral fat. Musculoskeletal: No acute osseous findings. IMPRESSION: Dilated proximal and decompressed distal small bowel loops compatible with small bowel obstruction, likely in the mid small bowel. Due to lack of fat planes, streak artifacts,  and lack of contrast, unable to localize site of obstruction. No evidence of perforation/free air.  Question rectal wall thickening, recommend correlation with proctoscopy. Emphysematous changes with bibasilar pulmonary infiltrates either representing pneumonia or aspiration. Electronically Signed   By: Ulyses Southward M.D.   On: 04/30/2018 19:38   Dg Chest 2 View  Result Date: 04/12/2018 CLINICAL DATA:  Fever of unknown origin. EXAM: CHEST - 2 VIEW COMPARISON:  None. FINDINGS: The heart size is within normal limits. Ectasia of the thoracic aorta is noted. Pulmonary hyperinflation is seen, consistent with COPD. Mild scarring noted at right lung base as well as several old right rib fracture deformities. No evidence of pulmonary infiltrate, edema, or pleural effusion. IMPRESSION: COPD.  No active cardiopulmonary disease. Electronically Signed   By: Myles Rosenthal M.D.   On: 04/12/2018 19:16   Dg Abd 1 View  Result Date: 05/03/2018 CLINICAL DATA:  Small bowel obstruction EXAM: ABDOMEN - 1 VIEW COMPARISON:  05/02/2018 FINDINGS: NG tube is in the stomach. Gaseous distention of the stomach and left abdominal small bowel loops. Gas and stool within nondistended colon. No free air or organomegaly. IMPRESSION: Gaseous distention of stomach and left abdominal small bowel loops likely reflecting small bowel obstruction. NG tube is in the stomach. Electronically Signed   By: Charlett Nose M.D.   On: 05/03/2018 10:05   Dg Abd 1 View  Result Date: 05/02/2018 CLINICAL DATA:  Small bowel obstruction EXAM: ABDOMEN - 1 VIEW COMPARISON:  May 01, 2018 abdominal radiograph and CT abdomen and pelvis April 30, 2018 FINDINGS: Nasogastric tube no longer present. There is slightly less bowel dilatation in the left upper quadrant compared to 1 day prior. There is moderate air in the stomach. No bowel dilatation elsewhere. No air-fluid levels. No free air. There is moderate stool in the colon. There is atelectatic change in the right lung base. IMPRESSION: Nasogastric tube no longer appreciable. Less small bowel dilatation in the  left upper quadrant. Question resolving bowel obstruction. No free air evident. Electronically Signed   By: Bretta Bang III M.D.   On: 05/02/2018 14:20   Ct Head Wo Contrast  Result Date: 04/29/2018 CLINICAL DATA:  Altered level of consciousness increased over last 2 days, 2 episodes of nausea and vomiting, history of prior brain injury, dementia, former smoker, hypertension EXAM: CT HEAD WITHOUT CONTRAST TECHNIQUE: Contiguous axial images were obtained from the base of the skull through the vertex without intravenous contrast. COMPARISON:  06/08/2010 FINDINGS: Brain: Generalized atrophy. Normal ventricular morphology. No midline shift or mass effect. Small vessel chronic ischemic changes of deep cerebral white matter. 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 No intracranial hemorrhage, mass lesion, evidence of acute infarction, or extra-axial fluid collection. Vascular: Minimal atherosclerotic calcification of internal carotid arteries at skull base Skull: Intact Sinuses/Orbits: Clear Other: N/A IMPRESSION: Atrophy with small vessel chronic ischemic changes of deep cerebral white matter. 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 No acute intracranial abnormalities. Electronically Signed   By: Ulyses Southward M.D.   On: 04/29/2018 09:58   Dg Chest Port 1 View  Result Date: 04/29/2018 CLINICAL DATA:  Altered mental status EXAM: PORTABLE CHEST 1 VIEW COMPARISON:  April 12, 2018 FINDINGS: There is mild scarring in the lateral right base and in the medial right lower lung region. There is no edema or consolidation. The heart size and pulmonary vascularity are normal. No adenopathy. There is a recent appearing fracture of the posterior right seventh rib with alignment essentially  anatomic. An older fracture of the posterior right eighth rib is noted. No pneumothorax. IMPRESSION: Recent appearing fracture posterior right seventh rib. Older fracture posterior right eighth rib noted. Areas of  mild scarring noted on the right. No edema or consolidation. Stable cardiac silhouette. Electronically Signed   By: Bretta Bang III M.D.   On: 04/29/2018 08:46   Dg Chest Port 1v Same Day  Result Date: 05/01/2018 CLINICAL DATA:  history of schizophrenia, hypertension, BPH-mostly wheelchair-bound-presented to the hospital with approximately 1 week history of intermittent vomiting, confusion for a few days prior to this hospital admission-presented with sepsis secondary to UTI, and significant constipation. C/o SOB EXAM: PORTABLE CHEST - 1 VIEW SAME DAY COMPARISON:  04/29/2018 FINDINGS: Lungs are hyperinflated. Subsegmental atelectasis or early infiltrate at the left lung base, new since previous. Right lung clear. Heart size normal.  Tortuous ectatic thoracic aorta. No effusion. No pneumothorax. Right anterolateral fourth rib fracture, age indeterminate. Nasogastric tube extends to the stomach. IMPRESSION: 1. New patchy infiltrate or subsegmental atelectasis at the left lung base. 2. Right fourth rib fracture without pneumothorax. 3. Nasogastric tube placement to the stomach. Electronically Signed   By: Corlis Leak M.D.   On: 05/01/2018 08:24   Dg Abd 2 Views  Result Date: 05/05/2018 CLINICAL DATA:  Vomiting.  History of right lumpectomy. EXAM: ABDOMEN - 2 VIEW COMPARISON:  05/04/2018 FINDINGS: Improve dilation of small bowel loops with residual borderline gas-filled distended small bowel loops in the left upper quadrant of the abdomen. Normal colonic bowel gas pattern. No evidence of free intra-abdominal gas. Normal cardiac silhouette. Tortuosity of the thoracic aorta. Right pleural reflection versus small pleural effusion. No lobar airspace consolidation. Enteric catheter tip within the expected location the gastric body, side hole at the GE junction level. Right PICC line terminates in the expected location of the cavoatrial junction. IMPRESSION: Improved appearance of small-bowel obstruction.  Enteric catheter with side hole at the level of the GE junction. Advancement may be considered. Possible small right pleural effusion. Electronically Signed   By: Ted Mcalpine M.D.   On: 05/05/2018 12:51   Dg Abd Acute W/chest  Result Date: 05/04/2018 CLINICAL DATA:  Follow-up small bowel obstruction. EXAM: DG ABDOMEN ACUTE W/ 1V CHEST COMPARISON:  Abdominal x-rays 05/04/1999 19 dating back to 04/29/2018. CT abdomen and pelvis 04/30/2018. Chest x-rays 05/01/2018, 04/12/2018. FINDINGS: Nasogastric tube tip in the fundus of the stomach. Persistent marked gaseous distension of the jejunum in the LEFT UPPER QUADRANT, slowly progressive over the past 3 days, with air-fluid levels on the LATERAL decubitus image. No evidence of free intraperitoneal air. Normal caliber colon with moderate stool burden. Calcified uterine fibroid in the pelvic midline. Cardiac silhouette normal in size, unchanged. Thoracic aorta tortuous and atherosclerotic. Hilar and mediastinal contours otherwise unremarkable. Interval improvement in aeration in the LEFT lung base, with only minimal patchy opacities persisting. Lungs otherwise clear. Emphysematous changes in both lungs with scarring in the RIGHT mid lung, unchanged. Pleuroparenchymal scarring at the RIGHT lung base which accounts for the blunting of the costophrenic angle, unchanged dating back to the original 04/12/2018 examination. IMPRESSION: 1. Persistent partial small bowel obstruction which has slowly worsened over the past 3 days. 2. No evidence of free intraperitoneal air. 3. Improved aeration in the LEFT LOWER LOBE, with only mild atelectasis and/or pneumonia persisting. Electronically Signed   By: Hulan Saas M.D.   On: 05/04/2018 09:28   Dg Abd Portable 1v  Result Date: 05/08/2018 CLINICAL DATA:  Follow up small  bowel obstruction EXAM: PORTABLE ABDOMEN - 1 VIEW COMPARISON:  05/06/2018 FINDINGS: Scattered large and small bowel gas is noted. Multiple dilated  loops of small bowel are again identified and stable in appearance. Nasogastric catheter is again noted within the stomach. No free air is seen. Degenerative changes of the lumbar spine are noted. IMPRESSION: Persistent small bowel dilatation. No new focal abnormality is noted. Electronically Signed   By: Alcide Clever M.D.   On: 05/08/2018 08:36   Dg Abd Portable 1v  Result Date: 05/06/2018 CLINICAL DATA:  Small bowel obstruction EXAM: PORTABLE ABDOMEN - 1 VIEW COMPARISON:  Portable exam at 0628 hours compared to 05/05/2018 FINDINGS: Stool scattered throughout colon. Persistent dilatation of small bowel loops in the LEFT mid abdomen up to 4.9 cm diameter. No bowel wall thickening. Bones demineralized. No definite renal calcifications. IMPRESSION: Persistent small bowel dilatation consistent with small bowel obstruction. Little interval change. Electronically Signed   By: Ulyses Southward M.D.   On: 05/06/2018 10:21   Dg Abd Portable 1v-small Bowel Obstruction Protocol-initial, 8 Hr Delay  Result Date: 05/04/2018 CLINICAL DATA:  Small bowel obstruction protocol. 8 hour delay. EXAM: PORTABLE ABDOMEN - 1 VIEW COMPARISON:  05/03/2018 FINDINGS: Gaseous distention of left upper quadrant small bowel. Enteric tube with tip projected over the mid abdomen consistent with location in the upper stomach. Contrast material is demonstrated in the dilated small bowel. No contrast material is identified in the colon. This is suggestive of high-grade obstruction. Degenerative changes in the spine. IMPRESSION: Contrast material is demonstrated in the dilated small bowel but not in the colon consistent with high-grade obstruction. Electronically Signed   By: Burman Nieves M.D.   On: 05/04/2018 01:55   Dg Abd Portable 1v-small Bowel Obstruction Protocol-initial, 8 Hr Delay  Result Date: 05/01/2018 CLINICAL DATA:  Small bowel obstruction. EXAM: PORTABLE ABDOMEN - 1 VIEW COMPARISON:  Radiograph earlier this day at 1109 hour, CT  yesterday. FINDINGS: Enteric tube in place with tip in the stomach, side-port just beyond the gastroesophageal junction. Improving small bowel dilatation in the left abdomen. Moderate stool in the right and transverse colon. No evidence of free air. Pelvic calcification may be stone in the urinary bladder or exophytic prostate calcification. IMPRESSION: Improving small bowel dilatation in the left abdomen since radiograph earlier this day. Electronically Signed   By: Narda Rutherford M.D.   On: 05/01/2018 21:14   Dg Abd Portable 1v-small Bowel Protocol-position Verification  Result Date: 05/01/2018 CLINICAL DATA:  Nasogastric tube placement EXAM: PORTABLE ABDOMEN - 1 VIEW COMPARISON:  Portable exam 1109 hours compared to 04/30/2018 FINDINGS: Tip of nasogastric tube projects over mid stomach. Dilated small bowel loops are seen in the LEFT mid abdomen. Increased stool in RIGHT colon. Lung bases appear emphysematous with subsegmental atelectasis at LEFT base. Bones demineralized. IMPRESSION: Tip of nasogastric tube projects over mid stomach. Small bowel dilatation in LEFT mid abdomen with increased stool in RIGHT colon. Electronically Signed   By: Ulyses Southward M.D.   On: 05/01/2018 11:22   Dg Abd Portable 1v  Result Date: 04/30/2018 CLINICAL DATA:  67 year old male with vomiting. EXAM: PORTABLE ABDOMEN - 1 VIEW COMPARISON:  04/29/2018 abdominal radiographs. FINDINGS: Portable AP semi upright and supine views at 0847 hours. Enteric tube has been placed and the stomach now appears decompressed. NG tube side hole at the gastric body. Non obstructed bowel gas pattern. Moderate volume of retained stool redemonstrated in the transverse colon and distal sigmoid/rectum. Negative visible lung bases. No pneumoperitoneum. Stable abdominal  and pelvic visceral contours. Dystrophic calcification in the pelvis. Stable pelvic catheter, likely a Foley. No acute osseous abnormality identified. IMPRESSION: 1. NG tube in place with  resolved gastric distention. 2. Non obstructed bowel gas pattern with moderate volume of retained stool. Electronically Signed   By: Odessa Fleming M.D.   On: 04/30/2018 09:06   Dg Abd Portable 1v  Result Date: 04/29/2018 CLINICAL DATA:  Nausea and vomiting EXAM: PORTABLE ABDOMEN - 1 VIEW COMPARISON:  None. FINDINGS: Gaseous distention of the stomach. Formed stool throughout the colon. No concerning mass effect or calcification. IMPRESSION: Prominent gaseous distension of the stomach. Constipated appearance. Electronically Signed   By: Marnee Spring M.D.   On: 04/29/2018 14:44   [place] Ekg Site Rite  Result Date: 05/04/2018 If Site Rite image not attached, placement could not be confirmed due to current cardiac rhythm.    LOS: 9 days   Signature  Susa Raring M.D on 05/08/2018 at 12:28 PM  To page go to [email or web address] - password [email or web address]

## 2018-05-08 NOTE — Progress Notes (Addendum)
Patient's temp was 100.2  8:20 pm and his sister stated patient has fever and demanded MD notified for Tylenol  IV for him. On assessment, patient had  lots of blankets and the room thermostat was turned to the maximum. This RN informed the sister that we treat temp when is greater or equal to  than 101 and will certainly notify the provider if recheck falls within that parameter. She agreed to let me take some of the brackets off and turned the thermostat down. Rechecked temp was 99.2 No acute distress noted or any s/s of pain. Sister  left home and called back to check on his status. The sister was updated on his vital signs and his condition. She was appreciative. Will continue to monitor.

## 2018-05-08 NOTE — Progress Notes (Signed)
Temp went up to 101.1 this morning. [place] NP notified with  a  new order for 500 mL NS bolus x 1 dose. Bolus is infusing. Will recheck temp after the bolus. Will continue to monitor.
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
## 2018-05-08 NOTE — Evaluation (Signed)
Occupational Therapy Evaluation Patient Details Name: Jeffrey Davila MRN: [id number] DOB: 1950-09-13 Today's Date: 05/08/2018    History of Present Illness Pt is a 67 y/o male admitted secondary to AMS and Sepsis most likely secondary to UTI. CT negative for acute abnormality. PMH inlcudes BI, dementia, HTN, and schizophrenia.    Clinical Impression   Pt admitted with above dx. Eval limited d/t pt becoming increasingly agitated. Pt impulsively came EOB with (S) and then refused to follow further commands, becoming agitated with any more intervention attempts. Pt returned supine with min A. Anticipate high burden of care for family if pt were to return home, OT recommending SNF placement. OT will continue to follow as appropriate while inpatient.     Follow Up Recommendations  SNF;Supervision/Assistance - 24 hour    Equipment Recommendations  Other (comment)(defer to next venue)    Recommendations for Other Services PT consult;Speech consult     Precautions / Restrictions Precautions Precautions: Fall Precaution Comments: NG tube Restrictions Weight Bearing Restrictions: No      Mobility Bed Mobility Overal bed mobility: Needs Assistance Bed Mobility: Rolling;Sidelying to Sit Rolling: Supervision Sidelying to sit: Supervision Supine to sit: Supervision     General bed mobility comments: Pt impulsively sat up, coming EOB with (S)  Transfers Overall transfer level: (Pt refused any further mobility)                    Balance Overall balance assessment: Needs assistance Sitting-balance support: No upper extremity supported;Feet supported Sitting balance-Leahy Scale: Good                                     ADL either performed or assessed with clinical judgement   ADL Overall ADL's : Needs assistance/impaired     Grooming: Wash/dry face;Set up;Sitting;Cueing for safety   Upper Body Bathing: Total assistance;Sitting       Upper Body  Dressing : Maximal assistance;Sitting                     General ADL Comments: Pt washed face and then became agitated with any further bathing/dressing     Vision Baseline Vision/History: (Unknown) Patient Visual Report: (Pt unable to answer, pt tracking therapist)              Pertinent Vitals/Pain Pain Assessment: Faces Faces Pain Scale: Hurts little more Pain Location: When questioned re pain pt stated stomach and nothing else Pain Descriptors / Indicators: Grimacing Pain Intervention(s): Limited activity within patient's tolerance;Monitored during session        Extremity/Trunk Assessment Upper Extremity Assessment Upper Extremity Assessment: Difficult to assess due to impaired cognition RUE Deficits / Details: Pt with guarded B UE in flexion LUE Deficits / Details: Pt with guarded B UE in flexion   Lower Extremity Assessment Lower Extremity Assessment: Difficult to assess due to impaired cognition   Cervical / Trunk Assessment Cervical / Trunk Assessment: Kyphotic   Communication Communication Communication: Expressive difficulties   Cognition Arousal/Alertness: Lethargic Behavior During Therapy: Flat affect;Impulsive;Agitated Overall Cognitive Status: History of cognitive impairments - at baseline                                 General Comments: 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   General Comments  No family in room, pt becoming  increasingly agitated, throwing washcloth and glaring at therapist, grunting throughout session            Home Living Family/patient expects to be discharged to:: Private residence Living Arrangements: Other relatives Available Help at Discharge: Family;Available 24 hours/day Type of Home: House Home Access: Level entry     Home Layout: One level     Bathroom Shower/Tub: Tub/shower unit         Home Equipment: Wheelchair - manual;Shower seat   Additional Comments: All home hx  obtained from chart, pt unable to answer      Prior Functioning/Environment Level of Independence: Needs assistance  Gait / Transfers Assistance Needed: Unclear PLOF- pt sister giving conflicting info              OT Problem List: Decreased cognition;Impaired UE functional use;Impaired tone;Decreased knowledge of use of DME or AE;Decreased safety awareness;Decreased coordination;Impaired balance (sitting and/or standing);Decreased activity tolerance;Decreased range of motion;Decreased strength      OT Treatment/Interventions: Self-care/ADL training;Therapeutic activities;Therapeutic exercise;Energy conservation;DME and/or AE instruction;Cognitive remediation/compensation;Patient/family education;Balance training;Manual therapy    OT Goals(Current goals can be found in the care plan section) Acute Rehab OT Goals Patient Stated Goal: pt unable OT Goal Formulation: Patient unable to participate in goal setting Time For Goal Achievement: 05/18/18 Potential to Achieve Goals: Poor  OT Frequency: Min 2X/week    AM-PAC PT "6 Clicks" Daily Activity     Outcome Measure Help from another person eating meals?: Total Help from another person taking care of personal grooming?: A Little Help from another person toileting, which includes using toliet, bedpan, or urinal?: Total Help from another person bathing (including washing, rinsing, drying)?: Total Help from another person to put on and taking off regular upper body clothing?: Total Help from another person to put on and taking off regular lower body clothing?: Total 6 Click Score: 8   End of Session Nurse Communication: Mobility status  Activity Tolerance: Patient limited by lethargy;Treatment limited secondary to agitation Patient left: in bed;with call bell/phone within reach;with bed alarm set;Other (comment)(telesitter present)  OT Visit Diagnosis: Muscle weakness (generalized) (M62.81);Other symptoms and signs involving cognitive  function                Time: 1914-7829 OT Time Calculation (min): 25 min Charges:  OT General Charges $OT Visit: 1 Visit OT Evaluation $OT Eval Moderate Complexity: 1 Mod OT Treatments $Self Care/Home Management : 8-22 mins  Crissie Reese OTR/L 05/08/2018, 10:26 AM

## 2018-05-09 ENCOUNTER — Inpatient Hospital Stay (HOSPITAL_COMMUNITY): Payer: Medicare Other

## 2018-05-09 LAB — PROTIME-INR
INR: 1.17
PROTHROMBIN TIME: 14.8 s (ref 11.4–15.2)

## 2018-05-09 LAB — COMPREHENSIVE METABOLIC PANEL
ALBUMIN: 2.2 g/dL — AB (ref 3.5–5.0)
ALT: 6 U/L (ref 0–44)
AST: 7 U/L — AB (ref 15–41)
Alkaline Phosphatase: 45 U/L (ref 38–126)
Anion gap: 4 — ABNORMAL LOW (ref 5–15)
BUN: 23 mg/dL (ref 8–23)
CHLORIDE: 110 mmol/L (ref 98–111)
CO2: 25 mmol/L (ref 22–32)
CREATININE: 0.59 mg/dL — AB (ref 0.61–1.24)
Calcium: 8.6 mg/dL — ABNORMAL LOW (ref 8.9–10.3)
GFR calc Af Amer: >60 mL/min (ref >60)
GFR calc non Af Amer: >60 mL/min (ref >60)
Glucose, Bld: 97 mg/dL (ref 70–99)
Potassium: 4.4 mmol/L (ref 3.5–5.1)
SODIUM: 139 mmol/L (ref 135–145)
Total Bilirubin: 0.2 mg/dL — ABNORMAL LOW (ref 0.3–1.2)
Total Protein: 5.9 g/dL — ABNORMAL LOW (ref 6.5–8.1)

## 2018-05-09 LAB — TRIGLYCERIDES: Triglycerides: 74 mg/dL (ref <150)

## 2018-05-09 LAB — DIFFERENTIAL
ABS IMMATURE GRANULOCYTES: 0.22 10*3/uL — AB (ref 0.00–0.07)
Basophils Absolute: 0.1 10*3/uL (ref 0.0–0.1)
Basophils Relative: 0 %
Eosinophils Absolute: 0.2 10*3/uL (ref 0.0–0.5)
Eosinophils Relative: 1 %
Immature Granulocytes: 1 %
LYMPHS ABS: 2.2 10*3/uL (ref 0.7–4.0)
Lymphocytes Relative: 13 %
MONOS PCT: 6 %
Monocytes Absolute: 1 10*3/uL (ref 0.1–1.0)
NEUTROS ABS: 13.4 10*3/uL — AB (ref 1.7–7.7)
Neutrophils Relative %: 79 %

## 2018-05-09 LAB — HEMOGLOBIN AND HEMATOCRIT, BLOOD
HEMATOCRIT: 31.2 % — AB (ref 39.0–52.0)
Hemoglobin: 10.3 g/dL — ABNORMAL LOW (ref 13.0–17.0)

## 2018-05-09 LAB — CBC
HCT: 24.3 % — ABNORMAL LOW (ref 39.0–52.0)
HEMOGLOBIN: 7.4 g/dL — AB (ref 13.0–17.0)
MCH: 29 pg (ref 26.0–34.0)
MCHC: 30.5 g/dL (ref 30.0–36.0)
MCV: 95.3 fL (ref 80.0–100.0)
Platelets: 681 10*3/uL — ABNORMAL HIGH (ref 150–400)
RBC: 2.55 MIL/uL — AB (ref 4.22–5.81)
RDW: 15.6 % — ABNORMAL HIGH (ref 11.5–15.5)
WBC: 17 10*3/uL — ABNORMAL HIGH (ref 4.0–10.5)
nRBC: 0 % (ref 0.0–0.2)

## 2018-05-09 LAB — PREALBUMIN: Prealbumin: 17.5 mg/dL — ABNORMAL LOW (ref 18–38)

## 2018-05-09 LAB — MAGNESIUM: Magnesium: 2 mg/dL (ref 1.7–2.4)

## 2018-05-09 LAB — GLUCOSE, CAPILLARY
Glucose-Capillary: 82 mg/dL (ref 70–99)
Glucose-Capillary: 89 mg/dL (ref 70–99)
Glucose-Capillary: 90 mg/dL (ref 70–99)
Glucose-Capillary: 96 mg/dL (ref 70–99)
Glucose-Capillary: 97 mg/dL (ref 70–99)

## 2018-05-09 LAB — PHOSPHORUS: Phosphorus: 3 mg/dL (ref 2.5–4.6)

## 2018-05-09 LAB — PREPARE RBC (CROSSMATCH)

## 2018-05-09 MED ORDER — SODIUM CHLORIDE 0.9% IV SOLUTION
Freq: Once | INTRAVENOUS | Status: AC
  Administered 2018-05-09: 11:00:00 via INTRAVENOUS

## 2018-05-09 MED ORDER — HEPARIN SODIUM (PORCINE) 5000 UNIT/ML IJ SOLN
5000.0000 [IU] | Freq: Three times a day (TID) | INTRAMUSCULAR | Status: DC
  Administered 2018-05-09: 5000 [IU] via SUBCUTANEOUS
  Filled 2018-05-09: qty 1

## 2018-05-09 MED ORDER — CHLORHEXIDINE GLUCONATE CLOTH 2 % EX PADS
6.0000 | MEDICATED_PAD | CUTANEOUS | Status: DC
  Administered 2018-05-10 – 2018-05-31 (×6): 6 via TOPICAL

## 2018-05-09 MED ORDER — TRAVASOL 10 % IV SOLN
INTRAVENOUS | Status: AC
  Administered 2018-05-09: 17:00:00 via INTRAVENOUS
  Filled 2018-05-09: qty 936

## 2018-05-09 MED ORDER — FUROSEMIDE 10 MG/ML IJ SOLN
20.0000 mg | Freq: Once | INTRAMUSCULAR | Status: AC
  Administered 2018-05-09: 20 mg via INTRAVENOUS
  Filled 2018-05-09: qty 2

## 2018-05-09 MED ORDER — MUPIROCIN 2 % EX OINT: 1.0000 "application " | TOPICAL_OINTMENT | Freq: Two times a day (BID) | CUTANEOUS | Status: DC

## 2018-05-09 MED ORDER — DIPHENHYDRAMINE HCL 50 MG/ML IJ SOLN
25.0000 mg | Freq: Four times a day (QID) | INTRAMUSCULAR | Status: DC | PRN
  Administered 2018-05-12: 25 mg via INTRAVENOUS
  Filled 2018-05-09: qty 1
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
## 2018-05-09 NOTE — Progress Notes (Addendum)
PROGRESS NOTE        PATIENT DETAILS Name: Jeffrey Davila Age: 67 y.o. Sex: male Date of Birth: 1950-12-05 Admit Date: 04/29/2018 Admitting Physician Starleen Arms, MD PCP:Uba, Reuel Boom, MD  Brief Narrative: Patient is a 67 y.o. male with history of schizophrenia/?  Cognitive dysfunction (dementia), hypertension, BPH, remote history (in the 1990s) of trauma causing small bowel injury requiring laparotomy-mostly wheelchair-bound-presented to the hospital with approximately 1 week history of intermittent vomiting, confusion for a few days prior to this hospital admission-presented with sepsis secondary to UTI, vomiting and significant constipation.  Patient was started on IV antibiotics and admitted to the hospital service,initial imaging was suggestive of constipation causing gastric distention-NG tube was placed, however upon further evaluation with a CT scan he was found  to have a small bowel obstruction.  General surgery was consulted, even with n.p.o. status/NG tube decompression-he continues to have SBO-patient's sister has refused to sign consent for surgery, and wants to continue with TNA for a few days so that he can "get stronger" before consenting to surgery.  See below for further details  Subjective:  Patient in bed, appears comfortable, denies any headache, no fever, no chest pain or pressure, no shortness of breath, does have mild ongoing abdominal pain. No focal weakness.  Assessment/Plan:  Sepsis secondary to complicated UTI: Sepsis pathophysiology has resolved, urine culture positive for Klebsiella-has completed a course of IV Rocephin.  Blood cultures remain negative.    Aspiration pneumonia: Secondary to SBO/vomiting with underlying frailty.  He still occasionally seems to accumulate oral secretions-but he is clinically improved, his lungs were clear today without any transmitted upper airway sounds.  He has completed a course of Rocephin/Flagyl.   Continue to suction as needed, continue to mobilize out of bed to chair if possible.  If patient is able to participate in incentive spirometry- encourage incentive spirometry.  Clinically stable pneumonia standpoint continue to monitor.  SBO: Probably secondary to adhesions/scar tissue related to remote history of small bowel injury from trauma.  In spite of NG tube decompression/n.p.o. status-continues to have persistent SBO.  Will need surgery however patient's sister not deciding on surgery yet and holding off, clearly told the risk of bowel perforation and ensuing complications including sepsis and death.  She also requested [facility] and [facility] team declined it.  For now on IV TNA, sister now states that she might agree for surgery on coming Tuesday, 05/10/2018, told that this will be deferred to the general surgeon, surgery team to see the patient soon.  Kindly see previous notes by Dr. Jerral Ralph with a detailed documentation.  Note patient's sister had requested that surgical outcome be guaranteed to the previous team and she was clearly told that there are no guarantees in medicine.  The risks and benefits were explained.  Patient will be a moderate risk candidate for adverse cardiopulmonary outcome during the perioperative.,  Due to his frail underlying condition , poor nutritional status and his other chronic comorbidities.  Acute metabolic encephalopathy: Patient was much more lethargic and confused on his usual baseline on initial presentation-with supportive care-encephalopathy has resolved.  I suspect he is back to his usual baseline.    Anemia: Combination of anemia of chronic disease, frequent blood draws, iron panel inconclusive, received 1 unit of packed RBC so far this admission, continue to monitor H&H, type screen done.  Hyperkalemia: Occurred on 10/10-resolved.   Schizophrenia: Stable-continue IV Depakote and ODT olanzapine.  Is n.p.o with active NG tube decompression-unable to  restart Cogentin (per pharmacy no other route that this can be given). Note-per pharmacy-hospitalist service is unable to use parenteral olanzapine-this is limited psychiatry and critical care.   Acute kidney injury: Hemodynamically mediated-resolved.   Hypernatremia: Resolved with hypotonic saline.  This was secondary to vomiting and dehydration.  Now on TNA.  Hypertension: Stable-continue to hold antihypertensives.    Severe malnutrition: Once diet has been started-we will start oral supplements-has been started on TNA  Deconditioning/debility: Suspect has significant amount of debility at baseline-he is very cachectic-deconditioning/debility has worsened due to acute illness/SBO.  PT evaluation appreciated.  Attempt to mobilize secretions, attempt to mobilize out of bed to chair.     DVT Prophylaxis: Prophylactic Heparin   Code Status: Full code   Family Communication: Sister over the phone by me on 05/08/2018, bedside on 05/09/2018.  She also provided phone number for patient's PCP in [facility] [address] [place] office phone #[phone], called the office at 8:30 AM on 05/09/2018 and left my cell phone number with the secretary for him to call me, was not in the office.  Disposition Plan: Remain inpatient-not stable for discharge  Antimicrobial agents: Anti-infectives (From admission, onward)   Start     Dose/Rate Route Frequency Ordered Stop   05/01/18 1215  metroNIDAZOLE (FLAGYL) IVPB 500 mg  Status:  Discontinued     500 mg 100 mL/hr over 60 Minutes Intravenous Every 8 hours 05/01/18 1201 05/06/18 1153   04/30/18 1100  cefTRIAXone (ROCEPHIN) 2 g in sodium chloride 0.9 % 100 mL IVPB  Status:  Discontinued     2 g 200 mL/hr over 30 Minutes Intravenous Every 24 hours 04/29/18 1416 05/06/18 1153   04/29/18 1430  cefTRIAXone (ROCEPHIN) 1 g in sodium chloride 0.9 % 100 mL IVPB     1 g 200 mL/hr over 30 Minutes Intravenous Every 24 hours 04/29/18 1416 04/30/18 1429   04/29/18 1130   cefTRIAXone (ROCEPHIN) 1 g in sodium chloride 0.9 % 100 mL IVPB     1 g 200 mL/hr over 30 Minutes Intravenous  Once 04/29/18 1118 04/29/18 1251      Procedures:  NG Tube  PICC Line   CONSULTS:  CCS  Time spent: 25 minutes minutes-Greater than 50% of this time was spent in counseling, explanation of diagnosis, planning of further management, and coordination of care.  MEDICATIONS: Scheduled Meds: . sodium chloride   Intravenous Once  . benztropine  1 mg Oral BID  . furosemide  20 mg Intravenous Once  . heparin  5,000 Units Subcutaneous Q8H  . insulin aspart  0-9 Units Subcutaneous Q4H  . OLANZapine zydis  10 mg Oral BID  . polyvinyl alcohol  1 drop Both Eyes TID   Continuous Infusions: . sodium chloride 500 mL (05/05/18 1429)  . TPN ADULT (ION) 65 mL/hr at 05/09/18 0600  . valproate sodium 250 mg (05/09/18 0600)   PRN Meds:.sodium chloride, acetaminophen, albuterol, diphenhydrAMINE, metoprolol tartrate, ondansetron (ZOFRAN) IV, sodium chloride flush   PHYSICAL EXAM: Vital signs: Vitals:   05/08/18 0642 05/08/18 1602 05/08/18 2224 05/09/18 0605  BP:  104/60 114/78 106/65  Pulse:  81 90 76  Resp:  18 18 16   Temp: 98.3 F (36.8 C) 98.1 F (36.7 C) 99.1 F (37.3 C) 98.3 F (36.8 C)  TempSrc:  Oral Oral Oral  SpO2:  100% 100% 100%  Weight:  Height:       Filed Weights   04/29/18 0842  Weight: 49.9 kg   Body mass index is 17.75 kg/m.    Exam  Awake Alert, moves all 4 extremities to commands, answers questions in single words of yes and no [place].AT,PERRAL, NG tube in place Supple Neck,No JVD, No cervical lymphadenopathy appriciated.  Symmetrical Chest wall movement, Good air movement bilaterally, CTAB RRR,No Gallops, Rubs or new Murmurs, No Parasternal Heave +ve B.Sounds, Abd Soft, No tenderness, No organomegaly appriciated, No rebound - guarding or rigidity. No Cyanosis, Clubbing or edema, No new Rash or bruise    I have personally reviewed  following labs and imaging studies  LABORATORY DATA:  CBC:  Recent Labs  Lab 05/05/18 1012 05/06/18 1252 05/07/18 0430 05/08/18 0804 05/09/18 0412  WBC 15.8* 14.2* 13.9* 15.1* 17.0*  NEUTROABS 13.8*  --   --   --  13.4*  HGB 7.4* 8.4* 7.9* 7.8* 7.4*  HCT 24.6* 25.4* 26.0* 23.2* 24.3*  MCV 95.7 94.1 94.5 100.9* 95.3  PLT 438* 516* 605* 588* 681*    Basic Metabolic Panel: Recent Labs  Lab 05/04/18 0826 05/05/18 1012 05/05/18 1552 05/06/18 0355 05/07/18 0430 05/08/18 1050 05/09/18 0412  NA  --  138 140 142 140 139 139  K  --  6.4* 3.9 3.4* 3.6 4.1 4.4  CL  --  105 104 104 105 110 110  CO2  --  27 27 29 27 25 25   GLUCOSE  --  98 49* 106* 103* 98 97  BUN  --  13 13 10 16  24* 23  CREATININE  --  0.74 0.76 0.73 0.69 0.63 0.59*  CALCIUM  --  8.1* 8.4* 8.3* 8.6* 8.3* 8.6*  MG 1.9 1.6*  --  2.3 2.1  --  2.0  PHOS  --  1.4*  --  2.9 2.5  --  3.0    GFR: Estimated Creatinine Clearance: 64.1 mL/min (A) (by C-G formula based on SCr of 0.59 mg/dL (L)).  Liver Function Tests: Recent Labs  Lab 05/05/18 1012 05/09/18 0412  AST 8* 7*  ALT 6 6  ALKPHOS 46 45  BILITOT 0.5 0.2*  PROT 5.0* 5.9*  ALBUMIN 2.2* 2.2*   No results for input(s): LIPASE, AMYLASE in the last 168 hours. No results for input(s): AMMONIA in the last 168 hours.  Coagulation Profile: Recent Labs  Lab 05/09/18 0412  INR 1.17    Cardiac Enzymes: No results for input(s): CKTOTAL, CKMB, CKMBINDEX, TROPONINI in the last 168 hours.  BNP (last 3 results) No results for input(s): PROBNP in the last 8760 hours.  HbA1C: No results for input(s): HGBA1C in the last 72 hours.  CBG: Recent Labs  Lab 05/08/18 1601 05/08/18 2014 05/09/18 0015 05/09/18 0556 05/09/18 0805  GLUCAP 102* 93 90 82 89    Lipid Profile: Recent Labs    05/09/18 0412  TRIG 74    Thyroid Function Tests: No results for input(s): TSH, T4TOTAL, FREET4, T3FREE, THYROIDAB in the last 72 hours.  Anemia Panel: Recent  Labs    05/08/18 0804 05/08/18 1140  VITAMINB12 740  --   FOLATE  --  8.6  FERRITIN 58  --   TIBC 157*  --   IRON 11*  --   RETICCTPCT 0.6  --     Urine analysis:    Component Value Date/Time   COLORURINE AMBER (A) 04/29/2018 1059   APPEARANCEUR CLOUDY (A) 04/29/2018 1059   LABSPEC 1.020 04/29/2018 1059  PHURINE 6.5 04/29/2018 1059   GLUCOSEU NEGATIVE 04/29/2018 1059   HGBUR LARGE (A) 04/29/2018 1059   BILIRUBINUR MODERATE (A) 04/29/2018 1059   KETONESUR 15 (A) 04/29/2018 1059   PROTEINUR 30 (A) 04/29/2018 1059   UROBILINOGEN 1.0 06/08/2010 1523   NITRITE POSITIVE (A) 04/29/2018 1059   LEUKOCYTESUR MODERATE (A) 04/29/2018 1059    Sepsis Labs: Lactic Acid, Venous    Component Value Date/Time   LATICACIDVEN 1.07 04/29/2018 1334    MICROBIOLOGY: Recent Results (from the past 240 hour(s))  Culture, Urine     Status: Abnormal   Collection Time: 04/29/18 11:57 AM  Result Value Ref Range Status   Specimen Description URINE, RANDOM  Final   Special Requests   Final    NONE Performed at [facility], [address], [place], [place] [address]    Culture >=100,000 COLONIES/mL KLEBSIELLA PNEUMONIAE (A)  Final   Report Status 05/01/2018 FINAL  Final   Organism ID, Bacteria KLEBSIELLA PNEUMONIAE (A)  Final      Susceptibility   Klebsiella pneumoniae - MIC*    AMPICILLIN >=32 RESISTANT Resistant     CEFAZOLIN <=4 SENSITIVE Sensitive     CEFTRIAXONE <=1 SENSITIVE Sensitive     CIPROFLOXACIN <=0.25 SENSITIVE Sensitive     GENTAMICIN <=1 SENSITIVE Sensitive     IMIPENEM 0.5 SENSITIVE Sensitive     NITROFURANTOIN 64 INTERMEDIATE Intermediate     TRIMETH/SULFA <=20 SENSITIVE Sensitive     AMPICILLIN/SULBACTAM 16 INTERMEDIATE Intermediate     PIP/TAZO 16 SENSITIVE Sensitive     Extended ESBL NEGATIVE Sensitive     * >=100,000 COLONIES/mL KLEBSIELLA PNEUMONIAE  Culture, blood (routine x 2)     Status: None   Collection Time: 04/29/18  1:25 PM  Result Value Ref Range  Status   Specimen Description BLOOD RIGHT ANTECUBITAL  Final   Special Requests   Final    BOTTLES DRAWN AEROBIC AND ANAEROBIC Blood Culture results may not be optimal due to an excessive volume of blood received in culture bottles   Culture   Final    NO GROWTH 5 DAYS Performed at [facility], [address], [place], [place] [address]    Report Status 05/04/2018 FINAL  Final  Culture, blood (routine x 2)     Status: None   Collection Time: 04/30/18  6:18 AM  Result Value Ref Range Status   Specimen Description BLOOD RIGHT ANTECUBITAL  Final   Special Requests   Final    BOTTLES DRAWN AEROBIC ONLY Blood Culture adequate volume   Culture   Final    NO GROWTH 5 DAYS Performed at [facility], [address], [place], [place] [address]    Report Status 05/05/2018 FINAL  Final    RADIOLOGY STUDIES/RESULTS: Ct Abdomen Pelvis Wo Contrast  Result Date: 04/30/2018 CLINICAL DATA:  Nausea, bilious vomiting EXAM: CT ABDOMEN AND PELVIS WITHOUT CONTRAST TECHNIQUE: Multidetector CT imaging of the abdomen and pelvis was performed following the standard protocol without IV contrast. Sagittal and coronal MPR images reconstructed from axial data set. Oral contrast was not administered. COMPARISON:  None FINDINGS: Lower chest: Emphysematous changes with BILATERAL lower lobe infiltrates consistent with either pneumonia or aspiration. Hepatobiliary: Liver unremarkable. Gallbladder not well visualized due to streak artifacts, grossly unremarkable. Pancreas: Normal appearance Spleen: Normal appearance Adrenals/Urinary Tract: Question adrenal thickening bilaterally. No obvious renal mass or hydronephrosis. Ureters not visualized. Foley catheter decompresses urinary bladder. Stomach/Bowel: Nasogastric tube in stomach. Low-attenuation fluid distends stomach and  multiple proximal small bowel loops, with small bowel loops up to 4.1 cm diameter. Distal small bowel loops and colon are decompressed.  Scattered stool throughout colon. Findings likely represent mid small bowel obstruction. Appendix not visualized. Suspected rectal wall thickening. Vascular/Lymphatic: Atherosclerotic calcification aorta. Aorta normal caliber. Reproductive: Minimal prostatic enlargement and scattered prostatic calcifications. Other: No definite free air or free fluid. Nonspecific stranding of presacral fat. Musculoskeletal: No acute osseous findings. IMPRESSION: Dilated proximal and decompressed distal small bowel loops compatible with small bowel obstruction, likely in the mid small bowel. Due to lack of fat planes, streak artifacts, and lack of contrast, unable to localize site of obstruction. No evidence of perforation/free air. Question rectal wall thickening, recommend correlation with proctoscopy. Emphysematous changes with bibasilar pulmonary infiltrates either representing pneumonia or aspiration. Electronically Signed   By: Ulyses Southward M.D.   On: 04/30/2018 19:38   Dg Chest 2 View  Result Date: 04/12/2018 CLINICAL DATA:  Fever of unknown origin. EXAM: CHEST - 2 VIEW COMPARISON:  None. FINDINGS: The heart size is within normal limits. Ectasia of the thoracic aorta is noted. Pulmonary hyperinflation is seen, consistent with COPD. Mild scarring noted at right lung base as well as several old right rib fracture deformities. No evidence of pulmonary infiltrate, edema, or pleural effusion. IMPRESSION: COPD.  No active cardiopulmonary disease. Electronically Signed   By: Myles Rosenthal M.D.   On: 04/12/2018 19:16   Dg Abd 1 View  Result Date: 05/03/2018 CLINICAL DATA:  Small bowel obstruction EXAM: ABDOMEN - 1 VIEW COMPARISON:  05/02/2018 FINDINGS: NG tube is in the stomach. Gaseous distention of the stomach and left abdominal small bowel loops. Gas and stool within nondistended colon. No free air or organomegaly. IMPRESSION: Gaseous distention of stomach and left abdominal small bowel loops likely reflecting small bowel  obstruction. NG tube is in the stomach. Electronically Signed   By: Charlett Nose M.D.   On: 05/03/2018 10:05   Dg Abd 1 View  Result Date: 05/02/2018 CLINICAL DATA:  Small bowel obstruction EXAM: ABDOMEN - 1 VIEW COMPARISON:  May 01, 2018 abdominal radiograph and CT abdomen and pelvis April 30, 2018 FINDINGS: Nasogastric tube no longer present. There is slightly less bowel dilatation in the left upper quadrant compared to 1 day prior. There is moderate air in the stomach. No bowel dilatation elsewhere. No air-fluid levels. No free air. There is moderate stool in the colon. There is atelectatic change in the right lung base. IMPRESSION: Nasogastric tube no longer appreciable. Less small bowel dilatation in the left upper quadrant. Question resolving bowel obstruction. No free air evident. Electronically Signed   By: Bretta Bang III M.D.   On: 05/02/2018 14:20   Ct Head Wo Contrast  Result Date: 04/29/2018 CLINICAL DATA:  Altered level of consciousness increased over last 2 days, 2 episodes of nausea and vomiting, history of prior brain injury, dementia, former smoker, hypertension EXAM: CT HEAD WITHOUT CONTRAST TECHNIQUE: Contiguous axial images were obtained from the base of the skull through the vertex without intravenous contrast. COMPARISON:  06/08/2010 FINDINGS: Brain: Generalized atrophy. Normal ventricular morphology. No midline shift or mass effect. Small vessel chronic ischemic changes of deep cerebral white matter. 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 No intracranial hemorrhage, mass lesion, evidence of acute infarction, or extra-axial fluid collection. Vascular: Minimal atherosclerotic calcification of internal carotid arteries at skull base Skull: Intact Sinuses/Orbits: Clear Other: N/A IMPRESSION: Atrophy with small vessel chronic ischemic changes of deep cerebral white matter. 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 No acute intracranial abnormalities.  Electronically Signed   By: Ulyses Southward M.D.   On: 04/29/2018 09:58   Dg Chest Port 1 View  Result Date: 05/09/2018 CLINICAL DATA:  Cough EXAM: PORTABLE CHEST 1 VIEW COMPARISON:  05/04/2018 FINDINGS: NG tube is stable. Right upper extremity PICC placed. Tip is at the cavoatrial junction. Lungs remain hyperaerated and clear. Small right pleural effusion is stable. No pneumothorax. Aorta remains somewhat prominent due to tortuosity and rotation of the thorax. IMPRESSION: New right upper extremity PICC with its tip at the cavoatrial junction. No active cardiopulmonary disease. Electronically Signed   By: Jolaine Click M.D.   On: 05/09/2018 07:37   Dg Chest Port 1 View  Result Date: 04/29/2018 CLINICAL DATA:  Altered mental status EXAM: PORTABLE CHEST 1 VIEW COMPARISON:  April 12, 2018 FINDINGS: There is mild scarring in the lateral right base and in the medial right lower lung region. There is no edema or consolidation. The heart size and pulmonary vascularity are normal. No adenopathy. There is a recent appearing fracture of the posterior right seventh rib with alignment essentially anatomic. An older fracture of the posterior right eighth rib is noted. No pneumothorax. IMPRESSION: Recent appearing fracture posterior right seventh rib. Older fracture posterior right eighth rib noted. Areas of mild scarring noted on the right. No edema or consolidation. Stable cardiac silhouette. Electronically Signed   By: Bretta Bang III M.D.   On: 04/29/2018 08:46   Dg Chest Port 1v Same Day  Result Date: 05/01/2018 CLINICAL DATA:  history of schizophrenia, hypertension, BPH-mostly wheelchair-bound-presented to the hospital with approximately 1 week history of intermittent vomiting, confusion for a few days prior to this hospital admission-presented with sepsis secondary to UTI, and significant constipation. C/o SOB EXAM: PORTABLE CHEST - 1 VIEW SAME DAY COMPARISON:  04/29/2018 FINDINGS: Lungs are  hyperinflated. Subsegmental atelectasis or early infiltrate at the left lung base, new since previous. Right lung clear. Heart size normal.  Tortuous ectatic thoracic aorta. No effusion. No pneumothorax. Right anterolateral fourth rib fracture, age indeterminate. Nasogastric tube extends to the stomach. IMPRESSION: 1. New patchy infiltrate or subsegmental atelectasis at the left lung base. 2. Right fourth rib fracture without pneumothorax. 3. Nasogastric tube placement to the stomach. Electronically Signed   By: Corlis Leak M.D.   On: 05/01/2018 08:24   Dg Abd 2 Views  Result Date: 05/05/2018 CLINICAL DATA:  Vomiting.  History of right lumpectomy. EXAM: ABDOMEN - 2 VIEW COMPARISON:  05/04/2018 FINDINGS: Improve dilation of small bowel loops with residual borderline gas-filled distended small bowel loops in the left upper quadrant of the abdomen. Normal colonic bowel gas pattern. No evidence of free intra-abdominal gas. Normal cardiac silhouette. Tortuosity of the thoracic aorta. Right pleural reflection versus small pleural effusion. No lobar airspace consolidation. Enteric catheter tip within the expected location the gastric body, side hole at the GE junction level. Right PICC line terminates in the expected location of the cavoatrial junction. IMPRESSION: Improved appearance of small-bowel obstruction. Enteric catheter with side hole at the level of the GE junction. Advancement may be considered. Possible small right pleural effusion. Electronically Signed   By: Ted Mcalpine M.D.   On: 05/05/2018 12:51   Dg Abd Acute W/chest  Result Date: 05/04/2018 CLINICAL DATA:  Follow-up small bowel obstruction. EXAM: DG ABDOMEN ACUTE W/ 1V CHEST COMPARISON:  Abdominal x-rays 05/04/1999 19 dating back to 04/29/2018. CT abdomen and pelvis 04/30/2018. Chest x-rays 05/01/2018, 04/12/2018. FINDINGS:  Nasogastric tube tip in the fundus of the stomach. Persistent marked gaseous distension of the jejunum in the LEFT  UPPER QUADRANT, slowly progressive over the past 3 days, with air-fluid levels on the LATERAL decubitus image. No evidence of free intraperitoneal air. Normal caliber colon with moderate stool burden. Calcified uterine fibroid in the pelvic midline. Cardiac silhouette normal in size, unchanged. Thoracic aorta tortuous and atherosclerotic. Hilar and mediastinal contours otherwise unremarkable. Interval improvement in aeration in the LEFT lung base, with only minimal patchy opacities persisting. Lungs otherwise clear. Emphysematous changes in both lungs with scarring in the RIGHT mid lung, unchanged. Pleuroparenchymal scarring at the RIGHT lung base which accounts for the blunting of the costophrenic angle, unchanged dating back to the original 04/12/2018 examination. IMPRESSION: 1. Persistent partial small bowel obstruction which has slowly worsened over the past 3 days. 2. No evidence of free intraperitoneal air. 3. Improved aeration in the LEFT LOWER LOBE, with only mild atelectasis and/or pneumonia persisting. Electronically Signed   By: Hulan Saas M.D.   On: 05/04/2018 09:28   Dg Abd Portable 1v  Result Date: 05/09/2018 CLINICAL DATA:  Small-bowel obstruction EXAM: PORTABLE ABDOMEN - 1 VIEW COMPARISON:  05/08/2018; 05/06/2018; 05/05/2018 FINDINGS: Re-demonstrated gaseous distention of several loops of small bowel with index loop of small bowel within the left mid hemiabdomen measuring 3.6 cm in diameter. Moderate colonic stool burden. No supine evidence of pneumoperitoneum. No pneumatosis or portal venous gas. Presumed calcified fibroid overlies the left hemipelvis. Stable position of support apparatus. No acute osseus abnormalities. IMPRESSION: No change to slight improvement in suspected small-bowel obstruction. Electronically Signed   By: Simonne Come M.D.   On: 05/09/2018 07:47   Dg Abd Portable 1v  Result Date: 05/08/2018 CLINICAL DATA:  Follow up small bowel obstruction EXAM: PORTABLE ABDOMEN  - 1 VIEW COMPARISON:  05/06/2018 FINDINGS: Scattered large and small bowel gas is noted. Multiple dilated loops of small bowel are again identified and stable in appearance. Nasogastric catheter is again noted within the stomach. No free air is seen. Degenerative changes of the lumbar spine are noted. IMPRESSION: Persistent small bowel dilatation. No new focal abnormality is noted. Electronically Signed   By: Alcide Clever M.D.   On: 05/08/2018 08:36   Dg Abd Portable 1v  Result Date: 05/06/2018 CLINICAL DATA:  Small bowel obstruction EXAM: PORTABLE ABDOMEN - 1 VIEW COMPARISON:  Portable exam at 0628 hours compared to 05/05/2018 FINDINGS: Stool scattered throughout colon. Persistent dilatation of small bowel loops in the LEFT mid abdomen up to 4.9 cm diameter. No bowel wall thickening. Bones demineralized. No definite renal calcifications. IMPRESSION: Persistent small bowel dilatation consistent with small bowel obstruction. Little interval change. Electronically Signed   By: Ulyses Southward M.D.   On: 05/06/2018 10:21   Dg Abd Portable 1v-small Bowel Obstruction Protocol-initial, 8 Hr Delay  Result Date: 05/04/2018 CLINICAL DATA:  Small bowel obstruction protocol. 8 hour delay. EXAM: PORTABLE ABDOMEN - 1 VIEW COMPARISON:  05/03/2018 FINDINGS: Gaseous distention of left upper quadrant small bowel. Enteric tube with tip projected over the mid abdomen consistent with location in the upper stomach. Contrast material is demonstrated in the dilated small bowel. No contrast material is identified in the colon. This is suggestive of high-grade obstruction. Degenerative changes in the spine. IMPRESSION: Contrast material is demonstrated in the dilated small bowel but not in the colon consistent with high-grade obstruction. Electronically Signed   By: Burman Nieves M.D.   On: 05/04/2018 01:55   Dg Abd Portable  1v-small Bowel Obstruction Protocol-initial, 8 Hr Delay  Result Date: 05/01/2018 CLINICAL DATA:  Small  bowel obstruction. EXAM: PORTABLE ABDOMEN - 1 VIEW COMPARISON:  Radiograph earlier this day at 1109 hour, CT yesterday. FINDINGS: Enteric tube in place with tip in the stomach, side-port just beyond the gastroesophageal junction. Improving small bowel dilatation in the left abdomen. Moderate stool in the right and transverse colon. No evidence of free air. Pelvic calcification may be stone in the urinary bladder or exophytic prostate calcification. IMPRESSION: Improving small bowel dilatation in the left abdomen since radiograph earlier this day. Electronically Signed   By: Narda Rutherford M.D.   On: 05/01/2018 21:14   Dg Abd Portable 1v-small Bowel Protocol-position Verification  Result Date: 05/01/2018 CLINICAL DATA:  Nasogastric tube placement EXAM: PORTABLE ABDOMEN - 1 VIEW COMPARISON:  Portable exam 1109 hours compared to 04/30/2018 FINDINGS: Tip of nasogastric tube projects over mid stomach. Dilated small bowel loops are seen in the LEFT mid abdomen. Increased stool in RIGHT colon. Lung bases appear emphysematous with subsegmental atelectasis at LEFT base. Bones demineralized. IMPRESSION: Tip of nasogastric tube projects over mid stomach. Small bowel dilatation in LEFT mid abdomen with increased stool in RIGHT colon. Electronically Signed   By: Ulyses Southward M.D.   On: 05/01/2018 11:22   Dg Abd Portable 1v  Result Date: 04/30/2018 CLINICAL DATA:  67 year old male with vomiting. EXAM: PORTABLE ABDOMEN - 1 VIEW COMPARISON:  04/29/2018 abdominal radiographs. FINDINGS: Portable AP semi upright and supine views at 0847 hours. Enteric tube has been placed and the stomach now appears decompressed. NG tube side hole at the gastric body. Non obstructed bowel gas pattern. Moderate volume of retained stool redemonstrated in the transverse colon and distal sigmoid/rectum. Negative visible lung bases. No pneumoperitoneum. Stable abdominal and pelvic visceral contours. Dystrophic calcification in the pelvis. Stable  pelvic catheter, likely a Foley. No acute osseous abnormality identified. IMPRESSION: 1. NG tube in place with resolved gastric distention. 2. Non obstructed bowel gas pattern with moderate volume of retained stool. Electronically Signed   By: Odessa Fleming M.D.   On: 04/30/2018 09:06   Dg Abd Portable 1v  Result Date: 04/29/2018 CLINICAL DATA:  Nausea and vomiting EXAM: PORTABLE ABDOMEN - 1 VIEW COMPARISON:  None. FINDINGS: Gaseous distention of the stomach. Formed stool throughout the colon. No concerning mass effect or calcification. IMPRESSION: Prominent gaseous distension of the stomach. Constipated appearance. Electronically Signed   By: Marnee Spring M.D.   On: 04/29/2018 14:44   [place] Ekg Site Rite  Result Date: 05/04/2018 If Site Rite image not attached, placement could not be confirmed due to current cardiac rhythm.    LOS: 10 days   Signature  Susa Raring M.D on 05/09/2018 at 8:49 AM  To page go to [email or web address] - password [facility]

## 2018-05-09 NOTE — Progress Notes (Signed)
Nutrition Follow-up  DOCUMENTATION CODES:   Severe malnutrition in context of acute illness/injury, Underweight  INTERVENTION:   - TPN per pharmacy  - Recommend obtaining new weight as last weight recorded on 10/4 and appears to be stated rather than measured  NUTRITION DIAGNOSIS:   Severe Malnutrition related to acute illness(SBO) as evidenced by moderate muscle depletion, severe muscle depletion, moderate fat depletion, severe fat depletion, percent weight loss.  Ongoing  GOAL:   Patient will meet greater than or equal to 90% of their needs  Met via TPN  MONITOR:   Diet advancement, Labs, Weight trends, Skin, I & O's  REASON FOR ASSESSMENT:   Consult New TPN/TNA  ASSESSMENT:    Jeffrey Davila  is a 67 y.o. male, past medical history of schizophrenia, hypertension, BPH, who lives with his sister at home, for last 16 years, he is wheelchair dependent at baseline, but able to transfer from bed to chair, communicative and appropriate per sister, she reports over the last few days patient has been less interactive, sleeping more often during the day, and over the last 2 days almost with no significant oral intake, which prompted her to come to ED.  10/4- NGT placed 10/8- NGT removed, NGT replaced 10/10 - TPN started  Discussed pt with RN who reports pt still having a lot of secretions. Per RN and MD notes, PNA improving. Pt remains at high aspiration risk.  Noted pt's sister has agreed to surgery tomorrow. Plan is for ex lap, LOA, possible bowel resection.  Pt resting in bed at time of visit. Spoke with pt's sister at bedside. Pt's sister concerned that it is almost time for a new bag of TPN. RD explained that new bag will start at 1800 per MAR. Pt's sister expressed understanding.  RNs in room providing nursing care at time of visit.  Current TPN: 65 ml/hr to provide 1663 kcal, 94 grams of protein, 218 grams of dextrose, and 55 grams of lipids, meeting 100% of pt's  estimated needs.  Medications reviewed and include: Lasix 20 mg once, Zyprexa 10 mg BID, TPN, IV antibiotics  Labs reviewed: hemoglobin 7.4 (L), HCT 24.3 (L) CBG's: 89, 82, 90, 93, 102 x 24 hours Electrolytes WNL.  UOP: 725 ml x 24 hours NGT output: 1100 ml x 24 hours I/O's: -737 ml since admit  Diet Order:   Diet Order            Diet NPO time specified  Diet effective now              EDUCATION NEEDS:   Education needs have been addressed  Skin:  Skin Assessment: Reviewed RN Assessment  Last BM:  10/9  Height:   Ht Readings from Last 1 Encounters:  04/29/18 '5\' 6"'  (1.676 m)    Weight:   Wt Readings from Last 1 Encounters:  04/29/18 49.9 kg    Ideal Body Weight:  64.5 kg  BMI:  Body mass index is 17.75 kg/m.  Estimated Nutritional Needs:   Kcal:  1550-1750  Protein:  85-100 grams  Fluid:  >1.5 L    Gaynell Face, MS, RD, LDN Inpatient Clinical Dietitian Pager: [phone] Weekend/After Hours: [phone]

## 2018-05-09 NOTE — Progress Notes (Signed)
Physical Therapy Treatment Patient Details Name: DELDRICK Davila MRN: [id number] DOB: Sep 02, 1950 Today's Date: 05/09/2018    History of Present Illness Pt is a 67 y/o male admitted secondary to AMS and Sepsis most likely secondary to UTI. CT negative for acute abnormality. PMH inlcudes BI, dementia, HTN, and schizophrenia.     PT Comments    Pt performed mobility out of bed with attempted progression to gt training.  Pt able to take two steps forward before sitting impulsively and requiring increased assistance to return to bed and back to supine position.  Pt based on function may require SNF placement as he is not functioning as well as he has during previous tx.  Will have PT follow up  Next session to determine d/c disposition after surgery.     Follow Up Recommendations  Home health PT;Supervision/Assistance - 24 hour     Equipment Recommendations  Rolling walker with 5" wheels;Hospital bed    Recommendations for Other Services       Precautions / Restrictions Precautions Precautions: Fall Precaution Comments: NG tube Restrictions Weight Bearing Restrictions: No    Mobility  Bed Mobility Overal bed mobility: Needs Assistance Bed Mobility: Supine to Sit;Sit to Supine     Supine to sit: Mod assist;+2 for physical assistance Sit to supine: Max assist;+2 for physical assistance   General bed mobility comments: Pt required increased assistance.  Pt required mod assistance to elevate into seated position.  PTA used bed pad to advance hip to edge of bed.  Pt with productive cough in steated position.  When returning to bed he required max assistance +2 as he impulsively attempted to sit when standing and PTA and tech assisted back to bed and back to supine.      Transfers Overall transfer level: Needs assistance Equipment used: None Transfers: Sit to/from Stand Sit to Stand: Mod assist;+2 physical assistance Stand pivot transfers: Max assist;+2 physical assistance        General transfer comment: Pt able to stand with flexed posture but then began reaching for device ( IV pole).  attempted to progress to steps holding IV pole, patient took two steps toward foot of bed before impulsively sitting and required increased assistance to pivot and return to recliner.    Ambulation/Gait                 Stairs             Wheelchair Mobility    Modified Rankin (Stroke Patients Only)       Balance Overall balance assessment: Needs assistance   Sitting balance-Leahy Scale: Good       Standing balance-Leahy Scale: Fair                              Cognition Arousal/Alertness: Lethargic Behavior During Therapy: Flat affect;Impulsive;Agitated(changes through out.  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  )    Frequency    Min 3X/week      PT Plan Current plan remains appropriate    Co-evaluation              AM-PAC PT "6 Clicks" Daily Activity  Outcome Measure  Difficulty turning over in bed (including adjusting bedclothes, sheets and blankets)?: Unable Difficulty moving from lying on back to sitting on the side of the bed? : Unable Difficulty sitting down on and standing up from a chair with arms (e.g., wheelchair, bedside commode, etc,.)?: Unable Help needed moving to and from a bed to chair  (including a wheelchair)?: A Lot Help needed walking in hospital room?: A Lot Help needed climbing 3-5 steps with a railing? : Total 6 Click Score: 8    End of Session Equipment Utilized During Treatment: Gait belt Activity Tolerance: Patient tolerated treatment well Patient left: in chair;with call bell/phone within reach;with family/visitor present;with nursing/sitter in room Nurse Communication: Mobility status PT Visit Diagnosis: Unsteadiness on feet (R26.81);Muscle weakness (generalized) (M62.81);Difficulty in walking, not elsewhere classified (R26.2)     Time: 4098-1191 PT Time Calculation (min) (ACUTE ONLY): 19 min  Charges:  $Therapeutic Activity: 8-22 mins                     Joycelyn Rua, PTA Acute Rehabilitation Services Pager [phone] Office [phone]     Jeffrey Davila Delay 05/09/2018, 5:08 PM

## 2018-05-09 NOTE — Plan of Care (Signed)
  Problem: Education: Goal: Knowledge of General Education information will improve Description Including pain rating scale, medication(s)/side effects and non-pharmacologic comfort measures Outcome: Progressing   Problem: Clinical Measurements: Goal: Ability to maintain clinical measurements within normal limits will improve Outcome: Progressing Goal: Will remain free from infection Outcome: Progressing Goal: Cardiovascular complication will be avoided Outcome: Progressing   Problem: Coping: Goal: Level of anxiety will decrease Outcome: Completed/Met   Problem: Elimination: Goal: Will not experience complications related to bowel motility Outcome: Progressing  Condom cath on pt Problem: Pain Managment: Goal: General experience of comfort will improve Outcome: Progressing   Problem: Safety: Goal: Ability to remain free from injury will improve Outcome: Progressing

## 2018-05-10 ENCOUNTER — Encounter (HOSPITAL_COMMUNITY): Payer: Self-pay

## 2018-05-10 ENCOUNTER — Encounter (HOSPITAL_COMMUNITY)
Admission: EM | Disposition: A | Discharge status: Skilled Nursing Facility | Payer: Self-pay | Source: Home / Self Care | Attending: Internal Medicine

## 2018-05-10 ENCOUNTER — Inpatient Hospital Stay (HOSPITAL_COMMUNITY): Payer: Medicare Other | Admitting: Anesthesiology

## 2018-05-10 HISTORY — PX: LAPAROSCOPIC LYSIS OF ADHESIONS: SHX5905

## 2018-05-10 HISTORY — PX: LAPAROSCOPY: SHX197

## 2018-05-10 HISTORY — PX: BOWEL RESECTION: SHX1257

## 2018-05-10 LAB — GLUCOSE, CAPILLARY
GLUCOSE-CAPILLARY: 103 mg/dL — AB (ref 70–99)
GLUCOSE-CAPILLARY: 88 mg/dL (ref 70–99)
Glucose-Capillary: 104 mg/dL — ABNORMAL HIGH (ref 70–99)
Glucose-Capillary: 103 mg/dL — ABNORMAL HIGH (ref 70–99)
Glucose-Capillary: 102 mg/dL — ABNORMAL HIGH (ref 70–99)

## 2018-05-10 LAB — POCT I-STAT 7, (LYTES, BLD GAS, ICA,H+H)
ACID-BASE DEFICIT: 1 mmol/L (ref 0.0–2.0)
BICARBONATE: 26.5 mmol/L (ref 20.0–28.0)
Calcium, Ion: 1.29 mmol/L (ref 1.15–1.40)
HEMATOCRIT: 25 % — AB (ref 39.0–52.0)
Hemoglobin: 8.5 g/dL — ABNORMAL LOW (ref 13.0–17.0)
O2 Saturation: 100 %
PH ART: 7.257 — AB (ref 7.350–7.450)
POTASSIUM: 4.3 mmol/L (ref 3.5–5.1)
Sodium: 138 mmol/L (ref 135–145)
TCO2: 28 mmol/L (ref 22–32)
pCO2 arterial: 59.4 mmHg — ABNORMAL HIGH (ref 32.0–48.0)
pO2, Arterial: 294 mmHg — ABNORMAL HIGH (ref 83.0–108.0)

## 2018-05-10 LAB — BASIC METABOLIC PANEL
Anion gap: 8 (ref 5–15)
BUN: 26 mg/dL — ABNORMAL HIGH (ref 8–23)
CALCIUM: 9.1 mg/dL (ref 8.9–10.3)
CO2: 26 mmol/L (ref 22–32)
CREATININE: 0.7 mg/dL (ref 0.61–1.24)
Chloride: 104 mmol/L (ref 98–111)
GFR calc Af Amer: >60 mL/min (ref >60)
GFR calc non Af Amer: >60 mL/min (ref >60)
GLUCOSE: 104 mg/dL — AB (ref 70–99)
Potassium: 4.4 mmol/L (ref 3.5–5.1)
Sodium: 138 mmol/L (ref 135–145)

## 2018-05-10 LAB — SURGICAL PCR SCREEN
MRSA, PCR: NEGATIVE
Staphylococcus aureus: NEGATIVE

## 2018-05-10 SURGERY — LAPAROSCOPY, DIAGNOSTIC
Anesthesia: General | Site: Abdomen

## 2018-05-10 MED ORDER — CEFAZOLIN SODIUM-DEXTROSE 2-4 GM/100ML-% IV SOLN
INTRAVENOUS | Status: AC
  Filled 2018-05-10: qty 100

## 2018-05-10 MED ORDER — CEFAZOLIN SODIUM-DEXTROSE 2-3 GM-%(50ML) IV SOLR
INTRAVENOUS | Status: DC | PRN
  Administered 2018-05-10: 2 g via INTRAVENOUS

## 2018-05-10 MED ORDER — DEXAMETHASONE SODIUM PHOSPHATE 10 MG/ML IJ SOLN
INTRAMUSCULAR | Status: DC | PRN
  Administered 2018-05-10: 5 mg via INTRAVENOUS

## 2018-05-10 MED ORDER — ROCURONIUM BROMIDE 100 MG/10ML IV SOLN
INTRAVENOUS | Status: DC | PRN
  Administered 2018-05-10: 20 mg via INTRAVENOUS
  Administered 2018-05-10: 50 mg via INTRAVENOUS

## 2018-05-10 MED ORDER — GLYCOPYRROLATE 0.2 MG/ML IJ SOLN
INTRAMUSCULAR | Status: DC | PRN
  Administered 2018-05-10: 0.1 mg via INTRAVENOUS

## 2018-05-10 MED ORDER — ONDANSETRON HCL 4 MG/2ML IJ SOLN: 4.0000 mg | Freq: Once | INTRAMUSCULAR | Status: DC | PRN

## 2018-05-10 MED ORDER — ALBUMIN HUMAN 5 % IV SOLN
12.5000 g | Freq: Once | INTRAVENOUS | Status: AC
  Administered 2018-05-10: 12.5 g via INTRAVENOUS

## 2018-05-10 MED ORDER — MIDAZOLAM HCL 2 MG/2ML IJ SOLN
INTRAMUSCULAR | Status: AC
  Filled 2018-05-10: qty 2

## 2018-05-10 MED ORDER — DEXAMETHASONE SODIUM PHOSPHATE 10 MG/ML IJ SOLN
INTRAMUSCULAR | Status: AC
  Filled 2018-05-10: qty 1

## 2018-05-10 MED ORDER — ROCURONIUM BROMIDE 50 MG/5ML IV SOSY
PREFILLED_SYRINGE | INTRAVENOUS | Status: AC
  Filled 2018-05-10: qty 5

## 2018-05-10 MED ORDER — ONDANSETRON HCL 4 MG/2ML IJ SOLN
INTRAMUSCULAR | Status: DC | PRN
  Administered 2018-05-10: 4 mg via INTRAVENOUS

## 2018-05-10 MED ORDER — EPHEDRINE SULFATE 50 MG/ML IJ SOLN
INTRAMUSCULAR | Status: DC | PRN
  Administered 2018-05-10: 5 mg via INTRAVENOUS

## 2018-05-10 MED ORDER — LACTATED RINGERS IV SOLN
INTRAVENOUS | Status: DC
  Administered 2018-05-10 – 2018-05-31 (×3): via INTRAVENOUS

## 2018-05-10 MED ORDER — 0.9 % SODIUM CHLORIDE (POUR BTL) OPTIME
TOPICAL | Status: DC | PRN
  Administered 2018-05-10 (×2): 1000 mL

## 2018-05-10 MED ORDER — ONDANSETRON HCL 4 MG/2ML IJ SOLN
INTRAMUSCULAR | Status: AC
  Filled 2018-05-10: qty 2

## 2018-05-10 MED ORDER — ALBUMIN HUMAN 5 % IV SOLN
INTRAVENOUS | Status: DC | PRN
  Administered 2018-05-10 (×3): via INTRAVENOUS

## 2018-05-10 MED ORDER — BUPIVACAINE HCL (PF) 0.25 % IJ SOLN
INTRAMUSCULAR | Status: AC
  Filled 2018-05-10: qty 30

## 2018-05-10 MED ORDER — FENTANYL CITRATE (PF) 100 MCG/2ML IJ SOLN
25.0000 ug | INTRAMUSCULAR | Status: DC | PRN
  Administered 2018-05-10 (×2): 25 ug via INTRAVENOUS

## 2018-05-10 MED ORDER — FENTANYL CITRATE (PF) 100 MCG/2ML IJ SOLN
INTRAMUSCULAR | Status: DC | PRN
  Administered 2018-05-10 (×3): 50 ug via INTRAVENOUS

## 2018-05-10 MED ORDER — FENTANYL CITRATE (PF) 250 MCG/5ML IJ SOLN
INTRAMUSCULAR | Status: AC
  Filled 2018-05-10: qty 5

## 2018-05-10 MED ORDER — SODIUM CHLORIDE 0.9 % IV SOLN
INTRAVENOUS | Status: DC | PRN
  Administered 2018-05-10: 80 ug/min via INTRAVENOUS

## 2018-05-10 MED ORDER — SUGAMMADEX SODIUM 200 MG/2ML IV SOLN
INTRAVENOUS | Status: DC | PRN
  Administered 2018-05-10: 200 mg via INTRAVENOUS

## 2018-05-10 MED ORDER — ACETAMINOPHEN 10 MG/ML IV SOLN
1000.0000 mg | Freq: Four times a day (QID) | INTRAVENOUS | Status: AC
  Administered 2018-05-10 – 2018-05-11 (×4): 1000 mg via INTRAVENOUS
  Filled 2018-05-10 (×6): qty 100

## 2018-05-10 MED ORDER — FENTANYL CITRATE (PF) 100 MCG/2ML IJ SOLN
INTRAMUSCULAR | Status: AC
  Administered 2018-05-10: 25 ug via INTRAVENOUS
  Filled 2018-05-10: qty 2

## 2018-05-10 MED ORDER — LIDOCAINE 2% (20 MG/ML) 5 ML SYRINGE
INTRAMUSCULAR | Status: DC | PRN
  Administered 2018-05-10: 60 mg via INTRAVENOUS

## 2018-05-10 MED ORDER — TRAVASOL 10 % IV SOLN
INTRAVENOUS | Status: AC
  Administered 2018-05-10: 18:00:00 via INTRAVENOUS
  Filled 2018-05-10: qty 936

## 2018-05-10 MED ORDER — ALBUMIN HUMAN 5 % IV SOLN
INTRAVENOUS | Status: AC
  Administered 2018-05-10: 12.5 g via INTRAVENOUS
  Filled 2018-05-10: qty 250

## 2018-05-10 MED ORDER — HEPARIN SODIUM (PORCINE) 5000 UNIT/ML IJ SOLN
5000.0000 [IU] | Freq: Three times a day (TID) | INTRAMUSCULAR | Status: DC
  Administered 2018-05-11 – 2018-06-03 (×66): 5000 [IU] via SUBCUTANEOUS
  Filled 2018-05-10 (×67): qty 1

## 2018-05-10 MED ORDER — VASOPRESSIN 20 UNIT/ML IV SOLN
INTRAVENOUS | Status: DC | PRN
  Administered 2018-05-10: 2 [IU] via INTRAVENOUS
  Administered 2018-05-10 (×2): 1 [IU] via INTRAVENOUS
  Administered 2018-05-10: 2 [IU] via INTRAVENOUS

## 2018-05-10 MED ORDER — PROPOFOL 10 MG/ML IV BOLUS
INTRAVENOUS | Status: DC | PRN
  Administered 2018-05-10: 100 mg via INTRAVENOUS

## 2018-05-10 MED ORDER — SODIUM CHLORIDE 0.9 % IV BOLUS
500.0000 mL | Freq: Once | INTRAVENOUS | Status: AC
  Administered 2018-05-10: 500 mL via INTRAVENOUS

## 2018-05-10 MED ORDER — BUPIVACAINE HCL 0.25 % IJ SOLN
INTRAMUSCULAR | Status: DC | PRN
  Administered 2018-05-10: 6 mL

## 2018-05-10 MED ORDER — VASOPRESSIN 20 UNIT/ML IV SOLN
INTRAVENOUS | Status: AC
  Filled 2018-05-10: qty 1

## 2018-05-10 MED ORDER — MORPHINE SULFATE (PF) 2 MG/ML IV SOLN
2.0000 mg | INTRAVENOUS | Status: DC | PRN
  Administered 2018-05-10: 4 mg via INTRAVENOUS
  Administered 2018-05-10 – 2018-05-11 (×6): 2 mg via INTRAVENOUS
  Administered 2018-05-11: 4 mg via INTRAVENOUS
  Administered 2018-05-11: 2 mg via INTRAVENOUS
  Administered 2018-05-12: 4 mg via INTRAVENOUS
  Administered 2018-05-12 (×3): 2 mg via INTRAVENOUS
  Filled 2018-05-10 (×2): qty 1
  Filled 2018-05-10: qty 2
  Filled 2018-05-10 (×3): qty 1
  Filled 2018-05-10 (×2): qty 2
  Filled 2018-05-10 (×6): qty 1

## 2018-05-10 MED ORDER — PHENYLEPHRINE 40 MCG/ML (10ML) SYRINGE FOR IV PUSH (FOR BLOOD PRESSURE SUPPORT)
PREFILLED_SYRINGE | INTRAVENOUS | Status: DC | PRN
  Administered 2018-05-10 (×2): 80 ug via INTRAVENOUS

## 2018-05-10 SURGICAL SUPPLY — 60 items
APPLIER CLIP 5 13 M/L LIGAMAX5 (MISCELLANEOUS)
BENZOIN TINCTURE PRP APPL 2/3 (GAUZE/BANDAGES/DRESSINGS) IMPLANT
BLADE CLIPPER SURG (BLADE) ×4 IMPLANT
CANISTER SUCT 3000ML PPV (MISCELLANEOUS) ×4 IMPLANT
CHLORAPREP W/TINT 26ML (MISCELLANEOUS) ×4 IMPLANT
CLIP APPLIE 5 13 M/L LIGAMAX5 (MISCELLANEOUS) IMPLANT
COVER SURGICAL LIGHT HANDLE (MISCELLANEOUS) ×4 IMPLANT
COVER WAND RF STERILE (DRAPES) IMPLANT
DEFOGGER SCOPE WARMER CLEARIFY (MISCELLANEOUS) IMPLANT
DRAPE LAPAROSCOPIC ABDOMINAL (DRAPES) ×4 IMPLANT
DRAPE WARM FLUID 44X44 (DRAPE) ×4 IMPLANT
DRSG OPSITE POSTOP 4X10 (GAUZE/BANDAGES/DRESSINGS) IMPLANT
DRSG OPSITE POSTOP 4X6 (GAUZE/BANDAGES/DRESSINGS) ×4 IMPLANT
DRSG OPSITE POSTOP 4X8 (GAUZE/BANDAGES/DRESSINGS) IMPLANT
DRSG TEGADERM 2-3/8X2-3/4 SM (GAUZE/BANDAGES/DRESSINGS) ×4 IMPLANT
ELECT BLADE 6.5 EXT (BLADE) IMPLANT
ELECT CAUTERY BLADE 6.4 (BLADE) IMPLANT
ELECT REM PT RETURN 9FT ADLT (ELECTROSURGICAL) ×4
ELECTRODE REM PT RTRN 9FT ADLT (ELECTROSURGICAL) ×2 IMPLANT
GAUZE SPONGE 2X2 8PLY STRL LF (GAUZE/BANDAGES/DRESSINGS) ×2 IMPLANT
GLOVE BIO SURGEON STRL SZ7.5 (GLOVE) ×4 IMPLANT
GLOVE BIOGEL PI IND STRL 8 (GLOVE) ×2 IMPLANT
GLOVE BIOGEL PI INDICATOR 8 (GLOVE) ×2
GOWN STRL REUS W/ TWL LRG LVL3 (GOWN DISPOSABLE) ×4 IMPLANT
GOWN STRL REUS W/ TWL XL LVL3 (GOWN DISPOSABLE) ×2 IMPLANT
GOWN STRL REUS W/TWL LRG LVL3 (GOWN DISPOSABLE) ×4
GOWN STRL REUS W/TWL XL LVL3 (GOWN DISPOSABLE) ×2
KIT BASIN OR (CUSTOM PROCEDURE TRAY) ×4 IMPLANT
KIT TURNOVER KIT B (KITS) ×4 IMPLANT
LIGASURE IMPACT 36 18CM CVD LR (INSTRUMENTS) IMPLANT
NEEDLE INSUFFLATION 14GA 120MM (NEEDLE) ×4 IMPLANT
NS IRRIG 1000ML POUR BTL (IV SOLUTION) ×8 IMPLANT
PACK GENERAL/GYN (CUSTOM PROCEDURE TRAY) ×4 IMPLANT
PAD ARMBOARD 7.5X6 YLW CONV (MISCELLANEOUS) ×8 IMPLANT
RELOAD PROXIMATE 75MM BLUE (ENDOMECHANICALS) ×12 IMPLANT
SCISSORS LAP 5X35 DISP (ENDOMECHANICALS) ×4 IMPLANT
SET IRRIG TUBING LAPAROSCOPIC (IRRIGATION / IRRIGATOR) IMPLANT
SHEARS HARMONIC ACE PLUS 36CM (ENDOMECHANICALS) IMPLANT
SLEEVE ENDOPATH XCEL 5M (ENDOMECHANICALS) ×16 IMPLANT
SPECIMEN JAR LARGE (MISCELLANEOUS) IMPLANT
SPONGE GAUZE 2X2 STER 10/PKG (GAUZE/BANDAGES/DRESSINGS) ×2
SPONGE LAP 18X18 X RAY DECT (DISPOSABLE) IMPLANT
STAPLER PROXIMATE 75MM BLUE (STAPLE) ×4 IMPLANT
STAPLER VISISTAT 35W (STAPLE) IMPLANT
SUCTION POOLE TIP (SUCTIONS) ×4 IMPLANT
SUT MNCRL AB 4-0 PS2 18 (SUTURE) IMPLANT
SUT PDS AB 1 TP1 96 (SUTURE) ×8 IMPLANT
SUT SILK 2 0 SH CR/8 (SUTURE) ×4 IMPLANT
SUT SILK 2 0 TIES 10X30 (SUTURE) ×4 IMPLANT
SUT SILK 3 0 SH CR/8 (SUTURE) ×4 IMPLANT
SUT SILK 3 0 TIES 10X30 (SUTURE) ×4 IMPLANT
TOWEL OR 17X24 6PK STRL BLUE (TOWEL DISPOSABLE) ×4 IMPLANT
TOWEL OR 17X26 10 PK STRL BLUE (TOWEL DISPOSABLE) ×4 IMPLANT
TRAY FOLEY MTR SLVR 16FR STAT (SET/KITS/TRAYS/PACK) ×4 IMPLANT
TRAY LAPAROSCOPIC MC (CUSTOM PROCEDURE TRAY) ×4 IMPLANT
TROCAR XCEL NON-BLD 11X100MML (ENDOMECHANICALS) IMPLANT
TROCAR XCEL NON-BLD 5MMX100MML (ENDOMECHANICALS) ×4 IMPLANT
TUBING INSUFFLATION (TUBING) ×4 IMPLANT
WATER STERILE IRR 1000ML POUR (IV SOLUTION) ×4 IMPLANT
YANKAUER SUCT BULB TIP NO VENT (SUCTIONS) IMPLANT

## 2018-05-10 NOTE — Progress Notes (Signed)
PHARMACY - ADULT TOTAL PARENTERAL NUTRITION CONSULT NOTE   Pharmacy Consult for TPN Indication: Bowel obstruction  Patient Measurements: Height: 5\' 6"  (167.6 cm) Weight: 110 lb (49.9 kg) IBW/kg (Calculated) : 63.8 TPN AdjBW (KG): 49.9 Body mass index is 17.75 kg/m.  Assessment:  54 YOM admitted on 10/4 with sepsis. He was found to have pneumonia and a possible SBO on CT on 10/5. Pt has a history of SBO s/p colectomy in 1993. An NG tube was placed with improving small bowel dilatation but patient had 3-4 episodes of vomiting overnight on 10/7-10/8 and the NG tube had to be replaced due to incorrect placement. Pharmacy now consulted to start TPN due to worsening SBO and concern for adhesions that will require surgery.   Of note, TPN was prepared on 10/9 but not administered since patient refused a PICC line.   GI: KUB on 10/9 shows persistent SBO which has worsened. Surgery recommended however sister is not agreeable currently. NG output . Of note, patient is extremely cachectic. Actual body is significantly below ideal body weight. Albumin 3.5 >> 2.2. Back to OR today for ex lap, LOA and possible small bowel resection.  Endo: No h/o DM. CBGs <150, had low prior to TPN.  Insulin requirements in the past 24 hours: 0 units Lytes: Lytes WNL Renal: SCr stable, UOP good Pulm: RA  Cards: VSS Hepatobil: LFTs wnl, Lipase elevated 10/4, TG wnl, prealbumin up to 17.5 Neuro: benztropine, Zyprexa ODT, IV valproate ID: S/p abx for klebsiella UTI and aspiration PNA. Afebrile, WBC up to 17  TPN Access: double lumen PICC placed 10/10 TPN start date: 05/04/18 >>  Nutritional Goals (per RD recommendation on 10/14): Kcal:  1550-1750 Protein:  85-100 grams Fluid:  >1.5 L  Goal TPN rate is 65 ml/hr (provides 1560 mL / 24 hours)  Current Nutrition:  NPO TPN  Plan:  Continue TPN at 65 mL/hr. This TPN provides 94 g of protein, 218 g of dextrose, and 55 g of lipids which provides 1,663 kCals  per day, meeting 100% of patient needs Electrolytes in TPN: No changes Add MVI, trace elements to TPN Monitor TPN labs F/U surgery   Lysle Pearl, PharmD, BCPS Please see AMION for all pharmacy numbers 05/10/2018 8:44 AM

## 2018-05-10 NOTE — Anesthesia Preprocedure Evaluation (Addendum)
Anesthesia Evaluation  Patient identified by MRN, date of birth, ID bandGeneral Assessment Comment:Non verbal response  Reviewed: Allergy & Precautions, NPO status , Patient's Chart, lab work & pertinent test results  Airway Mallampati: II  TM Distance: >3 FB Neck ROM: Full    Dental  (+) Poor Dentition, Chipped,    Pulmonary neg pulmonary ROS, former smoker,    Pulmonary exam normal breath sounds clear to auscultation       Cardiovascular hypertension, Pt. on home beta blockers Normal cardiovascular exam+ dysrhythmias Atrial Fibrillation  Rhythm:Regular Rate:Normal  ECG: NSR, rate 76   Neuro/Psych PSYCHIATRIC DISORDERS Anxiety Depression Bipolar Disorder Schizophrenia Dementia negative neurological ROS     GI/Hepatic negative GI ROS, Neg liver ROS,   Endo/Other  malnourished  Renal/GU negative Renal ROS     Musculoskeletal negative musculoskeletal ROS (+)   Abdominal   Peds  Hematology  (+) anemia ,   Anesthesia Other Findings small bowel obstruction  Wheelchair bound  Reproductive/Obstetrics                            Anesthesia Physical Anesthesia Plan  ASA: III  Anesthesia Plan: General   Post-op Pain Management:    Induction: Intravenous  PONV Risk Score and Plan: 2 and Ondansetron, Dexamethasone and Treatment may vary due to age or medical condition  Airway Management Planned: Oral ETT  Additional Equipment:   Intra-op Plan:   Post-operative Plan: Extubation in OR  Informed Consent: I have reviewed the patients History and Physical, chart, labs and discussed the procedure including the risks, benefits and alternatives for the proposed anesthesia with the patient or authorized representative who has indicated his/her understanding and acceptance.   Dental advisory given  Plan Discussed with: CRNA  Anesthesia Plan Comments:         Anesthesia Quick Evaluation

## 2018-05-10 NOTE — Anesthesia Procedure Notes (Signed)
Procedure Name: Intubation Date/Time: 05/10/2018 10:03 AM Performed by: Marny Lowenstein, CRNA Pre-anesthesia Checklist: Patient identified, Emergency Drugs available, Suction available and Patient being monitored Patient Re-evaluated:Patient Re-evaluated prior to induction Oxygen Delivery Method: Circle system utilized Preoxygenation: Pre-oxygenation with 100% oxygen Induction Type: IV induction and Rapid sequence Laryngoscope Size: Miller and 2 Grade View: Grade I Tube type: Oral Tube size: 7.0 mm Number of attempts: 1 Airway Equipment and Method: Patient positioned with wedge pillow Placement Confirmation: ETT inserted through vocal cords under direct vision,  positive ETCO2 and breath sounds checked- equal and bilateral Secured at: 22 cm Tube secured with: Tape Dental Injury: Teeth and Oropharynx as per pre-operative assessment

## 2018-05-10 NOTE — Progress Notes (Signed)
PROGRESS NOTE        PATIENT DETAILS Name: Jeffrey Davila Age: 67 y.o. Sex: male Date of Birth: 07-21-1951 Admit Date: 04/29/2018 Admitting Physician Starleen Arms, MD PCP:Uba, Reuel Boom, MD  Brief Narrative: Patient is a 67 y.o. male with history of schizophrenia/?  Cognitive dysfunction (dementia), hypertension, BPH, remote history (in the 1990s) of trauma causing small bowel injury requiring laparotomy-mostly wheelchair-bound-presented to the hospital with approximately 1 week history of intermittent vomiting, confusion for a few days prior to this hospital admission-presented with sepsis secondary to UTI, vomiting and significant constipation.  Patient was started on IV antibiotics and admitted to the hospital service,initial imaging was suggestive of constipation causing gastric distention-NG tube was placed, however upon further evaluation with a CT scan he was found  to have a small bowel obstruction.  General surgery was consulted, even with n.p.o. status/NG tube decompression-he continues to have SBO-patient's sister has refused to sign consent for surgery, and wants to continue with TNA for a few days so that he can "get stronger" before consenting to surgery.  See below for further details  Subjective: Patient in bed in no distress but slightly sleepy this morning, denies any headache chest pain, mild abdominal discomfort, no new weakness.  Assessment/Plan:  Sepsis secondary to complicated UTI: Sepsis pathophysiology has resolved, urine culture positive for Klebsiella-has completed a course of IV Rocephin.  Blood cultures remain negative.    Aspiration pneumonia: Secondary to SBO/vomiting with underlying frailty.  He still occasionally seems to accumulate oral secretions-but he is clinically improved, his lungs were clear today without any transmitted upper airway sounds.  He has completed a course of Rocephin/Flagyl.  Continue to suction as needed,  continue to mobilize out of bed to chair if possible.  If patient is able to participate in incentive spirometry- encourage incentive spirometry.  Clinically stable pneumonia standpoint continue to monitor.  SBO: Probably secondary to adhesions/scar tissue related to remote history of small bowel injury from trauma.  In spite of NG tube decompression/n.p.o. status-continues to have persistent SBO.  Will need surgery however patient's sister not deciding on surgery yet and holding off, clearly told the risk of bowel perforation and ensuing complications including sepsis and death.  She also requested [facility] and [facility] team declined it.  For now on IV TNA, fortunately after much delay sister has now agreed for surgery which will be done on 05/10/2018.  Note patient's sister had requested that surgical outcome be guaranteed to the previous team and she was clearly told that there are no guarantees in medicine.  The risks and benefits were explained.  Patient will be a moderate risk candidate for adverse cardiopulmonary outcome during the perioperative.,  Due to his frail underlying condition , poor nutritional status and his other chronic comorbidities.  Patient's sister also provided a phone number #[phone] for patient's PCP in [facility] Dr. Nyra Market, and I was told that he would like to speak to me, I called the physician on 05/09/2018 and he informed me that he had never requested any conversation and that he had nothing to add in his care.   Acute metabolic encephalopathy: Patient was much more lethargic and confused on his usual baseline on initial presentation-with supportive care-encephalopathy has resolved.  I suspect he is back to his usual baseline.    Anemia: Combination of anemia of  chronic disease, frequent blood draws, iron panel inconclusive, received 1 unit of packed RBC so far this admission and gave him another unit on 05/09/2018 in preparation of surgery on 05/10/2018,  H&H currently stable we will continue to monitor.  Hyperkalemia: Occurred on 10/10-resolved.   Schizophrenia: Stable-continue IV Depakote and ODT olanzapine.  Is n.p.o with active NG tube decompression-unable to restart Cogentin (per pharmacy no other route that this can be given). Note-per pharmacy-hospitalist service is unable to use parenteral olanzapine-this is limited psychiatry and critical care.   Acute kidney injury: Hemodynamically mediated-resolved.   Hypernatremia: Resolved with hypotonic saline.  This was secondary to vomiting and dehydration.  Now on TNA.  Hypertension: Stable-continue to hold antihypertensives.    Severe malnutrition: Once diet has been started-we will start oral supplements-has been started on TNA  Deconditioning/debility: Suspect has significant amount of debility at baseline-he is very cachectic-deconditioning/debility has worsened due to acute illness/SBO.  PT evaluation appreciated.  Attempt to mobilize secretions, attempt to mobilize out of bed to chair.     DVT Prophylaxis: Prophylactic Heparin   Code Status: Full code   Family Communication:  Sister over the phone by me on 05/08/2018, bedside on 05/09/2018.  She also provided phone number for patient's PCP in [facility] Dr. Nyra Market office phone #[phone] and stated that this physician would like to talk to me about patient's care, called the office at 8:30 AM on 05/09/2018 and left my cell phone number with the secretary for him to call me, was not in the office.  He subsequently called me in the afternoon and said he had never requested to speak to me and that he had nothing to add in patient's care at this time.  He had seen the patient last in April 2019.  Disposition Plan: Remain inpatient-not stable for discharge  Antimicrobial agents: Anti-infectives (From admission, onward)   Start     Dose/Rate Route Frequency Ordered Stop   05/01/18 1215  metroNIDAZOLE (FLAGYL) IVPB 500 mg  Status:   Discontinued     500 mg 100 mL/hr over 60 Minutes Intravenous Every 8 hours 05/01/18 1201 05/06/18 1153   04/30/18 1100  cefTRIAXone (ROCEPHIN) 2 g in sodium chloride 0.9 % 100 mL IVPB  Status:  Discontinued     2 g 200 mL/hr over 30 Minutes Intravenous Every 24 hours 04/29/18 1416 05/06/18 1153   04/29/18 1430  cefTRIAXone (ROCEPHIN) 1 g in sodium chloride 0.9 % 100 mL IVPB     1 g 200 mL/hr over 30 Minutes Intravenous Every 24 hours 04/29/18 1416 04/30/18 1429   04/29/18 1130  cefTRIAXone (ROCEPHIN) 1 g in sodium chloride 0.9 % 100 mL IVPB     1 g 200 mL/hr over 30 Minutes Intravenous  Once 04/29/18 1118 04/29/18 1251      Procedures:  Small bowel obstruction surgery due on 05/10/2018   NG Tube  PICC Line   CONSULTS:  CCS  Time spent: 25 minutes minutes-Greater than 50% of this time was spent in counseling, explanation of diagnosis, planning of further management, and coordination of care.  MEDICATIONS: Scheduled Meds: . [MAR Hold] benztropine  1 mg Oral BID  . [MAR Hold] Chlorhexidine Gluconate Cloth  6 each Topical 2 times per day on Tue  . [MAR Hold] heparin  5,000 Units Subcutaneous Q8H  . [MAR Hold] OLANZapine zydis  10 mg Oral BID  . [MAR Hold] polyvinyl alcohol  1 drop Both Eyes TID   Continuous Infusions: . [MAR Hold] sodium chloride  500 mL (05/05/18 1429)  . lactated ringers    . TPN ADULT (ION) 65 mL/hr at 05/10/18 0500  . TPN ADULT (ION)    . [MAR Hold] valproate sodium Stopped (05/10/18 0436)   PRN Meds:.[MAR Hold] sodium chloride, [MAR Hold] acetaminophen, [MAR Hold] albuterol, [MAR Hold] diphenhydrAMINE, [MAR Hold] metoprolol tartrate, [MAR Hold] ondansetron (ZOFRAN) IV, [MAR Hold] sodium chloride flush   PHYSICAL EXAM: Vital signs: Vitals:   05/09/18 1355 05/09/18 1714 05/09/18 2118 05/10/18 0501  BP: 116/80  104/76 115/78  Pulse: 90  (!) 103 100  Resp: 16     Temp: 99.3 F (37.4 C) (!) 97.5 F (36.4 C) 98.4 F (36.9 C) 99.2 F (37.3 C)    TempSrc: Oral Axillary Oral Oral  SpO2: 100%  93% 96%  Weight:      Height:       Filed Weights   04/29/18 0842  Weight: 49.9 kg   Body mass index is 17.75 kg/m.    Exam  Awake Alert, Oriented X 3, No new F.N deficits, answers questions in single words of yes and no, NG in place [place].AT,PERRAL Supple Neck,No JVD, No cervical lymphadenopathy appriciated.  Symmetrical Chest wall movement, Good air movement bilaterally, CTAB RRR,No Gallops, Rubs or new Murmurs, No Parasternal Heave +ve B.Sounds, Abd Soft, No tenderness, No organomegaly appriciated, No rebound - guarding or rigidity. No Cyanosis, Clubbing or edema, No new Rash or bruise   I have personally reviewed following labs and imaging studies  LABORATORY DATA:  CBC:  Recent Labs  Lab 05/05/18 1012 05/06/18 1252 05/07/18 0430 05/08/18 0804 05/09/18 0412 05/09/18 1703  WBC 15.8* 14.2* 13.9* 15.1* 17.0*  --   NEUTROABS 13.8*  --   --   --  13.4*  --   HGB 7.4* 8.4* 7.9* 7.8* 7.4* 10.3*  HCT 24.6* 25.4* 26.0* 23.2* 24.3* 31.2*  MCV 95.7 94.1 94.5 100.9* 95.3  --   PLT 438* 516* 605* 588* 681*  --     Basic Metabolic Panel: Recent Labs  Lab 05/04/18 0826 05/05/18 1012  05/06/18 0355 05/07/18 0430 05/08/18 1050 05/09/18 0412 05/10/18 0326  NA  --  138   < > 142 140 139 139 138  K  --  6.4*   < > 3.4* 3.6 4.1 4.4 4.4  CL  --  105   < > 104 105 110 110 104  CO2  --  27   < > 29 27 25 25 26   GLUCOSE  --  98   < > 106* 103* 98 97 104*  BUN  --  13   < > 10 16 24* 23 26*  CREATININE  --  0.74   < > 0.73 0.69 0.63 0.59* 0.70  CALCIUM  --  8.1*   < > 8.3* 8.6* 8.3* 8.6* 9.1  MG 1.9 1.6*  --  2.3 2.1  --  2.0  --   PHOS  --  1.4*  --  2.9 2.5  --  3.0  --    < > = values in this interval not displayed.    GFR: Estimated Creatinine Clearance: 64.1 mL/min (by C-G formula based on SCr of 0.7 mg/dL).  Liver Function Tests: Recent Labs  Lab 05/05/18 1012 05/09/18 0412  AST 8* 7*  ALT 6 6  ALKPHOS 46 45   BILITOT 0.5 0.2*  PROT 5.0* 5.9*  ALBUMIN 2.2* 2.2*   No results for input(s): LIPASE, AMYLASE in the last 168 hours. No results  for input(s): AMMONIA in the last 168 hours.  Coagulation Profile: Recent Labs  Lab 05/09/18 0412  INR 1.17    Cardiac Enzymes: No results for input(s): CKTOTAL, CKMB, CKMBINDEX, TROPONINI in the last 168 hours.  BNP (last 3 results) No results for input(s): PROBNP in the last 8760 hours.  HbA1C: No results for input(s): HGBA1C in the last 72 hours.  CBG: Recent Labs  Lab 05/09/18 1617 05/09/18 2010 05/10/18 0024 05/10/18 0426 05/10/18 0807  GLUCAP 97 96 103* 104* 103*    Lipid Profile: Recent Labs    05/09/18 0412  TRIG 74    Thyroid Function Tests: No results for input(s): TSH, T4TOTAL, FREET4, T3FREE, THYROIDAB in the last 72 hours.  Anemia Panel: Recent Labs    05/08/18 0804 05/08/18 1140  VITAMINB12 740  --   FOLATE  --  8.6  FERRITIN 58  --   TIBC 157*  --   IRON 11*  --   RETICCTPCT 0.6  --     Urine analysis:    Component Value Date/Time   COLORURINE AMBER (A) 04/29/2018 1059   APPEARANCEUR CLOUDY (A) 04/29/2018 1059   LABSPEC 1.020 04/29/2018 1059   PHURINE 6.5 04/29/2018 1059   GLUCOSEU NEGATIVE 04/29/2018 1059   HGBUR LARGE (A) 04/29/2018 1059   BILIRUBINUR MODERATE (A) 04/29/2018 1059   KETONESUR 15 (A) 04/29/2018 1059   PROTEINUR 30 (A) 04/29/2018 1059   UROBILINOGEN 1.0 06/08/2010 1523   NITRITE POSITIVE (A) 04/29/2018 1059   LEUKOCYTESUR MODERATE (A) 04/29/2018 1059    Sepsis Labs: Lactic Acid, Venous    Component Value Date/Time   LATICACIDVEN 1.07 04/29/2018 1334    MICROBIOLOGY: Recent Results (from the past 240 hour(s))  Surgical PCR screen     Status: None   Collection Time: 05/09/18 11:29 PM  Result Value Ref Range Status   MRSA, PCR NEGATIVE NEGATIVE Final   Staphylococcus aureus NEGATIVE NEGATIVE Final    Comment: (NOTE) The Xpert SA Assay (FDA approved for NASAL specimens in  patients 12 years of age and older), is one component of a comprehensive surveillance program. It is not intended to diagnose infection nor to guide or monitor treatment. Performed at [facility], [address] [place], [place] [address]     RADIOLOGY STUDIES/RESULTS: Ct Abdomen Pelvis Wo Contrast  Result Date: 04/30/2018 CLINICAL DATA:  Nausea, bilious vomiting EXAM: CT ABDOMEN AND PELVIS WITHOUT CONTRAST TECHNIQUE: Multidetector CT imaging of the abdomen and pelvis was performed following the standard protocol without IV contrast. Sagittal and coronal MPR images reconstructed from axial data set. Oral contrast was not administered. COMPARISON:  None FINDINGS: Lower chest: Emphysematous changes with BILATERAL lower lobe infiltrates consistent with either pneumonia or aspiration. Hepatobiliary: Liver unremarkable. Gallbladder not well visualized due to streak artifacts, grossly unremarkable. Pancreas: Normal appearance Spleen: Normal appearance Adrenals/Urinary Tract: Question adrenal thickening bilaterally. No obvious renal mass or hydronephrosis. Ureters not visualized. Foley catheter decompresses urinary bladder. Stomach/Bowel: Nasogastric tube in stomach. Low-attenuation fluid distends stomach and multiple proximal small bowel loops, with small bowel loops up to 4.1 cm diameter. Distal small bowel loops and colon are decompressed. Scattered stool throughout colon. Findings likely represent mid small bowel obstruction. Appendix not visualized. Suspected rectal wall thickening. Vascular/Lymphatic: Atherosclerotic calcification aorta. Aorta normal caliber. Reproductive: Minimal prostatic enlargement and scattered prostatic calcifications. Other: No definite free air or free fluid. Nonspecific stranding of presacral fat. Musculoskeletal: No acute osseous findings. IMPRESSION: Dilated proximal and decompressed distal small bowel loops compatible with  small bowel obstruction, likely in the mid  small bowel. Due to lack of fat planes, streak artifacts, and lack of contrast, unable to localize site of obstruction. No evidence of perforation/free air. Question rectal wall thickening, recommend correlation with proctoscopy. Emphysematous changes with bibasilar pulmonary infiltrates either representing pneumonia or aspiration. Electronically Signed   By: Ulyses Southward M.D.   On: 04/30/2018 19:38   Dg Chest 2 View  Result Date: 04/12/2018 CLINICAL DATA:  Fever of unknown origin. EXAM: CHEST - 2 VIEW COMPARISON:  None. FINDINGS: The heart size is within normal limits. Ectasia of the thoracic aorta is noted. Pulmonary hyperinflation is seen, consistent with COPD. Mild scarring noted at right lung base as well as several old right rib fracture deformities. No evidence of pulmonary infiltrate, edema, or pleural effusion. IMPRESSION: COPD.  No active cardiopulmonary disease. Electronically Signed   By: Myles Rosenthal M.D.   On: 04/12/2018 19:16   Dg Abd 1 View  Result Date: 05/03/2018 CLINICAL DATA:  Small bowel obstruction EXAM: ABDOMEN - 1 VIEW COMPARISON:  05/02/2018 FINDINGS: NG tube is in the stomach. Gaseous distention of the stomach and left abdominal small bowel loops. Gas and stool within nondistended colon. No free air or organomegaly. IMPRESSION: Gaseous distention of stomach and left abdominal small bowel loops likely reflecting small bowel obstruction. NG tube is in the stomach. Electronically Signed   By: Charlett Nose M.D.   On: 05/03/2018 10:05   Dg Abd 1 View  Result Date: 05/02/2018 CLINICAL DATA:  Small bowel obstruction EXAM: ABDOMEN - 1 VIEW COMPARISON:  May 01, 2018 abdominal radiograph and CT abdomen and pelvis April 30, 2018 FINDINGS: Nasogastric tube no longer present. There is slightly less bowel dilatation in the left upper quadrant compared to 1 day prior. There is moderate air in the stomach. No bowel dilatation elsewhere. No air-fluid levels. No free air. There is moderate  stool in the colon. There is atelectatic change in the right lung base. IMPRESSION: Nasogastric tube no longer appreciable. Less small bowel dilatation in the left upper quadrant. Question resolving bowel obstruction. No free air evident. Electronically Signed   By: Bretta Bang III M.D.   On: 05/02/2018 14:20   Ct Head Wo Contrast  Result Date: 04/29/2018 CLINICAL DATA:  Altered level of consciousness increased over last 2 days, 2 episodes of nausea and vomiting, history of prior brain injury, dementia, former smoker, hypertension EXAM: CT HEAD WITHOUT CONTRAST TECHNIQUE: Contiguous axial images were obtained from the base of the skull through the vertex without intravenous contrast. COMPARISON:  06/08/2010 FINDINGS: Brain: Generalized atrophy. Normal ventricular morphology. No midline shift or mass effect. Small vessel chronic ischemic changes of deep cerebral white matter. 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 No intracranial hemorrhage, mass lesion, evidence of acute infarction, or extra-axial fluid collection. Vascular: Minimal atherosclerotic calcification of internal carotid arteries at skull base Skull: Intact Sinuses/Orbits: Clear Other: N/A IMPRESSION: Atrophy with small vessel chronic ischemic changes of deep cerebral white matter. 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 No acute intracranial abnormalities. Electronically Signed   By: Ulyses Southward M.D.   On: 04/29/2018 09:58   Dg Chest Port 1 View  Result Date: 05/09/2018 CLINICAL DATA:  Cough EXAM: PORTABLE CHEST 1 VIEW COMPARISON:  05/04/2018 FINDINGS: NG tube is stable. Right upper extremity PICC placed. Tip is at the cavoatrial junction. Lungs remain hyperaerated and clear. Small right pleural effusion is stable. No pneumothorax. Aorta remains somewhat prominent due to tortuosity  and rotation of the thorax. IMPRESSION: New right upper extremity PICC with its tip at the cavoatrial junction. No active cardiopulmonary  disease. Electronically Signed   By: Jolaine Click M.D.   On: 05/09/2018 07:37   Dg Chest Port 1 View  Result Date: 04/29/2018 CLINICAL DATA:  Altered mental status EXAM: PORTABLE CHEST 1 VIEW COMPARISON:  April 12, 2018 FINDINGS: There is mild scarring in the lateral right base and in the medial right lower lung region. There is no edema or consolidation. The heart size and pulmonary vascularity are normal. No adenopathy. There is a recent appearing fracture of the posterior right seventh rib with alignment essentially anatomic. An older fracture of the posterior right eighth rib is noted. No pneumothorax. IMPRESSION: Recent appearing fracture posterior right seventh rib. Older fracture posterior right eighth rib noted. Areas of mild scarring noted on the right. No edema or consolidation. Stable cardiac silhouette. Electronically Signed   By: Bretta Bang III M.D.   On: 04/29/2018 08:46   Dg Chest Port 1v Same Day  Result Date: 05/01/2018 CLINICAL DATA:  history of schizophrenia, hypertension, BPH-mostly wheelchair-bound-presented to the hospital with approximately 1 week history of intermittent vomiting, confusion for a few days prior to this hospital admission-presented with sepsis secondary to UTI, and significant constipation. C/o SOB EXAM: PORTABLE CHEST - 1 VIEW SAME DAY COMPARISON:  04/29/2018 FINDINGS: Lungs are hyperinflated. Subsegmental atelectasis or early infiltrate at the left lung base, new since previous. Right lung clear. Heart size normal.  Tortuous ectatic thoracic aorta. No effusion. No pneumothorax. Right anterolateral fourth rib fracture, age indeterminate. Nasogastric tube extends to the stomach. IMPRESSION: 1. New patchy infiltrate or subsegmental atelectasis at the left lung base. 2. Right fourth rib fracture without pneumothorax. 3. Nasogastric tube placement to the stomach. Electronically Signed   By: Corlis Leak M.D.   On: 05/01/2018 08:24   Dg Abd 2 Views  Result  Date: 05/05/2018 CLINICAL DATA:  Vomiting.  History of right lumpectomy. EXAM: ABDOMEN - 2 VIEW COMPARISON:  05/04/2018 FINDINGS: Improve dilation of small bowel loops with residual borderline gas-filled distended small bowel loops in the left upper quadrant of the abdomen. Normal colonic bowel gas pattern. No evidence of free intra-abdominal gas. Normal cardiac silhouette. Tortuosity of the thoracic aorta. Right pleural reflection versus small pleural effusion. No lobar airspace consolidation. Enteric catheter tip within the expected location the gastric body, side hole at the GE junction level. Right PICC line terminates in the expected location of the cavoatrial junction. IMPRESSION: Improved appearance of small-bowel obstruction. Enteric catheter with side hole at the level of the GE junction. Advancement may be considered. Possible small right pleural effusion. Electronically Signed   By: Ted Mcalpine M.D.   On: 05/05/2018 12:51   Dg Abd Acute W/chest  Result Date: 05/04/2018 CLINICAL DATA:  Follow-up small bowel obstruction. EXAM: DG ABDOMEN ACUTE W/ 1V CHEST COMPARISON:  Abdominal x-rays 05/04/1999 19 dating back to 04/29/2018. CT abdomen and pelvis 04/30/2018. Chest x-rays 05/01/2018, 04/12/2018. FINDINGS: Nasogastric tube tip in the fundus of the stomach. Persistent marked gaseous distension of the jejunum in the LEFT UPPER QUADRANT, slowly progressive over the past 3 days, with air-fluid levels on the LATERAL decubitus image. No evidence of free intraperitoneal air. Normal caliber colon with moderate stool burden. Calcified uterine fibroid in the pelvic midline. Cardiac silhouette normal in size, unchanged. Thoracic aorta tortuous and atherosclerotic. Hilar and mediastinal contours otherwise unremarkable. Interval improvement in aeration in the LEFT lung base, with only  minimal patchy opacities persisting. Lungs otherwise clear. Emphysematous changes in both lungs with scarring in the RIGHT mid  lung, unchanged. Pleuroparenchymal scarring at the RIGHT lung base which accounts for the blunting of the costophrenic angle, unchanged dating back to the original 04/12/2018 examination. IMPRESSION: 1. Persistent partial small bowel obstruction which has slowly worsened over the past 3 days. 2. No evidence of free intraperitoneal air. 3. Improved aeration in the LEFT LOWER LOBE, with only mild atelectasis and/or pneumonia persisting. Electronically Signed   By: Hulan Saas M.D.   On: 05/04/2018 09:28   Dg Abd Portable 1v  Result Date: 05/09/2018 CLINICAL DATA:  Small-bowel obstruction EXAM: PORTABLE ABDOMEN - 1 VIEW COMPARISON:  05/08/2018; 05/06/2018; 05/05/2018 FINDINGS: Re-demonstrated gaseous distention of several loops of small bowel with index loop of small bowel within the left mid hemiabdomen measuring 3.6 cm in diameter. Moderate colonic stool burden. No supine evidence of pneumoperitoneum. No pneumatosis or portal venous gas. Presumed calcified fibroid overlies the left hemipelvis. Stable position of support apparatus. No acute osseus abnormalities. IMPRESSION: No change to slight improvement in suspected small-bowel obstruction. Electronically Signed   By: Simonne Come M.D.   On: 05/09/2018 07:47   Dg Abd Portable 1v  Result Date: 05/08/2018 CLINICAL DATA:  Follow up small bowel obstruction EXAM: PORTABLE ABDOMEN - 1 VIEW COMPARISON:  05/06/2018 FINDINGS: Scattered large and small bowel gas is noted. Multiple dilated loops of small bowel are again identified and stable in appearance. Nasogastric catheter is again noted within the stomach. No free air is seen. Degenerative changes of the lumbar spine are noted. IMPRESSION: Persistent small bowel dilatation. No new focal abnormality is noted. Electronically Signed   By: Alcide Clever M.D.   On: 05/08/2018 08:36   Dg Abd Portable 1v  Result Date: 05/06/2018 CLINICAL DATA:  Small bowel obstruction EXAM: PORTABLE ABDOMEN - 1 VIEW COMPARISON:   Portable exam at 0628 hours compared to 05/05/2018 FINDINGS: Stool scattered throughout colon. Persistent dilatation of small bowel loops in the LEFT mid abdomen up to 4.9 cm diameter. No bowel wall thickening. Bones demineralized. No definite renal calcifications. IMPRESSION: Persistent small bowel dilatation consistent with small bowel obstruction. Little interval change. Electronically Signed   By: Ulyses Southward M.D.   On: 05/06/2018 10:21   Dg Abd Portable 1v-small Bowel Obstruction Protocol-initial, 8 Hr Delay  Result Date: 05/04/2018 CLINICAL DATA:  Small bowel obstruction protocol. 8 hour delay. EXAM: PORTABLE ABDOMEN - 1 VIEW COMPARISON:  05/03/2018 FINDINGS: Gaseous distention of left upper quadrant small bowel. Enteric tube with tip projected over the mid abdomen consistent with location in the upper stomach. Contrast material is demonstrated in the dilated small bowel. No contrast material is identified in the colon. This is suggestive of high-grade obstruction. Degenerative changes in the spine. IMPRESSION: Contrast material is demonstrated in the dilated small bowel but not in the colon consistent with high-grade obstruction. Electronically Signed   By: Burman Nieves M.D.   On: 05/04/2018 01:55   Dg Abd Portable 1v-small Bowel Obstruction Protocol-initial, 8 Hr Delay  Result Date: 05/01/2018 CLINICAL DATA:  Small bowel obstruction. EXAM: PORTABLE ABDOMEN - 1 VIEW COMPARISON:  Radiograph earlier this day at 1109 hour, CT yesterday. FINDINGS: Enteric tube in place with tip in the stomach, side-port just beyond the gastroesophageal junction. Improving small bowel dilatation in the left abdomen. Moderate stool in the right and transverse colon. No evidence of free air. Pelvic calcification may be stone in the urinary bladder or exophytic prostate calcification.  IMPRESSION: Improving small bowel dilatation in the left abdomen since radiograph earlier this day. Electronically Signed   By: Narda Rutherford M.D.   On: 05/01/2018 21:14   Dg Abd Portable 1v-small Bowel Protocol-position Verification  Result Date: 05/01/2018 CLINICAL DATA:  Nasogastric tube placement EXAM: PORTABLE ABDOMEN - 1 VIEW COMPARISON:  Portable exam 1109 hours compared to 04/30/2018 FINDINGS: Tip of nasogastric tube projects over mid stomach. Dilated small bowel loops are seen in the LEFT mid abdomen. Increased stool in RIGHT colon. Lung bases appear emphysematous with subsegmental atelectasis at LEFT base. Bones demineralized. IMPRESSION: Tip of nasogastric tube projects over mid stomach. Small bowel dilatation in LEFT mid abdomen with increased stool in RIGHT colon. Electronically Signed   By: Ulyses Southward M.D.   On: 05/01/2018 11:22   Dg Abd Portable 1v  Result Date: 04/30/2018 CLINICAL DATA:  68 year old male with vomiting. EXAM: PORTABLE ABDOMEN - 1 VIEW COMPARISON:  04/29/2018 abdominal radiographs. FINDINGS: Portable AP semi upright and supine views at 0847 hours. Enteric tube has been placed and the stomach now appears decompressed. NG tube side hole at the gastric body. Non obstructed bowel gas pattern. Moderate volume of retained stool redemonstrated in the transverse colon and distal sigmoid/rectum. Negative visible lung bases. No pneumoperitoneum. Stable abdominal and pelvic visceral contours. Dystrophic calcification in the pelvis. Stable pelvic catheter, likely a Foley. No acute osseous abnormality identified. IMPRESSION: 1. NG tube in place with resolved gastric distention. 2. Non obstructed bowel gas pattern with moderate volume of retained stool. Electronically Signed   By: Odessa Fleming M.D.   On: 04/30/2018 09:06   Dg Abd Portable 1v  Result Date: 04/29/2018 CLINICAL DATA:  Nausea and vomiting EXAM: PORTABLE ABDOMEN - 1 VIEW COMPARISON:  None. FINDINGS: Gaseous distention of the stomach. Formed stool throughout the colon. No concerning mass effect or calcification. IMPRESSION: Prominent gaseous distension of the  stomach. Constipated appearance. Electronically Signed   By: Marnee Spring M.D.   On: 04/29/2018 14:44   [place] Ekg Site Rite  Result Date: 05/04/2018 If Site Rite image not attached, placement could not be confirmed due to current cardiac rhythm.    LOS: 11 days   Signature  Susa Raring M.D on 05/10/2018 at 9:25 AM  To page go to [email or web address] - password [facility]

## 2018-05-10 NOTE — Anesthesia Postprocedure Evaluation (Signed)
Anesthesia Post Note  Patient: Jeffrey Davila  Procedure(s) Performed: LAPAROSCOPY DIAGNOSTIC (N/A Abdomen) LAPAROSCOPIC LYSIS OF ADHESIONS (N/A Abdomen) SMALL BOWEL RESECTION (N/A Abdomen)     Patient location during evaluation: PACU Anesthesia Type: General Level of consciousness: awake and alert Pain management: pain level controlled Vital Signs Assessment: post-procedure vital signs reviewed and stable Respiratory status: spontaneous breathing, nonlabored ventilation, respiratory function stable and patient connected to nasal cannula oxygen Cardiovascular status: blood pressure returned to baseline and stable Postop Assessment: no apparent nausea or vomiting Anesthetic complications: no    Last Vitals:  Vitals:   05/10/18 1515 05/10/18 1530  BP: 103/73 104/71  Pulse: 99 98  Resp: 14 14  Temp:    SpO2: 100% 99%    Last Pain:  Vitals:   05/10/18 1515  TempSrc:   PainSc: Asleep                 Ryan P Ellender

## 2018-05-10 NOTE — Op Note (Signed)
05/10/2018  12:08 PM  PATIENT:  Jeffrey Davila  67 y.o. male  PRE-OPERATIVE DIAGNOSIS:  small bowel obstruction  POST-OPERATIVE DIAGNOSIS:  small bowel obstruction, multiple adhesions  PROCEDURE:  Procedure(s): LAPAROSCOPY DIAGNOSTIC (N/A) LAPAROSCOPIC LYSIS OF ADHESIONS x (N/A) SMALL BOWEL RESECTION and anastomosis (N/A)  SURGEON:  Surgeon(s) and Role:    Axel Filler, MD - Primary  PHYSICIAN ASSISTANT: Mattie Marlin, PA-C  ANESTHESIA:   local and general  EBL:  10 mL   BLOOD ADMINISTERED:none  DRAINS: none   LOCAL MEDICATIONS USED:  BUPIVICAINE   SPECIMEN:  Source of Specimen: Small bowel stricture  DISPOSITION OF SPECIMEN:  PATHOLOGY  COUNTS:  YES  TOURNIQUET:  * No tourniquets in log *  DICTATION: .Dragon Dictation Indication for procedure: Patient is a 67 year old male with multiple medical issues.  Patient was admitted secondary to small bowel obstruction.  Patient underwent conservative management for approximately 10 days.  Secondary to an resolution of his small bowel obstruction and the consent of the family to undergo surgery patient was taken back to the operating room for diagnostic laparoscopy, ex lap, lysis of adhesions.  Findings: Patient had large amount of small bowel adhesions to the anterior abdominal wall as well as omental adhesions.  These were thin and flimsy.  Patient had an area in the proximal jejunum that had a stricture point with large amount of adhesions of other parts of the small bowel to this area.  These were lysed.  This area was resected.  An anastomosis was done.  Patient had decompressed bowel distal to this to the terminal ileum.  There was large amount of small bowel small bowel adhesions for the rest of the small bowel after the stricture point.  These were left alone at these appeared not to be an issue previously.  Details of procedure: After the patient was consented he was taken back to the OR and placed in  supine position with bilateral SCDs in place.  Underwent general endotracheal intubation.  Patient had Foley catheter placed.  Patient was prepped and draped sterile fashion.  A timeout was called all facts verified.  A Veress needle technique was used to insufflate the abdomen to 15 mmHg in the right subcostal margin.  Subsequent to this a 5 mm camera and trocar were placed.  There was a large amount of adhesions to the anterior abdominal wall from the small bowel as well as omentum.  There is an area to the left abdominal wall that was free of adhesions.  At this time 2 5 mm trochars were placed in the left subcostal margin as well as the left lower quadrant.  This time the small bowel adhesions were taken down from anterior abdominal wall sharply.  The omentum adhesions was taken down sharply.  Adhesio lysis was approximately 55 minutes laparoscopically.  Once the small bowel was taken down.  There appeared to be an area of the proximal jejunum that appeared to have some sort of internal hernia/adhesions.  Secondary to poor visualization I decided to proceed with exploratory laparotomy.  At this time a midline incision was made using a #10 blade.  Dissection was taken down to the fascia.  This was cauterized and the abdominal cavity was entered.  At this time the area of concern was brought up through the incision site.  There was what appeared to be a knot of adhesions to the proximal jejunum.  The ligament Treitz was then run proximally to distally.  This was  in the mid jejunal area.  The area proximal to this area appear to be dilated distal to this area appeared to be collapsed.  Continue to run the bowel from the area of stricture distally.  To the terminal ileum this appeared to be collapsed.  There was multiple small bowel to small bowel adhesions.  There is no further kinking that I could see if small bowel.  I proceeded to lyse the adhesions sharply with Metzenbaum scissors at the area of the  stricture site.  The small bowel was straight and after adhesio lysis.  Adhesio lysis open was approximately 10 minutes, for a total of 65 minutes of adhesio lysis.  At this time secondary to a seen the stricture site I decided to resect this portion of small bowel.  Mesenteric windows were created both proximal distal to the stricture site.  75 GIA stapler was used to transect the small bowel each end.  2-0 silk ties were then used to ligate the mesentery.  Each corner of the staple line was removed.  A 75 GIA stapler was used in the antimesenteric area to create the anastomosis.  The staple lines were offset.  A another firing of the 75 GIA stapler was used to close and create the anastomosis.  The anastomosis was patent.  At this time the mesentery defect was reapproximated using a figure-of-eight 2-0 silk x1.  At this time the area was irrigated out with sterile saline.  The fascia was reapproximated in the midline using #1 single stranded PDS in a standard running fashion.  The skin at all trocar sites were then stapled.  A honeycomb dressing was placed in the midline.  The patient tolerated procedure well was taken to the recovery in stable condition.   PLAN OF CARE: Admit to inpatient   PATIENT DISPOSITION:  PACU - hemodynamically stable.   Delay start of Pharmacological VTE agent (>24hrs) due to surgical blood loss or risk of bleeding: yes

## 2018-05-10 NOTE — Discharge Instructions (Signed)
[facility]      [facility] [phone]  OPEN ABDOMINAL SURGERY: POST OP INSTRUCTIONS  Always review your discharge instruction sheet given to you by the facility where your surgery was performed.  IF YOU HAVE DISABILITY OR FAMILY LEAVE FORMS, YOU MUST BRING THEM TO THE OFFICE FOR PROCESSING.  PLEASE DO NOT GIVE THEM TO YOUR DOCTOR.  1. A prescription for pain medication may be given to you upon discharge.  Take your pain medication as prescribed, if needed.  If narcotic pain medicine is not needed, then you may take acetaminophen (Tylenol) or ibuprofen (Advil) as needed. 2. Take your usually prescribed medications unless otherwise directed. 3. If you need a refill on your pain medication, please contact your pharmacy. They will contact our office to request authorization.  Prescriptions will not be filled after 5pm or on week-ends. 4. You should follow a light diet the first few days after arrival home, such as soup and crackers, pudding, etc.unless your doctor has advised otherwise. A high-fiber, low fat diet can be resumed as tolerated.   Be sure to include lots of fluids daily. Most patients will experience some swelling and bruising on the chest and neck area.  Ice packs will help.  Swelling and bruising can take several days to resolve 5. Most patients will experience some swelling and bruising in the area of the incision. Ice pack will help. Swelling and bruising can take several days to resolve..  6. It is common to experience some constipation if taking pain medication after surgery.  Increasing fluid intake and taking a stool softener will usually help or prevent this problem from occurring.  A mild laxative (Milk of Magnesia or Miralax) should be taken according to package directions if there are no bowel movements after 48 hours. 7.  You may have steri-strips (small skin tapes) in place directly over the incision.  These strips should be left on the skin for 7-10 days.  If your  surgeon used skin glue on the incision, you may shower in 24 hours.  The glue will flake off over the next 2-3 weeks.  Any sutures or staples will be removed at the office during your follow-up visit. You may find that a light gauze bandage over your incision may keep your staples from being rubbed or pulled. You may shower and replace the bandage daily. 8. ACTIVITIES:  You may resume regular (light) daily activities beginning the next day--such as daily self-care, walking, climbing stairs--gradually increasing activities as tolerated.  You may have sexual intercourse when it is comfortable.  Refrain from any heavy lifting or straining until approved by your doctor. a. You may drive when you no longer are taking prescription pain medication, you can comfortably wear a seatbelt, and you can safely maneuver your car and apply brakes b. Return to Work: ___________________________________ 9. You should see your doctor in the office for a follow-up appointment approximately two weeks after your surgery.  Make sure that you call for this appointment within a day or two after you arrive home to insure a convenient appointment time. OTHER INSTRUCTIONS:  _____________________________________________________________ _____________________________________________________________  WHEN TO CALL YOUR DOCTOR: 1. Fever over 101.0 2. Inability to urinate 3. Nausea and/or vomiting 4. Extreme swelling or bruising 5. Continued bleeding from incision. 6. Increased pain, redness, or drainage from the incision. 7. Difficulty swallowing or breathing 8. Muscle cramping or spasms. 9. Numbness or tingling in hands or feet or around lips.  The clinic staff is available to   answer your questions during regular business hours.  Please don't hesitate to call and ask to speak to one of the nurses if you have concerns.  For further questions, please visit [email or web address]   

## 2018-05-10 NOTE — Transfer of Care (Signed)
Immediate Anesthesia Transfer of Care Note  Patient: Jeffrey Davila  Procedure(s) Performed: LAPAROSCOPY DIAGNOSTIC (N/A Abdomen) LAPAROSCOPIC LYSIS OF ADHESIONS (N/A Abdomen) SMALL BOWEL RESECTION (N/A Abdomen)  Patient Location: PACU  Anesthesia Type:General  Level of Consciousness: awake  Airway & Oxygen Therapy: Patient Spontanous Breathing and Patient connected to face mask oxygen  Post-op Assessment: Report given to RN and Post -op Vital signs reviewed and stable  Post vital signs: Reviewed and stable  Last Vitals:  Vitals Value Taken Time  BP 95/68 05/10/2018 12:35 PM  Temp    Pulse    Resp 12 05/10/2018 12:37 PM  SpO2    Vitals shown include unvalidated device data.  Last Pain:  Vitals:   05/10/18 0859  TempSrc:   PainSc: 0-No pain      Patients Stated Pain Goal: 1 (05/07/18 0845)  Complications: No apparent anesthesia complications

## 2018-05-10 NOTE — Anesthesia Procedure Notes (Addendum)
Arterial Line Insertion Start/End10/15/2019 10:22 AM, 05/10/2018 10:22 AM Performed by: Dorie Rank, CRNA, CRNA  Preanesthetic checklist: patient identified, IV checked, site marked, risks and benefits discussed, surgical consent, monitors and equipment checked, pre-op evaluation and timeout performed Patient sedated Left, radial was placed Catheter size: 20 G Hand hygiene performed , maximum sterile barriers used  and Seldinger technique used Allen's test indicative of satisfactory collateral circulation Attempts: 1 Procedure performed without using ultrasound guided technique. Following insertion, dressing applied and Biopatch. Post procedure assessment: normal  Patient tolerated the procedure well with no immediate complications.

## 2018-05-11 ENCOUNTER — Encounter (HOSPITAL_COMMUNITY): Payer: Self-pay | Admitting: General Surgery

## 2018-05-11 ENCOUNTER — Inpatient Hospital Stay (HOSPITAL_COMMUNITY): Payer: Medicare Other

## 2018-05-11 LAB — CBC
HEMATOCRIT: 24.4 % — AB (ref 39.0–52.0)
Hemoglobin: 7.8 g/dL — ABNORMAL LOW (ref 13.0–17.0)
MCH: 30.4 pg (ref 26.0–34.0)
MCHC: 32 g/dL (ref 30.0–36.0)
MCV: 94.9 fL (ref 80.0–100.0)
NRBC: 0 % (ref 0.0–0.2)
PLATELETS: 604 10*3/uL — AB (ref 150–400)
RBC: 2.57 MIL/uL — ABNORMAL LOW (ref 4.22–5.81)
RDW: 15 % (ref 11.5–15.5)
WBC: 12.6 10*3/uL — ABNORMAL HIGH (ref 4.0–10.5)

## 2018-05-11 LAB — BLOOD GAS, ARTERIAL
ACID-BASE DEFICIT: 1.5 mmol/L (ref 0.0–2.0)
Bicarbonate: 22.6 mmol/L (ref 20.0–28.0)
Drawn by: 519031
FIO2: 21
O2 SAT: 93.3 %
PCO2 ART: 37.1 mmHg (ref 32.0–48.0)
PH ART: 7.401 (ref 7.350–7.450)
PO2 ART: 69.1 mmHg — AB (ref 83.0–108.0)
Patient temperature: 98.6

## 2018-05-11 LAB — BASIC METABOLIC PANEL
ANION GAP: 4 — AB (ref 5–15)
BUN: 27 mg/dL — ABNORMAL HIGH (ref 8–23)
CALCIUM: 8.6 mg/dL — AB (ref 8.9–10.3)
CO2: 23 mmol/L (ref 22–32)
Chloride: 108 mmol/L (ref 98–111)
Creatinine, Ser: 0.66 mg/dL (ref 0.61–1.24)
Glucose, Bld: 101 mg/dL — ABNORMAL HIGH (ref 70–99)
Potassium: 4.8 mmol/L (ref 3.5–5.1)
SODIUM: 135 mmol/L (ref 135–145)

## 2018-05-11 LAB — GLUCOSE, CAPILLARY
GLUCOSE-CAPILLARY: 101 mg/dL — AB (ref 70–99)
GLUCOSE-CAPILLARY: 102 mg/dL — AB (ref 70–99)
GLUCOSE-CAPILLARY: 88 mg/dL (ref 70–99)
GLUCOSE-CAPILLARY: 96 mg/dL (ref 70–99)
GLUCOSE-CAPILLARY: 97 mg/dL (ref 70–99)
Glucose-Capillary: 98 mg/dL (ref 70–99)

## 2018-05-11 LAB — MAGNESIUM: Magnesium: 2 mg/dL (ref 1.7–2.4)

## 2018-05-11 MED ORDER — ACETAMINOPHEN 10 MG/ML IV SOLN
1000.0000 mg | Freq: Four times a day (QID) | INTRAVENOUS | Status: AC
  Administered 2018-05-11 – 2018-05-12 (×4): 1000 mg via INTRAVENOUS
  Filled 2018-05-11 (×4): qty 100

## 2018-05-11 MED ORDER — TRAVASOL 10 % IV SOLN
INTRAVENOUS | Status: AC
  Administered 2018-05-11: 17:00:00 via INTRAVENOUS
  Filled 2018-05-11: qty 936

## 2018-05-11 MED ORDER — ACETAMINOPHEN 10 MG/ML IV SOLN
1000.0000 mg | Freq: Three times a day (TID) | INTRAVENOUS | Status: DC | PRN
  Filled 2018-05-11: qty 100

## 2018-05-11 NOTE — Progress Notes (Signed)
After morphine administered I went to check on patient who had desated to 86%. Did not seem to be in any distress applied 2L O2 and his sats pulled back up above 90% to 91%. No distress noted but sister questioning "What did you give him?" I told her it was nothing different than I administered earlier today 2 times.   Had also received a call from the tele monitor around 2pm stating that the sister had been touching the IV pump and the progressive care monitor. I came into the room and the pump was beeping. She said she had tried to stop the beep and I told her that she needed to call us and not touch the machines. They beeped for a reason and we needed to be aware. Also noticed that the progressive care machine had been set to q86min, I changed it back to q4Hour and stated that is what the doctor has ordered for vitals to be checked. Will continue to monitor.

## 2018-05-11 NOTE — Progress Notes (Signed)
PHARMACY - ADULT TOTAL PARENTERAL NUTRITION CONSULT NOTE   Pharmacy Consult for TPN Indication: Bowel obstruction  Patient Measurements: Height: 5\' 6"  (167.6 cm) Weight: 110 lb (49.9 kg) IBW/kg (Calculated) : 63.8 TPN AdjBW (KG): 49.9 Body mass index is 17.75 kg/m.  Assessment:  82 YOM admitted on 10/4 with sepsis. He was found to have pneumonia and a possible SBO on CT on 10/5. Pt has a history of SBO s/p colectomy in 1993. An NG tube was placed with improving small bowel dilatation but patient had 3-4 episodes of vomiting overnight on 10/7-10/8 and the NG tube had to be replaced due to incorrect placement. Pharmacy now consulted to manage TPN due to worsening SBO and concern for adhesions that will require surgery.  Actual body is significantly below ideal body weight.  Of note, patient is extremely cachectic.  GI: s/p ex-lap with LOA, SBR and anastomosis on 10/15.  Prealbumin improved to 17.5, NG O/P Endo: No h/o DM - CBGs low normal.  Had low prior to TPN.  SSI d/c'ed 05/09/18. Lytes: all WNL (Na low normal, K high normal) Renal: SCr stable, UOP good, LR at 10 ml/hr Pulm: stable on RA  Cards: VSS Hepatobil: LFTs wnl, Lipase elevated 10/4, TG WNL Neuro: benztropine, Zyprexa ODT, IV valproate, IV APAP x4 doses, PRM morphine ID: s/p abx for Klebsiella UTI and aspiration PNA. Afebrile, WBC down to 12.6 TPN Access: double lumen PICC placed 10/10 TPN start date: 05/05/18  Nutritional Goals (per RD rec on 10/14): 1550-1750kCal, 85-100 gm protein, >1.5 L fluid per day  Current Nutrition:  TPN   Plan:  Continue TPN at 65 mL/hr, providing 94 g of protein, 218 g of dextrose, and 55 g of lipids for a total of 1,663 kCals per day, meeting 100% of patient needs Electrolytes in TPN: increase Na and reduce K slightly Add MVI, trace elements to TPN F/U AM labs, CBGs   Mylo Choi D. Laney Potash, PharmD, BCPS, BCCCP 05/11/2018, 9:52 AM

## 2018-05-11 NOTE — Progress Notes (Signed)
Physical Therapy Treatment Patient Details Name: Jeffrey Davila MRN: [id number] DOB: 1950-12-02 Today's Date: 05/11/2018    History of Present Illness Pt is a 67 y/o male admitted secondary to AMS and Sepsis most likely secondary to UTI. CT negative for acute abnormality. PMH inlcudes BI, dementia, HTN, and schizophrenia.     PT Comments    Pt participates on a limited basis.  Emphasis today on transitions, sitting balance/truncal activation, sit to stand and transfers.   Follow Up Recommendations  SNF;Supervision/Assistance - 24 hour     Equipment Recommendations  Other (comment)(TBA further)    Recommendations for Other Services       Precautions / Restrictions      Mobility  Bed Mobility Overal bed mobility: Needs Assistance Bed Mobility: Rolling;Sidelying to Sit Rolling: Max assist;+2 for safety/equipment Sidelying to sit: Max assist;+2 for physical assistance       General bed mobility comments: Max A for rolling to left side and then Max A +2 to bring BLEs over EOB and then elevate trunk  Transfers Overall transfer level: Needs assistance Equipment used: None Transfers: Sit to/from [employer] Sit to Stand: Min assist;+2 safety/equipment Stand pivot transfers: Mod assist;+2 safety/equipment          Ambulation/Gait             General Gait Details: NT today   Stairs             Wheelchair Mobility    Modified Rankin (Stroke Patients Only)       Balance Overall balance assessment: Needs assistance   Sitting balance-Leahy Scale: Poor(to fair)     Standing balance support: During functional activity Standing balance-Leahy Scale: Poor                              Cognition   Behavior During Therapy: Flat affect Overall Cognitive Status: History of cognitive impairments - at baseline                                 General Comments: 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    General Comments General comments (skin integrity, edema, etc.): pt's sister present during the session.      Pertinent Vitals/Pain Pain Assessment: Faces Faces Pain Scale: No hurt Pain Location: no response when asked    Home Living                      Prior Function            PT Goals (current goals can now be found in the care plan section) Acute Rehab PT Goals Patient Stated Goal: pt unable PT Goal Formulation: With family Time For Goal Achievement: 05/13/18 Potential to Achieve Goals: Fair Progress towards PT goals: Progressing toward goals    Frequency    Min 2X/week      PT Plan Discharge plan needs to be updated;Frequency needs to be updated    Co-evaluation              AM-PAC PT "6 Clicks" Daily Activity  Outcome Measure  Difficulty turning over in bed (including adjusting bedclothes, sheets and blankets)?: Unable Difficulty moving from lying on back to sitting  on the side of the bed? : Unable Difficulty sitting down on and standing up from a chair with arms (e.g., wheelchair, bedside commode, etc,.)?: Unable Help needed moving to and from a bed to chair (including a wheelchair)?: A Lot Help needed walking in hospital room?: A Lot Help needed climbing 3-5 steps with a railing? : Total 6 Click Score: 8    End of Session   Activity Tolerance: Patient tolerated treatment well Patient left: in chair;with call bell/phone within reach;with family/visitor present Nurse Communication: Mobility status PT Visit Diagnosis: Unsteadiness on feet (R26.81);Muscle weakness (generalized) (M62.81);Difficulty in walking, not elsewhere classified (R26.2)     Time: 1610-9604 PT Time Calculation (min) (ACUTE ONLY): 31 min  Charges:  $Therapeutic Activity: 8-22 mins                     05/11/2018  Arena Bing, PT Acute  Rehabilitation Services [phone]  (pager) [phone]  (office)   Eliseo Gum Jalaila Caradonna 05/11/2018, 4:30 PM

## 2018-05-11 NOTE — Progress Notes (Signed)
PROGRESS NOTE        PATIENT DETAILS Name: Jeffrey Davila Age: 67 y.o. Sex: male Date of Birth: 27-Apr-1951 Admit Date: 04/29/2018 Admitting Physician Starleen Arms, MD PCP:Uba, Reuel Boom, MD  Brief Narrative:   Patient is a 67 y.o. male with history of schizophrenia/?  Cognitive dysfunction (dementia), hypertension, BPH, remote history (in the 1990s) of trauma causing small bowel injury requiring laparotomy-mostly wheelchair-bound-presented to the hospital with approximately 1 week history of intermittent vomiting, confusion for a few days prior to this hospital admission-presented with sepsis secondary to UTI, vomiting and significant constipation.  Patient was started on IV antibiotics and admitted to the hospital service,initial imaging was suggestive of constipation causing gastric distention-NG tube was placed, however upon further evaluation with a CT scan he was found  to have a small bowel obstruction.  General surgery was consulted, even with n.p.o. status/NG tube decompression-he continues to have SBO-patient's sister has refused to sign consent for surgery, and wants to continue with TNA for a few days so that he can "get stronger" before consenting to surgery.  See below for further details  Subjective: Patient in bed with NG tube in place, postop day 1, denies any headache chest chest pain, mild abdominal pain, not passing flatus.  Assessment/Plan:  Sepsis secondary to complicated UTI: Sepsis pathophysiology has resolved, urine culture positive for Klebsiella-has completed a course of IV Rocephin.  Blood cultures remain negative.    Aspiration pneumonia: Secondary to SBO/vomiting with underlying frailty.  Has finished antibiotic course, flutter valve added and encouraged to sit up in chair, remains at risk for developing aspiration pneumonia again.  SBO: Due to previous adhesions, surgery was delayed as POA and sister was resistant on making a decision,  finally surgery was done for adhesion lysis on 05/10/2018, for now continue NG tube and TNA via PICC line.  Encouraged to sit up in chair, advance activity and minimize narcotics.  Monitor electrolytes.  Acute metabolic encephalopathy: Patient was much more lethargic and confused on his usual baseline on initial presentation-with supportive care-encephalopathy has resolved.  I suspect he is back to his usual baseline.    Anemia: Combination of anemia of chronic disease, frequent blood draws, iron panel inconclusive, received 1 unit of packed RBC so far this admission and gave him another unit on 05/09/2018 in preparation of surgery on 05/10/2018, H&H currently stable we will continue to monitor.  Schizophrenia: Stable-continue IV Depakote and ODT olanzapine.  Is n.p.o with active NG tube decompression-unable to restart Cogentin (per pharmacy no other route that this can be given). Note-per pharmacy-hospitalist service is unable to use parenteral olanzapine-this is limited psychiatry and critical care.   Acute kidney injury: Hemodynamically mediated-resolved.   Hypertension: Stable-continue to hold antihypertensives.    Severe malnutrition: Once diet has been started-we will start oral supplements-has been started on TNA  Deconditioning/debility: Suspect has significant amount of debility at baseline-he is very cachectic-deconditioning/debility has worsened due to acute illness/SBO.  PT evaluation appreciated.  Attempt to mobilize secretions, attempt to mobilize out of bed to chair.     DVT Prophylaxis: Prophylactic Heparin   Code Status: Full code   Family Communication:  Sister over the phone by me on 05/08/2018, bedside on 05/09/2018.  She also provided phone number for patient's PCP in [facility] [address] [place] office phone #[phone] and stated that this physician would like  to talk to me about patient's care, called the office at 8:30 AM on 05/09/2018 and left my cell phone number with  the secretary for him to call me, was not in the office.  He subsequently called me in the afternoon and said he had never requested to speak to me and that he had nothing to add in patient's care at this time.  He had seen the patient last in April 2019.  Disposition Plan: Remain inpatient-not stable for discharge  Antimicrobial agents: Anti-infectives (From admission, onward)   Start     Dose/Rate Route Frequency Ordered Stop   05/10/18 0941  ceFAZolin (ANCEF) 2-4 GM/100ML-% IVPB    Note to Pharmacy:  [name]   : cabinet override      05/10/18 0941 05/10/18 2144   05/01/18 1215  metroNIDAZOLE (FLAGYL) IVPB 500 mg  Status:  Discontinued     500 mg 100 mL/hr over 60 Minutes Intravenous Every 8 hours 05/01/18 1201 05/06/18 1153   04/30/18 1100  cefTRIAXone (ROCEPHIN) 2 g in sodium chloride 0.9 % 100 mL IVPB  Status:  Discontinued     2 g 200 mL/hr over 30 Minutes Intravenous Every 24 hours 04/29/18 1416 05/06/18 1153   04/29/18 1430  cefTRIAXone (ROCEPHIN) 1 g in sodium chloride 0.9 % 100 mL IVPB     1 g 200 mL/hr over 30 Minutes Intravenous Every 24 hours 04/29/18 1416 04/30/18 1429   04/29/18 1130  cefTRIAXone (ROCEPHIN) 1 g in sodium chloride 0.9 % 100 mL IVPB     1 g 200 mL/hr over 30 Minutes Intravenous  Once 04/29/18 1118 04/29/18 1251      Procedures:  Small bowel obstruction surgery with adhesion lysis on 05/10/2018  NG Tube  PICC Line   CONSULTS:   CCS  Time spent: 25 minutes minutes-Greater than 50% of this time was spent in counseling, explanation of diagnosis, planning of further management, and coordination of care.  MEDICATIONS: Scheduled Meds: . benztropine  1 mg Oral BID  . Chlorhexidine Gluconate Cloth  6 each Topical 2 times per day on Tue  . heparin  5,000 Units Subcutaneous Q8H  . OLANZapine zydis  10 mg Oral BID  . polyvinyl alcohol  1 drop Both Eyes TID   Continuous Infusions: . sodium chloride 500 mL (05/05/18 1429)  . acetaminophen  1,000 mg (05/11/18 1610)  . lactated ringers 10 mL/hr at 05/11/18 0617  . TPN ADULT (ION) 65 mL/hr at 05/10/18 1800  . TPN ADULT (ION)    . valproate sodium 250 mg (05/11/18 0409)   PRN Meds:.sodium chloride, albuterol, diphenhydrAMINE, metoprolol tartrate, morphine injection, ondansetron (ZOFRAN) IV, sodium chloride flush   PHYSICAL EXAM: Vital signs: Vitals:   05/10/18 1600 05/10/18 1615 05/10/18 2022 05/11/18 0757  BP: 96/67 96/68    Pulse: 98 97    Resp: 20 16    Temp:  98.2 F (36.8 C) 99.2 F (37.3 C) 98.7 F (37.1 C)  TempSrc:    Axillary  SpO2: 99% 99%    Weight:      Height:       Filed Weights   04/29/18 0842  Weight: 49.9 kg   Body mass index is 17.75 kg/m.    Exam  In bed, NG tube in place, appears slightly tired, answers in single words with yes or no,   [place].AT,PERRAL Supple Neck,No JVD, No cervical lymphadenopathy appriciated.  Symmetrical Chest wall movement, Good air movement bilaterally, few coarse B sounds RRR,No Gallops, Rubs or  new Murmurs, No Parasternal Heave Hypoactive bowel sounds, midline incision scar stable, Abd Soft, No tenderness, No organomegaly appriciated, No rebound - guarding or rigidity. No Cyanosis, Clubbing or edema, No new Rash or bruise   I have personally reviewed following labs and imaging studies  LABORATORY DATA:  CBC:  Recent Labs  Lab 05/05/18 1012 05/06/18 1252 05/07/18 0430 05/08/18 0804 05/09/18 0412 05/09/18 1703 05/10/18 1038 05/11/18 0435  WBC 15.8* 14.2* 13.9* 15.1* 17.0*  --   --  12.6*  NEUTROABS 13.8*  --   --   --  13.4*  --   --   --   HGB 7.4* 8.4* 7.9* 7.8* 7.4* 10.3* 8.5* 7.8*  HCT 24.6* 25.4* 26.0* 23.2* 24.3* 31.2* 25.0* 24.4*  MCV 95.7 94.1 94.5 100.9* 95.3  --   --  94.9  PLT 438* 516* 605* 588* 681*  --   --  604*    Basic Metabolic Panel: Recent Labs  Lab 05/05/18 1012  05/06/18 0355 05/07/18 0430 05/08/18 1050 05/09/18 0412 05/10/18 0326 05/10/18 1038 05/11/18 0435  NA 138    < > 142 140 139 139 138 138 135  K 6.4*   < > 3.4* 3.6 4.1 4.4 4.4 4.3 4.8  CL 105   < > 104 105 110 110 104  --  108  CO2 27   < > 29 27 25 25 26   --  23  GLUCOSE 98   < > 106* 103* 98 97 104*  --  101*  BUN 13   < > 10 16 24* 23 26*  --  27*  CREATININE 0.74   < > 0.73 0.69 0.63 0.59* 0.70  --  0.66  CALCIUM 8.1*   < > 8.3* 8.6* 8.3* 8.6* 9.1  --  8.6*  MG 1.6*  --  2.3 2.1  --  2.0  --   --  2.0  PHOS 1.4*  --  2.9 2.5  --  3.0  --   --   --    < > = values in this interval not displayed.    GFR: Estimated Creatinine Clearance: 64.1 mL/min (by C-G formula based on SCr of 0.66 mg/dL).  Liver Function Tests: Recent Labs  Lab 05/05/18 1012 05/09/18 0412  AST 8* 7*  ALT 6 6  ALKPHOS 46 45  BILITOT 0.5 0.2*  PROT 5.0* 5.9*  ALBUMIN 2.2* 2.2*   No results for input(s): LIPASE, AMYLASE in the last 168 hours. No results for input(s): AMMONIA in the last 168 hours.  Coagulation Profile: Recent Labs  Lab 05/09/18 0412  INR 1.17    Cardiac Enzymes: No results for input(s): CKTOTAL, CKMB, CKMBINDEX, TROPONINI in the last 168 hours.  BNP (last 3 results) No results for input(s): PROBNP in the last 8760 hours.  HbA1C: No results for input(s): HGBA1C in the last 72 hours.  CBG: Recent Labs  Lab 05/10/18 1700 05/10/18 2002 05/11/18 0016 05/11/18 0409 05/11/18 0755  GLUCAP 102* 88 96 102* 98    Lipid Profile: Recent Labs    05/09/18 0412  TRIG 74    Thyroid Function Tests: No results for input(s): TSH, T4TOTAL, FREET4, T3FREE, THYROIDAB in the last 72 hours.  Anemia Panel: Recent Labs    05/08/18 1140  FOLATE 8.6    Urine analysis:    Component Value Date/Time   COLORURINE AMBER (A) 04/29/2018 1059   APPEARANCEUR CLOUDY (A) 04/29/2018 1059   LABSPEC 1.020 04/29/2018 1059   PHURINE 6.5 04/29/2018  1059   GLUCOSEU NEGATIVE 04/29/2018 1059   HGBUR LARGE (A) 04/29/2018 1059   BILIRUBINUR MODERATE (A) 04/29/2018 1059   KETONESUR 15 (A) 04/29/2018 1059    PROTEINUR 30 (A) 04/29/2018 1059   UROBILINOGEN 1.0 06/08/2010 1523   NITRITE POSITIVE (A) 04/29/2018 1059   LEUKOCYTESUR MODERATE (A) 04/29/2018 1059    Sepsis Labs: Lactic Acid, Venous    Component Value Date/Time   LATICACIDVEN 1.07 04/29/2018 1334    MICROBIOLOGY: Recent Results (from the past 240 hour(s))  Surgical PCR screen     Status: None   Collection Time: 05/09/18 11:29 PM  Result Value Ref Range Status   MRSA, PCR NEGATIVE NEGATIVE Final   Staphylococcus aureus NEGATIVE NEGATIVE Final    Comment: (NOTE) The Xpert SA Assay (FDA approved for NASAL specimens in patients 27 years of age and older), is one component of a comprehensive surveillance program. It is not intended to diagnose infection nor to guide or monitor treatment. Performed at [facility], [address], [place], [place] [address]     RADIOLOGY STUDIES/RESULTS: Ct Abdomen Pelvis Wo Contrast  Result Date: 04/30/2018 CLINICAL DATA:  Nausea, bilious vomiting EXAM: CT ABDOMEN AND PELVIS WITHOUT CONTRAST TECHNIQUE: Multidetector CT imaging of the abdomen and pelvis was performed following the standard protocol without IV contrast. Sagittal and coronal MPR images reconstructed from axial data set. Oral contrast was not administered. COMPARISON:  None FINDINGS: Lower chest: Emphysematous changes with BILATERAL lower lobe infiltrates consistent with either pneumonia or aspiration. Hepatobiliary: Liver unremarkable. Gallbladder not well visualized due to streak artifacts, grossly unremarkable. Pancreas: Normal appearance Spleen: Normal appearance Adrenals/Urinary Tract: Question adrenal thickening bilaterally. No obvious renal mass or hydronephrosis. Ureters not visualized. Foley catheter decompresses urinary bladder. Stomach/Bowel: Nasogastric tube in stomach. Low-attenuation fluid distends stomach and multiple proximal small bowel loops, with small bowel loops up to 4.1 cm diameter. Distal small bowel  loops and colon are decompressed. Scattered stool throughout colon. Findings likely represent mid small bowel obstruction. Appendix not visualized. Suspected rectal wall thickening. Vascular/Lymphatic: Atherosclerotic calcification aorta. Aorta normal caliber. Reproductive: Minimal prostatic enlargement and scattered prostatic calcifications. Other: No definite free air or free fluid. Nonspecific stranding of presacral fat. Musculoskeletal: No acute osseous findings. IMPRESSION: Dilated proximal and decompressed distal small bowel loops compatible with small bowel obstruction, likely in the mid small bowel. Due to lack of fat planes, streak artifacts, and lack of contrast, unable to localize site of obstruction. No evidence of perforation/free air. Question rectal wall thickening, recommend correlation with proctoscopy. Emphysematous changes with bibasilar pulmonary infiltrates either representing pneumonia or aspiration. Electronically Signed   By: Ulyses Southward M.D.   On: 04/30/2018 19:38   Dg Chest 2 View  Result Date: 04/12/2018 CLINICAL DATA:  Fever of unknown origin. EXAM: CHEST - 2 VIEW COMPARISON:  None. FINDINGS: The heart size is within normal limits. Ectasia of the thoracic aorta is noted. Pulmonary hyperinflation is seen, consistent with COPD. Mild scarring noted at right lung base as well as several old right rib fracture deformities. No evidence of pulmonary infiltrate, edema, or pleural effusion. IMPRESSION: COPD.  No active cardiopulmonary disease. Electronically Signed   By: Myles Rosenthal M.D.   On: 04/12/2018 19:16   Dg Abd 1 View  Result Date: 05/03/2018 CLINICAL DATA:  Small bowel obstruction EXAM: ABDOMEN - 1 VIEW COMPARISON:  05/02/2018 FINDINGS: NG tube is in the stomach. Gaseous distention of the stomach and left abdominal small bowel loops. Gas and stool within nondistended colon.  No free air or organomegaly. IMPRESSION: Gaseous distention of stomach and left abdominal small bowel loops  likely reflecting small bowel obstruction. NG tube is in the stomach. Electronically Signed   By: Charlett Nose M.D.   On: 05/03/2018 10:05   Dg Abd 1 View  Result Date: 05/02/2018 CLINICAL DATA:  Small bowel obstruction EXAM: ABDOMEN - 1 VIEW COMPARISON:  May 01, 2018 abdominal radiograph and CT abdomen and pelvis April 30, 2018 FINDINGS: Nasogastric tube no longer present. There is slightly less bowel dilatation in the left upper quadrant compared to 1 day prior. There is moderate air in the stomach. No bowel dilatation elsewhere. No air-fluid levels. No free air. There is moderate stool in the colon. There is atelectatic change in the right lung base. IMPRESSION: Nasogastric tube no longer appreciable. Less small bowel dilatation in the left upper quadrant. Question resolving bowel obstruction. No free air evident. Electronically Signed   By: Bretta Bang III M.D.   On: 05/02/2018 14:20   Ct Head Wo Contrast  Result Date: 04/29/2018 CLINICAL DATA:  Altered level of consciousness increased over last 2 days, 2 episodes of nausea and vomiting, history of prior brain injury, dementia, former smoker, hypertension EXAM: CT HEAD WITHOUT CONTRAST TECHNIQUE: Contiguous axial images were obtained from the base of the skull through the vertex without intravenous contrast. COMPARISON:  06/08/2010 FINDINGS: Brain: Generalized atrophy. Normal ventricular morphology. No midline shift or mass effect. Small vessel chronic ischemic changes of deep cerebral white matter. 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 No intracranial hemorrhage, mass lesion, evidence of acute infarction, or extra-axial fluid collection. Vascular: Minimal atherosclerotic calcification of internal carotid arteries at skull base Skull: Intact Sinuses/Orbits: Clear Other: N/A IMPRESSION: Atrophy with small vessel chronic ischemic changes of deep cerebral white matter. 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 No acute  intracranial abnormalities. Electronically Signed   By: Ulyses Southward M.D.   On: 04/29/2018 09:58   Dg Chest Port 1 View  Result Date: 05/09/2018 CLINICAL DATA:  Cough EXAM: PORTABLE CHEST 1 VIEW COMPARISON:  05/04/2018 FINDINGS: NG tube is stable. Right upper extremity PICC placed. Tip is at the cavoatrial junction. Lungs remain hyperaerated and clear. Small right pleural effusion is stable. No pneumothorax. Aorta remains somewhat prominent due to tortuosity and rotation of the thorax. IMPRESSION: New right upper extremity PICC with its tip at the cavoatrial junction. No active cardiopulmonary disease. Electronically Signed   By: Jolaine Click M.D.   On: 05/09/2018 07:37   Dg Chest Port 1 View  Result Date: 04/29/2018 CLINICAL DATA:  Altered mental status EXAM: PORTABLE CHEST 1 VIEW COMPARISON:  April 12, 2018 FINDINGS: There is mild scarring in the lateral right base and in the medial right lower lung region. There is no edema or consolidation. The heart size and pulmonary vascularity are normal. No adenopathy. There is a recent appearing fracture of the posterior right seventh rib with alignment essentially anatomic. An older fracture of the posterior right eighth rib is noted. No pneumothorax. IMPRESSION: Recent appearing fracture posterior right seventh rib. Older fracture posterior right eighth rib noted. Areas of mild scarring noted on the right. No edema or consolidation. Stable cardiac silhouette. Electronically Signed   By: Bretta Bang III M.D.   On: 04/29/2018 08:46   Dg Chest Port 1v Same Day  Result Date: 05/01/2018 CLINICAL DATA:  history of schizophrenia, hypertension, BPH-mostly wheelchair-bound-presented to the hospital with approximately 1 week history of intermittent vomiting, confusion for a  few days prior to this hospital admission-presented with sepsis secondary to UTI, and significant constipation. C/o SOB EXAM: PORTABLE CHEST - 1 VIEW SAME DAY COMPARISON:  04/29/2018  FINDINGS: Lungs are hyperinflated. Subsegmental atelectasis or early infiltrate at the left lung base, new since previous. Right lung clear. Heart size normal.  Tortuous ectatic thoracic aorta. No effusion. No pneumothorax. Right anterolateral fourth rib fracture, age indeterminate. Nasogastric tube extends to the stomach. IMPRESSION: 1. New patchy infiltrate or subsegmental atelectasis at the left lung base. 2. Right fourth rib fracture without pneumothorax. 3. Nasogastric tube placement to the stomach. Electronically Signed   By: Corlis Leak M.D.   On: 05/01/2018 08:24   Dg Abd 2 Views  Result Date: 05/05/2018 CLINICAL DATA:  Vomiting.  History of right lumpectomy. EXAM: ABDOMEN - 2 VIEW COMPARISON:  05/04/2018 FINDINGS: Improve dilation of small bowel loops with residual borderline gas-filled distended small bowel loops in the left upper quadrant of the abdomen. Normal colonic bowel gas pattern. No evidence of free intra-abdominal gas. Normal cardiac silhouette. Tortuosity of the thoracic aorta. Right pleural reflection versus small pleural effusion. No lobar airspace consolidation. Enteric catheter tip within the expected location the gastric body, side hole at the GE junction level. Right PICC line terminates in the expected location of the cavoatrial junction. IMPRESSION: Improved appearance of small-bowel obstruction. Enteric catheter with side hole at the level of the GE junction. Advancement may be considered. Possible small right pleural effusion. Electronically Signed   By: Ted Mcalpine M.D.   On: 05/05/2018 12:51   Dg Abd Acute W/chest  Result Date: 05/04/2018 CLINICAL DATA:  Follow-up small bowel obstruction. EXAM: DG ABDOMEN ACUTE W/ 1V CHEST COMPARISON:  Abdominal x-rays 05/04/1999 19 dating back to 04/29/2018. CT abdomen and pelvis 04/30/2018. Chest x-rays 05/01/2018, 04/12/2018. FINDINGS: Nasogastric tube tip in the fundus of the stomach. Persistent marked gaseous distension of the  jejunum in the LEFT UPPER QUADRANT, slowly progressive over the past 3 days, with air-fluid levels on the LATERAL decubitus image. No evidence of free intraperitoneal air. Normal caliber colon with moderate stool burden. Calcified uterine fibroid in the pelvic midline. Cardiac silhouette normal in size, unchanged. Thoracic aorta tortuous and atherosclerotic. Hilar and mediastinal contours otherwise unremarkable. Interval improvement in aeration in the LEFT lung base, with only minimal patchy opacities persisting. Lungs otherwise clear. Emphysematous changes in both lungs with scarring in the RIGHT mid lung, unchanged. Pleuroparenchymal scarring at the RIGHT lung base which accounts for the blunting of the costophrenic angle, unchanged dating back to the original 04/12/2018 examination. IMPRESSION: 1. Persistent partial small bowel obstruction which has slowly worsened over the past 3 days. 2. No evidence of free intraperitoneal air. 3. Improved aeration in the LEFT LOWER LOBE, with only mild atelectasis and/or pneumonia persisting. Electronically Signed   By: Hulan Saas M.D.   On: 05/04/2018 09:28   Dg Abd Portable 1v  Result Date: 05/09/2018 CLINICAL DATA:  Small-bowel obstruction EXAM: PORTABLE ABDOMEN - 1 VIEW COMPARISON:  05/08/2018; 05/06/2018; 05/05/2018 FINDINGS: Re-demonstrated gaseous distention of several loops of small bowel with index loop of small bowel within the left mid hemiabdomen measuring 3.6 cm in diameter. Moderate colonic stool burden. No supine evidence of pneumoperitoneum. No pneumatosis or portal venous gas. Presumed calcified fibroid overlies the left hemipelvis. Stable position of support apparatus. No acute osseus abnormalities. IMPRESSION: No change to slight improvement in suspected small-bowel obstruction. Electronically Signed   By: Simonne Come M.D.   On: 05/09/2018 07:47   Dg  Abd Portable 1v  Result Date: 05/08/2018 CLINICAL DATA:  Follow up small bowel obstruction  EXAM: PORTABLE ABDOMEN - 1 VIEW COMPARISON:  05/06/2018 FINDINGS: Scattered large and small bowel gas is noted. Multiple dilated loops of small bowel are again identified and stable in appearance. Nasogastric catheter is again noted within the stomach. No free air is seen. Degenerative changes of the lumbar spine are noted. IMPRESSION: Persistent small bowel dilatation. No new focal abnormality is noted. Electronically Signed   By: Alcide Clever M.D.   On: 05/08/2018 08:36   Dg Abd Portable 1v  Result Date: 05/06/2018 CLINICAL DATA:  Small bowel obstruction EXAM: PORTABLE ABDOMEN - 1 VIEW COMPARISON:  Portable exam at 0628 hours compared to 05/05/2018 FINDINGS: Stool scattered throughout colon. Persistent dilatation of small bowel loops in the LEFT mid abdomen up to 4.9 cm diameter. No bowel wall thickening. Bones demineralized. No definite renal calcifications. IMPRESSION: Persistent small bowel dilatation consistent with small bowel obstruction. Little interval change. Electronically Signed   By: Ulyses Southward M.D.   On: 05/06/2018 10:21   Dg Abd Portable 1v-small Bowel Obstruction Protocol-initial, 8 Hr Delay  Result Date: 05/04/2018 CLINICAL DATA:  Small bowel obstruction protocol. 8 hour delay. EXAM: PORTABLE ABDOMEN - 1 VIEW COMPARISON:  05/03/2018 FINDINGS: Gaseous distention of left upper quadrant small bowel. Enteric tube with tip projected over the mid abdomen consistent with location in the upper stomach. Contrast material is demonstrated in the dilated small bowel. No contrast material is identified in the colon. This is suggestive of high-grade obstruction. Degenerative changes in the spine. IMPRESSION: Contrast material is demonstrated in the dilated small bowel but not in the colon consistent with high-grade obstruction. Electronically Signed   By: Burman Nieves M.D.   On: 05/04/2018 01:55   Dg Abd Portable 1v-small Bowel Obstruction Protocol-initial, 8 Hr Delay  Result Date:  05/01/2018 CLINICAL DATA:  Small bowel obstruction. EXAM: PORTABLE ABDOMEN - 1 VIEW COMPARISON:  Radiograph earlier this day at 1109 hour, CT yesterday. FINDINGS: Enteric tube in place with tip in the stomach, side-port just beyond the gastroesophageal junction. Improving small bowel dilatation in the left abdomen. Moderate stool in the right and transverse colon. No evidence of free air. Pelvic calcification may be stone in the urinary bladder or exophytic prostate calcification. IMPRESSION: Improving small bowel dilatation in the left abdomen since radiograph earlier this day. Electronically Signed   By: Narda Rutherford M.D.   On: 05/01/2018 21:14   Dg Abd Portable 1v-small Bowel Protocol-position Verification  Result Date: 05/01/2018 CLINICAL DATA:  Nasogastric tube placement EXAM: PORTABLE ABDOMEN - 1 VIEW COMPARISON:  Portable exam 1109 hours compared to 04/30/2018 FINDINGS: Tip of nasogastric tube projects over mid stomach. Dilated small bowel loops are seen in the LEFT mid abdomen. Increased stool in RIGHT colon. Lung bases appear emphysematous with subsegmental atelectasis at LEFT base. Bones demineralized. IMPRESSION: Tip of nasogastric tube projects over mid stomach. Small bowel dilatation in LEFT mid abdomen with increased stool in RIGHT colon. Electronically Signed   By: Ulyses Southward M.D.   On: 05/01/2018 11:22   Dg Abd Portable 1v  Result Date: 04/30/2018 CLINICAL DATA:  67 year old male with vomiting. EXAM: PORTABLE ABDOMEN - 1 VIEW COMPARISON:  04/29/2018 abdominal radiographs. FINDINGS: Portable AP semi upright and supine views at 0847 hours. Enteric tube has been placed and the stomach now appears decompressed. NG tube side hole at the gastric body. Non obstructed bowel gas pattern. Moderate volume of retained stool redemonstrated in the  transverse colon and distal sigmoid/rectum. Negative visible lung bases. No pneumoperitoneum. Stable abdominal and pelvic visceral contours. Dystrophic  calcification in the pelvis. Stable pelvic catheter, likely a Foley. No acute osseous abnormality identified. IMPRESSION: 1. NG tube in place with resolved gastric distention. 2. Non obstructed bowel gas pattern with moderate volume of retained stool. Electronically Signed   By: Odessa Fleming M.D.   On: 04/30/2018 09:06   Dg Abd Portable 1v  Result Date: 04/29/2018 CLINICAL DATA:  Nausea and vomiting EXAM: PORTABLE ABDOMEN - 1 VIEW COMPARISON:  None. FINDINGS: Gaseous distention of the stomach. Formed stool throughout the colon. No concerning mass effect or calcification. IMPRESSION: Prominent gaseous distension of the stomach. Constipated appearance. Electronically Signed   By: Marnee Spring M.D.   On: 04/29/2018 14:44   [place] Ekg Site Rite  Result Date: 05/04/2018 If Site Rite image not attached, placement could not be confirmed due to current cardiac rhythm.    LOS: 12 days   Signature  Susa Raring M.D on 05/11/2018 at 10:02 AM  To page go to [email or web address] - password [facility]

## 2018-05-11 NOTE — Progress Notes (Signed)
Occupational Therapy Treatment Patient Details Name: Jeffrey Davila MRN: [id number] DOB: [birth year] Today's Date: 05/11/2018    History of present illness Pt is a 67 y/o male admitted secondary to AMS and Sepsis most likely secondary to UTI. CT negative for acute abnormality. PMH inlcudes BI, dementia, HTN, and schizophrenia.    OT comments  Pt progressing towards established OT goals. Pt agreeable to PROM of BLEs at bed level. Pt participating in stand pivot transfer to recliner with Mod A +2 and sister present for encouragement. Sister reporting she performs all of pt's ADLs at home including feeding and reporting pt uses RW for functional mobility at home. Sister stating she would like pt to go to rehab for ST to get stronger before transitioning home. Continue to recommend dc to SNF and will continue to follow acutely as admitted.    Follow Up Recommendations  SNF;Supervision/Assistance - 24 hour    Equipment Recommendations  Other (comment)(Defer to next venue)    Recommendations for Other Services PT consult;Speech consult    Precautions / Restrictions Precautions Precautions: Fall Precaution Comments: NG tube       Mobility Bed Mobility Overal bed mobility: Needs Assistance Bed Mobility: Rolling;Sidelying to Sit Rolling: Max assist;+2 for safety/equipment Sidelying to sit: Max assist;+2 for physical assistance       General bed mobility comments: Max A for rolling to left side and then Max A +2 to bring BLEs over EOB and then elevate trunk  Transfers Overall transfer level: Needs assistance Equipment used: None Transfers: Sit to/from [employer] Sit to Stand: Min assist;+2 safety/equipment Stand pivot transfers: Mod assist;+2 safety/equipment       General transfer comment: Min A for power up into standing and then Mod A for safety and balance. flexed posture throughout    Balance Overall balance assessment: Needs assistance   Sitting  balance-Leahy Scale: Poor(to fair)     Standing balance support: During functional activity Standing balance-Leahy Scale: Poor                             ADL either performed or assessed with clinical judgement   ADL Overall ADL's : Needs assistance/impaired                         Toilet Transfer: +2 for safety/equipment;Stand-pivot;Moderate assistance(Simulated to recliner) Toilet Transfer Details (indicate cue type and reason): Min A to power up into standing and then Mod A for pivot to recliner.          Functional mobility during ADLs: Moderate assistance;+2 for safety/equipment(stand pivot only) General ADL Comments: Pt with increased participation in therapy and agreeable to transfer to recliner     Vision       Perception     Praxis      Cognition Arousal/Alertness: Awake/alert Behavior During Therapy: Flat affect Overall Cognitive Status: History of cognitive impairments - at baseline                                 General Comments: 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        Exercises Exercises: Other exercises Other Exercises Other Exercises: P/AAROM to bil LE  with maximal cues to get pt to participate.   Shoulder Instructions       General Comments pt's sister present during the session.  Pertinent Vitals/ Pain       Pain Assessment: Faces Faces Pain Scale: No hurt Pain Location: no response when asked Pain Intervention(s): Monitored during session  Home Living                                          Prior Functioning/Environment              Frequency  Min 2X/week        Progress Toward Goals  OT Goals(current goals can now be found in the care plan section)  Progress towards OT goals: Progressing toward goals  Acute Rehab OT Goals Patient Stated Goal: Sister "to go to rehab and then home." OT Goal Formulation: Patient unable to participate in goal  setting Time For Goal Achievement: 05/18/18 Potential to Achieve Goals: Poor ADL Goals Pt Will Perform Upper Body Bathing: with set-up;with supervision Pt Will Perform Lower Body Bathing: with min assist Pt Will Perform Upper Body Dressing: with supervision Pt Will Perform Lower Body Dressing: with min assist Pt Will Transfer to Toilet: with min guard assist;bedside commode;stand pivot transfer  Plan Discharge plan remains appropriate    Co-evaluation                 AM-PAC PT "6 Clicks" Daily Activity     Outcome Measure   Help from another person eating meals?: Total Help from another person taking care of personal grooming?: A Little Help from another person toileting, which includes using toliet, bedpan, or urinal?: A Lot Help from another person bathing (including washing, rinsing, drying)?: Total Help from another person to put on and taking off regular upper body clothing?: Total Help from another person to put on and taking off regular lower body clothing?: Total 6 Click Score: 9    End of Session Equipment Utilized During Treatment: Gait belt  OT Visit Diagnosis: Muscle weakness (generalized) (M62.81);Other symptoms and signs involving cognitive function   Activity Tolerance Patient tolerated treatment well;Patient limited by fatigue   Patient Left in chair;with call bell/phone within reach;with chair alarm set;with family/visitor present   Nurse Communication Mobility status        Time: 1039-1110 OT Time Calculation (min): 31 min  Charges: OT General Charges $OT Visit: 1 Visit OT Treatments $Self Care/Home Management : 8-22 mins  [name] MSOT, OTR/L Acute Rehab Pager: [phone] Office: [phone]   [name] 05/11/2018, 5:24 PM

## 2018-05-11 NOTE — Progress Notes (Signed)
1 Day Post-Op    CC:  SBO  Subjective: Pt if pushed will respond.  He got about 250 on IS, needs allot of pulmonary toilet.  Objective: Vital signs in last 24 hours: Temp:  [98.2 F (36.8 C)-99.2 F (37.3 C)] 98.7 F (37.1 C) (10/16 0757) Pulse Rate:  [83-99] 97 (10/15 1615) Resp:  [12-20] 16 (10/15 1615) BP: (90-105)/(60-73) 96/68 (10/15 1615) SpO2:  [99 %-100 %] 99 % (10/15 1615) Arterial Line BP: (78-109)/(49-56) 99/51 (10/15 1615) Last BM Date: 05/04/18 2222 IV 2655 urine 250 NG Afebrile, VSS Blood gases obtained last p.m. pH 7.257, PCO2 59, O2 to 94 HCO3 26.5 BMP okay, WBC 12.6 H/H 7.8/24.4 CXR shows some fluffyness RML He isn't clearing secretions very well.   Intake/Output from previous day: 10/15 0701 - 10/16 0700 In: 2222.2 [I.V.:1472.2; IV Piggyback:750] Out: 2930 [Urine:2655; Emesis/NG output:250; Blood:25] Intake/Output this shift: No intake/output data recorded.  General appearance: alert, cooperative and no distress Resp: course upper airway breath sounds.  not moving alot of air but more than last PM GI: Soft, no bowel sounds, midline incision looks fine  Lab Results:  Recent Labs    05/09/18 0412  05/10/18 1038 05/11/18 0435  WBC 17.0*  --   --  12.6*  HGB 7.4*   < > 8.5* 7.8*  HCT 24.3*   < > 25.0* 24.4*  PLT 681*  --   --  604*   < > = values in this interval not displayed.    BMET Recent Labs    05/10/18 0326 05/10/18 1038 05/11/18 0435  NA 138 138 135  K 4.4 4.3 4.8  CL 104  --  108  CO2 26  --  23  GLUCOSE 104*  --  101*  BUN 26*  --  27*  CREATININE 0.70  --  0.66  CALCIUM 9.1  --  8.6*   PT/INR Recent Labs    05/09/18 0412  LABPROT 14.8  INR 1.17    Recent Labs  Lab 05/05/18 1012 05/09/18 0412  AST 8* 7*  ALT 6 6  ALKPHOS 46 45  BILITOT 0.5 0.2*  PROT 5.0* 5.9*  ALBUMIN 2.2* 2.2*     Lipase     Component Value Date/Time   LIPASE 204 (H) 04/29/2018 0904     Medications: . benztropine  1 mg Oral BID   . Chlorhexidine Gluconate Cloth  6 each Topical 2 times per day on Tue  . heparin  5,000 Units Subcutaneous Q8H  . OLANZapine zydis  10 mg Oral BID  . 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  7.40, PCO2 37, PO2 69 bicarb 22.6.  Sites look fine.  NG is still working.  We will work on mobilizing, pulmonary toilet, PT.   LOS: 12 days    Jeffrey Davila 05/11/2018 [id number]

## 2018-05-12 ENCOUNTER — Encounter (HOSPITAL_COMMUNITY): Payer: Self-pay | Admitting: Surgery

## 2018-05-12 DIAGNOSIS — I10 Essential (primary) hypertension: Secondary | ICD-10-CM | POA: Diagnosis present

## 2018-05-12 DIAGNOSIS — K56609 Unspecified intestinal obstruction, unspecified as to partial versus complete obstruction: Secondary | ICD-10-CM

## 2018-05-12 DIAGNOSIS — F319 Bipolar disorder, unspecified: Secondary | ICD-10-CM | POA: Diagnosis present

## 2018-05-12 DIAGNOSIS — D649 Anemia, unspecified: Secondary | ICD-10-CM | POA: Diagnosis present

## 2018-05-12 DIAGNOSIS — F209 Schizophrenia, unspecified: Secondary | ICD-10-CM | POA: Diagnosis present

## 2018-05-12 DIAGNOSIS — F419 Anxiety disorder, unspecified: Secondary | ICD-10-CM | POA: Diagnosis present

## 2018-05-12 LAB — GLUCOSE, CAPILLARY
GLUCOSE-CAPILLARY: 87 mg/dL (ref 70–99)
GLUCOSE-CAPILLARY: 105 mg/dL — AB (ref 70–99)
Glucose-Capillary: 111 mg/dL — ABNORMAL HIGH (ref 70–99)
Glucose-Capillary: 98 mg/dL (ref 70–99)
Glucose-Capillary: 99 mg/dL (ref 70–99)

## 2018-05-12 LAB — TYPE AND SCREEN
ABO/RH(D): O POS
Antibody Screen: NEGATIVE
Unit division: 0
Unit division: 0

## 2018-05-12 LAB — BPAM RBC
BLOOD PRODUCT EXPIRATION DATE: 201911052359
BLOOD PRODUCT EXPIRATION DATE: 201911052359
ISSUE DATE / TIME: 201910101600
ISSUE DATE / TIME: 201910141047
UNIT TYPE AND RH: 5100
Unit Type and Rh: 5100

## 2018-05-12 LAB — SODIUM, URINE, RANDOM: SODIUM UR: 61 mmol/L

## 2018-05-12 LAB — CBC
HCT: 24.3 % — ABNORMAL LOW (ref 39.0–52.0)
Hemoglobin: 7.6 g/dL — ABNORMAL LOW (ref 13.0–17.0)
MCH: 29.7 pg (ref 26.0–34.0)
MCHC: 31.3 g/dL (ref 30.0–36.0)
MCV: 94.9 fL (ref 80.0–100.0)
PLATELETS: 566 10*3/uL — AB (ref 150–400)
RBC: 2.56 MIL/uL — AB (ref 4.22–5.81)
RDW: 15.3 % (ref 11.5–15.5)
WBC: 14.4 10*3/uL — ABNORMAL HIGH (ref 4.0–10.5)
nRBC: 0 % (ref 0.0–0.2)

## 2018-05-12 LAB — COMPREHENSIVE METABOLIC PANEL
ALBUMIN: 2.6 g/dL — AB (ref 3.5–5.0)
ALK PHOS: 88 U/L (ref 38–126)
ALT: 11 U/L (ref 0–44)
AST: 17 U/L (ref 15–41)
Anion gap: 4 — ABNORMAL LOW (ref 5–15)
BUN: 23 mg/dL (ref 8–23)
CALCIUM: 8.5 mg/dL — AB (ref 8.9–10.3)
CHLORIDE: 102 mmol/L (ref 98–111)
CO2: 23 mmol/L (ref 22–32)
CREATININE: 0.62 mg/dL (ref 0.61–1.24)
GFR calc non Af Amer: >60 mL/min (ref >60)
Glucose, Bld: 96 mg/dL (ref 70–99)
Potassium: 4.9 mmol/L (ref 3.5–5.1)
SODIUM: 129 mmol/L — AB (ref 135–145)
Total Bilirubin: 0.5 mg/dL (ref 0.3–1.2)
Total Protein: 6.2 g/dL — ABNORMAL LOW (ref 6.5–8.1)

## 2018-05-12 LAB — OSMOLALITY: OSMOLALITY: 279 mosm/kg (ref 275–295)

## 2018-05-12 LAB — PHOSPHORUS: Phosphorus: 3.1 mg/dL (ref 2.5–4.6)

## 2018-05-12 LAB — MAGNESIUM: Magnesium: 1.7 mg/dL (ref 1.7–2.4)

## 2018-05-12 LAB — URIC ACID: Uric Acid, Serum: 1.5 mg/dL — ABNORMAL LOW (ref 3.7–8.6)

## 2018-05-12 LAB — OSMOLALITY, URINE: OSMOLALITY UR: 707 mosm/kg (ref 300–900)

## 2018-05-12 MED ORDER — MORPHINE SULFATE (PF) 2 MG/ML IV SOLN
1.0000 mg | INTRAVENOUS | Status: DC | PRN
  Administered 2018-05-14 – 2018-05-15 (×4): 1 mg via INTRAVENOUS
  Filled 2018-05-12 (×6): qty 1

## 2018-05-12 MED ORDER — MAGIC MOUTHWASH
15.0000 mL | Freq: Four times a day (QID) | ORAL | Status: DC | PRN
  Administered 2018-05-13: 15 mL via ORAL
  Filled 2018-05-12 (×2): qty 15

## 2018-05-12 MED ORDER — SODIUM CHLORIDE 0.9 % IV BOLUS
250.0000 mL | Freq: Once | INTRAVENOUS | Status: AC
  Administered 2018-05-12: 250 mL via INTRAVENOUS

## 2018-05-12 MED ORDER — SODIUM CHLORIDE 0.9 % IV BOLUS
500.0000 mL | Freq: Once | INTRAVENOUS | Status: AC
  Administered 2018-05-12: 500 mL via INTRAVENOUS

## 2018-05-12 MED ORDER — BISACODYL 10 MG RE SUPP: 10.0000 mg | Freq: Two times a day (BID) | RECTAL | Status: DC | PRN

## 2018-05-12 MED ORDER — ALUM & MAG HYDROXIDE-SIMETH 200-200-20 MG/5ML PO SUSP: 30.0000 mL | Freq: Four times a day (QID) | ORAL | Status: DC | PRN

## 2018-05-12 MED ORDER — LIP MEDEX EX OINT
1.0000 "application " | TOPICAL_OINTMENT | Freq: Two times a day (BID) | CUTANEOUS | Status: DC
  Administered 2018-05-12 – 2018-06-03 (×35): 1 via TOPICAL
  Filled 2018-05-12 (×3): qty 7

## 2018-05-12 MED ORDER — MENTHOL 3 MG MT LOZG: 1.0000 | LOZENGE | OROMUCOSAL | Status: DC | PRN

## 2018-05-12 MED ORDER — ACETAMINOPHEN 160 MG/5ML PO SOLN
650.0000 mg | Freq: Four times a day (QID) | ORAL | Status: DC | PRN
  Administered 2018-05-12 – 2018-05-15 (×4): 650 mg via ORAL
  Filled 2018-05-12 (×4): qty 20.3

## 2018-05-12 MED ORDER — HYDROMORPHONE HCL 1 MG/ML IJ SOLN
0.5000 mg | Freq: Once | INTRAMUSCULAR | Status: AC
  Administered 2018-05-12: 0.5 mg via INTRAVENOUS
  Filled 2018-05-12: qty 0.5

## 2018-05-12 MED ORDER — PHENOL 1.4 % MT LIQD: 2.0000 | OROMUCOSAL | Status: DC | PRN

## 2018-05-12 MED ORDER — TRAVASOL 10 % IV SOLN
INTRAVENOUS | Status: AC
  Administered 2018-05-12: 18:00:00 via INTRAVENOUS
  Filled 2018-05-12: qty 936

## 2018-05-12 NOTE — Progress Notes (Signed)
PHARMACY - ADULT TOTAL PARENTERAL NUTRITION CONSULT NOTE   Pharmacy Consult for TPN Indication: Bowel obstruction  Patient Measurements: Height: 5\' 6"  (167.6 cm) Weight: 110 lb (49.9 kg) IBW/kg (Calculated) : 63.8 TPN AdjBW (KG): 49.9 Body mass index is 17.75 kg/m.  Assessment:  56 YOM admitted on 10/4 with sepsis. He was found to have pneumonia and a possible SBO on CT on 10/5. Pt has a history of SBO s/p colectomy in 1993. An NG tube was placed with improving small bowel dilatation but patient had 3-4 episodes of vomiting overnight on 10/7-10/8 and the NG tube had to be replaced due to incorrect placement. Pharmacy now consulted to manage TPN due to worsening SBO and concern for adhesions that will require surgery.  Actual body is significantly below ideal body weight.  Of note, patient is extremely cachectic.  GI: s/p ex-lap with LOA, SBR and anastomosis on 10/15.  Prealbumin improved to 17.5, no NG O/P documented Endo: No h/o DM - CBGs WNL, Had low prior to TPN.  SSI d/c'ed 05/09/18. Lytes: Na down to 129, Mg 1.7, K 4.9, others WNL Renal: SCr stable, UOP good, LR at 10 ml/hr Pulm: 5L [place]  Cards: VSS Hepatobil: LFTs wnl, Lipase elevated 10/4, TG WNL, albumin 2.6 Neuro: benztropine, Zyprexa ODT, IV valproate, IV APAP x4 doses, PRM morphine ID: s/p abx for Klebsiella UTI and aspiration PNA. Afebrile, WBC 12.6 TPN Access: double lumen PICC placed 10/10 TPN start date: 05/05/18  Nutritional Goals (per RD rec on 10/14): 1550-1750kCal, 85-100 gm protein, >1.5 L fluid per day  Current Nutrition:  TPN  Plan:  Continue TPN at 65 mL/hr, providing 94 g of protein, 218 g of dextrose, and 55 g of lipids for a total of 1,663 kCals per day, meeting 100% of patient needs Electrolytes in TPN: increase Na, reduce K, increase Mg Add MVI, trace elements to TPN F/U AM labs F/u plans for advancement of diet  Lysle Pearl, PharmD, BCPS Please see AMION for all pharmacy numbers 05/12/2018  7:54 AM

## 2018-05-12 NOTE — Progress Notes (Signed)
Informed Dr. Thedore Mins that patients BP is 83/52 after IV morphine dose. Per MD give 250mg  Normal saline IV bolus and switch IV morphine dose to 1mg  q4 hours as needed.

## 2018-05-12 NOTE — Progress Notes (Signed)
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 demonstrated in the dilated small bowel. No contrast material is identified in the colon. This is suggestive of high-grade obstruction. Degenerative changes in the spine. IMPRESSION: Contrast material is demonstrated in the dilated small bowel but not in the colon consistent with high-grade obstruction. Electronically Signed   By: Burman Nieves M.D.   On: 05/04/2018 01:55   Dg Abd Portable 1v-small Bowel Obstruction Protocol-initial, 8 Hr Delay  Result Date: 05/01/2018 CLINICAL DATA:  Small bowel obstruction. EXAM: PORTABLE ABDOMEN - 1 VIEW COMPARISON:  Radiograph earlier this day at 1109 hour, CT yesterday. FINDINGS: Enteric tube in place with tip in the stomach, side-port just beyond the gastroesophageal junction. Improving small bowel dilatation in the left abdomen. Moderate stool in the right and transverse colon. No evidence of free air. Pelvic calcification may be stone in the urinary bladder or exophytic prostate  calcification. IMPRESSION: Improving small bowel dilatation in the left abdomen since radiograph earlier this day. Electronically Signed   By: Narda Rutherford M.D.   On: 05/01/2018 21:14   Dg Abd Portable  1v-small Bowel Protocol-position Verification  Result Date: 05/01/2018 CLINICAL DATA:  Nasogastric tube placement EXAM: PORTABLE ABDOMEN - 1 VIEW COMPARISON:  Portable exam 1109 hours compared to 04/30/2018 FINDINGS: Tip of nasogastric tube projects over mid stomach. Dilated small bowel loops are seen in the LEFT mid abdomen. Increased stool in RIGHT colon. Lung bases appear emphysematous with subsegmental atelectasis at LEFT base. Bones demineralized. IMPRESSION: Tip of nasogastric tube projects over mid stomach. Small bowel dilatation in LEFT mid abdomen with increased stool in RIGHT colon. Electronically Signed   By: Ulyses Southward M.D.   On: 05/01/2018 11:22   Dg Abd Portable 1v  Result Date: 04/30/2018 CLINICAL DATA:  67 year old male with vomiting. EXAM: PORTABLE ABDOMEN - 1 VIEW COMPARISON:  04/29/2018 abdominal radiographs. FINDINGS: Portable AP semi upright and supine views at 0847 hours. Enteric tube has been placed and the stomach now appears decompressed. NG tube side hole at the gastric body. Non obstructed bowel gas pattern. Moderate volume of retained stool redemonstrated in the transverse colon and distal sigmoid/rectum. Negative visible lung bases. No pneumoperitoneum. Stable abdominal and pelvic visceral contours. Dystrophic calcification in the pelvis. Stable pelvic catheter, likely a Foley. No acute osseous abnormality identified. IMPRESSION: 1. NG tube in place with resolved gastric distention. 2. Non obstructed bowel gas pattern with moderate volume of retained stool. Electronically Signed   By: Odessa Fleming M.D.   On: 04/30/2018 09:06   Dg Abd Portable 1v  Result Date: 04/29/2018 CLINICAL DATA:  Nausea and vomiting EXAM: PORTABLE ABDOMEN - 1 VIEW COMPARISON:  None. FINDINGS: Gaseous  distention of the stomach. Formed stool throughout the colon. No concerning mass effect or calcification. IMPRESSION: Prominent gaseous distension of the stomach. Constipated appearance. Electronically Signed   By: Marnee Spring M.D.   On: 04/29/2018 14:44   [place] Ekg Site Rite  Result Date: 05/04/2018 If Site Rite image not attached, placement could not be confirmed due to current cardiac rhythm.    LOS: 13 days   Signature  Susa Raring M.D on 05/12/2018 at 11:02 AM  To page go to [email or web address] - password [facility]

## 2018-05-12 NOTE — Progress Notes (Signed)
This nurse received a call from telemetry stating that the Sister was taking pictures of the Vital sign monitor at the patient bedside.

## 2018-05-12 NOTE — Progress Notes (Signed)
Patients sister called for update about patient asking specifically about patients BP being low on previous shift. Pt's sisters specifically asked if patient has received a bolus. Informed patient that the doctor had not order a bolus but patients BP was imporving. Pt's sister stated that if nothing had been done about patients BP by the time she came in she would bring her attorney, Chiropodist or nursing and patient satisfaction into the room. Informed Charge nurse and Dr. Thedore Mins of patients BP and what the sister had stated. Per Dr. Thedore Mins patients BP is low while he is sleeping and continue to monitor patient.

## 2018-05-12 NOTE — Progress Notes (Signed)
[facility] Progress Note  2 Days Post-Op  Subjective: CC:  Patient is resting comfortably. His speech is soft and hard to understand this AM. Patient reports some pain. Unable to report if he is having flatus/BM. No family at bedside.   Objective: Vital signs in last 24 hours: Temp:  [97.8 F (36.6 C)-98.5 F (36.9 C)] 97.8 F (36.6 C) (10/17 0400) Pulse Rate:  [88-118] 118 (10/17 0800) Resp:  [16-25] 18 (10/17 0800) BP: (91-124)/(61-83) 91/61 (10/17 0800) SpO2:  [85 %-99 %] 98 % (10/17 0800) Last BM Date: 05/04/18  Intake/Output from previous day: 10/16 0701 - 10/17 0700 In: 1427.1 [I.V.:1006.2; IV Piggyback:420.9] Out: 1475 [Urine:1475] Intake/Output this shift: No intake/output data recorded.  PE: Gen:  Alert, NAD, cooperative, chronically ill appearing  Card:  Regular rate and rhythm Pulm:  Normal effort Abd: Soft, non-tender, non-distended, honeycomb dressing in place - dry, hypoactive BS  NG in place - about 400 cc in cannister, light green bile  Skin: warm and dry, no rashes  Psych: A&Ox3   Lab Results:  Recent Labs    05/10/18 1038 05/11/18 0435  WBC  --  12.6*  HGB 8.5* 7.8*  HCT 25.0* 24.4*  PLT  --  604*   BMET Recent Labs    05/11/18 0435 05/12/18 0336  NA 135 129*  K 4.8 4.9  CL 108 102  CO2 23 23  GLUCOSE 101* 96  BUN 27* 23  CREATININE 0.66 0.62  CALCIUM 8.6* 8.5*   PT/INR No results for input(s): LABPROT, INR in the last 72 hours. CMP     Component Value Date/Time   NA 129 (L) 05/12/2018 0336   K 4.9 05/12/2018 0336   CL 102 05/12/2018 0336   CO2 23 05/12/2018 0336   GLUCOSE 96 05/12/2018 0336   BUN 23 05/12/2018 0336   CREATININE 0.62 05/12/2018 0336   CALCIUM 8.5 (L) 05/12/2018 0336   PROT 6.2 (L) 05/12/2018 0336   ALBUMIN 2.6 (L) 05/12/2018 0336   AST 17 05/12/2018 0336   ALT 11 05/12/2018 0336   ALKPHOS 88 05/12/2018 0336   BILITOT 0.5 05/12/2018 0336   GFRNONAA >60 05/12/2018 0336   GFRAA >60 05/12/2018  0336   Lipase     Component Value Date/Time   LIPASE 204 (H) 04/29/2018 0904       Studies/Results: Dg Chest Port 1 View  Result Date: 05/11/2018 CLINICAL DATA:  Shortness of breath, hypertension, pneumonia, dementia, former smoker EXAM: PORTABLE CHEST 1 VIEW COMPARISON:  Portable exam 0945 hours compared to 05/09/2018 FINDINGS: Nasogastric tube extends into stomach. RIGHT arm PICC line tip projects over SVC. Normal heart size and pulmonary vascularity. Elongation of thoracic aorta. Lungs appear emphysematous but clear. No pleural effusion or pneumothorax. Bones demineralized. Free air identified under the hemidiaphragms bilaterally; EHR indicates patient underwent a laparoscopic procedure on 05/10/2018, small-bowel resection and lysis of adhesions for small-bowel obstruction prior operative note. IMPRESSION: COPD changes without infiltrate. Free air consistent with abdominal surgery 1 day ago. Electronically Signed   By: Ulyses Southward M.D.   On: 05/11/2018 10:21    Anti-infectives: Anti-infectives (From admission, onward)   Start     Dose/Rate Route Frequency Ordered Stop   05/10/18 0941  ceFAZolin (ANCEF) 2-4 GM/100ML-% IVPB    Note to Pharmacy:  [name]   : cabinet override      05/10/18 0941 05/10/18 2144   05/01/18 1215  metroNIDAZOLE (FLAGYL) IVPB 500 mg  Status:  Discontinued  500 mg 100 mL/hr over 60 Minutes Intravenous Every 8 hours 05/01/18 1201 05/06/18 1153   04/30/18 1100  cefTRIAXone (ROCEPHIN) 2 g in sodium chloride 0.9 % 100 mL IVPB  Status:  Discontinued     2 g 200 mL/hr over 30 Minutes Intravenous Every 24 hours 04/29/18 1416 05/06/18 1153   04/29/18 1430  cefTRIAXone (ROCEPHIN) 1 g in sodium chloride 0.9 % 100 mL IVPB     1 g 200 mL/hr over 30 Minutes Intravenous Every 24 hours 04/29/18 1416 04/30/18 1429   04/29/18 1130  cefTRIAXone (ROCEPHIN) 1 g in sodium chloride 0.9 % 100 mL IVPB     1 g 200 mL/hr over 30 Minutes Intravenous  Once 04/29/18 1118  04/29/18 1251       Assessment/Plan Aspiration Pneumonia Hx of prior lung surgery Acute metabolic encephalopathy Anemia AKI Hypertension Hypokalemia/Hyponatremia Hx of schizophrenia Deconditioning - bedbound Malnutrition - severe 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  - mobilize and OOB   ID -rocephin 10/4>>10/11, flagyl 10/6>>10/11 FEN -IVF, NPO/NGT, TPN VTE -SCDs, SQ heparin Foley -none   LOS: 13 days    Hosie Spangle, [facility] Pager: [phone]

## 2018-05-13 ENCOUNTER — Inpatient Hospital Stay (HOSPITAL_COMMUNITY): Payer: Medicare Other

## 2018-05-13 LAB — CBC
HCT: 24 % — ABNORMAL LOW (ref 39.0–52.0)
Hemoglobin: 7.4 g/dL — ABNORMAL LOW (ref 13.0–17.0)
MCH: 29.4 pg (ref 26.0–34.0)
MCHC: 30.8 g/dL (ref 30.0–36.0)
MCV: 95.2 fL (ref 80.0–100.0)
NRBC: 0 % (ref 0.0–0.2)
PLATELETS: 587 10*3/uL — AB (ref 150–400)
RBC: 2.52 MIL/uL — AB (ref 4.22–5.81)
RDW: 15.3 % (ref 11.5–15.5)
WBC: 14.7 10*3/uL — ABNORMAL HIGH (ref 4.0–10.5)

## 2018-05-13 LAB — BASIC METABOLIC PANEL
ANION GAP: 9 (ref 5–15)
BUN: 20 mg/dL (ref 8–23)
CHLORIDE: 103 mmol/L (ref 98–111)
CO2: 20 mmol/L — ABNORMAL LOW (ref 22–32)
Calcium: 8.7 mg/dL — ABNORMAL LOW (ref 8.9–10.3)
Creatinine, Ser: 0.73 mg/dL (ref 0.61–1.24)
GFR calc Af Amer: >60 mL/min (ref >60)
GLUCOSE: 106 mg/dL — AB (ref 70–99)
POTASSIUM: 4.1 mmol/L (ref 3.5–5.1)
Sodium: 132 mmol/L — ABNORMAL LOW (ref 135–145)

## 2018-05-13 LAB — GLUCOSE, CAPILLARY
GLUCOSE-CAPILLARY: 100 mg/dL — AB (ref 70–99)
Glucose-Capillary: 104 mg/dL — ABNORMAL HIGH (ref 70–99)
Glucose-Capillary: 98 mg/dL (ref 70–99)

## 2018-05-13 LAB — PROTIME-INR
INR: 1.39
Prothrombin Time: 17 seconds — ABNORMAL HIGH (ref 11.4–15.2)

## 2018-05-13 LAB — MAGNESIUM: Magnesium: 1.9 mg/dL (ref 1.7–2.4)

## 2018-05-13 MED ORDER — SODIUM CHLORIDE 0.9 % IV BOLUS
500.0000 mL | Freq: Once | INTRAVENOUS | Status: AC
  Administered 2018-05-13: 500 mL via INTRAVENOUS

## 2018-05-13 MED ORDER — TRAVASOL 10 % IV SOLN
INTRAVENOUS | Status: AC
  Administered 2018-05-13: 18:00:00 via INTRAVENOUS
  Filled 2018-05-13: qty 936

## 2018-05-13 MED ORDER — ENSURE ENLIVE PO LIQD
237.0000 mL | Freq: Two times a day (BID) | ORAL | Status: DC
  Administered 2018-05-13 – 2018-05-14 (×2): 237 mL via ORAL

## 2018-05-13 NOTE — Progress Notes (Signed)
3 Days Post-Op   Subjective/Chief Complaint: Pt doing well with liquids Passing flatus   Objective: Vital signs in last 24 hours: Temp:  [97.9 F (36.6 C)-98.7 F (37.1 C)] 97.9 F (36.6 C) (10/17 1202) Pulse Rate:  [99-102] 99 (10/17 1608) Resp:  [16-18] 17 (10/17 1608) BP: (83-115)/(53-69) 91/53 (10/17 1608) SpO2:  [97 %-100 %] 97 % (10/18 0553) Last BM Date: 05/04/18  Intake/Output from previous day: 10/17 0701 - 10/18 0700 In: 2816.4 [P.O.:80; I.V.:1557.5; IV Piggyback:1178.9] Out: 4150 [Urine:3750; Emesis/NG output:400] Intake/Output this shift: No intake/output data recorded.  General appearance: alert and cooperative GI: soft, non-tender; bowel sounds normal; no masses,  no organomegaly and inc c/d/i  Lab Results:  Recent Labs    05/12/18 1256 05/13/18 0407  WBC 14.4* 14.7*  HGB 7.6* 7.4*  HCT 24.3* 24.0*  PLT 566* 587*   BMET Recent Labs    05/12/18 0336 05/13/18 0407  NA 129* 132*  K 4.9 4.1  CL 102 103  CO2 23 20*  GLUCOSE 96 106*  BUN 23 20  CREATININE 0.62 0.73  CALCIUM 8.5* 8.7*   PT/INR Recent Labs    05/13/18 0811  LABPROT 17.0*  INR 1.39   ABG Recent Labs    05/10/18 1038 05/11/18 0948  PHART 7.257* 7.401  HCO3 26.5 22.6    Studies/Results: Dg Abd Portable 1v  Result Date: 05/13/2018 CLINICAL DATA:  Abdominal pain, check NG placement EXAM: PORTABLE ABDOMEN - 1 VIEW COMPARISON:  05/09/2018 FINDINGS: Nasogastric catheter is noted with the tip in the stomach. Proximal side port lies in the distal esophagus. This is stable from the prior exam. Scattered large and small bowel gas is noted. Postsurgical changes are now seen. Mild small bowel dilatation is noted which may be related to a postoperative ileus. IMPRESSION: Mild postoperative ileus. Nasogastric catheter as described. Electronically Signed   By: Alcide Clever M.D.   On: 05/13/2018 09:56    Anti-infectives: Anti-infectives (From admission, onward)   Start     Dose/Rate  Route Frequency Ordered Stop   05/10/18 0941  ceFAZolin (ANCEF) 2-4 GM/100ML-% IVPB    Note to Pharmacy:  [name]   : cabinet override      05/10/18 0941 05/10/18 2144   05/01/18 1215  metroNIDAZOLE (FLAGYL) IVPB 500 mg  Status:  Discontinued     500 mg 100 mL/hr over 60 Minutes Intravenous Every 8 hours 05/01/18 1201 05/06/18 1153   04/30/18 1100  cefTRIAXone (ROCEPHIN) 2 g in sodium chloride 0.9 % 100 mL IVPB  Status:  Discontinued     2 g 200 mL/hr over 30 Minutes Intravenous Every 24 hours 04/29/18 1416 05/06/18 1153   04/29/18 1430  cefTRIAXone (ROCEPHIN) 1 g in sodium chloride 0.9 % 100 mL IVPB     1 g 200 mL/hr over 30 Minutes Intravenous Every 24 hours 04/29/18 1416 04/30/18 1429   04/29/18 1130  cefTRIAXone (ROCEPHIN) 1 g in sodium chloride 0.9 % 100 mL IVPB     1 g 200 mL/hr over 30 Minutes Intravenous  Once 04/29/18 1118 04/29/18 1251      Assessment/Plan: s/p Procedure(s): LAPAROSCOPY DIAGNOSTIC (N/A) LAPAROSCOPIC LYSIS OF ADHESIONS (N/A) SMALL BOWEL RESECTION (N/A) DC NGt Adv to fulls Awaiting bowel function Mobilize as tol   LOS: 14 days    Jeffrey Davila 05/13/2018

## 2018-05-13 NOTE — Progress Notes (Signed)
PHARMACY - ADULT TOTAL PARENTERAL NUTRITION CONSULT NOTE   Pharmacy Consult for TPN Indication: Bowel obstruction  Patient Measurements: Height: 5\' 6"  (167.6 cm) Weight: 110 lb (49.9 kg) IBW/kg (Calculated) : 63.8 TPN AdjBW (KG): 49.9 Body mass index is 17.75 kg/m.  Assessment:  37 YOM admitted on 10/4 with sepsis. He was found to have pneumonia and a possible SBO on CT on 10/5. Pt has a history of SBO s/p colectomy in 1993 (history (in the 1990s) of trauma causing small bowel injury requiring laparotomy). An NG tube was placed with improving small bowel dilatation but patient had 3-4 episodes of vomiting overnight on 10/7-10/8 and the NG tube had to be replaced due to incorrect placement. Pharmacy now consulted to manage TPN due to worsening SBO and concern for adhesions that require surgery.  Actual body is significantly below ideal body weight.  Of note, patient is extremely cachectic.  GI: s/p ex-lap with LOA, SBR and anastomosis on 10/15.  Prealbumin improved to 17.5, NG output . No stool output noted since 10/9. - Clear liquid diet: Only 80ml noted  Endo: No h/o DM - CBGs WNL, SSI d/c'ed 05/09/18. Lytes: Na 129>132, K 4.9>4.1, Mg 1.9 ok, AdjCa 9.82  Renal: SCr stable, UOP good, LR at 10 ml/hr Pulm: 5L [place]  Cards: VSS  Hepatobil: LFTs wnl, Lipase elevated 10/4, TG WNL, albumin 2.6  Neuro: Schizophrenia. Marland Kitchen benztropine, Zyprexa ODT, IV valproate (f/u change to po) ID: s/p abx for Klebsiella UTI and aspiration PNA. Afebrile, WBC 14.7 up TPN Access: double lumen PICC placed 10/10 TPN start date: 05/05/18  Nutritional Goals (per RD rec on 10/14): 1550-1750kCal, 85-100 gm protein, >1.5 L fluid per day  Current Nutrition:  TPN  Plan:  - No changes to TPN 10/18 Continue TPN at 65 mL/hr, providing 94 g of protein, 218 g of dextrose, and 55 g of lipids for a total of 1,663 kCals per day, meeting 100% of patient needs Electrolytes in TPN: Increased Na Add MVI, trace elements  to TPN F/U AM labs F/u plans for advancement of diet   Shylyn Younce S. Merilynn Finland, PharmD, BCPS Clinical Staff Pharmacist [phone] 05/13/2018 8:12 AM

## 2018-05-13 NOTE — Progress Notes (Signed)
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 M.D.   On: 05/04/2018 01:55   Dg Abd Portable 1v-small Bowel Obstruction Protocol-initial, 8 Hr Delay  Result Date: 05/01/2018 CLINICAL DATA:  Small bowel obstruction. EXAM: PORTABLE ABDOMEN - 1 VIEW COMPARISON:  Radiograph earlier this day at 1109 hour, CT yesterday. FINDINGS: Enteric tube in place with tip in the stomach, side-port just beyond the gastroesophageal junction. Improving small bowel dilatation in the left abdomen. Moderate stool in the right and transverse colon. No evidence of free air. Pelvic calcification may be stone in the urinary bladder or exophytic prostate calcification. IMPRESSION: Improving small bowel dilatation in the left abdomen since radiograph earlier this day. Electronically Signed   By: Narda Rutherford M.D.   On: 05/01/2018 21:14   Dg Abd Portable 1v-small Bowel Protocol-position Verification  Result Date: 05/01/2018 CLINICAL DATA:  Nasogastric tube placement EXAM: PORTABLE ABDOMEN - 1 VIEW COMPARISON:  Portable exam 1109 hours compared  to 04/30/2018 FINDINGS: Tip of nasogastric  tube projects over mid stomach. Dilated small bowel loops are seen in the LEFT mid abdomen. Increased stool in RIGHT colon. Lung bases appear emphysematous with subsegmental atelectasis at LEFT base. Bones demineralized. IMPRESSION: Tip of nasogastric tube projects over mid stomach. Small bowel dilatation in LEFT mid abdomen with increased stool in RIGHT colon. Electronically Signed   By: Ulyses Southward M.D.   On: 05/01/2018 11:22   Dg Abd Portable 1v  Result Date: 04/30/2018 CLINICAL DATA:  67 year old male with vomiting. EXAM: PORTABLE ABDOMEN - 1 VIEW COMPARISON:  04/29/2018 abdominal radiographs. FINDINGS: Portable AP semi upright and supine views at 0847 hours. Enteric tube has been placed and the stomach now appears decompressed. NG tube side hole at the gastric body. Non obstructed bowel gas pattern. Moderate volume of retained stool redemonstrated in the transverse colon and distal sigmoid/rectum. Negative visible lung bases. No pneumoperitoneum. Stable abdominal and pelvic visceral contours. Dystrophic calcification in the pelvis. Stable pelvic catheter, likely a Foley. No acute osseous abnormality identified. IMPRESSION: 1. NG tube in place with resolved gastric distention. 2. Non obstructed bowel gas pattern with moderate volume of retained stool. Electronically Signed   By: Odessa Fleming M.D.   On: 04/30/2018 09:06   Dg Abd Portable 1v  Result Date: 04/29/2018 CLINICAL DATA:  Nausea and vomiting EXAM: PORTABLE ABDOMEN - 1 VIEW COMPARISON:  None. FINDINGS: Gaseous distention of the stomach. Formed stool throughout the colon. No concerning mass effect or calcification. IMPRESSION: Prominent gaseous distension of the stomach. Constipated appearance. Electronically Signed   By: Marnee Spring M.D.   On: 04/29/2018 14:44   [place] Ekg Site Rite  Result Date: 05/04/2018 If Site Rite image not attached, placement could not be confirmed due to current cardiac  rhythm.    LOS: 14 days   Signature  Susa Raring M.D on 05/13/2018 at 9:42 AM  To page go to [email or web address] - password [email or web address]

## 2018-05-13 NOTE — Progress Notes (Signed)
Physical Therapy Treatment Patient Details Name: Jeffrey Davila MRN: [id number] DOB: [birth year] Today's Date: 05/13/2018    History of Present Illness Pt is a 67 y/o male admitted secondary to AMS and Sepsis most likely secondary to UTI. CT negative for acute abnormality. Work up during hospitalizations shows, aspiration PNA, SBO, acute metabolic encephalopathy, anemia, AKI, severe malnutrition, deconditioning/debility, and hyponatremia.  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  PMH inlcudes BI, dementia, HTN, and schizophrenia.     PT Comments    Pt performed seated balance followed by transfer to chair.  Pt remains limited in progression based on behavior and cognition.  He did seem more recpetive with his sister present.  Plan next session for PT follow up to assess POC.      Follow Up Recommendations  SNF;Supervision/Assistance - 24 hour     Equipment Recommendations  Other (comment)(TBA further.)    Recommendations for Other Services       Precautions / Restrictions Precautions Precautions: Fall Precaution Comments: NG tube Restrictions Weight Bearing Restrictions: No    Mobility  Bed Mobility Overal bed mobility: Needs Assistance Bed Mobility: Rolling;Sidelying to Sit Rolling: +2 for safety/equipment;Max assist Sidelying to sit: +2 for safety/equipment;Max assist       General bed mobility comments: Pt required assistance to elevate trunk into sitting and advance LEs to edge of bed.   Pt is limited to participation based on his behavior and cognitive deficits.    Transfers Overall transfer level: Needs assistance Equipment used: None;2 person hand held assist Transfers: Sit to/from [employer] Sit to Stand: Min assist;+2 safety/equipment Stand pivot transfers: Mod assist;+2 safety/equipment       General transfer comment: Min A for power up into standing and then Mod A for safety  and balance. flexed posture throughout  Ambulation/Gait Ambulation/Gait assistance: (NT just shuffling steps from bed to chair.  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  1x10 reps.     General Comments        Pertinent Vitals/Pain Pain Assessment: Faces Faces Pain Scale: No hurt    Home Living                      Prior Function            PT Goals (current goals can now be found in the care plan section) Acute Rehab PT Goals Patient Stated Goal: Sister "to go to rehab and then home." Potential to Achieve Goals: Fair Progress towards  PT goals: Progressing toward goals    Frequency    Min 2X/week      PT Plan Current plan remains appropriate    Co-evaluation              AM-PAC PT "6 Clicks" Daily Activity  Outcome Measure  Difficulty turning over in bed (including adjusting bedclothes, sheets and blankets)?: Unable Difficulty moving from lying on back to sitting on the side of the bed? : Unable Difficulty sitting down on and standing up from a chair with arms (e.g., wheelchair, bedside commode, etc,.)?: Unable Help needed moving to and from a bed to chair (including a wheelchair)?: A Little Help needed walking in hospital room?: A Little Help needed climbing 3-5 steps with a railing? : A Little 6 Click Score: 12    End of Session Equipment  Utilized During Treatment: Gait belt Activity Tolerance: Patient tolerated treatment well Patient left: in chair;with call bell/phone within reach;with family/visitor present;with chair alarm set Nurse Communication: Mobility status PT Visit Diagnosis: Unsteadiness on feet (R26.81);Muscle weakness (generalized) (M62.81);Difficulty in walking, not elsewhere classified (R26.2)     Time: 1610-9604 PT Time Calculation (min) (ACUTE ONLY): 21 min  Charges:  $Therapeutic Activity: 8-22 mins                     Jeffrey Davila, PTA Acute Rehabilitation Services Pager [phone] Office [phone]     Jeffrey Davila [name] 05/13/2018, 1:49 PM

## 2018-05-14 ENCOUNTER — Inpatient Hospital Stay (HOSPITAL_COMMUNITY): Payer: Medicare Other

## 2018-05-14 LAB — CBC
HCT: 22.4 % — ABNORMAL LOW (ref 39.0–52.0)
HEMOGLOBIN: 7 g/dL — AB (ref 13.0–17.0)
MCH: 29.8 pg (ref 26.0–34.0)
MCHC: 31.3 g/dL (ref 30.0–36.0)
MCV: 95.3 fL (ref 80.0–100.0)
NRBC: 0 % (ref 0.0–0.2)
PLATELETS: 576 10*3/uL — AB (ref 150–400)
RBC: 2.35 MIL/uL — AB (ref 4.22–5.81)
RDW: 15.5 % (ref 11.5–15.5)
WBC: 10.4 10*3/uL (ref 4.0–10.5)

## 2018-05-14 LAB — GLUCOSE, CAPILLARY
GLUCOSE-CAPILLARY: 106 mg/dL — AB (ref 70–99)
GLUCOSE-CAPILLARY: 108 mg/dL — AB (ref 70–99)
GLUCOSE-CAPILLARY: 110 mg/dL — AB (ref 70–99)
GLUCOSE-CAPILLARY: 89 mg/dL (ref 70–99)
GLUCOSE-CAPILLARY: 92 mg/dL (ref 70–99)
Glucose-Capillary: 98 mg/dL (ref 70–99)

## 2018-05-14 MED ORDER — TRAVASOL 10 % IV SOLN
INTRAVENOUS | Status: AC
  Administered 2018-05-14: 17:00:00 via INTRAVENOUS
  Filled 2018-05-14: qty 936

## 2018-05-14 NOTE — Progress Notes (Signed)
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 Signed   By: Alcide Clever M.D.   On: 05/08/2018 08:36   Dg Abd Portable 1v  Result Date: 05/06/2018 CLINICAL DATA:  Small bowel obstruction EXAM: PORTABLE ABDOMEN - 1 VIEW COMPARISON:  Portable exam at 0628 hours compared to 05/05/2018 FINDINGS: Stool scattered throughout colon. Persistent dilatation of small bowel loops in the LEFT mid abdomen up to 4.9 cm diameter. No bowel wall thickening. Bones demineralized. No definite renal calcifications. IMPRESSION: Persistent small bowel dilatation consistent with small bowel obstruction. Little interval change. Electronically Signed   By: Ulyses Southward M.D.   On: 05/06/2018 10:21   Dg Abd Portable 1v-small Bowel Obstruction Protocol-initial, 8 Hr Delay  Result Date: 05/04/2018 CLINICAL DATA:  Small bowel obstruction protocol. 8 hour delay. EXAM: PORTABLE ABDOMEN - 1 VIEW COMPARISON:  05/03/2018 FINDINGS: Gaseous distention of left upper quadrant small bowel. Enteric tube with tip projected over the mid  abdomen consistent with location in the upper stomach. Contrast material is  demonstrated in the dilated small bowel. No contrast material is identified in the colon. This is suggestive of high-grade obstruction. Degenerative changes in the spine. IMPRESSION: Contrast material is demonstrated in the dilated small bowel but not in the colon consistent with high-grade obstruction. Electronically Signed   By: Burman Nieves M.D.   On: 05/04/2018 01:55   Dg Abd Portable 1v-small Bowel Obstruction Protocol-initial, 8 Hr Delay  Result Date: 05/01/2018 CLINICAL DATA:  Small bowel obstruction. EXAM: PORTABLE ABDOMEN - 1 VIEW COMPARISON:  Radiograph earlier this day at 1109 hour, CT yesterday. FINDINGS: Enteric tube in place with tip in the stomach, side-port just beyond the gastroesophageal junction. Improving small bowel dilatation in the left abdomen. Moderate stool in the right and transverse colon. No evidence of free air. Pelvic calcification may be stone in the urinary bladder or exophytic prostate calcification. IMPRESSION: Improving small bowel dilatation in the left abdomen since radiograph earlier this day. Electronically Signed   By: Narda Rutherford M.D.   On: 05/01/2018 21:14   Dg Abd Portable 1v-small Bowel Protocol-position Verification  Result Date: 05/01/2018 CLINICAL DATA:  Nasogastric tube placement EXAM: PORTABLE ABDOMEN - 1 VIEW COMPARISON:  Portable exam 1109 hours compared to 04/30/2018 FINDINGS: Tip of nasogastric tube projects over mid stomach. Dilated small bowel loops are seen in the LEFT mid abdomen. Increased stool in RIGHT colon. Lung bases appear emphysematous with subsegmental atelectasis at LEFT base. Bones demineralized. IMPRESSION: Tip of nasogastric tube projects over mid stomach. Small bowel dilatation in LEFT mid abdomen with increased stool in RIGHT colon. Electronically Signed   By: Ulyses Southward M.D.   On: 05/01/2018 11:22   Dg Abd Portable 1v  Result Date:  04/30/2018 CLINICAL DATA:  67 year old male with vomiting. EXAM: PORTABLE ABDOMEN - 1 VIEW COMPARISON:  04/29/2018 abdominal radiographs. FINDINGS: Portable AP semi upright and supine views at 0847 hours. Enteric tube has been placed and the stomach now appears decompressed. NG tube side hole at the gastric body. Non obstructed bowel gas pattern. Moderate volume of retained stool redemonstrated in the transverse colon and distal sigmoid/rectum. Negative visible lung bases. No pneumoperitoneum. Stable abdominal and pelvic visceral contours. Dystrophic calcification in the pelvis. Stable pelvic catheter, likely a Foley. No acute osseous abnormality identified. IMPRESSION: 1. NG tube in place with resolved gastric distention. 2. Non obstructed bowel gas pattern with moderate volume of retained stool. Electronically Signed   By: Odessa Fleming M.D.   On: 04/30/2018 09:06   Dg Abd Portable 1v  Result Date: 04/29/2018 CLINICAL DATA:  Nausea and vomiting EXAM: PORTABLE ABDOMEN - 1 VIEW COMPARISON:  None. FINDINGS: Gaseous distention of the stomach. Formed stool throughout the colon. No concerning mass effect or calcification. IMPRESSION: Prominent gaseous distension of the stomach. Constipated appearance. Electronically Signed   By: Marnee Spring M.D.   On: 04/29/2018 14:44   [place] Ekg Site Rite  Result Date: 05/04/2018 If Site Rite image not attached, placement could not be confirmed due to current cardiac rhythm.    LOS: 15 days   Signature  Susa Raring M.D on 05/14/2018 at 11:03 AM  To page go to [email or web address] - password [facility]

## 2018-05-14 NOTE — Progress Notes (Signed)
PHARMACY - ADULT TOTAL PARENTERAL NUTRITION CONSULT NOTE   Pharmacy Consult for TPN Indication: Bowel obstruction  Patient Measurements: Height: 5\' 6"  (167.6 cm) Weight: 110 lb (49.9 kg) IBW/kg (Calculated) : 63.8 TPN AdjBW (KG): 49.9 Body mass index is 17.75 kg/m.  Assessment:  45 YOM admitted on 10/4 with sepsis. He was found to have pneumonia and a possible SBO on CT on 10/5. Pt has a history of SBO s/p colectomy in 1993 (history (in the 1990s) of trauma causing small bowel injury requiring laparotomy). An NG tube was placed with improving small bowel dilatation but patient had 3-4 episodes of vomiting overnight on 10/7-10/8 and the NG tube had to be replaced due to incorrect placement. Pharmacy now consulted to manage TPN due to worsening SBO and concern for adhesions that require surgery.  Actual body is significantly below ideal body weight.  Of note, patient is extremely cachectic.  GI: s/p ex-lap with LOA, SBR and anastomosis on 10/15.  Prealbumin improved to 17.5, NG output none. No stool output noted since 10/9. - Full liquid diet: None documented last 24 hrs  Endo: No h/o DM - CBGs WNL, SSI d/c'ed 05/09/18.  Lytes: Na 129>132, K 4.9>4.1, Mg 1.9 ok, AdjCa 9.82   Renal: SCr stable, UOP good, LR at 10 ml/hr  Pulm: 2L [place]  Down  Cards: VSS  Hepatobil: LFTs wnl, Lipase elevated 10/4, TG WNL, albumin 2.6  Neuro: Schizophrenia. Dementia. benztropine, Zyprexa ODT, IV valproate (f/u change to po)  ID: s/p abx for Klebsiella UTI and aspiration PNA. Afebrile, WBC 10.4  Prophx: SQ heparin. Hgb down to 7 today  TPN Access: double lumen PICC placed 10/10 TPN start date: 05/05/18  Nutritional Goals (per RD rec on 10/14): 1550-1750kCal, 85-100 gm protein, >1.5 L fluid per day  Current Nutrition:  TPN  Plan:  - No changes to TPN 10/19 Continue TPN at 65 mL/hr, providing 94 g of protein, 218 g of dextrose, and 55 g of lipids for a total of 1,663 kCals per day, meeting 100% of  patient needs Electrolytes in TPN: Increased Na Add MVI, trace elements to TPN F/U AM labs F/u intake and toleration of diet   Nishat Livingston S. Merilynn Finland, PharmD, [facility] Clinical Staff Pharmacist [phone] 05/14/2018 7:56 AM

## 2018-05-14 NOTE — Progress Notes (Addendum)
4 Days Post-Op    CC:SBO  Subjective: NG out on full liquids.   He says he's throwing up.  No on in the room, staff does not know.  No BM.  I am going to put him back on clears.  No BS, incision is OK.  Objective: Vital signs in last 24 hours: Temp:  [97.9 F (36.6 C)-99.7 F (37.6 C)] 98.6 F (37 C) (10/19 0800) Pulse Rate:  [105] 105 (10/18 1036) Resp:  [21] 21 (10/18 1036) SpO2:  [96 %-97 %] 97 % (10/18 1212) Last BM Date: 05/04/18 PO not recorded 832 IV recorded 2225 urine Afebrile, VSS WBC 10.4 H/H 7/22.4 Film yesterday:  ileus Intake/Output from previous day: 10/18 0701 - 10/19 0700 In: 1147.7 [I.V.:832.7; IV Piggyback:315] Out: 2225 [Urine:2225] Intake/Output this shift: Total I/O In: -  Out: 350 [Urine:350]  General appearance: alert, cooperative and no distress GI: soft, flat, no BS, can't tell if he has flatus, no BM  Lab Results:  Recent Labs    05/13/18 0407 05/14/18 0403  WBC 14.7* 10.4  HGB 7.4* 7.0*  HCT 24.0* 22.4*  PLT 587* 576*    BMET Recent Labs    05/12/18 0336 05/13/18 0407  NA 129* 132*  K 4.9 4.1  CL 102 103  CO2 23 20*  GLUCOSE 96 106*  BUN 23 20  CREATININE 0.62 0.73  CALCIUM 8.5* 8.7*   PT/INR Recent Labs    05/13/18 0811  LABPROT 17.0*  INR 1.39    Recent Labs  Lab 05/09/18 0412 05/12/18 0336  AST 7* 17  ALT 6 11  ALKPHOS 45 88  BILITOT 0.2* 0.5  PROT 5.9* 6.2*  ALBUMIN 2.2* 2.6*     Lipase     Component Value Date/Time   LIPASE 204 (H) 04/29/2018 0904     Medications: . benztropine  1 mg Oral BID  . Chlorhexidine Gluconate Cloth  6 each Topical 2 times per day on Tue  . feeding supplement (ENSURE ENLIVE)  237 mL Oral BID BM  . heparin  5,000 Units Subcutaneous Q8H  . lip balm  1 application Topical BID  . OLANZapine zydis  10 mg Oral BID  . polyvinyl alcohol  1 drop Both Eyes TID   Anti-infectives (From admission, onward)   Start     Dose/Rate Route Frequency Ordered Stop   05/10/18 0941   ceFAZolin (ANCEF) 2-4 GM/100ML-% IVPB    Note to Pharmacy:  [name]   : cabinet override      05/10/18 0941 05/10/18 2144   05/01/18 1215  metroNIDAZOLE (FLAGYL) IVPB 500 mg  Status:  Discontinued     500 mg 100 mL/hr over 60 Minutes Intravenous Every 8 hours 05/01/18 1201 05/06/18 1153   04/30/18 1100  cefTRIAXone (ROCEPHIN) 2 g in sodium chloride 0.9 % 100 mL IVPB  Status:  Discontinued     2 g 200 mL/hr over 30 Minutes Intravenous Every 24 hours 04/29/18 1416 05/06/18 1153   04/29/18 1430  cefTRIAXone (ROCEPHIN) 1 g in sodium chloride 0.9 % 100 mL IVPB     1 g 200 mL/hr over 30 Minutes Intravenous Every 24 hours 04/29/18 1416 04/30/18 1429   04/29/18 1130  cefTRIAXone (ROCEPHIN) 1 g in sodium chloride 0.9 % 100 mL IVPB     1 g 200 mL/hr over 30 Minutes Intravenous  Once 04/29/18 1118 04/29/18 1251      Assessment/Plan Aspiration Pneumonia Hx of prior lung surgery Acute metabolic encephalopathy Anemia AKI Hypertension  Hypokalemia/Hyponatremia Hx of schizophrenia Deconditioning - bedbound Malnutrition - severe 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  No once aware of him vomiting, or having a bowel movement.  Try to discourage sister from feeding him.    LOS: 15 days    Jeffrey Davila 05/14/2018 [phone]

## 2018-05-15 ENCOUNTER — Inpatient Hospital Stay (HOSPITAL_COMMUNITY): Payer: Medicare Other

## 2018-05-15 LAB — URINALYSIS, ROUTINE W REFLEX MICROSCOPIC
BILIRUBIN URINE: NEGATIVE
Bacteria, UA: NONE SEEN
GLUCOSE, UA: NEGATIVE mg/dL
Ketones, ur: NEGATIVE mg/dL
Nitrite: NEGATIVE
PROTEIN: NEGATIVE mg/dL
Specific Gravity, Urine: 1.013 (ref 1.005–1.030)
pH: 7 (ref 5.0–8.0)

## 2018-05-15 LAB — COMPREHENSIVE METABOLIC PANEL
ALT: 17 U/L (ref 0–44)
AST: 19 U/L (ref 15–41)
Albumin: 2 g/dL — ABNORMAL LOW (ref 3.5–5.0)
Alkaline Phosphatase: 119 U/L (ref 38–126)
Anion gap: 6 (ref 5–15)
BILIRUBIN TOTAL: 0.2 mg/dL — AB (ref 0.3–1.2)
BUN: 21 mg/dL (ref 8–23)
CALCIUM: 8.4 mg/dL — AB (ref 8.9–10.3)
CHLORIDE: 109 mmol/L (ref 98–111)
CO2: 23 mmol/L (ref 22–32)
Creatinine, Ser: 0.56 mg/dL — ABNORMAL LOW (ref 0.61–1.24)
Glucose, Bld: 109 mg/dL — ABNORMAL HIGH (ref 70–99)
Potassium: 3.8 mmol/L (ref 3.5–5.1)
Sodium: 138 mmol/L (ref 135–145)
TOTAL PROTEIN: 5.8 g/dL — AB (ref 6.5–8.1)

## 2018-05-15 LAB — CBC
HEMATOCRIT: 21.8 % — AB (ref 39.0–52.0)
Hemoglobin: 6.9 g/dL — CL (ref 13.0–17.0)
MCH: 30.1 pg (ref 26.0–34.0)
MCHC: 31.7 g/dL (ref 30.0–36.0)
MCV: 95.2 fL (ref 80.0–100.0)
PLATELETS: 586 10*3/uL — AB (ref 150–400)
RBC: 2.29 MIL/uL — AB (ref 4.22–5.81)
RDW: 15.7 % — ABNORMAL HIGH (ref 11.5–15.5)
WBC: 8 10*3/uL (ref 4.0–10.5)
nRBC: 0 % (ref 0.0–0.2)

## 2018-05-15 LAB — HEMOGLOBIN AND HEMATOCRIT, BLOOD
HEMATOCRIT: 28.7 % — AB (ref 39.0–52.0)
HEMOGLOBIN: 9.1 g/dL — AB (ref 13.0–17.0)

## 2018-05-15 LAB — GLUCOSE, CAPILLARY
GLUCOSE-CAPILLARY: 102 mg/dL — AB (ref 70–99)
GLUCOSE-CAPILLARY: 107 mg/dL — AB (ref 70–99)
GLUCOSE-CAPILLARY: 81 mg/dL (ref 70–99)
GLUCOSE-CAPILLARY: 99 mg/dL (ref 70–99)
Glucose-Capillary: 114 mg/dL — ABNORMAL HIGH (ref 70–99)
Glucose-Capillary: 109 mg/dL — ABNORMAL HIGH (ref 70–99)

## 2018-05-15 LAB — MAGNESIUM: MAGNESIUM: 1.9 mg/dL (ref 1.7–2.4)

## 2018-05-15 LAB — PREPARE RBC (CROSSMATCH)

## 2018-05-15 MED ORDER — FUROSEMIDE 10 MG/ML IJ SOLN
20.0000 mg | Freq: Once | INTRAMUSCULAR | Status: AC
  Administered 2018-05-15: 20 mg via INTRAVENOUS
  Filled 2018-05-15: qty 2

## 2018-05-15 MED ORDER — ACETAMINOPHEN 10 MG/ML IV SOLN
1000.0000 mg | Freq: Four times a day (QID) | INTRAVENOUS | Status: AC | PRN
  Administered 2018-05-16: 1000 mg via INTRAVENOUS
  Filled 2018-05-15 (×3): qty 100

## 2018-05-15 MED ORDER — ACETAMINOPHEN 650 MG RE SUPP
650.0000 mg | Freq: Four times a day (QID) | RECTAL | Status: DC | PRN
  Administered 2018-05-15: 650 mg via RECTAL
  Filled 2018-05-15: qty 1

## 2018-05-15 MED ORDER — TRAVASOL 10 % IV SOLN
INTRAVENOUS | Status: AC
  Administered 2018-05-15: 18:00:00 via INTRAVENOUS
  Filled 2018-05-15: qty 936

## 2018-05-15 MED ORDER — SODIUM CHLORIDE 0.9% IV SOLUTION
Freq: Once | INTRAVENOUS | Status: AC
  Administered 2018-05-15: 07:00:00 via INTRAVENOUS

## 2018-05-15 MED ORDER — MORPHINE SULFATE (PF) 2 MG/ML IV SOLN
2.0000 mg | INTRAVENOUS | Status: AC | PRN
  Administered 2018-05-15: 1 mg via INTRAVENOUS
  Administered 2018-05-16: 2 mg via INTRAVENOUS
  Filled 2018-05-15 (×2): qty 1

## 2018-05-15 NOTE — Progress Notes (Addendum)
Pt's sister, [name], has stated that she will sue the hospital and contact the better business bureau and report the staff from [facility] because "they don't do their jobs".    It has been explained to [name] numerous times what an ileus is, how surgery, NG tubes and nothing by mouth helps them to go away.  Yet, she insists we feed her brother, and she refuses to allow [place] to place an NG tube.    Dorothy insists on shaving her brother with a razor, even after being told that Marcell is at risk for bleeding because he is on Heparin.  Dorothy threatened to take brother out of here AMA and take him to [place], or [facility]. When presented with [facility] papers she refused to sign and said she would call [facility] and find an admitting doctor herself.  The correct process for transferring pt's has been explained to [name], along with [facility] refusal to transfer pt.   Nursing administrator, Chrys, is in the room taking with [name].  Avasys is in the room.

## 2018-05-15 NOTE — Progress Notes (Addendum)
Jeffrey Davila was given an ODT Zyprexa.  It did not dissolve immediately. Pt's sister, [name], gave pt water on an oral sponge, "because he needs something to drink." [name] was cautioned that we do not want to give [place] liquids or food by mouth because it could make his ileus worse.  The analogy of blowing up a balloon was given, [name] was able to say that if you put in too much it will pop and she understood that his intestines could do the same.   Earlier today, [name] said that "we" employees of [employer], are not doing our job and that she was going to sue [place] for not doing our job. Now she says she never said anyone's names of who wasn't doing their jobs.

## 2018-05-15 NOTE — Progress Notes (Addendum)
5 Days Post-Op     Subjective: He says yes to everything.  It is hard to gauge what is real and what is not with him.  He has good bowel sounds, he says he is passing flatus.  No bowel movement.  Nurses say he is spitting not really vomiting.  Objective: Vital signs in last 24 hours: Temp:  [98.3 F (36.8 C)-101.4 F (38.6 C)] 98.9 F (37.2 C) (10/20 0411) Pulse Rate:  [92-102] 94 (10/20 0334) Resp:  [14-24] 14 (10/20 0334) BP: (105-138)/(57-77) 138/67 (10/20 0334) SpO2:  [95 %-100 %] 99 % (10/20 0334) Last BM Date: (none since surgery per pt's sister)  PO 120 recorded 1219 IV 1350 urine No BM Temp up to 101.4 10/19 afebrile since that time Lytes are all good, H/H=  10/31 after transfusion 10/14 >> 6.9/21.8 today Intake/Output from previous day: 10/19 0701 - 10/20 0700 In: 1391.9 [P.O.:120; I.V.:1219.4; IV Piggyback:52.5] Out: 1350 [Urine:1350] Intake/Output this shift: No intake/output data recorded.  General appearance: alert and Difficult to gauge his answers are yes to pretty much everything today, when he will speak. Resp: clear to auscultation bilaterally GI: Soft, not distended, he has bowel sounds.  He reports flatus but no way to be sure.  No BM recorded.  Lab Results:  Recent Labs    05/14/18 0403 05/15/18 0419  WBC 10.4 8.0  HGB 7.0* 6.9*  HCT 22.4* 21.8*  PLT 576* 586*    BMET Recent Labs    05/13/18 0407 05/15/18 0419  NA 132* 138  K 4.1 3.8  CL 103 109  CO2 20* 23  GLUCOSE 106* 109*  BUN 20 21  CREATININE 0.73 0.56*  CALCIUM 8.7* 8.4*   PT/INR Recent Labs    05/13/18 0811  LABPROT 17.0*  INR 1.39    Recent Labs  Lab 05/09/18 0412 05/12/18 0336 05/15/18 0419  AST 7* 17 19  ALT 6 11 17   ALKPHOS 45 88 119  BILITOT 0.2* 0.5 0.2*  PROT 5.9* 6.2* 5.8*  ALBUMIN 2.2* 2.6* 2.0*     Lipase     Component Value Date/Time   LIPASE 204 (H) 04/29/2018 0904     Medications: . benztropine  1 mg Oral BID  . Chlorhexidine Gluconate  Cloth  6 each Topical 2 times per day on Tue  . feeding supplement (ENSURE ENLIVE)  237 mL Oral BID BM  . furosemide  20 mg Intravenous Once  . heparin  5,000 Units Subcutaneous Q8H  . lip balm  1 application Topical BID  . OLANZapine zydis  10 mg Oral BID  . polyvinyl alcohol  1 drop Both Eyes TID    Assessment/Plan  Aspiration Pneumonia Hx of prior lung surgery Acute metabolic encephalopathy Anemia - transfused 10/14 - 6.9/21 today Dr. 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 Thedore Mins plans to stop TNA today and try a  soft diet, see how he does. VTE -SCDs, SQ heparin Foley -none Plan: Saw him with Dr. Thedore Mins.  He would like to stop the TNA, give him a soft diet and see how he does.  He is planning to transfuse today, recheck a chest x-ray this a.m. for his fever.    CXR:  Mild left base opacification likely atelectasis or vascular crowding and less likely infection.  Persistent free peritoneal air compatible recent postop state AXR:  Similar appearance of distended large and small bowel consistent with obstruction or ileus.  Dr. Thedore Mins is making NPO and continuing TNA.  Sherrie George 05/15/2018 [phone]

## 2018-05-15 NOTE — Progress Notes (Signed)
Notified Dr. Thedore Mins of abd xray results.

## 2018-05-15 NOTE — Progress Notes (Signed)
PHARMACY - ADULT TOTAL PARENTERAL NUTRITION CONSULT NOTE   Pharmacy Consult for TPN Indication: Bowel obstruction  Patient Measurements: Height: 5\' 6"  (167.6 cm) Weight: 110 lb (49.9 kg) IBW/kg (Calculated) : 63.8 TPN AdjBW (KG): 49.9 Body mass index is 17.75 kg/m.  Assessment:  47 YOM admitted on 10/4 with sepsis. He was found to have pneumonia and a possible SBO on CT on 10/5. Pt has a history of SBO s/p colectomy in 1993 (history (in the 1990s) of trauma causing small bowel injury requiring laparotomy). An NG tube was placed with improving small bowel dilatation but patient had 3-4 episodes of vomiting overnight on 10/7-10/8 and the NG tube had to be replaced due to incorrect placement. Pharmacy now consulted to manage TPN due to worsening SBO and concern for adhesions that require surgery.  Actual body is significantly below ideal body weight.  Of note, patient is extremely cachectic.  GI: s/p ex-lap with LOA, SBR and anastomosis on 10/15.  Prealbumin improved to 17.5, NG to be replaced 10/20. No stool output noted by staff since 10/9. Patient unreliable due to mental status. +BS and flatus. - Full liquid diet: documented last 24 hrs>>now NPO again  Endo: No h/o DM - CBGs WNL, SSI d/c'ed 05/09/18.  Lytes: Na 138 up, K 3.8, Mg 1.9 ok (Goal K>4 and Mg>2 with ileus)  Renal: SCr stable, UOP good, LR at 10 ml/hr  Pulm: Down to RA  Cards: VSS  Hepatobil: LFTs wnl, Lipase elevated 10/4, TG WNL, albumin 2  Neuro: Schizophrenia. Dementia. benztropine, Zyprexa ODT, IV valproate (f/u ability to change to po)  ID: s/p abx for Klebsiella UTI and aspiration PNA. Afebrile, WBC WNL  Prophx: SQ heparin. Hgb down to 6.9 today. Plts stable. INR 1.39 (10/18). Transfuse 10/20.  TPN Access: double lumen PICC placed 10/10 TPN start date: 05/05/18  Nutritional Goals (per RD rec on 10/14): 1550-1750kCal, 85-100 gm protein, >1.5 L fluid per day  Current Nutrition:  TPN  Plan:   Continue TPN at 65 mL/hr, providing 94 g of protein, 218 g of dextrose, and 55 g of lipids for a total of 1,663 kCals per day, meeting 100% of patient needs - NPO again. Replace NG Electrolytes in TPN: adjust Na, K+, Mg Add MVI, trace elements to TPN F/U TPN labs in AM Decrease CBGs to every 8 hr checks (no SSI) Will add Pepcid to TPN for GI prophx.   Abdirizak Richison S. Merilynn Finland, PharmD, BCPS Clinical Staff Pharmacist [phone] 05/15/2018 9:33 AM

## 2018-05-15 NOTE — Progress Notes (Signed)
CRITICAL VALUE ALERT  Critical Value:  Hgb 6.9  Date & Time Notied:  05/15/18 0535  Provider Notified: K . Sofia   Orders Received/Actions taken: RN waiting for new orders to be placed.

## 2018-05-15 NOTE — Progress Notes (Signed)
After sitting in the chair for approximately 1 hour Jeffrey Davila stated he would like to get back in bed.  He was assisted back to the bed by Jacki Cones and myself.  Sister came in asking what we were doing and stating his gown was dirty. She requested a foam dressing be placed on a scab that was bleeding intermittently. I placed the pad. Sister stated she was unaware of new IV site placed this morning. I gave PRN pain medications and made sure [name] was comfortable in the bed. I asked if they needed anything else right now and sister stated no.

## 2018-05-15 NOTE — Plan of Care (Signed)
  Problem: Health Behavior/Discharge Planning: Goal: Ability to manage health-related needs will improve Outcome: Progressing   Problem: Clinical Measurements: Goal: Ability to maintain clinical measurements within normal limits will improve Outcome: Progressing   Problem: Nutrition: Goal: Adequate nutrition will be maintained Outcome: Progressing   

## 2018-05-15 NOTE — Progress Notes (Addendum)
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 removal. Some interval improvement in gaseous distention of the bowel. Stool and air throughout the transverse colon. Postop staples again noted. Air in the rectum. No significant interval change. Degenerative changes of the spine. Basilar chronic interstitial changes and atelectasis. IMPRESSION: NG tube removed. Stable nonspecific bowel gas pattern, suspect resolving obstruction or ileus. Little interval change. Electronically Signed   By: Judie Petit.  Shick M.D.   On: 05/14/2018 10:23   Dg Abd Portable 1v  Result Date: 05/13/2018 CLINICAL DATA:  Abdominal pain, check NG placement EXAM: PORTABLE ABDOMEN - 1 VIEW COMPARISON:  05/09/2018 FINDINGS: Nasogastric catheter is noted with the tip in the stomach. Proximal side port lies in the distal esophagus. This  is stable from the prior exam. Scattered large and small bowel gas is noted. Postsurgical changes are now seen. Mild small bowel dilatation is noted which may be related to a postoperative ileus.  IMPRESSION: Mild postoperative ileus. Nasogastric catheter as described. Electronically Signed   By: Alcide Clever M.D.   On: 05/13/2018 09:56   Dg Abd Portable 1v  Result Date: 05/09/2018 CLINICAL DATA:  Small-bowel obstruction EXAM: PORTABLE ABDOMEN - 1 VIEW COMPARISON:  05/08/2018; 05/06/2018; 05/05/2018 FINDINGS: Re-demonstrated gaseous distention of several loops of small bowel with index loop of small bowel within the left mid hemiabdomen measuring 3.6 cm in diameter. Moderate colonic stool burden. No supine evidence of pneumoperitoneum. No pneumatosis or portal venous gas. Presumed calcified fibroid overlies the left hemipelvis. Stable position of support apparatus. No acute osseus abnormalities. IMPRESSION: No change to slight improvement in suspected small-bowel obstruction. Electronically Signed   By: Simonne Come M.D.   On: 05/09/2018 07:47   Dg Abd Portable 1v  Result Date: 05/08/2018 CLINICAL DATA:  Follow up small bowel obstruction EXAM: PORTABLE ABDOMEN - 1 VIEW COMPARISON:  05/06/2018 FINDINGS: Scattered large and small bowel gas is noted. Multiple dilated loops of small bowel are again identified and stable in appearance. Nasogastric catheter is again noted within the stomach. No free air is seen. Degenerative changes of the lumbar spine are noted. IMPRESSION: Persistent small bowel dilatation. No new focal abnormality is noted. Electronically Signed   By: Alcide Clever M.D.   On: 05/08/2018 08:36   Dg Abd Portable 1v  Result Date: 05/06/2018 CLINICAL DATA:  Small bowel obstruction EXAM: PORTABLE ABDOMEN - 1 VIEW COMPARISON:  Portable exam at 0628 hours compared to 05/05/2018 FINDINGS: Stool scattered throughout colon. Persistent dilatation of small bowel loops in the LEFT mid abdomen up to 4.9 cm diameter. No bowel wall thickening. Bones demineralized. No definite renal calcifications. IMPRESSION: Persistent small bowel dilatation consistent with small bowel obstruction. Little interval  change. Electronically Signed   By: Ulyses Southward M.D.   On: 05/06/2018 10:21   Dg Abd Portable 1v-small Bowel Obstruction Protocol-initial, 8 Hr Delay  Result Date: 05/04/2018 CLINICAL DATA:  Small bowel obstruction protocol. 8 hour delay. EXAM: PORTABLE ABDOMEN - 1 VIEW COMPARISON:  05/03/2018 FINDINGS: Gaseous distention of left upper quadrant small bowel. Enteric tube with tip projected over the mid abdomen consistent with location in the upper stomach. Contrast material is demonstrated in the dilated small bowel. No contrast material is identified in the colon. This is suggestive of high-grade obstruction. Degenerative changes in the spine. IMPRESSION: Contrast material is demonstrated in the dilated small bowel but not in the colon consistent with high-grade obstruction. Electronically Signed   By: Burman Nieves M.D.   On: 05/04/2018 01:55   Dg Abd Portable 1v-small Bowel Obstruction Protocol-initial, 8 Hr Delay  Result Date: 05/01/2018 CLINICAL DATA:  Small bowel obstruction. EXAM: PORTABLE ABDOMEN - 1 VIEW COMPARISON:  Radiograph earlier this day at 1109 hour, CT yesterday. FINDINGS: Enteric tube in place with tip in the stomach, side-port just beyond the gastroesophageal junction. Improving small bowel dilatation in the left abdomen. Moderate stool in the right and transverse colon. No evidence of free air. Pelvic calcification may be stone in the urinary bladder or exophytic prostate calcification. IMPRESSION: Improving small bowel dilatation in the left abdomen since radiograph earlier this day. Electronically Signed   By: Narda Rutherford M.D.   On: 05/01/2018 21:14   Dg Abd Portable 1v-small Bowel Protocol-position Verification  Result Date: 05/01/2018  CLINICAL DATA:  Nasogastric tube placement EXAM: PORTABLE ABDOMEN - 1 VIEW COMPARISON:  Portable exam 1109 hours compared to 04/30/2018 FINDINGS: Tip of nasogastric tube projects over mid stomach. Dilated small bowel loops are seen in the  LEFT mid abdomen. Increased stool in RIGHT colon. Lung bases appear emphysematous with subsegmental atelectasis at LEFT base. Bones demineralized. IMPRESSION: Tip of nasogastric tube projects over mid stomach. Small bowel dilatation in LEFT mid abdomen with increased stool in RIGHT colon. Electronically Signed   By: Ulyses Southward M.D.   On: 05/01/2018 11:22   Dg Abd Portable 1v  Result Date: 04/30/2018 CLINICAL DATA:  67 year old male with vomiting. EXAM: PORTABLE ABDOMEN - 1 VIEW COMPARISON:  04/29/2018 abdominal radiographs. FINDINGS: Portable AP semi upright and supine views at 0847 hours. Enteric tube has been placed and the stomach now appears decompressed. NG tube side hole at the gastric body. Non obstructed bowel gas pattern. Moderate volume of retained stool redemonstrated in the transverse colon and distal sigmoid/rectum. Negative visible lung bases. No pneumoperitoneum. Stable abdominal and pelvic visceral contours. Dystrophic calcification in the pelvis. Stable pelvic catheter, likely a Foley. No acute osseous abnormality identified. IMPRESSION: 1. NG tube in place with resolved gastric distention. 2. Non obstructed bowel gas pattern with moderate volume of retained stool. Electronically Signed   By: Odessa Fleming M.D.   On: 04/30/2018 09:06   Dg Abd Portable 1v  Result Date: 04/29/2018 CLINICAL DATA:  Nausea and vomiting EXAM: PORTABLE ABDOMEN - 1 VIEW COMPARISON:  None. FINDINGS: Gaseous distention of the stomach. Formed stool throughout the colon. No concerning mass effect or calcification. IMPRESSION: Prominent gaseous distension of the stomach. Constipated appearance. Electronically Signed   By: Marnee Spring M.D.   On: 04/29/2018 14:44   [place] Ekg Site Rite  Result Date: 05/04/2018 If Site Rite image not attached, placement could not be confirmed due to current cardiac rhythm.    LOS: 16 days   Signature  Susa Raring M.D on 05/15/2018 at 9:23 AM  To page go to [email or web address] -  password [facility]

## 2018-05-15 NOTE — Progress Notes (Signed)
Patient's sister, Jeffrey Davila, called, she asked how Jeffrey Davila was.  I told he we were about to put an NG tube in because his ileus appeard to be coming back.  She told me not to put the NG tube in, she refused it, and said she was coming up here. Pt was also indicating that he did not want NG.  I explained to both of them several times that an NG tube relieves the pressure in his intestines and helps to resolve the ileus.  Jeffrey Davila insisted we do not put the NG tube in until she says we can.   NG was not inserted.  Pt was moved to chair.

## 2018-05-16 ENCOUNTER — Inpatient Hospital Stay (HOSPITAL_COMMUNITY): Payer: Medicare Other

## 2018-05-16 LAB — CBC
HCT: 27.5 % — ABNORMAL LOW (ref 39.0–52.0)
Hemoglobin: 8.6 g/dL — ABNORMAL LOW (ref 13.0–17.0)
MCH: 28.4 pg (ref 26.0–34.0)
MCHC: 31.3 g/dL (ref 30.0–36.0)
MCV: 90.8 fL (ref 80.0–100.0)
PLATELETS: 608 10*3/uL — AB (ref 150–400)
RBC: 3.03 MIL/uL — ABNORMAL LOW (ref 4.22–5.81)
RDW: 17.5 % — ABNORMAL HIGH (ref 11.5–15.5)
WBC: 8.3 10*3/uL (ref 4.0–10.5)
nRBC: 0 % (ref 0.0–0.2)

## 2018-05-16 LAB — TYPE AND SCREEN
ABO/RH(D): O POS
ANTIBODY SCREEN: NEGATIVE
UNIT DIVISION: 0

## 2018-05-16 LAB — PREALBUMIN: PREALBUMIN: 15.7 mg/dL — AB (ref 18–38)

## 2018-05-16 LAB — DIFFERENTIAL
Abs Immature Granulocytes: 0.08 10*3/uL — ABNORMAL HIGH (ref 0.00–0.07)
BASOS ABS: 0.1 10*3/uL (ref 0.0–0.1)
Basophils Relative: 1 %
EOS ABS: 0.1 10*3/uL (ref 0.0–0.5)
Eosinophils Relative: 2 %
IMMATURE GRANULOCYTES: 1 %
Lymphocytes Relative: 19 %
Lymphs Abs: 1.6 10*3/uL (ref 0.7–4.0)
Monocytes Absolute: 0.8 10*3/uL (ref 0.1–1.0)
Monocytes Relative: 10 %
NEUTROS PCT: 67 %
Neutro Abs: 5.6 10*3/uL (ref 1.7–7.7)

## 2018-05-16 LAB — MAGNESIUM: MAGNESIUM: 2 mg/dL (ref 1.7–2.4)

## 2018-05-16 LAB — COMPREHENSIVE METABOLIC PANEL
ALT: 17 U/L (ref 0–44)
ANION GAP: 4 — AB (ref 5–15)
AST: 15 U/L (ref 15–41)
Albumin: 2.1 g/dL — ABNORMAL LOW (ref 3.5–5.0)
Alkaline Phosphatase: 142 U/L — ABNORMAL HIGH (ref 38–126)
BUN: 21 mg/dL (ref 8–23)
CHLORIDE: 110 mmol/L (ref 98–111)
CO2: 24 mmol/L (ref 22–32)
Calcium: 8.5 mg/dL — ABNORMAL LOW (ref 8.9–10.3)
Creatinine, Ser: 0.62 mg/dL (ref 0.61–1.24)
Glucose, Bld: 99 mg/dL (ref 70–99)
Potassium: 4 mmol/L (ref 3.5–5.1)
SODIUM: 138 mmol/L (ref 135–145)
Total Bilirubin: 0.4 mg/dL (ref 0.3–1.2)
Total Protein: 6.1 g/dL — ABNORMAL LOW (ref 6.5–8.1)

## 2018-05-16 LAB — PHOSPHORUS: PHOSPHORUS: 3.6 mg/dL (ref 2.5–4.6)

## 2018-05-16 LAB — BPAM RBC
Blood Product Expiration Date: 201911182359
ISSUE DATE / TIME: 201910200815
Unit Type and Rh: 5100

## 2018-05-16 LAB — GLUCOSE, CAPILLARY
GLUCOSE-CAPILLARY: 95 mg/dL (ref 70–99)
GLUCOSE-CAPILLARY: 96 mg/dL (ref 70–99)

## 2018-05-16 LAB — TRIGLYCERIDES: TRIGLYCERIDES: 67 mg/dL (ref <150)

## 2018-05-16 MED ORDER — TRAVASOL 10 % IV SOLN
INTRAVENOUS | Status: AC
  Administered 2018-05-16: 18:00:00 via INTRAVENOUS
  Filled 2018-05-16: qty 936

## 2018-05-16 NOTE — Progress Notes (Signed)
6 Days Post-Op  Subjective: Alert.  Curled up in bed.  Somewhat withdrawn.  Is cooperative.  Not agitated.  Not toxic.  Answers yes to every question and so history is in question. NG tube in place Good urine output.  No stools recorded. WBC 8300.  Hemoglobin 8.6 stable.  Chemistries look good.  Creatinine 0.62.  Potassium 4.0. X-rays of abdomen yesterday showed gaseous distention consistent with ileus  Objective: Vital signs in last 24 hours: Temp:  [98.1 F (36.7 C)-99.5 F (37.5 C)] 98.1 F (36.7 C) (10/21 0747) Pulse Rate:  [86-106] 92 (10/21 0458) Resp:  [13-23] 13 (10/21 0458) BP: (99-156)/(68-98) 111/76 (10/21 0458) SpO2:  [95 %-100 %] 95 % (10/21 0458) Last BM Date: (none since surgery per pt's sister)  Intake/Output from previous day: 10/20 0701 - 10/21 0700 In: 1620.3 [I.V.:1199.4; Blood:315; IV Piggyback:105.9] Out: 2400 [Urine:2400] Intake/Output this shift: No intake/output data recorded.  EXAM: General appearance: alert answers questions but inappropriate and very poor historian.  Does not seem to have any insight as to his medical problems.  Confused. Resp: clear to auscultation bilaterally GI: Soft, not distended, he has bowel sounds.  He reports flatus but no way to be sure.  No BM recorded.  NG tube in place  Lab Results:  Results for orders placed or performed during the hospital encounter of 04/29/18 (from the past 24 hour(s))  Urinalysis, Routine w reflex microscopic     Status: Abnormal   Collection Time: 05/15/18 11:09 AM  Result Value Ref Range   Color, Urine YELLOW YELLOW   APPearance CLEAR CLEAR   Specific Gravity, Urine 1.013 1.005 - 1.030   pH 7.0 5.0 - 8.0   Glucose, UA NEGATIVE NEGATIVE mg/dL   Hgb urine dipstick SMALL (A) NEGATIVE   Bilirubin Urine NEGATIVE NEGATIVE   Ketones, ur NEGATIVE NEGATIVE mg/dL   Protein, ur NEGATIVE NEGATIVE mg/dL   Nitrite NEGATIVE NEGATIVE   Leukocytes, UA TRACE (A) NEGATIVE   RBC / HPF 0-5 0 - 5 RBC/hpf    WBC, UA 0-5 0 - 5 WBC/hpf   Bacteria, UA NONE SEEN NONE SEEN   Squamous Epithelial / LPF 0-5 0 - 5   Mucus PRESENT   Glucose, capillary     Status: None   Collection Time: 05/15/18 11:55 AM  Result Value Ref Range   Glucose-Capillary 99 70 - 99 mg/dL  Hemoglobin and hematocrit, blood     Status: Abnormal   Collection Time: 05/15/18  2:30 PM  Result Value Ref Range   Hemoglobin 9.1 (L) 13.0 - 17.0 g/dL   HCT 16.1 (L) 09.6 - 04.5 %  Culture, blood (routine x 2)     Status: None (Preliminary result)   Collection Time: 05/15/18  3:15 PM  Result Value Ref Range   Specimen Description BLOOD SITE NOT SPECIFIED    Special Requests      BOTTLES DRAWN AEROBIC ONLY Blood Culture results may not be optimal due to an inadequate volume of blood received in culture bottles   Culture      NO GROWTH < 24 HOURS Performed at [facility], [address], [place], [place] [address]    Report Status PENDING   Culture, blood (routine x 2)     Status: None (Preliminary result)   Collection Time: 05/15/18  3:22 PM  Result Value Ref Range   Specimen Description BLOOD SITE NOT SPECIFIED    Special Requests      BOTTLES DRAWN AEROBIC ONLY Blood  Culture results may not be optimal due to an inadequate volume of blood received in culture bottles   Culture      NO GROWTH < 24 HOURS Performed at [facility], [address], [place], [place] [address]    Report Status PENDING   Glucose, capillary     Status: None   Collection Time: 05/15/18  5:45 PM  Result Value Ref Range   Glucose-Capillary 81 70 - 99 mg/dL  Glucose, capillary     Status: Abnormal   Collection Time: 05/15/18  9:45 PM  Result Value Ref Range   Glucose-Capillary 107 (H) 70 - 99 mg/dL  Phosphorus     Status: None   Collection Time: 05/16/18  4:01 AM  Result Value Ref Range   Phosphorus 3.6 2.5 - 4.6 mg/dL  Differential     Status: Abnormal   Collection Time: 05/16/18  4:01 AM  Result Value Ref Range   Neutrophils  Relative % 67 %   Neutro Abs 5.6 1.7 - 7.7 K/uL   Lymphocytes Relative 19 %   Lymphs Abs 1.6 0.7 - 4.0 K/uL   Monocytes Relative 10 %   Monocytes Absolute 0.8 0.1 - 1.0 K/uL   Eosinophils Relative 2 %   Eosinophils Absolute 0.1 0.0 - 0.5 K/uL   Basophils Relative 1 %   Basophils Absolute 0.1 0.0 - 0.1 K/uL   Immature Granulocytes 1 %   Abs Immature Granulocytes 0.08 (H) 0.00 - 0.07 K/uL  Triglycerides     Status: None   Collection Time: 05/16/18  4:01 AM  Result Value Ref Range   Triglycerides 67 <150 mg/dL  Prealbumin     Status: Abnormal   Collection Time: 05/16/18  4:01 AM  Result Value Ref Range   Prealbumin 15.7 (L) 18 - 38 mg/dL  CBC     Status: Abnormal   Collection Time: 05/16/18  4:01 AM  Result Value Ref Range   WBC 8.3 4.0 - 10.5 K/uL   RBC 3.03 (L) 4.22 - 5.81 MIL/uL   Hemoglobin 8.6 (L) 13.0 - 17.0 g/dL   HCT 40.9 (L) 81.1 - 91.4 %   MCV 90.8 80.0 - 100.0 fL   MCH 28.4 26.0 - 34.0 pg   MCHC 31.3 30.0 - 36.0 g/dL   RDW 78.2 (H) 95.6 - 21.3 %   Platelets 608 (H) 150 - 400 K/uL   nRBC 0.0 0.0 - 0.2 %  Comprehensive metabolic panel     Status: Abnormal   Collection Time: 05/16/18  4:01 AM  Result Value Ref Range   Sodium 138 135 - 145 mmol/L   Potassium 4.0 3.5 - 5.1 mmol/L   Chloride 110 98 - 111 mmol/L   CO2 24 22 - 32 mmol/L   Glucose, Bld 99 70 - 99 mg/dL   BUN 21 8 - 23 mg/dL   Creatinine, Ser 0.86 0.61 - 1.24 mg/dL   Calcium 8.5 (L) 8.9 - 10.3 mg/dL   Total Protein 6.1 (L) 6.5 - 8.1 g/dL   Albumin 2.1 (L) 3.5 - 5.0 g/dL   AST 15 15 - 41 U/L   ALT 17 0 - 44 U/L   Alkaline Phosphatase 142 (H) 38 - 126 U/L   Total Bilirubin 0.4 0.3 - 1.2 mg/dL   GFR calc non Af Amer >60 >60 mL/min   GFR calc Af Amer >60 >60 mL/min   Anion gap 4 (L) 5 - 15  Magnesium     Status: None  Collection Time: 05/16/18  4:01 AM  Result Value Ref Range   Magnesium 2.0 1.7 - 2.4 mg/dL  Glucose, capillary     Status: None   Collection Time: 05/16/18  7:46 AM  Result Value  Ref Range   Glucose-Capillary 95 70 - 99 mg/dL     Studies/Results: Dg Chest Port 1 View  Result Date: 05/15/2018 CLINICAL DATA:  Shortness of breath. Patient 5 days postop bowel resection with lysis of adhesions. EXAM: PORTABLE CHEST 1 VIEW COMPARISON:  05/11/2018 FINDINGS: Right-sided PICC line with tip over the SVC. Lungs are adequately inflated with subtle left retrocardiac opacification which may be due to vascular crowding or atelectasis and less likely infection. Remainder of the lungs are clear. Cardiomediastinal silhouette is within normal. Persistent evidence of free peritoneal air compatible with patient's recent surgery. IMPRESSION: Mild left base opacification likely atelectasis or vascular crowding and less likely infection. Persistent free peritoneal air compatible recent postop state. Right-sided PICC line with tip over the SVC. Electronically Signed   By: Elberta Fortis M.D.   On: 05/15/2018 09:38   Dg Abd Portable 1v  Result Date: 05/15/2018 CLINICAL DATA:  NG tube placement EXAM: PORTABLE ABDOMEN - 1 VIEW COMPARISON:  05/15/2018 FINDINGS: NG tube is in placed into the fundus of the stomach. Continued gaseous distention of visualized upper abdominal bowel loops. IMPRESSION: NG tube in the fundus of the stomach Electronically Signed   By: Charlett Nose M.D.   On: 05/15/2018 20:11    . benztropine  1 mg Oral BID  . Chlorhexidine Gluconate Cloth  6 each Topical 2 times per day on Tue  . feeding supplement (ENSURE ENLIVE)  237 mL Oral BID BM  . heparin  5,000 Units Subcutaneous Q8H  . lip balm  1 application Topical BID  . OLANZapine zydis  10 mg Oral BID  . polyvinyl alcohol  1 drop Both Eyes TID     Assessment/Plan: s/p Procedure(s): LAPAROSCOPY DIAGNOSTIC LAPAROSCOPIC LYSIS OF ADHESIONS SMALL BOWEL RESECTION  Hx of prior lung surgery Acute metabolic encephalopathy Anemia - transfused 10/14 - 6.9/21 today Dr. 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 due to ileus VTE -SCDs, SQ heparin Foley -none Plan: NG tube for ileus.  Mobilize.   CXR:  Mild left base opacification likely atelectasis or vascular crowding and less likely infection.  Persistent free peritoneal air compatible recent postop state AXR:  Similar appearance of distended large and small bowel consistent with obstruction or ileus.  Dr. Thedore Mins is making NPO/NG and continuing TNA.   @PROBHOSP @  LOS: 17 days    Ernestene Mention 05/16/2018

## 2018-05-16 NOTE — Ethics Note (Signed)
Ethics Consult Request:  I spoke with Dr. Thedore Mins this AM regarding concerns over the patient's sister who is his current decision maker. As he notes in this note of 10/21 there is concern by the medical team that sister not making medical decisions in the best interest of the patient including delaying necessary interventions by refusal to consent. In addition, she is exhibiting behaviors which have raised questions about her capacity to be his medical decision maker. I will follow up with nursing staff and our office of legal counsel, as well as social work and provide guidance.   Kathlyn Sacramento  Ethics Committee Consultant pager [phone]

## 2018-05-16 NOTE — Progress Notes (Signed)
PROGRESS NOTE        PATIENT DETAILS Name: Jeffrey Davila Age: 67 y.o. Sex: male Date of Birth: 13-Jul-1951 Admit Date: 04/29/2018 Admitting Physician Starleen Arms, MD PCP:Uba, Reuel Boom, MD  Brief Narrative:   Patient is a 67 y.o. male with history of schizophrenia/?  Cognitive dysfunction (dementia), hypertension, BPH, remote history (in the 1990s) of trauma causing small bowel injury requiring laparotomy-mostly wheelchair-bound-presented to the hospital with approximately 1 week history of intermittent vomiting, confusion for a few days prior to this hospital admission-presented with sepsis secondary to UTI, vomiting and significant constipation.  Patient was started on IV antibiotics and admitted to the hospital service,initial imaging was suggestive of constipation causing gastric distention-NG tube was placed, however upon further evaluation with a CT scan he was found  to have a small bowel obstruction.  General surgery was consulted, even with n.p.o. status/NG tube decompression-he continues to have SBO-patient's sister has refused to sign consent for surgery, and wants to continue with TNA for a few days so that he can "get stronger" before consenting to surgery.  See below for further details  Subjective: And in bed in no discomfort, denies any headache or chest pain, says he is having some abdominal pain but better than before, nausea is better, NG in place.  Assessment/Plan:  SBO: Due to previous adhesions, surgery was delayed as POA and sister was resistant on making a decision, finally surgery was done for adhesion lysis on 05/10/2018, his NG tube was briefly removed by general surgery on 05/13/2018, he was given trial of liquid diet, TNA was continued through PICC line.  Unfortunately on 05/15/2018 he seems to be developing worsening ileus again, we will continue TNA, make him n.p.o. except few medications by mouth, NG was ordered back again with low  intermittent suction on 05/15/2018 which was placed after much hindrance which was caused by patient's sister for several hours.  Low-grade fever night of 05/14/2018.  Likely due to worsening ileus/SBO, will chest x-ray and UA, blood cultures ordered again on 05/15/2018 main negative so far.  Anemia: Combination of anemia of chronic disease, frequent blood draws, iron panel inconclusive, received 1 unit of packed RBC on 05/09/2018 we will repeat another unit on 05/15/2018 and monitor no signs of bleeding.  We will posttransfusion H&H.  Sepsis secondary to complicated UTI: Sepsis pathophysiology has resolved, urine culture positive for Klebsiella-has completed a course of IV Rocephin.  Blood cultures remain negative.    Aspiration pneumonia: Secondary to SBO/vomiting with underlying frailty.  Has finished antibiotic course, flutter valve added and encouraged to sit up in chair, remains at risk for developing aspiration pneumonia again.  Acute metabolic encephalopathy: This was post SBO surgery now resolved likely was due to effect of mild hypotension and anesthesia.    Schizophrenia: Stable-continue IV Depakote and ODT olanzapine.  Is n.p.o with active NG tube decompression-unable to restart Cogentin (per pharmacy no other route that this can be given). Note-per pharmacy-hospitalist service is unable to use parenteral olanzapine-this is limited psychiatry and critical care.   Acute kidney injury: Hemodynamically mediated-resolved.   Hypertension: Stable-continue to hold antihypertensives.    Severe malnutrition: Once diet has been started-we will start oral supplements-has been started on TNA and now liquids PO.  Deconditioning/debility: Suspect has significant amount of debility at baseline-he is very cachectic-deconditioning/debility has worsened due to acute  illness/SBO.  PT evaluation appreciated.  Attempt to mobilize secretions, attempt to mobilize out of bed to chair.    Hyponatremia.  Was  due to dehydration improved with IV fluids.    DVT Prophylaxis: Prophylactic Heparin   Code Status: Full code   Family Communication:  Patient sister unfortunately has been getting patient's care, initially the surgery was delayed by 10 days because she would not consent to for it, she has threatened various physicians and staff with lawsuits, has been asking intruding questions like what is your religion etc.  Kindly review notes by previous hospitalist Dr.Ghimire between 05/03/2018 to 05/07/2018 for all details.  I Updated sister over the phone by me on 05/08/2018, bedside on 05/09/2018 and then multiple times.    Om 05/09/18 patient's Sister  provided me with the phone number for patient's PCP in [facility] Dr. Nyra Market office phone #[phone] and stated that this physician would like to talk to me about patient's care, called the office at 8:30 AM on 05/09/2018 and left my cell phone number with the secretary for him to call me, was not in the office.  He subsequently called me in the afternoon and said he had never requested to speak to me and that he had nothing to add in patient's care at this time.  He had seen the patient last in April 2019.  On 05/12/18 I was Informed by multiple staff members for the last several days and again on 05/12/2018 that patient sister has been verbally threatening towards them and has been found to be manipulating patient's pumps and monitors.  She has been warned by the charge nurse Ginger per hospital policy.   Note on 05/15/2018 - I was called in the room by nursing staff as patient's sister was again refusing an NG tube for worsening ileus, she was repeatedly threatening staff with lawsuits, she has been telling focus that she is a Training and development officer and her brother owns a [employer] worker [employer] and she will sue the hospital and the staff.  She was also trying to video record all conversations and she was clearly told that she does not have our consent to do so and I  clearly told her that we do not wish to be recorded.  She would ask me various questions but would not let me answer and start talking midsentence and cutting me off, she was requested not to do so as it would not help with the patient's care.  She was clearly explained that NG tube will be needed or else patient's ileus can get worse and can cause bowel perforation and that she would be responsible for such ocurrence.  She was also told that we do not appreciate being asked what religion we belong to and other personal questions.  She then requested that she is going to find a physician in [place] and that patient should be transferred there, I told her that I would definitely do so once I have been provided with the details.  From my side I have also called [facility] on 05/15/2018 at 1:15 PM to see if they would accept the patient in transfer.   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  Her overall behavior does not  seem normal to me, I will involve the hospital legal team to see if we can evaluate if patient's sister is making best decisions for patient's health which I highly doubt.  I think she is hindering care and in fact might be harming her brothers health interest unknowingly.     Disposition Plan:    Remain inpatient-not stable for discharge.   Initially at [facility] transfer was requested by patient's sister which [facility] declined.  Sister requested [facility] will transfer on 05/15/2018 which I have initiated the same day.  Still waiting to hear from them.   Antimicrobial agents: Anti-infectives (From admission, onward)   Start     Dose/Rate Route Frequency Ordered Stop   05/10/18 0941  ceFAZolin (ANCEF) 2-4 GM/100ML-%  IVPB    Note to Pharmacy:  [name]   : cabinet override      05/10/18 0941 05/10/18 2144   05/01/18 1215  metroNIDAZOLE (FLAGYL) IVPB 500 mg  Status:  Discontinued     500 mg 100 mL/hr over 60 Minutes Intravenous Every 8 hours 05/01/18 1201 05/06/18 1153   04/30/18 1100  cefTRIAXone (ROCEPHIN) 2 g in sodium chloride 0.9 % 100 mL IVPB  Status:  Discontinued     2 g 200 mL/hr over 30 Minutes Intravenous Every 24 hours 04/29/18 1416 05/06/18 1153   04/29/18 1430  cefTRIAXone (ROCEPHIN) 1 g in sodium chloride 0.9 % 100 mL IVPB     1 g 200 mL/hr over 30 Minutes Intravenous Every 24 hours 04/29/18 1416 04/30/18 1429   04/29/18 1130  cefTRIAXone (ROCEPHIN) 1 g in sodium chloride 0.9 % 100 mL IVPB     1 g 200 mL/hr over 30 Minutes Intravenous  Once 04/29/18 1118 04/29/18 1251      Procedures:  Small bowel obstruction surgery with adhesion lysis on 05/10/2018  NG Tube  PICC Line  NG replaced 05/15/18   CONSULTS:   CCS  Time spent: 25 minutes minutes-Greater than 50% of this time was spent in counseling, explanation of diagnosis, planning of further management, and coordination of care.  MEDICATIONS: Scheduled Meds: . benztropine  1 mg Oral BID  . Chlorhexidine Gluconate Cloth  6 each Topical 2 times per day on Tue  . feeding supplement (ENSURE ENLIVE)  237 mL Oral BID BM  . heparin  5,000 Units Subcutaneous Q8H  . lip balm  1 application Topical BID  . OLANZapine zydis  10 mg Oral BID  . polyvinyl alcohol  1 drop Both Eyes TID   Continuous Infusions: . sodium chloride 1,000 mL (05/14/18 1850)  . acetaminophen    . lactated ringers 10 mL/hr at 05/11/18 0617  . TPN ADULT (ION) 65 mL/hr at 05/15/18 1747  . valproate sodium 250 mg (05/16/18 0900)   PRN Meds:.sodium chloride, acetaminophen, acetaminophen, albuterol, alum & mag hydroxide-simeth, bisacodyl, diphenhydrAMINE, magic mouthwash, menthol-cetylpyridinium, metoprolol tartrate, morphine injection, ondansetron  (ZOFRAN) IV, phenol, sodium chloride flush   PHYSICAL EXAM: Vital signs: Vitals:   05/16/18 0000 05/16/18 0229 05/16/18 0458 05/16/18 0747  BP:   111/76   Pulse: 88  92   Resp: 18  13   Temp:  99.4 F (37.4 C) 99.5 F (37.5 C) 98.1 F (36.7 C)  TempSrc:   Oral Axillary  SpO2: 96%  95%   Weight:      Height:       Filed Weights   04/29/18 0842  Weight: 49.9 kg   Body mass index is 17.75 kg/m.    Exam  Awake Alert, NG in place, in no discomfort [place].AT,PERRAL Supple Neck,No JVD, No cervical lymphadenopathy appriciated.  Symmetrical Chest wall movement, Good air movement bilaterally, CTAB RRR,No Gallops, Rubs or new Murmurs, No Parasternal Heave Hypoactive but +ve B.Sounds, Abd Soft, No tenderness, No organomegaly appriciated, No rebound - guarding or rigidity. No Cyanosis, Clubbing or edema, No new Rash or bruise, foley  I have personally reviewed following labs and imaging studies  LABORATORY DATA:  CBC:  Recent Labs  Lab 05/12/18 1256 05/13/18 0407 05/14/18 0403 05/15/18 0419 05/15/18 1430 05/16/18 0401  WBC 14.4* 14.7* 10.4 8.0  --  8.3  NEUTROABS  --   --   --   --   --  5.6  HGB 7.6* 7.4* 7.0* 6.9* 9.1* 8.6*  HCT 24.3* 24.0* 22.4* 21.8* 28.7* 27.5*  MCV 94.9 95.2 95.3 95.2  --  90.8  PLT 566* 587* 576* 586*  --  608*    Basic Metabolic Panel: Recent Labs  Lab 05/11/18 0435 05/12/18 0336 05/13/18 0407 05/15/18 0419 05/16/18 0401  NA 135 129* 132* 138 138  K 4.8 4.9 4.1 3.8 4.0  CL 108 102 103 109 110  CO2 23 23 20* 23 24  GLUCOSE 101* 96 106* 109* 99  BUN 27* 23 20 21 21   CREATININE 0.66 0.62 0.73 0.56* 0.62  CALCIUM 8.6* 8.5* 8.7* 8.4* 8.5*  MG 2.0 1.7 1.9 1.9 2.0  PHOS  --  3.1  --   --  3.6    GFR: Estimated Creatinine Clearance: 64.1 mL/min (by C-G formula based on SCr of 0.62 mg/dL).  Liver Function Tests: Recent Labs  Lab 05/12/18 0336 05/15/18 0419 05/16/18 0401  AST 17 19 15   ALT 11 17 17   ALKPHOS 88 119 142*  BILITOT  0.5 0.2* 0.4  PROT 6.2* 5.8* 6.1*  ALBUMIN 2.6* 2.0* 2.1*   No results for input(s): LIPASE, AMYLASE in the last 168 hours. No results for input(s): AMMONIA in the last 168 hours.  Coagulation Profile: Recent Labs  Lab 05/13/18 0811  INR 1.39    Cardiac Enzymes: No results for input(s): CKTOTAL, CKMB, CKMBINDEX, TROPONINI in the last 168 hours.  BNP (last 3 results) No results for input(s): PROBNP in the last 8760 hours.  HbA1C: No results for input(s): HGBA1C in the last 72 hours.  CBG: Recent Labs  Lab 05/15/18 0807 05/15/18 1155 05/15/18 1745 05/15/18 2145 05/16/18 0746  GLUCAP 109* 99 81 107* 95    Lipid Profile: Recent Labs    05/16/18 0401  TRIG 67    Thyroid Function Tests: No results for input(s): TSH, T4TOTAL, FREET4, T3FREE, THYROIDAB in the last 72 hours.  Anemia Panel: No results for input(s): VITAMINB12, FOLATE, FERRITIN, TIBC, IRON, RETICCTPCT in the last 72 hours.  Urine analysis:    Component Value Date/Time   COLORURINE YELLOW 05/15/2018 1109   APPEARANCEUR CLEAR 05/15/2018 1109   LABSPEC 1.013 05/15/2018 1109   PHURINE 7.0 05/15/2018 1109   GLUCOSEU NEGATIVE 05/15/2018 1109   HGBUR SMALL (A) 05/15/2018 1109   BILIRUBINUR NEGATIVE 05/15/2018 1109   KETONESUR NEGATIVE 05/15/2018 1109   PROTEINUR NEGATIVE 05/15/2018 1109   UROBILINOGEN 1.0 06/08/2010 1523   NITRITE NEGATIVE 05/15/2018 1109   LEUKOCYTESUR TRACE (A) 05/15/2018 1109    Sepsis Labs: Lactic Acid, Venous    Component Value Date/Time   LATICACIDVEN 1.07 04/29/2018 1334    MICROBIOLOGY: Recent Results (from the past 240 hour(s))  Surgical PCR screen     Status: None  Collection Time: 05/09/18 11:29 PM  Result Value Ref Range Status   MRSA, PCR NEGATIVE NEGATIVE Final   Staphylococcus aureus NEGATIVE NEGATIVE Final    Comment: (NOTE) The Xpert SA Assay (FDA approved for NASAL specimens in patients 59 years of age and older), is one component of a  comprehensive surveillance program. It is not intended to diagnose infection nor to guide or monitor treatment. Performed at [facility], [address], [place], [place] [address]   Culture, blood (routine x 2)     Status: None (Preliminary result)   Collection Time: 05/15/18  3:15 PM  Result Value Ref Range Status   Specimen Description BLOOD SITE NOT SPECIFIED  Final   Special Requests   Final    BOTTLES DRAWN AEROBIC ONLY Blood Culture results may not be optimal due to an inadequate volume of blood received in culture bottles   Culture   Final    NO GROWTH < 24 HOURS Performed at [facility], [address] [place], [place] [address]    Report Status PENDING  Incomplete  Culture, blood (routine x 2)     Status: None (Preliminary result)   Collection Time: 05/15/18  3:22 PM  Result Value Ref Range Status   Specimen Description BLOOD SITE NOT SPECIFIED  Final   Special Requests   Final    BOTTLES DRAWN AEROBIC ONLY Blood Culture results may not be optimal due to an inadequate volume of blood received in culture bottles   Culture   Final    NO GROWTH < 24 HOURS Performed at [facility], [address] [place], [place] [address]    Report Status PENDING  Incomplete    RADIOLOGY STUDIES/RESULTS:  Ct Abdomen Pelvis Wo Contrast  Result Date: 04/30/2018 CLINICAL DATA:  Nausea, bilious vomiting EXAM: CT ABDOMEN AND PELVIS WITHOUT CONTRAST TECHNIQUE: Multidetector CT imaging of the abdomen and pelvis was performed following the standard protocol without IV contrast. Sagittal and coronal MPR images reconstructed from axial data set. Oral contrast was not administered. COMPARISON:  None FINDINGS: Lower chest: Emphysematous changes with BILATERAL lower lobe infiltrates consistent with either pneumonia or aspiration. Hepatobiliary: Liver unremarkable. Gallbladder not well visualized due to streak artifacts, grossly unremarkable. Pancreas: Normal appearance Spleen:  Normal appearance Adrenals/Urinary Tract: Question adrenal thickening bilaterally. No obvious renal mass or hydronephrosis. Ureters not visualized. Foley catheter decompresses urinary bladder. Stomach/Bowel: Nasogastric tube in stomach. Low-attenuation fluid distends stomach and multiple proximal small bowel loops, with small bowel loops up to 4.1 cm diameter. Distal small bowel loops and colon are decompressed. Scattered stool throughout colon. Findings likely represent mid small bowel obstruction. Appendix not visualized. Suspected rectal wall thickening. Vascular/Lymphatic: Atherosclerotic calcification aorta. Aorta normal caliber. Reproductive: Minimal prostatic enlargement and scattered prostatic calcifications. Other: No definite free air or free fluid. Nonspecific stranding of presacral fat. Musculoskeletal: No acute osseous findings. IMPRESSION: Dilated proximal and decompressed distal small bowel loops compatible with small bowel obstruction, likely in the mid small bowel. Due to lack of fat planes, streak artifacts, and lack of contrast, unable to localize site of obstruction. No evidence of perforation/free air. Question rectal wall thickening, recommend correlation with proctoscopy. Emphysematous changes with bibasilar pulmonary infiltrates either representing pneumonia or aspiration. Electronically Signed   By: Ulyses Southward M.D.   On: 04/30/2018 19:38   Dg Abd 1 View  Result Date: 05/03/2018 CLINICAL DATA:  Small bowel obstruction EXAM: ABDOMEN - 1 VIEW COMPARISON:  05/02/2018 FINDINGS: NG tube is  in the stomach. Gaseous distention of the stomach and left abdominal small bowel loops. Gas and stool within nondistended colon. No free air or organomegaly. IMPRESSION: Gaseous distention of stomach and left abdominal small bowel loops likely reflecting small bowel obstruction. NG tube is in the stomach. Electronically Signed   By: Charlett Nose M.D.   On: 05/03/2018 10:05   Dg Abd 1 View  Result Date:  05/02/2018 CLINICAL DATA:  Small bowel obstruction EXAM: ABDOMEN - 1 VIEW COMPARISON:  May 01, 2018 abdominal radiograph and CT abdomen and pelvis April 30, 2018 FINDINGS: Nasogastric tube no longer present. There is slightly less bowel dilatation in the left upper quadrant compared to 1 day prior. There is moderate air in the stomach. No bowel dilatation elsewhere. No air-fluid levels. No free air. There is moderate stool in the colon. There is atelectatic change in the right lung base. IMPRESSION: Nasogastric tube no longer appreciable. Less small bowel dilatation in the left upper quadrant. Question resolving bowel obstruction. No free air evident. Electronically Signed   By: Bretta Bang III M.D.   On: 05/02/2018 14:20   Ct Head Wo Contrast  Result Date: 04/29/2018 CLINICAL DATA:  Altered level of consciousness increased over last 2 days, 2 episodes of nausea and vomiting, history of prior brain injury, dementia, former smoker, hypertension EXAM: CT HEAD WITHOUT CONTRAST TECHNIQUE: Contiguous axial images were obtained from the base of the skull through the vertex without intravenous contrast. COMPARISON:  06/08/2010 FINDINGS: Brain: Generalized atrophy. Normal ventricular morphology. No midline shift or mass effect. Small vessel chronic ischemic changes of deep cerebral white matter. 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 No intracranial hemorrhage, mass lesion, evidence of acute infarction, or extra-axial fluid collection. Vascular: Minimal atherosclerotic calcification of internal carotid arteries at skull base Skull: Intact Sinuses/Orbits: Clear Other: N/A IMPRESSION: Atrophy with small vessel chronic ischemic changes of deep cerebral white matter. 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 No acute intracranial abnormalities. Electronically Signed   By: Ulyses Southward M.D.   On: 04/29/2018 09:58   Dg Chest Port 1 View  Result Date: 05/15/2018 CLINICAL DATA:  Shortness of  breath. Patient 5 days postop bowel resection with lysis of adhesions. EXAM: PORTABLE CHEST 1 VIEW COMPARISON:  05/11/2018 FINDINGS: Right-sided PICC line with tip over the SVC. Lungs are adequately inflated with subtle left retrocardiac opacification which may be due to vascular crowding or atelectasis and less likely infection. Remainder of the lungs are clear. Cardiomediastinal silhouette is within normal. Persistent evidence of free peritoneal air compatible with patient's recent surgery. IMPRESSION: Mild left base opacification likely atelectasis or vascular crowding and less likely infection. Persistent free peritoneal air compatible recent postop state. Right-sided PICC line with tip over the SVC. Electronically Signed   By: Elberta Fortis M.D.   On: 05/15/2018 09:38   Dg Chest Port 1 View  Result Date: 05/11/2018 CLINICAL DATA:  Shortness of breath, hypertension, pneumonia, dementia, former smoker EXAM: PORTABLE CHEST 1 VIEW COMPARISON:  Portable exam 0945 hours compared to 05/09/2018 FINDINGS: Nasogastric tube extends into stomach. RIGHT arm PICC line tip projects over SVC. Normal heart size and pulmonary vascularity. Elongation of thoracic aorta. Lungs appear emphysematous but clear. No pleural effusion or pneumothorax. Bones demineralized. Free air identified under the hemidiaphragms bilaterally; EHR indicates patient underwent a laparoscopic procedure on 05/10/2018, small-bowel resection and lysis of adhesions for small-bowel obstruction prior operative note. IMPRESSION: COPD changes without infiltrate. Free air consistent with abdominal surgery 1 day  ago. Electronically Signed   By: Ulyses Southward M.D.   On: 05/11/2018 10:21   Dg Chest Port 1 View  Result Date: 05/09/2018 CLINICAL DATA:  Cough EXAM: PORTABLE CHEST 1 VIEW COMPARISON:  05/04/2018 FINDINGS: NG tube is stable. Right upper extremity PICC placed. Tip is at the cavoatrial junction. Lungs remain hyperaerated and clear. Small right pleural  effusion is stable. No pneumothorax. Aorta remains somewhat prominent due to tortuosity and rotation of the thorax. IMPRESSION: New right upper extremity PICC with its tip at the cavoatrial junction. No active cardiopulmonary disease. Electronically Signed   By: Jolaine Click M.D.   On: 05/09/2018 07:37   Dg Chest Port 1 View  Result Date: 04/29/2018 CLINICAL DATA:  Altered mental status EXAM: PORTABLE CHEST 1 VIEW COMPARISON:  April 12, 2018 FINDINGS: There is mild scarring in the lateral right base and in the medial right lower lung region. There is no edema or consolidation. The heart size and pulmonary vascularity are normal. No adenopathy. There is a recent appearing fracture of the posterior right seventh rib with alignment essentially anatomic. An older fracture of the posterior right eighth rib is noted. No pneumothorax. IMPRESSION: Recent appearing fracture posterior right seventh rib. Older fracture posterior right eighth rib noted. Areas of mild scarring noted on the right. No edema or consolidation. Stable cardiac silhouette. Electronically Signed   By: Bretta Bang III M.D.   On: 04/29/2018 08:46   Dg Chest Port 1v Same Day  Result Date: 05/01/2018 CLINICAL DATA:  history of schizophrenia, hypertension, BPH-mostly wheelchair-bound-presented to the hospital with approximately 1 week history of intermittent vomiting, confusion for a few days prior to this hospital admission-presented with sepsis secondary to UTI, and significant constipation. C/o SOB EXAM: PORTABLE CHEST - 1 VIEW SAME DAY COMPARISON:  04/29/2018 FINDINGS: Lungs are hyperinflated. Subsegmental atelectasis or early infiltrate at the left lung base, new since previous. Right lung clear. Heart size normal.  Tortuous ectatic thoracic aorta. No effusion. No pneumothorax. Right anterolateral fourth rib fracture, age indeterminate. Nasogastric tube extends to the stomach. IMPRESSION: 1. New patchy infiltrate or subsegmental  atelectasis at the left lung base. 2. Right fourth rib fracture without pneumothorax. 3. Nasogastric tube placement to the stomach. Electronically Signed   By: Corlis Leak M.D.   On: 05/01/2018 08:24   Dg Abd 2 Views  Result Date: 05/05/2018 CLINICAL DATA:  Vomiting.  History of right lumpectomy. EXAM: ABDOMEN - 2 VIEW COMPARISON:  05/04/2018 FINDINGS: Improve dilation of small bowel loops with residual borderline gas-filled distended small bowel loops in the left upper quadrant of the abdomen. Normal colonic bowel gas pattern. No evidence of free intra-abdominal gas. Normal cardiac silhouette. Tortuosity of the thoracic aorta. Right pleural reflection versus small pleural effusion. No lobar airspace consolidation. Enteric catheter tip within the expected location the gastric body, side hole at the GE junction level. Right PICC line terminates in the expected location of the cavoatrial junction. IMPRESSION: Improved appearance of small-bowel obstruction. Enteric catheter with side hole at the level of the GE junction. Advancement may be considered. Possible small right pleural effusion. Electronically Signed   By: Ted Mcalpine M.D.   On: 05/05/2018 12:51   Dg Abd Acute W/chest  Result Date: 05/04/2018 CLINICAL DATA:  Follow-up small bowel obstruction. EXAM: DG ABDOMEN ACUTE W/ 1V CHEST COMPARISON:  Abdominal x-rays 05/04/1999 19 dating back to 04/29/2018. CT abdomen and pelvis 04/30/2018. Chest x-rays 05/01/2018, 04/12/2018. FINDINGS: Nasogastric tube tip in the fundus of the stomach. Persistent  marked gaseous distension of the jejunum in the LEFT UPPER QUADRANT, slowly progressive over the past 3 days, with air-fluid levels on the LATERAL decubitus image. No evidence of free intraperitoneal air. Normal caliber colon with moderate stool burden. Calcified uterine fibroid in the pelvic midline. Cardiac silhouette normal in size, unchanged. Thoracic aorta tortuous and atherosclerotic. Hilar and  mediastinal contours otherwise unremarkable. Interval improvement in aeration in the LEFT lung base, with only minimal patchy opacities persisting. Lungs otherwise clear. Emphysematous changes in both lungs with scarring in the RIGHT mid lung, unchanged. Pleuroparenchymal scarring at the RIGHT lung base which accounts for the blunting of the costophrenic angle, unchanged dating back to the original 04/12/2018 examination. IMPRESSION: 1. Persistent partial small bowel obstruction which has slowly worsened over the past 3 days. 2. No evidence of free intraperitoneal air. 3. Improved aeration in the LEFT LOWER LOBE, with only mild atelectasis and/or pneumonia persisting. Electronically Signed   By: Hulan Saas M.D.   On: 05/04/2018 09:28   Dg Abd Portable 1v  Result Date: 05/15/2018 CLINICAL DATA:  NG tube placement EXAM: PORTABLE ABDOMEN - 1 VIEW COMPARISON:  05/15/2018 FINDINGS: NG tube is in placed into the fundus of the stomach. Continued gaseous distention of visualized upper abdominal bowel loops. IMPRESSION: NG tube in the fundus of the stomach Electronically Signed   By: Charlett Nose M.D.   On: 05/15/2018 20:11   Dg Abd Portable 1v  Result Date: 05/15/2018 CLINICAL DATA:  Small bowel obstruction. EXAM: PORTABLE ABDOMEN - 1 VIEW COMPARISON:  One-view abdomen 05/14/2018 FINDINGS: Patient's retained left.  Heart size normal.  Lung bases are clear. Extend loops of large and small bowel are stable. There is no free air or pneumatosis. Surgical clips are noted. IMPRESSION: 1. Similar appearance of distended large and small bowel consistent with obstruction or ileus. Electronically Signed   By: Marin Roberts M.D.   On: 05/15/2018 08:10   Dg Abd Portable 1v  Result Date: 05/14/2018 CLINICAL DATA:  Abdominal pain EXAM: PORTABLE ABDOMEN - 1 VIEW COMPARISON:  05/13/2018 FINDINGS: Interval NG tube removal. Some interval improvement in gaseous distention of the bowel. Stool and air throughout the  transverse colon. Postop staples again noted. Air in the rectum. No significant interval change. Degenerative changes of the spine. Basilar chronic interstitial changes and atelectasis. IMPRESSION: NG tube removed. Stable nonspecific bowel gas pattern, suspect resolving obstruction or ileus. Little interval change. Electronically Signed   By: Judie Petit.  Shick M.D.   On: 05/14/2018 10:23   Dg Abd Portable 1v  Result Date: 05/13/2018 CLINICAL DATA:  Abdominal pain, check NG placement EXAM: PORTABLE ABDOMEN - 1 VIEW COMPARISON:  05/09/2018 FINDINGS: Nasogastric catheter is noted with the tip in the stomach. Proximal side port lies in the distal esophagus. This is stable from the prior exam. Scattered large and small bowel gas is noted. Postsurgical changes are now seen. Mild small bowel dilatation is noted which may be related to a postoperative ileus. IMPRESSION: Mild postoperative ileus. Nasogastric catheter as described. Electronically Signed   By: Alcide Clever M.D.   On: 05/13/2018 09:56   Dg Abd Portable 1v  Result Date: 05/09/2018 CLINICAL DATA:  Small-bowel obstruction EXAM: PORTABLE ABDOMEN - 1 VIEW COMPARISON:  05/08/2018; 05/06/2018; 05/05/2018 FINDINGS: Re-demonstrated gaseous distention of several loops of small bowel with index loop of small bowel within the left mid hemiabdomen measuring 3.6 cm in diameter. Moderate colonic stool burden. No supine evidence of pneumoperitoneum. No pneumatosis or portal venous gas. Presumed calcified  fibroid overlies the left hemipelvis. Stable position of support apparatus. No acute osseus abnormalities. IMPRESSION: No change to slight improvement in suspected small-bowel obstruction. Electronically Signed   By: Simonne Come M.D.   On: 05/09/2018 07:47   Dg Abd Portable 1v  Result Date: 05/08/2018 CLINICAL DATA:  Follow up small bowel obstruction EXAM: PORTABLE ABDOMEN - 1 VIEW COMPARISON:  05/06/2018 FINDINGS: Scattered large and small bowel gas is noted. Multiple  dilated loops of small bowel are again identified and stable in appearance. Nasogastric catheter is again noted within the stomach. No free air is seen. Degenerative changes of the lumbar spine are noted. IMPRESSION: Persistent small bowel dilatation. No new focal abnormality is noted. Electronically Signed   By: Alcide Clever M.D.   On: 05/08/2018 08:36   Dg Abd Portable 1v  Result Date: 05/06/2018 CLINICAL DATA:  Small bowel obstruction EXAM: PORTABLE ABDOMEN - 1 VIEW COMPARISON:  Portable exam at 0628 hours compared to 05/05/2018 FINDINGS: Stool scattered throughout colon. Persistent dilatation of small bowel loops in the LEFT mid abdomen up to 4.9 cm diameter. No bowel wall thickening. Bones demineralized. No definite renal calcifications. IMPRESSION: Persistent small bowel dilatation consistent with small bowel obstruction. Little interval change. Electronically Signed   By: Ulyses Southward M.D.   On: 05/06/2018 10:21   Dg Abd Portable 1v-small Bowel Obstruction Protocol-initial, 8 Hr Delay  Result Date: 05/04/2018 CLINICAL DATA:  Small bowel obstruction protocol. 8 hour delay. EXAM: PORTABLE ABDOMEN - 1 VIEW COMPARISON:  05/03/2018 FINDINGS: Gaseous distention of left upper quadrant small bowel. Enteric tube with tip projected over the mid abdomen consistent with location in the upper stomach. Contrast material is demonstrated in the dilated small bowel. No contrast material is identified in the colon. This is suggestive of high-grade obstruction. Degenerative changes in the spine. IMPRESSION: Contrast material is demonstrated in the dilated small bowel but not in the colon consistent with high-grade obstruction. Electronically Signed   By: Burman Nieves M.D.   On: 05/04/2018 01:55   Dg Abd Portable 1v-small Bowel Obstruction Protocol-initial, 8 Hr Delay  Result Date: 05/01/2018 CLINICAL DATA:  Small bowel obstruction. EXAM: PORTABLE ABDOMEN - 1 VIEW COMPARISON:  Radiograph earlier this day at 1109  hour, CT yesterday. FINDINGS: Enteric tube in place with tip in the stomach, side-port just beyond the gastroesophageal junction. Improving small bowel dilatation in the left abdomen. Moderate stool in the right and transverse colon. No evidence of free air. Pelvic calcification may be stone in the urinary bladder or exophytic prostate calcification. IMPRESSION: Improving small bowel dilatation in the left abdomen since radiograph earlier this day. Electronically Signed   By: Narda Rutherford M.D.   On: 05/01/2018 21:14   Dg Abd Portable 1v-small Bowel Protocol-position Verification  Result Date: 05/01/2018 CLINICAL DATA:  Nasogastric tube placement EXAM: PORTABLE ABDOMEN - 1 VIEW COMPARISON:  Portable exam 1109 hours compared to 04/30/2018 FINDINGS: Tip of nasogastric tube projects over mid stomach. Dilated small bowel loops are seen in the LEFT mid abdomen. Increased stool in RIGHT colon. Lung bases appear emphysematous with subsegmental atelectasis at LEFT base. Bones demineralized. IMPRESSION: Tip of nasogastric tube projects over mid stomach. Small bowel dilatation in LEFT mid abdomen with increased stool in RIGHT colon. Electronically Signed   By: Ulyses Southward M.D.   On: 05/01/2018 11:22   Dg Abd Portable 1v  Result Date: 04/30/2018 CLINICAL DATA:  67 year old male with vomiting. EXAM: PORTABLE ABDOMEN - 1 VIEW COMPARISON:  04/29/2018 abdominal radiographs. FINDINGS: Portable  AP semi upright and supine views at 0847 hours. Enteric tube has been placed and the stomach now appears decompressed. NG tube side hole at the gastric body. Non obstructed bowel gas pattern. Moderate volume of retained stool redemonstrated in the transverse colon and distal sigmoid/rectum. Negative visible lung bases. No pneumoperitoneum. Stable abdominal and pelvic visceral contours. Dystrophic calcification in the pelvis. Stable pelvic catheter, likely a Foley. No acute osseous abnormality identified. IMPRESSION: 1. NG tube in  place with resolved gastric distention. 2. Non obstructed bowel gas pattern with moderate volume of retained stool. Electronically Signed   By: Odessa Fleming M.D.   On: 04/30/2018 09:06   Dg Abd Portable 1v  Result Date: 04/29/2018 CLINICAL DATA:  Nausea and vomiting EXAM: PORTABLE ABDOMEN - 1 VIEW COMPARISON:  None. FINDINGS: Gaseous distention of the stomach. Formed stool throughout the colon. No concerning mass effect or calcification. IMPRESSION: Prominent gaseous distension of the stomach. Constipated appearance. Electronically Signed   By: Marnee Spring M.D.   On: 04/29/2018 14:44   [place] Ekg Site Rite  Result Date: 05/04/2018 If Site Rite image not attached, placement could not be confirmed due to current cardiac rhythm.    LOS: 17 days   Signature  Susa Raring M.D on 05/16/2018 at 9:46 AM  To page go to [email or web address] - password [facility]

## 2018-05-16 NOTE — Progress Notes (Signed)
PHARMACY - ADULT TOTAL PARENTERAL NUTRITION CONSULT NOTE   Pharmacy Consult for TPN Indication: SBO  Patient Measurements: Height: 5' 6" (167.6 cm) Weight: 110 lb (49.9 kg) IBW/kg (Calculated) : 63.8 TPN AdjBW (KG): 49.9 Body mass index is 17.75 kg/m.  Assessment:  67 YOM admitted on 10/4 with sepsis. He was found to have pneumonia and a possible SBO on CT on 10/5. Pt has a history of SBO s/p colectomy in 1993 (history (in the 1990s) of trauma causing small bowel injury requiring laparotomy). An NG tube was placed with improving small bowel dilatation but patient had 3-4 episodes of vomiting overnight on 10/7-10/8 and the NG tube had to be replaced due to incorrect placement. Pharmacy now consulted to manage TPN due to worsening SBO and concern for adhesions that require surgery.  Actual body is significantly below ideal body weight.  Of note, patient is extremely cachectic.  GI: Pre-albumin down to 15.7. S/p ex-lap with LOA, SBR and anastomosis on 10/15.  Prealbumin improved to 17.5, NGT replaced 10/20. +BS, no BM recorded. Worsening ileus 10/20. Patient unreliable due to mental status. Full liquid > NPO again Endo: No h/o DM - CBGs WNL, SSI d/c'd 05/09/18. Lytes: All WNL with 10/20 adjustments in TPN bag (Goal K>/=4 and Mg>2/= with ileus) Renal: SCr stable WNL, UOP good, LR at 10 ml/hr Pulm: RA Cards: mostly VSS. Lasix IV x 1 on 10/20 Hepatobil: LFTs / Tbili / TG wnl, Lipase elevated 10/4, alk phos up to 142 Neuro: Schizophrenia. Dementia. Benztropine (held since 10/20), Zyprexa ODT, IV valproate, PRN APAP / morphine ID: s/p abx for Klebsiella UTI and aspiration PNA. Afebrile, WBC WNL Heme: SQ heparin. Plts stable. Transfuse 10/20 > Hg improved.  TPN Access: double lumen PICC placed 10/10 TPN start date: 05/05/18  Nutritional Goals (per RD rec on 10/14): 1550-1750kCal, 85-100 gm protein, >1.5 L fluid per day  Current Nutrition:  TPN NPO  Plan:  Continue TPN at 65 mL/hr,  providing 94 g of protein, 218 g of dextrose, and 55 g of lipids for a total of 1,663 kCals per day, meeting 100% of patient needs Electrolytes in TPN: Continue 10/20 adjusted Na/K/Mg; Cl:Ac ratio 1:1 Add MVI, trace elements, Pepcid 40mg to TPN Continue to monitor CBGs and adjust as needed (SSI d/c'd 10/14) Continue LR at 10 ml/hr per MD F/U TPN labs in AM   Baird, PharmD, BCPS Clinical Pharmacist Clinical phone [phone] Please check AMION for all [facility] Pharmacy contact numbers 05/16/2018 10:25 AM  

## 2018-05-16 NOTE — Progress Notes (Signed)
Occupational Therapy Treatment Patient Details Name: Jeffrey Davila MRN: [id number] DOB: 08-07-1950 Today's Date: 05/16/2018    History of present illness Pt is a 67 y/o male admitted secondary to AMS and Sepsis most likely secondary to UTI. CT negative for acute abnormality. Work up during hospitalizations shows, aspiration PNA, SBO, acute metabolic encephalopathy, anemia, AKI, severe malnutrition, deconditioning/debility, and hyponatremia.  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  PMH inlcudes BI, dementia, HTN, and schizophrenia.    OT comments  Pt requires encouragement for participation in OT session today and with increased fatigue after minimal activity. Pt engaged in AAROM to bil UEs and LEs at bed level for continued strengthening/endurance as precursor to performing functional tasks. Pt requires cues throughout for initiation, technique and to follow through with task at hand. Pt's sister present and providing encouragement during HEP completion. Feel POC remains appropriate at this time. Will continue to follow acutely to progress pt towards established OT goals.   Follow Up Recommendations  SNF;Supervision/Assistance - 24 hour    Equipment Recommendations  (TBD in next venue)          Precautions / Restrictions Precautions Precautions: Fall Precaution Comments: NG tube Restrictions Weight Bearing Restrictions: No       Mobility Bed Mobility Overal bed mobility: Needs Assistance Bed Mobility: Rolling;Sidelying to Sit           General bed mobility comments: Pt is limited to participation based on his behavior and cognitive deficits.  Refusal to move.   Transfers                 General transfer comment: Min A for power up into standing and then Mod A for safety and balance. flexed posture throughout    Balance Overall balance assessment: Needs assistance   Sitting balance-Leahy Scale:  Poor                                     ADL either performed or assessed with clinical judgement   ADL Overall ADL's : Needs assistance/impaired                                       General ADL Comments: pt with limited participation, though agreeable to UB/LB exercises at bed level; sister present and also providing encouragement to complete                       Cognition Arousal/Alertness: Awake/alert Behavior During Therapy: Flat affect Overall Cognitive Status: History of cognitive impairments - at baseline                                 General Comments: 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        Exercises Exercises: General Upper Extremity General Exercises - Upper Extremity Shoulder Flexion: AAROM;Both;10 reps;Supine Shoulder Extension: AAROM;10 reps;Both;Supine Shoulder Horizontal ABduction: AAROM;10 reps;Both;Supine Shoulder Horizontal ADduction: AAROM;Both;10 reps;Supine Elbow Flexion: AAROM;Both;10 reps;Supine Elbow Extension: AAROM;Both;10 reps;Supine General Exercises - Lower Extremity Ankle Circles/Pumps: AAROM;Both;10 reps;Supine Heel Slides: AAROM;Both;Supine;10 reps   Shoulder Instructions       General Comments      Pertinent Vitals/ Pain       Pain  Assessment: Faces Faces Pain Scale: Hurts little more Pain Location: generalized with certain movements Pain Descriptors / Indicators: Grimacing;Moaning Pain Intervention(s): Limited activity within patient's tolerance;Monitored during session  Home Living                                          Prior Functioning/Environment              Frequency  Min 2X/week        Progress Toward Goals  OT Goals(current goals can now be found in the care plan section)  Progress towards OT goals: OT to reassess next treatment  Acute Rehab OT Goals Patient Stated Goal: Sister "to go to rehab and then home." OT  Goal Formulation: Patient unable to participate in goal setting Time For Goal Achievement: 05/18/18 Potential to Achieve Goals: Fair  Plan Discharge plan remains appropriate    Co-evaluation                 AM-PAC PT "6 Clicks" Daily Activity     Outcome Measure   Help from another person eating meals?: Total Help from another person taking care of personal grooming?: A Little Help from another person toileting, which includes using toliet, bedpan, or urinal?: A Lot Help from another person bathing (including washing, rinsing, drying)?: A Lot Help from another person to put on and taking off regular upper body clothing?: A Lot Help from another person to put on and taking off regular lower body clothing?: A Lot 6 Click Score: 12    End of Session    OT Visit Diagnosis: Muscle weakness (generalized) (M62.81);Other symptoms and signs involving cognitive function   Activity Tolerance Patient tolerated treatment well;Patient limited by fatigue   Patient Left in bed;with call bell/phone within reach;with family/visitor present   Nurse Communication Mobility status        Time: 1610-9604 OT Time Calculation (min): 16 min  Charges: OT General Charges $OT Visit: 1 Visit OT Treatments $Therapeutic Activity: 8-22 mins  [name], OT Supplemental Rehabilitation Services Pager [phone] Office [phone]   [name] 05/16/2018, 4:20 PM

## 2018-05-16 NOTE — Progress Notes (Signed)
Nutrition Follow-up  DOCUMENTATION CODES:   Severe malnutrition in context of acute illness/injury, Underweight  INTERVENTION:   - TPN per pharmacy  - d/c Ensure Enlive as pt is NPO  NUTRITION DIAGNOSIS:   Severe Malnutrition related to acute illness (SBO) as evidenced by moderate muscle depletion, severe muscle depletion, moderate fat depletion, severe fat depletion, percent weight loss.  Ongoing  GOAL:   Patient will meet greater than or equal to 90% of their needs  Met via TPN  MONITOR:   Diet advancement, Labs, Weight trends, Skin, I & O's  REASON FOR ASSESSMENT:   Consult New TPN/TNA  ASSESSMENT:    Jeffrey Davila  is a 67 y.o. male, past medical history of schizophrenia, hypertension, BPH, who lives with his sister at home, for last 15 years, he is wheelchair dependent at baseline, but able to transfer from bed to chair, communicative and appropriate per sister, she reports over the last few days patient has been less interactive, sleeping more often during the day, and over the last 2 days almost with no significant oral intake, which prompted her to come to ED.  10/4 - NGT placed 10/8 - NGT removed, NGT replaced 10/10 - TPN started 10/15 - s/p ex lap, LOA, small bowel resection and anastomosis 10/17 - NGT clamped, advanced to clear liquids 10/18 - NGT removed, advanced to full liquids 10/19 - back to clear liquids due to pt reporting emesis 10/20 - developed worsening ileus, NGT replaced, NPO  Per chart review, concerns with pt's sister not making medical decisions in best interest of the patient. Chaplain to follow up with nursing staff, office of legal counsel, and social work to provide guidance.  Noted therapies recommending SNF at this time.  Spoke with RN who reports pt is doing well today and TPN running at goal rate.  Spoke with pt and sister at bedside. Pt reports he is feeling "bad" and states that his stomach hurts. RD obtained bed weight: 110.7  lbs. Will record in chart. Pt's sister asked what pt's weight was; RD relayed information to her.  Pt coughing and having secretions at end of visit.  Current TPN: 65 ml/hr to provide 1663 kcal, 94 grams of protein, 218 grams of dextrose, and 55 grams of lipids, meeting 100% of pt's estimated needs.  Medications reviewed and include: Zyprexa 10 mg BID, TPN  Labs reviewed: hemoglobin 8.6 (L), HCT 27.5 (L) CBG's: 95, 107, 81 x 24 hours  UOP: 2400 ml x 24 hours NGT: 250 ml x 24 hours I/O's: -4.4 L since admit  Diet Order:   Diet Order            Diet NPO time specified Except for: Sips with Meds  Diet effective now              EDUCATION NEEDS:   Education needs have been addressed  Skin:  Skin Assessment: Skin Integrity Issues: Incisions: closed incision to abdomen  Last BM:  10/9  Height:   Ht Readings from Last 1 Encounters:  04/29/18 '5\' 6"'  (1.676 m)    Weight:   Wt Readings from Last 1 Encounters:  04/29/18 49.9 kg    Ideal Body Weight:  64.5 kg  BMI:  Body mass index is 17.75 kg/m.  Estimated Nutritional Needs:   Kcal:  1550-1750  Protein:  85-100 grams  Fluid:  >1.5 L    Gaynell Face, MS, RD, LDN Inpatient Clinical Dietitian Pager: [phone] Weekend/After Hours: [phone]

## 2018-05-16 NOTE — Progress Notes (Signed)
Physical Therapy Treatment Patient Details Name: Jeffrey Davila MRN: [id number] DOB: Feb 26, 1951 Today's Date: 05/16/2018    History of Present Illness Pt is a 67 y/o male admitted secondary to AMS and Sepsis most likely secondary to UTI. CT negative for acute abnormality. Work up during hospitalizations shows, aspiration PNA, SBO, acute metabolic encephalopathy, anemia, AKI, severe malnutrition, deconditioning/debility, and hyponatremia.  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  PMH inlcudes BI, dementia, HTN, and schizophrenia.     PT Comments    Patient required max cues to participate in minimal therapeutic exercise of lower extremities. Patient refused med mobility, transfers and ambulation this date indicating he is in too much pain. Family not present today. Upon reviewing patient's chart, he is able to perform transfers and short distance ambulation with minimal assistance and 1-2 hands held. Patient making progress toward goals slowly.  Patient would continue to benefit from skilled physical therapy in current environment and next venue to continue return to prior function and increase strength, endurance, balance, coordination, and functional mobility and gait skills. Goals updated.    Follow Up Recommendations  SNF;Supervision/Assistance - 24 hour     Equipment Recommendations  Other (comment)(TBA further.)    Recommendations for Other Services       Precautions / Restrictions Precautions Precautions: Fall Precaution Comments: NG tube Restrictions Weight Bearing Restrictions: No    Mobility  Bed Mobility Overal bed mobility: Needs Assistance Bed Mobility: Rolling;Sidelying to Sit           General bed mobility comments: Pt is limited to participation based on his behavior and cognitive deficits.  Refusal to move.   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      Exercises General Exercises - Lower Extremity Ankle Circles/Pumps: AAROM;Both;10 reps;Supine Heel Slides: AAROM;Both;Supine;10 reps    General Comments        Pertinent Vitals/Pain Pain Assessment: Faces Faces Pain Scale: Hurts little more Pain Descriptors / Indicators: Grimacing Pain Intervention(s): Monitored during session;Limited activity within patient's tolerance    Home Living                      Prior Function            PT Goals (current goals can now be found in the care plan section) Acute Rehab PT Goals Patient Stated Goal: Sister "to go to rehab and then home." PT Goal Formulation: With patient/family Time  For Goal Achievement: 05/30/18 Potential to Achieve Goals: Fair Progress towards PT goals: Progressing toward goals    Frequency    Min 2X/week      PT Plan Current plan remains appropriate    Co-evaluation              AM-PAC PT "6 Clicks" Daily Activity  Outcome Measure  Difficulty turning over in bed (including adjusting bedclothes, sheets and blankets)?: Unable Difficulty moving from lying on back to sitting on the side of the bed? : Unable Difficulty sitting down on and standing up from a chair with arms (e.g., wheelchair, bedside commode, etc,.)?: Unable Help needed moving to and from a bed to chair (including a wheelchair)?: A  Little Help needed walking in hospital room?: A Little Help needed climbing 3-5 steps with a railing? : A Little 6 Click Score: 12    End of Session   Activity Tolerance: Patient tolerated treatment well;Treatment limited secondary to agitation Patient left: with call bell/phone within reach;with family/visitor present;in bed;with bed alarm set Nurse Communication: Mobility status PT Visit Diagnosis: Unsteadiness on feet (R26.81);Muscle weakness (generalized) (M62.81);Difficulty in walking, not elsewhere classified (R26.2)     Time: 1191-4782 PT Time Calculation (min) (ACUTE ONLY): 24 min  Charges:  $Therapeutic Exercise: 8-22 mins                     Katina Dung. Hartnett-Rands, MS, PT [facility] #[id number] 05/16/2018, 12:44 PM

## 2018-05-16 NOTE — Progress Notes (Signed)
This is not my patient.

## 2018-05-17 ENCOUNTER — Inpatient Hospital Stay (HOSPITAL_COMMUNITY): Payer: Medicare Other

## 2018-05-17 DIAGNOSIS — Z7189 Other specified counseling: Secondary | ICD-10-CM

## 2018-05-17 DIAGNOSIS — Z515 Encounter for palliative care: Secondary | ICD-10-CM

## 2018-05-17 LAB — COMPREHENSIVE METABOLIC PANEL
ALK PHOS: 168 U/L — AB (ref 38–126)
ALT: 18 U/L (ref 0–44)
AST: 14 U/L — ABNORMAL LOW (ref 15–41)
Albumin: 2.1 g/dL — ABNORMAL LOW (ref 3.5–5.0)
Anion gap: 6 (ref 5–15)
BILIRUBIN TOTAL: 0.4 mg/dL (ref 0.3–1.2)
BUN: 22 mg/dL (ref 8–23)
CALCIUM: 8.7 mg/dL — AB (ref 8.9–10.3)
CO2: 23 mmol/L (ref 22–32)
CREATININE: 0.61 mg/dL (ref 0.61–1.24)
Chloride: 107 mmol/L (ref 98–111)
Glucose, Bld: 100 mg/dL — ABNORMAL HIGH (ref 70–99)
Potassium: 4.1 mmol/L (ref 3.5–5.1)
SODIUM: 136 mmol/L (ref 135–145)
TOTAL PROTEIN: 6.1 g/dL — AB (ref 6.5–8.1)

## 2018-05-17 LAB — MAGNESIUM: Magnesium: 2.1 mg/dL (ref 1.7–2.4)

## 2018-05-17 LAB — GLUCOSE, CAPILLARY: Glucose-Capillary: 96 mg/dL (ref 70–99)

## 2018-05-17 LAB — PHOSPHORUS: PHOSPHORUS: 3.5 mg/dL (ref 2.5–4.6)

## 2018-05-17 MED ORDER — MORPHINE SULFATE (PF) 2 MG/ML IV SOLN
2.0000 mg | Freq: Once | INTRAVENOUS | Status: AC
  Administered 2018-05-17: 2 mg via INTRAVENOUS
  Filled 2018-05-17: qty 1

## 2018-05-17 MED ORDER — TRAVASOL 10 % IV SOLN
INTRAVENOUS | Status: AC
  Administered 2018-05-17: 18:00:00 via INTRAVENOUS
  Filled 2018-05-17: qty 936
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
## 2018-05-17 NOTE — Progress Notes (Signed)
"  Sister" [name] arrived to the room, came to nursing station requesting to speak to charge nurse stating that the NGT was out. Arrived to the patient room to find the nasal gastric tube holder attached to the NGT was off and hanging at the tip of the nose. Suction still attached and working. Advanced the tube about 0.5-1 inch to replaced the holder. Clean the nose and replaced with a new holder. NGT remains to working properly and tele sitter was made aware to focus on patient and visitor activity.

## 2018-05-17 NOTE — Progress Notes (Addendum)
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 of mild scarring noted on the right. No edema or consolidation. Stable cardiac silhouette. Electronically Signed   By: Bretta Bang III M.D.   On: 04/29/2018 08:46   Dg Chest Port 1v Same Day  Result Date: 05/01/2018 CLINICAL DATA:  history of schizophrenia, hypertension, BPH-mostly wheelchair-bound-presented to  the hospital with approximately 1 week history of intermittent vomiting, confusion for a few days prior to this hospital admission-presented with sepsis secondary to UTI, and significant constipation. C/o SOB EXAM: PORTABLE CHEST - 1 VIEW SAME DAY COMPARISON:  04/29/2018 FINDINGS: Lungs are hyperinflated. Subsegmental atelectasis or early infiltrate at the left lung base, new since previous. Right lung clear. Heart size normal.  Tortuous ectatic thoracic aorta. No effusion. No pneumothorax. Right anterolateral fourth rib fracture, age indeterminate. Nasogastric tube extends to the stomach. IMPRESSION: 1. New patchy infiltrate or subsegmental atelectasis at the left lung  base. 2. Right fourth rib fracture without pneumothorax. 3. Nasogastric tube placement to the stomach. Electronically Signed   By: Corlis Leak M.D.   On: 05/01/2018 08:24   Dg Abd 2 Views  Result Date: 05/05/2018 CLINICAL DATA:  Vomiting.  History of right lumpectomy. EXAM: ABDOMEN - 2 VIEW COMPARISON:  05/04/2018 FINDINGS: Improve dilation of small bowel loops with residual borderline gas-filled distended small bowel loops in the left upper quadrant of the abdomen. Normal colonic bowel gas pattern. No evidence of free intra-abdominal gas. Normal cardiac silhouette. Tortuosity of the thoracic aorta. Right pleural reflection versus small pleural effusion. No lobar airspace consolidation. Enteric catheter tip within the expected location the gastric body, side hole at the GE junction level. Right PICC line terminates in the expected location of the cavoatrial junction. IMPRESSION: Improved appearance of small-bowel obstruction. Enteric catheter with side hole at the level of the GE junction. Advancement may be considered. Possible small right pleural effusion. Electronically Signed   By: Ted Mcalpine M.D.   On: 05/05/2018 12:51   Dg Abd Acute W/chest  Result Date: 05/04/2018 CLINICAL DATA:  Follow-up small bowel obstruction. EXAM: DG ABDOMEN ACUTE W/ 1V CHEST COMPARISON:  Abdominal x-rays 05/04/1999 19 dating back to 04/29/2018. CT abdomen and pelvis 04/30/2018. Chest x-rays 05/01/2018, 04/12/2018. FINDINGS: Nasogastric tube tip in the fundus of the stomach. Persistent marked gaseous distension of the jejunum in the LEFT UPPER QUADRANT, slowly progressive over the past 3 days, with air-fluid levels on the LATERAL decubitus image. No evidence of free intraperitoneal air. Normal caliber colon with moderate stool burden. Calcified uterine fibroid in the pelvic midline. Cardiac silhouette normal in size, unchanged. Thoracic aorta tortuous and atherosclerotic. Hilar and mediastinal contours otherwise  unremarkable. Interval improvement in aeration in the LEFT lung base, with only minimal patchy opacities persisting. Lungs otherwise clear. Emphysematous changes in both lungs with scarring in the RIGHT mid lung, unchanged. Pleuroparenchymal scarring at the RIGHT lung base which accounts for the blunting of the costophrenic angle, unchanged dating back to the original 04/12/2018 examination. IMPRESSION: 1. Persistent partial small bowel obstruction which has slowly worsened over the past 3 days. 2. No evidence of free intraperitoneal air. 3. Improved aeration in the LEFT LOWER LOBE, with only mild atelectasis and/or pneumonia persisting. Electronically Signed   By: Hulan Saas M.D.   On: 05/04/2018 09:28   Dg Abd Portable 1v  Result Date: 05/17/2018 CLINICAL DATA:  Small-bowel obstruction. EXAM: PORTABLE ABDOMEN - 1 VIEW COMPARISON:  05/16/2018.  05/15/2018.  CT 04/30/2018. FINDINGS: Surgical staples noted over the abdomen. NG tube noted with its tip in the stomach. Distended loops of small and large bowel are again noted. Similar findings noted on prior exam. No free air. Degenerative change in osteopenia lumbar spine and both hips. Prostate calcifications are noted. Aortoiliac atherosclerotic vascular calcification. Diffuse osteopenia degenerative changes lumbar spine and both hips. Mild blunting of the right costophrenic angle  again noted suggesting scarring and/or tiny effusion. IMPRESSION: NG tube noted with its tip in the stomach. Dilated loops of small and large bowel are again noted without significant interim change. No free air identified. Electronically Signed   By: Maisie Fus  Register   On: 05/17/2018 07:32   Dg Abd Portable 1v  Result Date: 05/15/2018 CLINICAL DATA:  NG tube placement EXAM: PORTABLE ABDOMEN - 1 VIEW COMPARISON:  05/15/2018 FINDINGS: NG tube is in placed into the fundus of the stomach. Continued gaseous distention of visualized upper abdominal bowel loops. IMPRESSION: NG tube  in the fundus of the stomach Electronically Signed   By: Charlett Nose M.D.   On: 05/15/2018 20:11   Dg Abd Portable 1v  Result Date: 05/15/2018 CLINICAL DATA:  Small bowel obstruction. EXAM: PORTABLE ABDOMEN - 1 VIEW COMPARISON:  One-view abdomen 05/14/2018 FINDINGS: Patient's retained left.  Heart size normal.  Lung bases are clear. Extend loops of large and small bowel are stable. There is no free air or pneumatosis. Surgical clips are noted. IMPRESSION: 1. Similar appearance of distended large and small bowel consistent with obstruction or ileus. Electronically Signed   By: Marin Roberts M.D.   On: 05/15/2018 08:10   Dg Abd Portable 1v  Result Date: 05/14/2018 CLINICAL DATA:  Abdominal pain EXAM: PORTABLE ABDOMEN - 1 VIEW COMPARISON:  05/13/2018 FINDINGS: Interval NG tube removal. Some interval improvement in gaseous distention of the bowel. Stool and air throughout the transverse colon. Postop staples again noted. Air in the rectum. No significant interval change. Degenerative changes of the spine. Basilar chronic interstitial changes and atelectasis. IMPRESSION: NG tube removed. Stable nonspecific bowel gas pattern, suspect resolving obstruction or ileus. Little interval change. Electronically Signed   By: Judie Petit.  Shick M.D.   On: 05/14/2018 10:23   Dg Abd Portable 1v  Result Date: 05/13/2018 CLINICAL DATA:  Abdominal pain, check NG placement EXAM: PORTABLE ABDOMEN - 1 VIEW COMPARISON:  05/09/2018 FINDINGS: Nasogastric catheter is noted with the tip in the stomach. Proximal side port lies in the distal esophagus. This is stable from the prior exam. Scattered large and small bowel gas is noted. Postsurgical changes are now seen. Mild small bowel dilatation is noted which may be related to a postoperative ileus. IMPRESSION: Mild postoperative ileus. Nasogastric catheter as described. Electronically Signed   By: Alcide Clever M.D.   On: 05/13/2018 09:56   Dg Abd Portable 1v  Result Date:  05/09/2018 CLINICAL DATA:  Small-bowel obstruction EXAM: PORTABLE ABDOMEN - 1 VIEW COMPARISON:  05/08/2018; 05/06/2018; 05/05/2018 FINDINGS: Re-demonstrated gaseous distention of several loops of small bowel with index loop of small bowel within the left mid hemiabdomen measuring 3.6 cm in diameter. Moderate colonic stool burden. No supine evidence of pneumoperitoneum. No pneumatosis or portal venous gas. Presumed calcified fibroid overlies the left hemipelvis. Stable position of support apparatus. No acute osseus abnormalities. IMPRESSION: No change to slight improvement in suspected small-bowel obstruction. Electronically Signed   By: Simonne Come M.D.   On: 05/09/2018 07:47   Dg Abd Portable 1v  Result Date: 05/08/2018 CLINICAL DATA:  Follow up small bowel obstruction EXAM: PORTABLE ABDOMEN - 1 VIEW COMPARISON:  05/06/2018 FINDINGS: Scattered large and small bowel gas is noted. Multiple dilated loops of small bowel are again identified and stable in appearance. Nasogastric catheter is again noted within the stomach. No free air is seen. Degenerative changes of the lumbar spine are noted. IMPRESSION: Persistent small bowel dilatation. No new focal abnormality is noted. Electronically Signed  By: Alcide Clever M.D.   On: 05/08/2018 08:36   Dg Abd Portable 1v  Result Date: 05/06/2018 CLINICAL DATA:  Small bowel obstruction EXAM: PORTABLE ABDOMEN - 1 VIEW COMPARISON:  Portable exam at 0628 hours compared to 05/05/2018 FINDINGS: Stool scattered throughout colon. Persistent dilatation of small bowel loops in the LEFT mid abdomen up to 4.9 cm diameter. No bowel wall thickening. Bones demineralized. No definite renal calcifications. IMPRESSION: Persistent small bowel dilatation consistent with small bowel obstruction. Little interval change. Electronically Signed   By: Ulyses Southward M.D.   On: 05/06/2018 10:21   Dg Abd Portable 1v-small Bowel Obstruction Protocol-initial, 8 Hr Delay  Result Date:  05/04/2018 CLINICAL DATA:  Small bowel obstruction protocol. 8 hour delay. EXAM: PORTABLE ABDOMEN - 1 VIEW COMPARISON:  05/03/2018 FINDINGS: Gaseous distention of left upper quadrant small bowel. Enteric tube with tip projected over the mid abdomen consistent with location in the upper stomach. Contrast material is demonstrated in the dilated small bowel. No contrast material is identified in the colon. This is suggestive of high-grade obstruction. Degenerative changes in the spine. IMPRESSION: Contrast material is demonstrated in the dilated small bowel but not in the colon consistent with high-grade obstruction. Electronically Signed   By: Burman Nieves M.D.   On: 05/04/2018 01:55   Dg Abd Portable 1v-small Bowel Obstruction Protocol-initial, 8 Hr Delay  Result Date: 05/01/2018 CLINICAL DATA:  Small bowel obstruction. EXAM: PORTABLE ABDOMEN - 1 VIEW COMPARISON:  Radiograph earlier this day at 1109 hour, CT yesterday. FINDINGS: Enteric tube in place with tip in the stomach, side-port just beyond the gastroesophageal junction. Improving small bowel dilatation in the left abdomen. Moderate stool in the right and transverse colon. No evidence of free air. Pelvic calcification may be stone in the urinary bladder or exophytic prostate calcification. IMPRESSION: Improving small bowel dilatation in the left abdomen since radiograph earlier this day. Electronically Signed   By: Narda Rutherford M.D.   On: 05/01/2018 21:14   Dg Abd Portable 1v-small Bowel Protocol-position Verification  Result Date: 05/01/2018 CLINICAL DATA:  Nasogastric tube placement EXAM: PORTABLE ABDOMEN - 1 VIEW COMPARISON:  Portable exam 1109 hours compared to 04/30/2018 FINDINGS: Tip of nasogastric tube projects over mid stomach. Dilated small bowel loops are seen in the LEFT mid abdomen. Increased stool in RIGHT colon. Lung bases appear emphysematous with subsegmental atelectasis at LEFT base. Bones demineralized. IMPRESSION: Tip of  nasogastric tube projects over mid stomach. Small bowel dilatation in LEFT mid abdomen with increased stool in RIGHT colon. Electronically Signed   By: Ulyses Southward M.D.   On: 05/01/2018 11:22   Dg Abd Portable 1v  Result Date: 04/30/2018 CLINICAL DATA:  67 year old male with vomiting. EXAM: PORTABLE ABDOMEN - 1 VIEW COMPARISON:  04/29/2018 abdominal radiographs. FINDINGS: Portable AP semi upright and supine views at 0847 hours. Enteric tube has been placed and the stomach now appears decompressed. NG tube side hole at the gastric body. Non obstructed bowel gas pattern. Moderate volume of retained stool redemonstrated in the transverse colon and distal sigmoid/rectum. Negative visible lung bases. No pneumoperitoneum. Stable abdominal and pelvic visceral contours. Dystrophic calcification in the pelvis. Stable pelvic catheter, likely a Foley. No acute osseous abnormality identified. IMPRESSION: 1. NG tube in place with resolved gastric distention. 2. Non obstructed bowel gas pattern with moderate volume of retained stool. Electronically Signed   By: Odessa Fleming M.D.   On: 04/30/2018 09:06   Dg Abd Portable 1v  Result Date: 04/29/2018 CLINICAL DATA:  Nausea and vomiting EXAM: PORTABLE ABDOMEN - 1 VIEW COMPARISON:  None. FINDINGS: Gaseous distention of the stomach. Formed stool throughout the colon. No concerning mass effect or calcification. IMPRESSION: Prominent gaseous distension of the stomach. Constipated appearance. Electronically Signed   By: Marnee Spring M.D.   On: 04/29/2018 14:44   [place] Ekg Site Rite  Result Date: 05/04/2018 If Site Rite image not attached, placement could not be confirmed due to current cardiac rhythm.    LOS: 18 days   Signature  Susa Raring M.D on 05/17/2018 at 9:42 AM  To page go to [email or web address] - password [email or web address]

## 2018-05-17 NOTE — Progress Notes (Signed)
Palliative Medicine RN Note: Patient seen with Dr Romie Minus.   Mr Tugwell is alone in his room (no family present) with the telesitter in place. He woke to voice & was able to answer some questions ("Are you hurting?" "What hurts?" "Are you cold?"), but he indicated he did not want to talk longer. He did say that he was cold, so we straightened his blankets/sheet and provided a clean blanket from the warmer.  Margret Chance Duvall Comes, RN, BSN, [facility] Palliative Medicine Team 05/17/2018 1:11 PM Office [phone]

## 2018-05-17 NOTE — Progress Notes (Signed)
Case discussed with CSW Chiropodist. CSW created an APS report to check into choosing a different decision maker for patient due to patient's sister being unable to produce POA paperwork.   [name] Nahsir Venezia LCSW [phone]

## 2018-05-17 NOTE — Progress Notes (Signed)
Resident alert and stable throughout shift.  Complained of pain x 1, medicated and re-assessed patient verbalized no pain control. Sister went home for the night.  Patient rested well, no distress noted.  Will continue to monitor.

## 2018-05-17 NOTE — Plan of Care (Signed)
  Problem: Health Behavior/Discharge Planning: Goal: Ability to manage health-related needs will improve Outcome: Progressing   Problem: Clinical Measurements: Goal: Ability to maintain clinical measurements within normal limits will improve Outcome: Progressing Goal: Will remain free from infection Outcome: Progressing Goal: Diagnostic test results will improve Outcome: Progressing Goal: Respiratory complications will improve Outcome: Progressing Goal: Cardiovascular complication will be avoided Outcome: Progressing   Problem: Nutrition: Goal: Adequate nutrition will be maintained Outcome: Progressing   Problem: Elimination: Goal: Will not experience complications related to bowel motility Outcome: Progressing Goal: Will not experience complications related to urinary retention Outcome: Progressing   Problem: Skin Integrity: Goal: Risk for impaired skin integrity will decrease Outcome: Progressing   

## 2018-05-17 NOTE — Progress Notes (Signed)
Patient ID: Jeffrey Davila, male   DOB: February 01, 1951, 67 y.o.   MRN: [id number]    7 Days Post-Op  Subjective: Patient doesn't really answer any questions.  NGT in place, no output documented.  About 300cc in cannister currently.  Objective: Vital signs in last 24 hours: Temp:  [97.9 F (36.6 C)-98 F (36.7 C)] 98 F (36.7 C) (10/21 1801) Last BM Date: (none since surgery per pt's sister)  Intake/Output from previous day: 10/21 0701 - 10/22 0700 In: 1281.4 [I.V.:997.3; NG/GT:30; IV Piggyback:254] Out: 300 [Urine:300] Intake/Output this shift: No intake/output data recorded.  PE: Abd: soft, ND, +BS, NGT with only about 300cc out in cannister.  Incisions c/d/i with staples.  Lab Results:  Recent Labs    05/15/18 0419 05/15/18 1430 05/16/18 0401  WBC 8.0  --  8.3  HGB 6.9* 9.1* 8.6*  HCT 21.8* 28.7* 27.5*  PLT 586*  --  608*   BMET Recent Labs    05/16/18 0401 05/17/18 0412  NA 138 136  K 4.0 4.1  CL 110 107  CO2 24 23  GLUCOSE 99 100*  BUN 21 22  CREATININE 0.62 0.61  CALCIUM 8.5* 8.7*   PT/INR No results for input(s): LABPROT, INR in the last 72 hours. CMP     Component Value Date/Time   NA 136 05/17/2018 0412   K 4.1 05/17/2018 0412   CL 107 05/17/2018 0412   CO2 23 05/17/2018 0412   GLUCOSE 100 (H) 05/17/2018 0412   BUN 22 05/17/2018 0412   CREATININE 0.61 05/17/2018 0412   CALCIUM 8.7 (L) 05/17/2018 0412   PROT 6.1 (L) 05/17/2018 0412   ALBUMIN 2.1 (L) 05/17/2018 0412   AST 14 (L) 05/17/2018 0412   ALT 18 05/17/2018 0412   ALKPHOS 168 (H) 05/17/2018 0412   BILITOT 0.4 05/17/2018 0412   GFRNONAA >60 05/17/2018 0412   GFRAA >60 05/17/2018 0412   Lipase     Component Value Date/Time   LIPASE 204 (H) 04/29/2018 0904       Studies/Results: Dg Abd 1 View  Result Date: 05/16/2018 CLINICAL DATA:  Feeding tube advanced today. EXAM: ABDOMEN - 1 VIEW COMPARISON:  Plain films of the abdomen dated 05/15/2018 and 05/14/2018. FINDINGS: Enteric  tube is coiled in the stomach with tip at the level of the gastric fundus/cardia. Distended gas-filled loops of large and small bowel are again appreciated throughout the abdomen and upper pelvis. IMPRESSION: 1. Feeding tube is coiled in the stomach with tip at the level of the gastric fundus/cardia. 2. Persistent gaseous distention of both large and small bowel loops throughout the abdomen and pelvis, compatible with previous reports of obstruction or ileus. Electronically Signed   By: Bary Richard M.D.   On: 05/16/2018 20:26   Dg Chest Port 1 View  Result Date: 05/15/2018 CLINICAL DATA:  Shortness of breath. Patient 5 days postop bowel resection with lysis of adhesions. EXAM: PORTABLE CHEST 1 VIEW COMPARISON:  05/11/2018 FINDINGS: Right-sided PICC line with tip over the SVC. Lungs are adequately inflated with subtle left retrocardiac opacification which may be due to vascular crowding or atelectasis and less likely infection. Remainder of the lungs are clear. Cardiomediastinal silhouette is within normal. Persistent evidence of free peritoneal air compatible with patient's recent surgery. IMPRESSION: Mild left base opacification likely atelectasis or vascular crowding and less likely infection. Persistent free peritoneal air compatible recent postop state. Right-sided PICC line with tip over the SVC. Electronically Signed   By: Elberta Fortis M.D.  On: 05/15/2018 09:38   Dg Abd Portable 1v  Result Date: 05/17/2018 CLINICAL DATA:  Small-bowel obstruction. EXAM: PORTABLE ABDOMEN - 1 VIEW COMPARISON:  05/16/2018.  05/15/2018.  CT 04/30/2018. FINDINGS: Surgical staples noted over the abdomen. NG tube noted with its tip in the stomach. Distended loops of small and large bowel are again noted. Similar findings noted on prior exam. No free air. Degenerative change in osteopenia lumbar spine and both hips. Prostate calcifications are noted. Aortoiliac atherosclerotic vascular calcification. Diffuse osteopenia  degenerative changes lumbar spine and both hips. Mild blunting of the right costophrenic angle again noted suggesting scarring and/or tiny effusion. IMPRESSION: NG tube noted with its tip in the stomach. Dilated loops of small and large bowel are again noted without significant interim change. No free air identified. Electronically Signed   By: Maisie Fus  Register   On: 05/17/2018 07:32   Dg Abd Portable 1v  Result Date: 05/15/2018 CLINICAL DATA:  NG tube placement EXAM: PORTABLE ABDOMEN - 1 VIEW COMPARISON:  05/15/2018 FINDINGS: NG tube is in placed into the fundus of the stomach. Continued gaseous distention of visualized upper abdominal bowel loops. IMPRESSION: NG tube in the fundus of the stomach Electronically Signed   By: Charlett Nose M.D.   On: 05/15/2018 20:11    Anti-infectives: Anti-infectives (From admission, onward)   Start     Dose/Rate Route Frequency Ordered Stop   05/10/18 0941  ceFAZolin (ANCEF) 2-4 GM/100ML-% IVPB    Note to Pharmacy:  [name]   : cabinet override      05/10/18 0941 05/10/18 2144   05/01/18 1215  metroNIDAZOLE (FLAGYL) IVPB 500 mg  Status:  Discontinued     500 mg 100 mL/hr over 60 Minutes Intravenous Every 8 hours 05/01/18 1201 05/06/18 1153   04/30/18 1100  cefTRIAXone (ROCEPHIN) 2 g in sodium chloride 0.9 % 100 mL IVPB  Status:  Discontinued     2 g 200 mL/hr over 30 Minutes Intravenous Every 24 hours 04/29/18 1416 05/06/18 1153   04/29/18 1430  cefTRIAXone (ROCEPHIN) 1 g in sodium chloride 0.9 % 100 mL IVPB     1 g 200 mL/hr over 30 Minutes Intravenous Every 24 hours 04/29/18 1416 04/30/18 1429   04/29/18 1130  cefTRIAXone (ROCEPHIN) 1 g in sodium chloride 0.9 % 100 mL IVPB     1 g 200 mL/hr over 30 Minutes Intravenous  Once 04/29/18 1118 04/29/18 1251       Assessment/Plan Hx of prior lung surgery Acute metabolic encephalopathy Anemia- transfused 10/14 - 6.9/21 today Dr. 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  X-ray appears to show more of a colonic "ileus" than small bowel ileus.  He has good bowel sounds today and minimal NGT output.  Since NGT just placed yesterday, will clamp this today.  If he does well, will plan to DC tomorrow and start diet. -mobilize as able.  Mostly bed bound, but would like to get him up to a chair if possible. -cont TNA for nutritional support at this time.   ID -rocephin 10/4>>10/11, flagyl 10/6>>10/11 FEN -IVF,NGT, clamped VTE -SCDs, SQ heparin Foley -none    LOS: 18 days  Letha Cape , [facility] Surgery 05/17/2018, 9:08 AM Pager: [phone]

## 2018-05-18 DIAGNOSIS — F209 Schizophrenia, unspecified: Secondary | ICD-10-CM

## 2018-05-18 DIAGNOSIS — K567 Ileus, unspecified: Secondary | ICD-10-CM

## 2018-05-18 LAB — COMPREHENSIVE METABOLIC PANEL
ALT: 16 U/L (ref 0–44)
ANION GAP: 7 (ref 5–15)
AST: 12 U/L — ABNORMAL LOW (ref 15–41)
Albumin: 2.2 g/dL — ABNORMAL LOW (ref 3.5–5.0)
Alkaline Phosphatase: 149 U/L — ABNORMAL HIGH (ref 38–126)
BUN: 23 mg/dL (ref 8–23)
CO2: 23 mmol/L (ref 22–32)
Calcium: 8.6 mg/dL — ABNORMAL LOW (ref 8.9–10.3)
Chloride: 107 mmol/L (ref 98–111)
Creatinine, Ser: 0.61 mg/dL (ref 0.61–1.24)
GFR calc non Af Amer: >60 mL/min (ref >60)
GLUCOSE: 104 mg/dL — AB (ref 70–99)
POTASSIUM: 4.2 mmol/L (ref 3.5–5.1)
SODIUM: 137 mmol/L (ref 135–145)
Total Bilirubin: 0.4 mg/dL (ref 0.3–1.2)
Total Protein: 6.2 g/dL — ABNORMAL LOW (ref 6.5–8.1)

## 2018-05-18 LAB — MAGNESIUM: Magnesium: 2 mg/dL (ref 1.7–2.4)

## 2018-05-18 LAB — GLUCOSE, CAPILLARY: Glucose-Capillary: 107 mg/dL — ABNORMAL HIGH (ref 70–99)

## 2018-05-18 LAB — PHOSPHORUS: PHOSPHORUS: 3.9 mg/dL (ref 2.5–4.6)

## 2018-05-18 MED ORDER — TRAVASOL 10 % IV SOLN
INTRAVENOUS | Status: AC
  Administered 2018-05-18: 18:00:00 via INTRAVENOUS
  Filled 2018-05-18: qty 936

## 2018-05-18 MED ORDER — ACETAMINOPHEN 325 MG PO TABS
650.0000 mg | ORAL_TABLET | Freq: Four times a day (QID) | ORAL | Status: DC | PRN
  Filled 2018-05-18: qty 2

## 2018-05-18 NOTE — Progress Notes (Signed)
Patient ID: Jeffrey Davila, male   DOB: Oct 08, 1950, 67 y.o.   MRN: [id number]    8 Days Post-Op  Subjective: Patient only answers yes.  Unclear if he has had a BM.  NGT was left to suction yesterday even though order was to clamp it.  Objective: Vital signs in last 24 hours: Temp:  [98.4 F (36.9 C)-99.2 F (37.3 C)] 98.5 F (36.9 C) (10/23 0815) Pulse Rate:  [73-78] 78 (10/23 0815) Resp:  [13-16] 13 (10/23 0815) BP: (94-102)/(68) 102/68 (10/23 0815) SpO2:  [99 %-100 %] 100 % (10/23 0815) Last BM Date: (none since surgery per pt's sister)  Intake/Output from previous day: 10/22 0701 - 10/23 0700 In: 1023.9 [I.V.:918.9; IV Piggyback:105] Out: 2300 [Urine:1650; Emesis/NG output:650] Intake/Output this shift: No intake/output data recorded.  PE: Abd: soft, concave, great BS, incisions c/d/i with staples present.  Seems NT  Lab Results:  Recent Labs    05/15/18 1430 05/16/18 0401  WBC  --  8.3  HGB 9.1* 8.6*  HCT 28.7* 27.5*  PLT  --  608*   BMET Recent Labs    05/17/18 0412 05/18/18 0441  NA 136 137  K 4.1 4.2  CL 107 107  CO2 23 23  GLUCOSE 100* 104*  BUN 22 23  CREATININE 0.61 0.61  CALCIUM 8.7* 8.6*   PT/INR No results for input(s): LABPROT, INR in the last 72 hours. CMP     Component Value Date/Time   NA 137 05/18/2018 0441   K 4.2 05/18/2018 0441   CL 107 05/18/2018 0441   CO2 23 05/18/2018 0441   GLUCOSE 104 (H) 05/18/2018 0441   BUN 23 05/18/2018 0441   CREATININE 0.61 05/18/2018 0441   CALCIUM 8.6 (L) 05/18/2018 0441   PROT 6.2 (L) 05/18/2018 0441   ALBUMIN 2.2 (L) 05/18/2018 0441   AST 12 (L) 05/18/2018 0441   ALT 16 05/18/2018 0441   ALKPHOS 149 (H) 05/18/2018 0441   BILITOT 0.4 05/18/2018 0441   GFRNONAA >60 05/18/2018 0441   GFRAA >60 05/18/2018 0441   Lipase     Component Value Date/Time   LIPASE 204 (H) 04/29/2018 0904       Studies/Results: Dg Abd 1 View  Result Date: 05/16/2018 CLINICAL DATA:  Feeding tube advanced  today. EXAM: ABDOMEN - 1 VIEW COMPARISON:  Plain films of the abdomen dated 05/15/2018 and 05/14/2018. FINDINGS: Enteric tube is coiled in the stomach with tip at the level of the gastric fundus/cardia. Distended gas-filled loops of large and small bowel are again appreciated throughout the abdomen and upper pelvis. IMPRESSION: 1. Feeding tube is coiled in the stomach with tip at the level of the gastric fundus/cardia. 2. Persistent gaseous distention of both large and small bowel loops throughout the abdomen and pelvis, compatible with previous reports of obstruction or ileus. Electronically Signed   By: Bary Richard M.D.   On: 05/16/2018 20:26   Dg Abd Portable 1v  Result Date: 05/17/2018 CLINICAL DATA:  Small-bowel obstruction. EXAM: PORTABLE ABDOMEN - 1 VIEW COMPARISON:  05/16/2018.  05/15/2018.  CT 04/30/2018. FINDINGS: Surgical staples noted over the abdomen. NG tube noted with its tip in the stomach. Distended loops of small and large bowel are again noted. Similar findings noted on prior exam. No free air. Degenerative change in osteopenia lumbar spine and both hips. Prostate calcifications are noted. Aortoiliac atherosclerotic vascular calcification. Diffuse osteopenia degenerative changes lumbar spine and both hips. Mild blunting of the right costophrenic angle again noted suggesting scarring and/or  tiny effusion. IMPRESSION: NG tube noted with its tip in the stomach. Dilated loops of small and large bowel are again noted without significant interim change. No free air identified. Electronically Signed   By: Maisie Fus  Register   On: 05/17/2018 07:32    Anti-infectives: Anti-infectives (From admission, onward)   Start     Dose/Rate Route Frequency Ordered Stop   05/10/18 0941  ceFAZolin (ANCEF) 2-4 GM/100ML-% IVPB    Note to Pharmacy:  [name]   : cabinet override      05/10/18 0941 05/10/18 2144   05/01/18 1215  metroNIDAZOLE (FLAGYL) IVPB 500 mg  Status:  Discontinued     500 mg 100  mL/hr over 60 Minutes Intravenous Every 8 hours 05/01/18 1201 05/06/18 1153   04/30/18 1100  cefTRIAXone (ROCEPHIN) 2 g in sodium chloride 0.9 % 100 mL IVPB  Status:  Discontinued     2 g 200 mL/hr over 30 Minutes Intravenous Every 24 hours 04/29/18 1416 05/06/18 1153   04/29/18 1430  cefTRIAXone (ROCEPHIN) 1 g in sodium chloride 0.9 % 100 mL IVPB     1 g 200 mL/hr over 30 Minutes Intravenous Every 24 hours 04/29/18 1416 04/30/18 1429   04/29/18 1130  cefTRIAXone (ROCEPHIN) 1 g in sodium chloride 0.9 % 100 mL IVPB     1 g 200 mL/hr over 30 Minutes Intravenous  Once 04/29/18 1118 04/29/18 1251       Assessment/Plan Hx of prior lung surgery Acute metabolic encephalopathy Anemia- transfused 10/14 - 6.9/21 today Dr. 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  He has great BS today.  Will clamp and allow some clears from the floor.  We are going slow given the fragility of the situation currently with his sister. -mobilize as able.  Mostly bed bound, but would like to get him up to a chair if possible. -cont TNA for nutritional support at this time.   ID -rocephin 10/4>>10/11, flagyl 10/6>>10/11 FEN -IVF,NGT, clamped VTE -SCDs, SQ heparin Foley -condom cath   LOS: 19 days    Letha Cape , [facility] Surgery 05/18/2018, 10:59 AM Pager: [phone]

## 2018-05-18 NOTE — Progress Notes (Signed)
PROGRESS NOTE        PATIENT DETAILS Name: Jeffrey Davila Age: 67 y.o. Sex: male Date of Birth: [birth year] Admit Date: 04/29/2018 Admitting Physician Starleen Arms, MD PCP:Uba, Reuel Boom, MD  Brief Narrative: Patient is a 67 y.o. male with history of schizophrenia/?  Cognitive dysfunction (dementia), hypertension, BPH, remote history (in the 1990s) of trauma causing small bowel injury requiring laparotomy-mostly wheelchair-bound-presented to the hospital with approximately 1 week history of intermittent vomiting, confusion for a few days prior to this hospital admission-presented with sepsis secondary to UTI, vomiting and significant constipation.  Patient was started on IV antibiotics and admitted to the hospital service,initial imaging was suggestive of constipation causing gastric distention-NG tube was placed, however upon further evaluation with a CT scan he was found  to have a small bowel obstruction.  General surgery was consulted, even with n.p.o. status/NG tube decompression-he continues to have SBO-patient's sister has refused to sign consent for surgery and wanted patient to receive several days of TNA before she consented to surgery.  Subsequently underwent laparotomy with lysis of adhesions on 10/15, postoperative course complicated by ileus and low-grade fever.  See below for further details.  Subjective: Lying comfortably in bed-claims he has passed flatus overnight.  Denies any abdominal pain.  Assessment/Plan: Small bowel obstruction: Underwent laparotomy with lysis of adhesions on 10/15-postoperative course complicated by ileus.  NG tube remains in place-plans are to clamp and start clear liquids.  Abdominal exam is benign-bowel sounds are present-CCS following and directing care.  Low-grade fever on 10/19: No foci of infection apparent-could have been from atelectasis-not on any antimicrobial therapy.  Blood culture on 10/20 neg so far, chest x-ray  on 10/20 neg for PNA.  Remains afebrile since then.  Follow.  Sepsis secondary to complicated UTI: Sepsis pathophysiology has resolved, urine culture positive for Klebsiella-has completed a course of IV Rocephin.  Blood cultures remain negative.    Aspiration pneumonia: This occurred in the early part of his hospitalization-felt to be secondary to o SBO/vomiting with underlying frailty.  Has completed a course of antimicrobial therapy.  Continue to mobilize as much as possible.    Acute metabolic encephalopathy: Improved and back to his usual baseline.  Encephalopathy was present on initial presentation-this was felt secondary to UTI, and developing SBO.Marland Kitchen  Anemia: Suspect anemia secondary to acute illness, and IV fluid dilution.  Has been transfused a total of 3 units so far, last transfusion on 10/20.  Follow CBC periodically.  No indication of any overt blood loss at this time.   AKI: Hemodynamically mediated, resolved.  Hypernatremia: Secondary to vomiting/dehydration-resolved.  Hyperkalemia: Resolved  Schizophrenia: Appears stable-continue IV Depakote and olanzapine.  NG tube is been clamped-restart Cogentin.   Hypertension: Blood pressure appears stable-continue to hold antihypertensives.   Severe malnutrition: Continue TNA-we will start low-dose nutritional supplements when diet is more stable.    Deconditioning/debility: Suspect has significant amount of debility at baseline-he is very cachectic-deconditioning/debility has worsened due to acute illness/SBO.  PT following.  Other issues: This MD is familiar with this patient-I took care of during the initial part of his hospital stay.  Patient's sister initially wanted guarantees with surgery, wanted transfer to [facility] (refused by [facility]).  See my prior notes.  I have reviewed notes by Dr. Marena Chancy issues with patient's sister refusing NG tube placement, wanting transfer to [facility] (refused  [facility] [place] [facility]  [place]).  Per nursing staff-she has on occasion been very disruptive to their workflow and in the care of the patient.  I completely agree with Dr. Marena Chancy concerns whether patient's sister is the best person to be making decisions for this patient-given numerous documentations by nursing staff and by prior MDs.  Social work, risk Insurance account manager, Camera operator and palliative care following.  I will continue to engage/educate and discuss patient's care with the patient's sister when she arrives at the hospital.  DVT Prophylaxis: Prophylactic Heparin   Code Status: Full code   Family Communication: None at bedside  Disposition Plan: Remain inpatient-not stable for discharge  Antimicrobial agents: Anti-infectives (From admission, onward)   Start     Dose/Rate Route Frequency Ordered Stop   05/10/18 0941  ceFAZolin (ANCEF) 2-4 GM/100ML-% IVPB    Note to Pharmacy:  [name]   : cabinet override      05/10/18 0941 05/10/18 2144   05/01/18 1215  metroNIDAZOLE (FLAGYL) IVPB 500 mg  Status:  Discontinued     500 mg 100 mL/hr over 60 Minutes Intravenous Every 8 hours 05/01/18 1201 05/06/18 1153   04/30/18 1100  cefTRIAXone (ROCEPHIN) 2 g in sodium chloride 0.9 % 100 mL IVPB  Status:  Discontinued     2 g 200 mL/hr over 30 Minutes Intravenous Every 24 hours 04/29/18 1416 05/06/18 1153   04/29/18 1430  cefTRIAXone (ROCEPHIN) 1 g in sodium chloride 0.9 % 100 mL IVPB     1 g 200 mL/hr over 30 Minutes Intravenous Every 24 hours 04/29/18 1416 04/30/18 1429   04/29/18 1130  cefTRIAXone (ROCEPHIN) 1 g in sodium chloride 0.9 % 100 mL IVPB     1 g 200 mL/hr over 30 Minutes Intravenous  Once 04/29/18 1118 04/29/18 1251      Procedures: None  CONSULTS:  None  Time spent: 25 minutes minutes-Greater than 50% of this time was spent in counseling, explanation of diagnosis, planning of further management, and coordination of care.  MEDICATIONS: Scheduled Meds: . benztropine  1  mg Oral BID  . Chlorhexidine Gluconate Cloth  6 each Topical 2 times per day on Tue  . heparin  5,000 Units Subcutaneous Q8H  . lip balm  1 application Topical BID  . OLANZapine zydis  10 mg Oral BID  . polyvinyl alcohol  1 drop Both Eyes TID   Continuous Infusions: . sodium chloride 1,000 mL (05/14/18 1850)  . lactated ringers 10 mL/hr at 05/11/18 0617  . TPN ADULT (ION) 65 mL/hr at 05/18/18 0300  . TPN ADULT (ION)    . valproate sodium Stopped (05/18/18 0450)   PRN Meds:.sodium chloride, acetaminophen, albuterol, alum & mag hydroxide-simeth, bisacodyl, diphenhydrAMINE, magic mouthwash, menthol-cetylpyridinium, metoprolol tartrate, ondansetron (ZOFRAN) IV, phenol, sodium chloride flush   PHYSICAL EXAM: Vital signs: Vitals:   05/17/18 2015 05/18/18 0015 05/18/18 0415 05/18/18 0815  BP:  95/68 94/68 102/68  Pulse:  73 73 78  Resp:  16 14 13   Temp: 99.2 F (37.3 C) 98.9 F (37.2 C)  98.5 F (36.9 C)  TempSrc: Oral Oral  Oral  SpO2:  99% 100% 100%  Weight:      Height:       Filed Weights   04/29/18 0842  Weight: 49.9 kg   Body mass index is 17.75 kg/m.   General appearance:Awake, alert, not in any distress.  Eyes:no scleral icterus. HEENT: Atraumatic and Normocephalic Neck: supple, no JVD. Resp:Good air entry bilaterally,no rales  or rhonchi CVS: S1 S2 regular, no murmurs.  GI: Bowel sounds present, Non tender-laparotomy scar present. Extremities: B/L Lower Ext shows no edema, both legs are warm to touch Neurology:  Non focal but appears to have generalized weakness. Musculoskeletal:No digital cyanosis Skin:No Rash, warm and dry Wounds:N/A  I have personally reviewed following labs and imaging studies  LABORATORY DATA: CBC: Recent Labs  Lab 05/12/18 1256 05/13/18 0407 05/14/18 0403 05/15/18 0419 05/15/18 1430 05/16/18 0401  WBC 14.4* 14.7* 10.4 8.0  --  8.3  NEUTROABS  --   --   --   --   --  5.6  HGB 7.6* 7.4* 7.0* 6.9* 9.1* 8.6*  HCT 24.3* 24.0*  22.4* 21.8* 28.7* 27.5*  MCV 94.9 95.2 95.3 95.2  --  90.8  PLT 566* 587* 576* 586*  --  608*    Basic Metabolic Panel: Recent Labs  Lab 05/12/18 0336 05/13/18 0407 05/15/18 0419 05/16/18 0401 05/17/18 0412 05/18/18 0441  NA 129* 132* 138 138 136 137  K 4.9 4.1 3.8 4.0 4.1 4.2  CL 102 103 109 110 107 107  CO2 23 20* 23 24 23 23   GLUCOSE 96 106* 109* 99 100* 104*  BUN 23 20 21 21 22 23   CREATININE 0.62 0.73 0.56* 0.62 0.61 0.61  CALCIUM 8.5* 8.7* 8.4* 8.5* 8.7* 8.6*  MG 1.7 1.9 1.9 2.0 2.1 2.0  PHOS 3.1  --   --  3.6 3.5 3.9    GFR: Estimated Creatinine Clearance: 64.1 mL/min (by C-G formula based on SCr of 0.61 mg/dL).  Liver Function Tests: Recent Labs  Lab 05/12/18 0336 05/15/18 0419 05/16/18 0401 05/17/18 0412 05/18/18 0441  AST 17 19 15  14* 12*  ALT 11 17 17 18 16   ALKPHOS 88 119 142* 168* 149*  BILITOT 0.5 0.2* 0.4 0.4 0.4  PROT 6.2* 5.8* 6.1* 6.1* 6.2*  ALBUMIN 2.6* 2.0* 2.1* 2.1* 2.2*   No results for input(s): LIPASE, AMYLASE in the last 168 hours. No results for input(s): AMMONIA in the last 168 hours.  Coagulation Profile: Recent Labs  Lab 05/13/18 0811  INR 1.39    Cardiac Enzymes: No results for input(s): CKTOTAL, CKMB, CKMBINDEX, TROPONINI in the last 168 hours.  BNP (last 3 results) No results for input(s): PROBNP in the last 8760 hours.  HbA1C: No results for input(s): HGBA1C in the last 72 hours.  CBG: Recent Labs  Lab 05/15/18 2145 05/16/18 0746 05/16/18 1757 05/17/18 1203 05/18/18 0815  GLUCAP 107* 95 96 96 107*    Lipid Profile: Recent Labs    05/16/18 0401  TRIG 67    Thyroid Function Tests: No results for input(s): TSH, T4TOTAL, FREET4, T3FREE, THYROIDAB in the last 72 hours.  Anemia Panel: No results for input(s): VITAMINB12, FOLATE, FERRITIN, TIBC, IRON, RETICCTPCT in the last 72 hours.  Urine analysis:    Component Value Date/Time   COLORURINE YELLOW 05/15/2018 1109   APPEARANCEUR CLEAR 05/15/2018 1109     LABSPEC 1.013 05/15/2018 1109   PHURINE 7.0 05/15/2018 1109   GLUCOSEU NEGATIVE 05/15/2018 1109   HGBUR SMALL (A) 05/15/2018 1109   BILIRUBINUR NEGATIVE 05/15/2018 1109   KETONESUR NEGATIVE 05/15/2018 1109   PROTEINUR NEGATIVE 05/15/2018 1109   UROBILINOGEN 1.0 06/08/2010 1523   NITRITE NEGATIVE 05/15/2018 1109   LEUKOCYTESUR TRACE (A) 05/15/2018 1109    Sepsis Labs: Lactic Acid, Venous    Component Value Date/Time   LATICACIDVEN 1.07 04/29/2018 1334    MICROBIOLOGY: Recent Results (from the past 240 hour(s))  Surgical PCR screen     Status: None   Collection Time: 05/09/18 11:29 PM  Result Value Ref Range Status   MRSA, PCR NEGATIVE NEGATIVE Final   Staphylococcus aureus NEGATIVE NEGATIVE Final    Comment: (NOTE) The Xpert SA Assay (FDA approved for NASAL specimens in patients 66 years of age and older), is one component of a comprehensive surveillance program. It is not intended to diagnose infection nor to guide or monitor treatment. Performed at [facility], [address] [place], [place] [address]   Culture, blood (routine x 2)     Status: None (Preliminary result)   Collection Time: 05/15/18  3:15 PM  Result Value Ref Range Status   Specimen Description BLOOD SITE NOT SPECIFIED  Final   Special Requests   Final    BOTTLES DRAWN AEROBIC ONLY Blood Culture results may not be optimal due to an inadequate volume of blood received in culture bottles   Culture   Final    NO GROWTH 3 DAYS Performed at [facility], [address], [place], [place] [address]    Report Status PENDING  Incomplete  Culture, blood (routine x 2)     Status: None (Preliminary result)   Collection Time: 05/15/18  3:22 PM  Result Value Ref Range Status   Specimen Description BLOOD SITE NOT SPECIFIED  Final   Special Requests   Final    BOTTLES DRAWN AEROBIC ONLY Blood Culture results may not be optimal due to an inadequate volume of blood received in culture bottles    Culture   Final    NO GROWTH 3 DAYS Performed at [facility], [address] [place], [place] [address]    Report Status PENDING  Incomplete    RADIOLOGY STUDIES/RESULTS: Ct Abdomen Pelvis Wo Contrast  Result Date: 04/30/2018 CLINICAL DATA:  Nausea, bilious vomiting EXAM: CT ABDOMEN AND PELVIS WITHOUT CONTRAST TECHNIQUE: Multidetector CT imaging of the abdomen and pelvis was performed following the standard protocol without IV contrast. Sagittal and coronal MPR images reconstructed from axial data set. Oral contrast was not administered. COMPARISON:  None FINDINGS: Lower chest: Emphysematous changes with BILATERAL lower lobe infiltrates consistent with either pneumonia or aspiration. Hepatobiliary: Liver unremarkable. Gallbladder not well visualized due to streak artifacts, grossly unremarkable. Pancreas: Normal appearance Spleen: Normal appearance Adrenals/Urinary Tract: Question adrenal thickening bilaterally. No obvious renal mass or hydronephrosis. Ureters not visualized. Foley catheter decompresses urinary bladder. Stomach/Bowel: Nasogastric tube in stomach. Low-attenuation fluid distends stomach and multiple proximal small bowel loops, with small bowel loops up to 4.1 cm diameter. Distal small bowel loops and colon are decompressed. Scattered stool throughout colon. Findings likely represent mid small bowel obstruction. Appendix not visualized. Suspected rectal wall thickening. Vascular/Lymphatic: Atherosclerotic calcification aorta. Aorta normal caliber. Reproductive: Minimal prostatic enlargement and scattered prostatic calcifications. Other: No definite free air or free fluid. Nonspecific stranding of presacral fat. Musculoskeletal: No acute osseous findings. IMPRESSION: Dilated proximal and decompressed distal small bowel loops compatible with small bowel obstruction, likely in the mid small bowel. Due to lack of fat planes, streak artifacts, and lack of contrast, unable to localize site  of obstruction. No evidence of perforation/free air. Question rectal wall thickening, recommend correlation with proctoscopy. Emphysematous changes with bibasilar pulmonary infiltrates either representing pneumonia or aspiration. Electronically Signed   By: Ulyses Southward M.D.   On: 04/30/2018 19:38   Dg Abd 1 View  Result Date: 05/16/2018 CLINICAL DATA:  Feeding tube advanced today. EXAM: ABDOMEN -  1 VIEW COMPARISON:  Plain films of the abdomen dated 05/15/2018 and 05/14/2018. FINDINGS: Enteric tube is coiled in the stomach with tip at the level of the gastric fundus/cardia. Distended gas-filled loops of large and small bowel are again appreciated throughout the abdomen and upper pelvis. IMPRESSION: 1. Feeding tube is coiled in the stomach with tip at the level of the gastric fundus/cardia. 2. Persistent gaseous distention of both large and small bowel loops throughout the abdomen and pelvis, compatible with previous reports of obstruction or ileus. Electronically Signed   By: Bary Richard M.D.   On: 05/16/2018 20:26   Dg Abd 1 View  Result Date: 05/03/2018 CLINICAL DATA:  Small bowel obstruction EXAM: ABDOMEN - 1 VIEW COMPARISON:  05/02/2018 FINDINGS: NG tube is in the stomach. Gaseous distention of the stomach and left abdominal small bowel loops. Gas and stool within nondistended colon. No free air or organomegaly. IMPRESSION: Gaseous distention of stomach and left abdominal small bowel loops likely reflecting small bowel obstruction. NG tube is in the stomach. Electronically Signed   By: Charlett Nose M.D.   On: 05/03/2018 10:05   Dg Abd 1 View  Result Date: 05/02/2018 CLINICAL DATA:  Small bowel obstruction EXAM: ABDOMEN - 1 VIEW COMPARISON:  May 01, 2018 abdominal radiograph and CT abdomen and pelvis April 30, 2018 FINDINGS: Nasogastric tube no longer present. There is slightly less bowel dilatation in the left upper quadrant compared to 1 day prior. There is moderate air in the stomach. No  bowel dilatation elsewhere. No air-fluid levels. No free air. There is moderate stool in the colon. There is atelectatic change in the right lung base. IMPRESSION: Nasogastric tube no longer appreciable. Less small bowel dilatation in the left upper quadrant. Question resolving bowel obstruction. No free air evident. Electronically Signed   By: Bretta Bang III M.D.   On: 05/02/2018 14:20   Ct Head Wo Contrast  Result Date: 04/29/2018 CLINICAL DATA:  Altered level of consciousness increased over last 2 days, 2 episodes of nausea and vomiting, history of prior brain injury, dementia, former smoker, hypertension EXAM: CT HEAD WITHOUT CONTRAST TECHNIQUE: Contiguous axial images were obtained from the base of the skull through the vertex without intravenous contrast. COMPARISON:  06/08/2010 FINDINGS: Brain: Generalized atrophy. Normal ventricular morphology. No midline shift or mass effect. Small vessel chronic ischemic changes of deep cerebral white matter. 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 No intracranial hemorrhage, mass lesion, evidence of acute infarction, or extra-axial fluid collection. Vascular: Minimal atherosclerotic calcification of internal carotid arteries at skull base Skull: Intact Sinuses/Orbits: Clear Other: N/A IMPRESSION: Atrophy with small vessel chronic ischemic changes of deep cerebral white matter. 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 No acute intracranial abnormalities. Electronically Signed   By: Ulyses Southward M.D.   On: 04/29/2018 09:58   Dg Chest Port 1 View  Result Date: 05/15/2018 CLINICAL DATA:  Shortness of breath. Patient 5 days postop bowel resection with lysis of adhesions. EXAM: PORTABLE CHEST 1 VIEW COMPARISON:  05/11/2018 FINDINGS: Right-sided PICC line with tip over the SVC. Lungs are adequately inflated with subtle left retrocardiac opacification which may be due to vascular crowding or atelectasis and less likely infection. Remainder of  the lungs are clear. Cardiomediastinal silhouette is within normal. Persistent evidence of free peritoneal air compatible with patient's recent surgery. IMPRESSION: Mild left base opacification likely atelectasis or vascular crowding and less likely infection. Persistent free peritoneal air compatible recent postop state. Right-sided PICC line with  tip over the SVC. Electronically Signed   By: Elberta Fortis M.D.   On: 05/15/2018 09:38   Dg Chest Port 1 View  Result Date: 05/11/2018 CLINICAL DATA:  Shortness of breath, hypertension, pneumonia, dementia, former smoker EXAM: PORTABLE CHEST 1 VIEW COMPARISON:  Portable exam 0945 hours compared to 05/09/2018 FINDINGS: Nasogastric tube extends into stomach. RIGHT arm PICC line tip projects over SVC. Normal heart size and pulmonary vascularity. Elongation of thoracic aorta. Lungs appear emphysematous but clear. No pleural effusion or pneumothorax. Bones demineralized. Free air identified under the hemidiaphragms bilaterally; EHR indicates patient underwent a laparoscopic procedure on 05/10/2018, small-bowel resection and lysis of adhesions for small-bowel obstruction prior operative note. IMPRESSION: COPD changes without infiltrate. Free air consistent with abdominal surgery 1 day ago. Electronically Signed   By: Ulyses Southward M.D.   On: 05/11/2018 10:21   Dg Chest Port 1 View  Result Date: 05/09/2018 CLINICAL DATA:  Cough EXAM: PORTABLE CHEST 1 VIEW COMPARISON:  05/04/2018 FINDINGS: NG tube is stable. Right upper extremity PICC placed. Tip is at the cavoatrial junction. Lungs remain hyperaerated and clear. Small right pleural effusion is stable. No pneumothorax. Aorta remains somewhat prominent due to tortuosity and rotation of the thorax. IMPRESSION: New right upper extremity PICC with its tip at the cavoatrial junction. No active cardiopulmonary disease. Electronically Signed   By: Jolaine Click M.D.   On: 05/09/2018 07:37   Dg Chest Port 1 View  Result Date:  04/29/2018 CLINICAL DATA:  Altered mental status EXAM: PORTABLE CHEST 1 VIEW COMPARISON:  April 12, 2018 FINDINGS: There is mild scarring in the lateral right base and in the medial right lower lung region. There is no edema or consolidation. The heart size and pulmonary vascularity are normal. No adenopathy. There is a recent appearing fracture of the posterior right seventh rib with alignment essentially anatomic. An older fracture of the posterior right eighth rib is noted. No pneumothorax. IMPRESSION: Recent appearing fracture posterior right seventh rib. Older fracture posterior right eighth rib noted. Areas of mild scarring noted on the right. No edema or consolidation. Stable cardiac silhouette. Electronically Signed   By: Bretta Bang III M.D.   On: 04/29/2018 08:46   Dg Chest Port 1v Same Day  Result Date: 05/01/2018 CLINICAL DATA:  history of schizophrenia, hypertension, BPH-mostly wheelchair-bound-presented to the hospital with approximately 1 week history of intermittent vomiting, confusion for a few days prior to this hospital admission-presented with sepsis secondary to UTI, and significant constipation. C/o SOB EXAM: PORTABLE CHEST - 1 VIEW SAME DAY COMPARISON:  04/29/2018 FINDINGS: Lungs are hyperinflated. Subsegmental atelectasis or early infiltrate at the left lung base, new since previous. Right lung clear. Heart size normal.  Tortuous ectatic thoracic aorta. No effusion. No pneumothorax. Right anterolateral fourth rib fracture, age indeterminate. Nasogastric tube extends to the stomach. IMPRESSION: 1. New patchy infiltrate or subsegmental atelectasis at the left lung base. 2. Right fourth rib fracture without pneumothorax. 3. Nasogastric tube placement to the stomach. Electronically Signed   By: Corlis Leak M.D.   On: 05/01/2018 08:24   Dg Abd 2 Views  Result Date: 05/05/2018 CLINICAL DATA:  Vomiting.  History of right lumpectomy. EXAM: ABDOMEN - 2 VIEW COMPARISON:  05/04/2018  FINDINGS: Improve dilation of small bowel loops with residual borderline gas-filled distended small bowel loops in the left upper quadrant of the abdomen. Normal colonic bowel gas pattern. No evidence of free intra-abdominal gas. Normal cardiac silhouette. Tortuosity of the thoracic aorta. Right pleural reflection versus  small pleural effusion. No lobar airspace consolidation. Enteric catheter tip within the expected location the gastric body, side hole at the GE junction level. Right PICC line terminates in the expected location of the cavoatrial junction. IMPRESSION: Improved appearance of small-bowel obstruction. Enteric catheter with side hole at the level of the GE junction. Advancement may be considered. Possible small right pleural effusion. Electronically Signed   By: Ted Mcalpine M.D.   On: 05/05/2018 12:51   Dg Abd Acute W/chest  Result Date: 05/04/2018 CLINICAL DATA:  Follow-up small bowel obstruction. EXAM: DG ABDOMEN ACUTE W/ 1V CHEST COMPARISON:  Abdominal x-rays 05/04/1999 19 dating back to 04/29/2018. CT abdomen and pelvis 04/30/2018. Chest x-rays 05/01/2018, 04/12/2018. FINDINGS: Nasogastric tube tip in the fundus of the stomach. Persistent marked gaseous distension of the jejunum in the LEFT UPPER QUADRANT, slowly progressive over the past 3 days, with air-fluid levels on the LATERAL decubitus image. No evidence of free intraperitoneal air. Normal caliber colon with moderate stool burden. Calcified uterine fibroid in the pelvic midline. Cardiac silhouette normal in size, unchanged. Thoracic aorta tortuous and atherosclerotic. Hilar and mediastinal contours otherwise unremarkable. Interval improvement in aeration in the LEFT lung base, with only minimal patchy opacities persisting. Lungs otherwise clear. Emphysematous changes in both lungs with scarring in the RIGHT mid lung, unchanged. Pleuroparenchymal scarring at the RIGHT lung base which accounts for the blunting of the costophrenic  angle, unchanged dating back to the original 04/12/2018 examination. IMPRESSION: 1. Persistent partial small bowel obstruction which has slowly worsened over the past 3 days. 2. No evidence of free intraperitoneal air. 3. Improved aeration in the LEFT LOWER LOBE, with only mild atelectasis and/or pneumonia persisting. Electronically Signed   By: Hulan Saas M.D.   On: 05/04/2018 09:28   Dg Abd Portable 1v  Result Date: 05/17/2018 CLINICAL DATA:  Small-bowel obstruction. EXAM: PORTABLE ABDOMEN - 1 VIEW COMPARISON:  05/16/2018.  05/15/2018.  CT 04/30/2018. FINDINGS: Surgical staples noted over the abdomen. NG tube noted with its tip in the stomach. Distended loops of small and large bowel are again noted. Similar findings noted on prior exam. No free air. Degenerative change in osteopenia lumbar spine and both hips. Prostate calcifications are noted. Aortoiliac atherosclerotic vascular calcification. Diffuse osteopenia degenerative changes lumbar spine and both hips. Mild blunting of the right costophrenic angle again noted suggesting scarring and/or tiny effusion. IMPRESSION: NG tube noted with its tip in the stomach. Dilated loops of small and large bowel are again noted without significant interim change. No free air identified. Electronically Signed   By: Maisie Fus  Register   On: 05/17/2018 07:32   Dg Abd Portable 1v  Result Date: 05/15/2018 CLINICAL DATA:  NG tube placement EXAM: PORTABLE ABDOMEN - 1 VIEW COMPARISON:  05/15/2018 FINDINGS: NG tube is in placed into the fundus of the stomach. Continued gaseous distention of visualized upper abdominal bowel loops. IMPRESSION: NG tube in the fundus of the stomach Electronically Signed   By: Charlett Nose M.D.   On: 05/15/2018 20:11   Dg Abd Portable 1v  Result Date: 05/15/2018 CLINICAL DATA:  Small bowel obstruction. EXAM: PORTABLE ABDOMEN - 1 VIEW COMPARISON:  One-view abdomen 05/14/2018 FINDINGS: Patient's retained left.  Heart size normal.  Lung  bases are clear. Extend loops of large and small bowel are stable. There is no free air or pneumatosis. Surgical clips are noted. IMPRESSION: 1. Similar appearance of distended large and small bowel consistent with obstruction or ileus. Electronically Signed   By: Marin Roberts  M.D.   On: 05/15/2018 08:10   Dg Abd Portable 1v  Result Date: 05/14/2018 CLINICAL DATA:  Abdominal pain EXAM: PORTABLE ABDOMEN - 1 VIEW COMPARISON:  05/13/2018 FINDINGS: Interval NG tube removal. Some interval improvement in gaseous distention of the bowel. Stool and air throughout the transverse colon. Postop staples again noted. Air in the rectum. No significant interval change. Degenerative changes of the spine. Basilar chronic interstitial changes and atelectasis. IMPRESSION: NG tube removed. Stable nonspecific bowel gas pattern, suspect resolving obstruction or ileus. Little interval change. Electronically Signed   By: Judie Petit.  Shick M.D.   On: 05/14/2018 10:23   Dg Abd Portable 1v  Result Date: 05/13/2018 CLINICAL DATA:  Abdominal pain, check NG placement EXAM: PORTABLE ABDOMEN - 1 VIEW COMPARISON:  05/09/2018 FINDINGS: Nasogastric catheter is noted with the tip in the stomach. Proximal side port lies in the distal esophagus. This is stable from the prior exam. Scattered large and small bowel gas is noted. Postsurgical changes are now seen. Mild small bowel dilatation is noted which may be related to a postoperative ileus. IMPRESSION: Mild postoperative ileus. Nasogastric catheter as described. Electronically Signed   By: Alcide Clever M.D.   On: 05/13/2018 09:56   Dg Abd Portable 1v  Result Date: 05/09/2018 CLINICAL DATA:  Small-bowel obstruction EXAM: PORTABLE ABDOMEN - 1 VIEW COMPARISON:  05/08/2018; 05/06/2018; 05/05/2018 FINDINGS: Re-demonstrated gaseous distention of several loops of small bowel with index loop of small bowel within the left mid hemiabdomen measuring 3.6 cm in diameter. Moderate colonic stool  burden. No supine evidence of pneumoperitoneum. No pneumatosis or portal venous gas. Presumed calcified fibroid overlies the left hemipelvis. Stable position of support apparatus. No acute osseus abnormalities. IMPRESSION: No change to slight improvement in suspected small-bowel obstruction. Electronically Signed   By: Simonne Come M.D.   On: 05/09/2018 07:47   Dg Abd Portable 1v  Result Date: 05/08/2018 CLINICAL DATA:  Follow up small bowel obstruction EXAM: PORTABLE ABDOMEN - 1 VIEW COMPARISON:  05/06/2018 FINDINGS: Scattered large and small bowel gas is noted. Multiple dilated loops of small bowel are again identified and stable in appearance. Nasogastric catheter is again noted within the stomach. No free air is seen. Degenerative changes of the lumbar spine are noted. IMPRESSION: Persistent small bowel dilatation. No new focal abnormality is noted. Electronically Signed   By: Alcide Clever M.D.   On: 05/08/2018 08:36   Dg Abd Portable 1v  Result Date: 05/06/2018 CLINICAL DATA:  Small bowel obstruction EXAM: PORTABLE ABDOMEN - 1 VIEW COMPARISON:  Portable exam at 0628 hours compared to 05/05/2018 FINDINGS: Stool scattered throughout colon. Persistent dilatation of small bowel loops in the LEFT mid abdomen up to 4.9 cm diameter. No bowel wall thickening. Bones demineralized. No definite renal calcifications. IMPRESSION: Persistent small bowel dilatation consistent with small bowel obstruction. Little interval change. Electronically Signed   By: Ulyses Southward M.D.   On: 05/06/2018 10:21   Dg Abd Portable 1v-small Bowel Obstruction Protocol-initial, 8 Hr Delay  Result Date: 05/04/2018 CLINICAL DATA:  Small bowel obstruction protocol. 8 hour delay. EXAM: PORTABLE ABDOMEN - 1 VIEW COMPARISON:  05/03/2018 FINDINGS: Gaseous distention of left upper quadrant small bowel. Enteric tube with tip projected over the mid abdomen consistent with location in the upper stomach. Contrast material is demonstrated in the  dilated small bowel. No contrast material is identified in the colon. This is suggestive of high-grade obstruction. Degenerative changes in the spine. IMPRESSION: Contrast material is demonstrated in the dilated small bowel but  not in the colon consistent with high-grade obstruction. Electronically Signed   By: Burman Nieves M.D.   On: 05/04/2018 01:55   Dg Abd Portable 1v-small Bowel Obstruction Protocol-initial, 8 Hr Delay  Result Date: 05/01/2018 CLINICAL DATA:  Small bowel obstruction. EXAM: PORTABLE ABDOMEN - 1 VIEW COMPARISON:  Radiograph earlier this day at 1109 hour, CT yesterday. FINDINGS: Enteric tube in place with tip in the stomach, side-port just beyond the gastroesophageal junction. Improving small bowel dilatation in the left abdomen. Moderate stool in the right and transverse colon. No evidence of free air. Pelvic calcification may be stone in the urinary bladder or exophytic prostate calcification. IMPRESSION: Improving small bowel dilatation in the left abdomen since radiograph earlier this day. Electronically Signed   By: Narda Rutherford M.D.   On: 05/01/2018 21:14   Dg Abd Portable 1v-small Bowel Protocol-position Verification  Result Date: 05/01/2018 CLINICAL DATA:  Nasogastric tube placement EXAM: PORTABLE ABDOMEN - 1 VIEW COMPARISON:  Portable exam 1109 hours compared to 04/30/2018 FINDINGS: Tip of nasogastric tube projects over mid stomach. Dilated small bowel loops are seen in the LEFT mid abdomen. Increased stool in RIGHT colon. Lung bases appear emphysematous with subsegmental atelectasis at LEFT base. Bones demineralized. IMPRESSION: Tip of nasogastric tube projects over mid stomach. Small bowel dilatation in LEFT mid abdomen with increased stool in RIGHT colon. Electronically Signed   By: Ulyses Southward M.D.   On: 05/01/2018 11:22   Dg Abd Portable 1v  Result Date: 04/30/2018 CLINICAL DATA:  67 year old male with vomiting. EXAM: PORTABLE ABDOMEN - 1 VIEW COMPARISON:   04/29/2018 abdominal radiographs. FINDINGS: Portable AP semi upright and supine views at 0847 hours. Enteric tube has been placed and the stomach now appears decompressed. NG tube side hole at the gastric body. Non obstructed bowel gas pattern. Moderate volume of retained stool redemonstrated in the transverse colon and distal sigmoid/rectum. Negative visible lung bases. No pneumoperitoneum. Stable abdominal and pelvic visceral contours. Dystrophic calcification in the pelvis. Stable pelvic catheter, likely a Foley. No acute osseous abnormality identified. IMPRESSION: 1. NG tube in place with resolved gastric distention. 2. Non obstructed bowel gas pattern with moderate volume of retained stool. Electronically Signed   By: Odessa Fleming M.D.   On: 04/30/2018 09:06   Dg Abd Portable 1v  Result Date: 04/29/2018 CLINICAL DATA:  Nausea and vomiting EXAM: PORTABLE ABDOMEN - 1 VIEW COMPARISON:  None. FINDINGS: Gaseous distention of the stomach. Formed stool throughout the colon. No concerning mass effect or calcification. IMPRESSION: Prominent gaseous distension of the stomach. Constipated appearance. Electronically Signed   By: Marnee Spring M.D.   On: 04/29/2018 14:44   [place] Ekg Site Rite  Result Date: 05/04/2018 If Site Rite image not attached, placement could not be confirmed due to current cardiac rhythm.    LOS: 19 days   Jeoffrey Massed, MD  [facility]  If 7PM-7AM, please contact night-coverage  Please page via [email or web address]-click on MD name and type text message  05/18/2018, 11:52 AM

## 2018-05-18 NOTE — Plan of Care (Signed)
Patient is alert with episodes of confusion, no bowel movement recorded at this time, abdominal soft and tender, bowel sound hypoactive. Patient is alert and calm, NGT connected to low suction, no sign of distress noted.

## 2018-05-18 NOTE — Progress Notes (Signed)
[place] NOTE   Pharmacy Consult for TPN Indication: SBO  Patient Measurements: Height: _0  (167.6 cm) Weight: 110 lb (49.9 kg) IBW/kg (Calculated) : 63.8 TPN AdjBW (KG): 49.9 Body mass index is 17.75 kg/m.  Assessment:  41 YOM admitted on 10/4 with sepsis. He was found to have pneumonia and a possible SBO on CT on 10/5. Pt has a history of SBO s/p colectomy in 1993 (history (in the 1990s) of trauma causing small bowel injury requiring laparotomy). An NG tube was placed with improving small bowel dilatation but patient had 3-4 episodes of vomiting overnight on 10/7-10/8 and the NG tube had to be replaced due to incorrect placement. Pharmacy now consulted to manage TPN due to worsening SBO and concern for adhesions that require surgery.  Actual body is significantly below ideal body weight.  Of note, patient is extremely cachectic. Patient hx unreliable due to mental status.  GI: Pre-albumin down to 15.7. S/p ex-lap with LOA, SBR and anastomosis on 10/15. NGT not clamped as ordered 10/22, clamped 10/23 and allowing some clears, going slow per Surgery. +BS, no BM recorded. Xray w/ colonic "ileus".  Endo: No hx DM - CBGs controlled, SSI d/c'd 10/14 Lytes: All WNL (Goal K>/=4 and Mg>2/= with ileus) Renal: SCr stable WNL, UOP 1.4 cc/kg/hr, LR at [place]: RA Cards: VSS. Lasix IV x 1 on 10/20 Hepatobil: LFTs / Tbili / TG wnl, Lipase elevated 10/4, alk phos down to 149 Neuro: Schizophrenia. Dementia. Benztropine (held since 10/20), Zyprexa ODT, IV valproate ID: s/p abx for Klebsiella UTI and aspiration PNA. Afebrile, WBC WNL Heme: SQ heparin. Plts stable. Transfuse 10/20 > Hg low stable  TPN Access: double lumen PICC placed 10/10 TPN start date: 05/05/18  Nutritional Goals (per RD rec on 10/14): 1550-1750kCal, 85-100 gm protein, >1.5 L fluid per day  Current Nutrition:  TPN Clear liquids  Plan:  Continue TPN at 65 mL/hr, providing 94 g of  protein, 218 g of dextrose, and 55 g of lipids for a total of 1,663 kCals per day, meeting 100% of patient needs Electrolytes in TPN: Continue same; Cl:Ac ratio 1:1 Add MVI, trace elements, Pepcid 65m to TPN Continue to monitor CBGs and adjust as needed (SSI d/c'd 10/14) Continue LR at 10 ml/hr per MD F/U TPN labs in AM, plans for advancing diet / weaning TPN  HElicia Lamp PharmD, BCPS Clinical Pharmacist Clinical phone [phone] check AMION for all [place] numbers 05/18/2018 8:58 AM

## 2018-05-18 NOTE — Plan of Care (Signed)
Gradual progression. Patient communicates but is hard to understand. NG tube still intact but clamped per verbal order from Dr. Jerral Ralph. Sister called this morning. Update given, states she will be here later today. Will continue to follow as needed.

## 2018-05-18 NOTE — Care Management Note (Signed)
Case Management Note  Patient Details  Name: Jeffrey Davila MRN: [id number] Date of Birth: 1950/09/23  Subjective/Objective:  Small bowel obstruction, s/p Laparotomy, Asp PNA, Sepsis s/t UTI                   Action/Plan: LTAC referral. Contacted pt's sister, Jeffrey Davila for LTAC referral. Offered choice and she decided with [facility]. Contacted [facility] rep. Carina with new referral.   Expected Discharge Date:                 Expected Discharge Plan:  Long Term Acute Care (LTAC)  In-House Referral:  Clinical Social Work  Discharge planning Services  CM Consult  Post Acute Care Choice:  NA Choice offered to:  NA  DME Arranged:  N/A DME Agency:  NA  HH Arranged:  NA HH Agency:  NA  Status of Service:  In process, will continue to follow  If discussed at Long Length of Stay Meetings, dates discussed:    Additional Comments:  Elliot Cousin, RN 05/18/2018, 4:20 PM

## 2018-05-18 NOTE — Progress Notes (Signed)
NG tube clamped @ 8:55 per verbal order from Dr. Jerral Ralph.

## 2018-05-18 NOTE — Progress Notes (Signed)
Pt's sister educated on the risk of shaving patient while patient is taking heparin sub-Q. Pt's sister went against the nurses instruction and still implemented to shave patient herself. No adverse response noted have shaving. RN to continue to monitor.

## 2018-05-18 NOTE — Consult Note (Signed)
Consultation Note Date: 05/18/2018   Patient Name: Jeffrey Davila  DOB: 1951-02-15  MRN: [id number]  Age / Sex: 67 y.o., male  PCP: Jeffrey Lucks, MD Referring Physician: Jonetta Osgood, MD  Reason for Consultation: Establishing goals of care  HPI/Patient Profile: 67 y.o. male  with past medical history of gets of hernia, hypertension, BPH, remote trauma with small bowel injury requiring laparotomy who is mostly wheelchair-bound admitted on May 14, 2018 with small bowel obstruction who is status post surgery on 10/15.  Palliative consulted for goals of care.   Clinical Assessment and Goals of Care: Chart reviewed and discussed case with Dr. Candiss Davila as well as bedside RN.  Jeffrey Davila is a 68 year old male noted to not have capacity to make his own medical decisions who is currently admitted with small bowel obstruction and is status post surgery on 10/15.  His sister has been serving as his Ambulance person and chart review reveals concern about her ability to serve in this capacity.  I met with Jeffrey Davila in conjunction with Jeffrey Bras, RN for PMT.  He was alone in his room with tele-sitter present.  He is able to answer a few simple questions, however, he does not have insight into his overall medical condition.  He denies needs at this time and warm blanket was provided per his request.  NEXT OF KIN: Sister   SUMMARY OF RECOMMENDATIONS   -Family not present at the bedside.  We will plan for meeting with his sister when she is present tomorrow.  Noted during chart review regarding concern about his sister's ability to effectively serve as his surrogate decision-maker.  Ethics is already involved in case and plan noted for ethics team to discuss with SW and legal team.   Code Status/Advance Care Planning:  Full code   Palliative Prophylaxis:   Frequent Pain Assessment  Additional  Recommendations (Limitations, Scope, Preferences):  Full Scope Treatment  Psycho-social/Spiritual:   Desire for further Chaplaincy support:Did not address today  Additional Recommendations: Caregiving  Support/Resources  Prognosis:   Unable to determine  Discharge Planning: To Be Determined      Primary Diagnoses: Present on Admission: . Sepsis ([place]) . Bipolar disorder ([place]) . Schizophrenia ([place]) . Hypertension . Anxiety . Anemia   I have reviewed the medical record, interviewed the patient and family, and examined the patient. The following aspects are pertinent.  Past Medical History:  Diagnosis Date  . Anemia   . Anxiety   . Bipolar disorder ([place])   . Dementia ([place])   . Depression   . Family history of adverse reaction to anesthesia    "sister and mom:  hard to wake both up; sister died in 10/09/2007 but came back" (May 14, 2018)  . History of blood transfusion 1993; 2016-10-08   "both cause blood count was low" (May 14, 2018)  . Hypertension   . Pneumonia 10-08-2016   "septic" (05-14-2018)  . Schizophrenia [facility])    Social History   Socioeconomic History  . Marital status: Divorced    Spouse name:  Not on file  . Number of children: 2  . Years of education: Not on file  . Highest education level: Not on file  Occupational History  . Occupation: retired  Scientific laboratory technician  . Financial resource strain: Not on file  . Food insecurity:    Worry: Not on file    Inability: Not on file  . Transportation needs:    Medical: Not on file    Non-medical: Not on file  Tobacco Use  . Smoking status: Former Smoker    Packs/day: 0.10    Years: 20.00    Pack years: 2.00    Types: Cigarettes    Last attempt to quit: 1999    Years since quitting: 20.8  . Smokeless tobacco: Never Used  Substance and Sexual Activity  . Alcohol use: Never    Frequency: Never  . Drug use: Never  . Sexual activity: Not Currently  Lifestyle  . Physical activity:    Days per week: Not on file    Minutes  per session: Not on file  . Stress: Not on file  Relationships  . Social connections:    Talks on phone: Not on file    Gets together: Not on file    Attends religious service: Not on file    Active member of club or organization: Not on file    Attends meetings of clubs or organizations: Not on file    Relationship status: Not on file  Other Topics Concern  . Not on file  Social History Narrative  . Not on file   Family History  Problem Relation Age of Onset  . Colon cancer Father   . Prostate cancer Father   . Lung cancer Father   . Colon cancer Brother   . Diabetes Mother   . Hypertension Mother   . Diabetes Sister   . Kidney disease Brother   . Hypertension Sister        x 2   Scheduled Meds: . benztropine  1 mg Oral BID  . Chlorhexidine Gluconate Cloth  6 each Topical 2 times per day on Tue  . heparin  5,000 Units Subcutaneous Q8H  . lip balm  1 application Topical BID  . OLANZapine zydis  10 mg Oral BID  . polyvinyl alcohol  1 drop Both Eyes TID   Continuous Infusions: . sodium chloride 1,000 mL (05/14/18 1850)  . lactated ringers 10 mL/hr at 05/11/18 0617  . TPN ADULT (ION) 65 mL/hr at 05/18/18 0300  . valproate sodium Stopped (05/18/18 0450)   PRN Meds:.sodium chloride, acetaminophen, albuterol, alum & mag hydroxide-simeth, bisacodyl, diphenhydrAMINE, magic mouthwash, menthol-cetylpyridinium, metoprolol tartrate, ondansetron (ZOFRAN) IV, phenol, sodium chloride flush Medications Prior to Admission:  Prior to Admission medications   Medication Sig Start Date End Date Taking? Authorizing Provider  benztropine (COGENTIN) 1 MG tablet Take 1 tablet (1 mg total) by mouth 2 (two) times daily for 14 days. 04/12/18 04/29/18 Yes Tacy Learn, PA-C  divalproex (DEPAKOTE) 500 MG DR tablet Take 1 tablet (500 mg total) by mouth 2 (two) times daily for 14 days. 04/12/18 04/29/18 Yes Tacy Learn, PA-C  ibuprofen (ADVIL,MOTRIN) 800 MG tablet Take 800 mg by mouth as needed.    Yes [provider]  metoprolol tartrate (LOPRESSOR) 25 MG tablet Take 25 mg by mouth daily.  02/27/13  Yes [provider]  OLANZapine (ZYPREXA) 10 MG tablet Take 1 tablet (10 mg total) by mouth 2 (two) times daily for 14 days. 04/12/18  04/29/18 Yes Tacy Learn, PA-C  tamsulosin (FLOMAX) 0.4 MG CAPS capsule Take 0.4 mg by mouth daily.   Yes [provider]  Na Sulfate-K Sulfate-Mg Sulf ([place]) SOLN USE PER PREP INSTRUCTIONS Patient not taking: Reported on 04/29/2018 02/28/13   Sable Feil, MD   No Known Allergies Review of Systems Limited historian.  Denies pain, SOB.  Reports being cold.  Physical Exam General: Alert, awake, in no acute distress. Sleeping on entering room but arouses easily. HEENT: No bruits, no goiter, no JVD Heart: Regular Lungs: Good air movement Abdomen: Soft, nontender, nondistended, positive bowel sounds.  Ext: No significant edema Skin: Warm and dry  Vital Signs: BP 102/68 (BP Location: Left Arm)   Pulse 78   Temp 98.5 F (36.9 C) (Oral)   Resp 13   Ht '5\' 6"'  (1.676 m)   Wt 49.9 kg   SpO2 100%   BMI 17.75 kg/m  Pain Scale: 0-10 POSS *See Group Information*: S-Acceptable,Sleep, easy to arouse Pain Score: 5    SpO2: SpO2: 100 % O2 Device:SpO2: 100 % O2 Flow Rate: .O2 Flow Rate (L/min): 2 L/min  IO: Intake/output summary:   Intake/Output Summary (Last 24 hours) at 05/18/2018 0915 Last data filed at 05/18/2018 0600 Gross per 24 hour  Intake 1023.94 ml  Output 2300 ml  Net -1276.06 ml    LBM: Last BM Date: (none since surgery per pt's sister) Baseline Weight: Weight: 49.9 kg Most recent weight: Weight: 49.9 kg     Palliative Assessment/Data:   Time Total: 50 minutes Greater than 50%  of this time was spent counseling and coordinating care related to the above assessment and plan.  Signed by: Micheline Rough, MD   Please contact Palliative Medicine Team phone at [phone] for questions and  concerns.  For individual provider: See Shea Evans

## 2018-05-19 LAB — GLUCOSE, CAPILLARY
GLUCOSE-CAPILLARY: 106 mg/dL — AB (ref 70–99)
Glucose-Capillary: 106 mg/dL — ABNORMAL HIGH (ref 70–99)
Glucose-Capillary: 98 mg/dL (ref 70–99)

## 2018-05-19 LAB — COMPREHENSIVE METABOLIC PANEL
ALBUMIN: 2.3 g/dL — AB (ref 3.5–5.0)
ALK PHOS: 148 U/L — AB (ref 38–126)
ALT: 15 U/L (ref 0–44)
AST: 17 U/L (ref 15–41)
Anion gap: 7 (ref 5–15)
BILIRUBIN TOTAL: 0.2 mg/dL — AB (ref 0.3–1.2)
BUN: 22 mg/dL (ref 8–23)
CALCIUM: 8.8 mg/dL — AB (ref 8.9–10.3)
CO2: 23 mmol/L (ref 22–32)
CREATININE: 0.66 mg/dL (ref 0.61–1.24)
Chloride: 107 mmol/L (ref 98–111)
GFR calc Af Amer: >60 mL/min (ref >60)
GFR calc non Af Amer: >60 mL/min (ref >60)
GLUCOSE: 90 mg/dL (ref 70–99)
Potassium: 4.3 mmol/L (ref 3.5–5.1)
Sodium: 137 mmol/L (ref 135–145)
TOTAL PROTEIN: 6.3 g/dL — AB (ref 6.5–8.1)

## 2018-05-19 LAB — MAGNESIUM: Magnesium: 1.9 mg/dL (ref 1.7–2.4)

## 2018-05-19 LAB — PHOSPHORUS: Phosphorus: 3.5 mg/dL (ref 2.5–4.6)

## 2018-05-19 MED ORDER — ACETAMINOPHEN 500 MG PO TABS
1000.0000 mg | ORAL_TABLET | Freq: Four times a day (QID) | ORAL | Status: DC
  Administered 2018-05-19 – 2018-05-21 (×7): 1000 mg via ORAL
  Filled 2018-05-19 (×7): qty 2

## 2018-05-19 MED ORDER — TRAMADOL HCL 50 MG PO TABS
50.0000 mg | ORAL_TABLET | Freq: Four times a day (QID) | ORAL | Status: DC | PRN
  Administered 2018-05-27: 50 mg via ORAL
  Filled 2018-05-19 (×2): qty 1

## 2018-05-19 MED ORDER — TRAVASOL 10 % IV SOLN
INTRAVENOUS | Status: AC
  Administered 2018-05-19: 18:00:00 via INTRAVENOUS
  Filled 2018-05-19: qty 936

## 2018-05-19 MED ORDER — POLYETHYLENE GLYCOL 3350 17 G PO PACK
17.0000 g | PACK | Freq: Every day | ORAL | Status: DC
  Administered 2018-05-19 – 2018-05-23 (×3): 17 g via ORAL
  Filled 2018-05-19 (×6): qty 1

## 2018-05-19 MED ORDER — MAGNESIUM SULFATE IN D5W 1-5 GM/100ML-% IV SOLN
1.0000 g | Freq: Once | INTRAVENOUS | Status: AC
  Administered 2018-05-19: 1 g via INTRAVENOUS
  Filled 2018-05-19: qty 100

## 2018-05-19 NOTE — Consult Note (Signed)
Progress Note Date: 05/19/2018   Patient Name: Jeffrey Davila  DOB: Apr 10, 1951  MRN: [id number]  Age / Sex: 67 y.o., male  PCP: Boneta Lucks, MD Referring Physician: Jonetta Osgood, MD  Reason for Consultation: Establishing goals of care  HPI/Patient Profile: 67 y.o. male  with past medical history of schizophrenia, ? Dementia, HTN, BPH, prior trauma requiring abdominal surgery admitted on 04/29/2018 with sepsis and found to have SBO s/p surgery on 10/15.  Palliative consulted for goals of care.   Clinical Assessment and Goals of Care:  I met today with patient's sister at the bedside.  Jeffrey Davila slept throughout the encounter.  We discussed at length regarding his clinical course this admission as well as care plan moving forward.  Today, his sister reports being pleased with care he has been receiving and explanations given by care team but reports that this "sure wasn't the case a couple weeks ago."  We reviewed continued decline that he has experienced in his cognition and functional status as well as the fact that this will continue as he has chronic progressive illness.  She reports understanding this, but also believes that he is going to have recovery back to his prior baseline.  Physical Exam Physical Exam General: Alert, awake, in no acute distress. Sleeping on entering room but arouses easily. HEENT: No bruits, no goiter, no JVD Heart: Regular Lungs: Good air movement Abdomen: Soft, nontender, nondistended, positive bowel sounds.  Ext: No significant edema Skin: Warm and   Vital Signs: BP 107/64 (BP Location: Left Arm)   Pulse 91   Temp 98.7 F (37.1 C)   Resp 18   Ht '5\' 6"'  (1.676 m)   Wt 49.9 kg   SpO2 100%   BMI 17.75 kg/m  Pain Scale: 0-10 POSS *See Group Information*: S-Acceptable,Sleep, easy to arouse Pain Score: 0-No pain   SpO2: SpO2: 100 % O2 Device:SpO2: 100  % O2 Flow Rate: .O2 Flow Rate (L/min): 2 L/min  IO: Intake/output summary:   Intake/Output Summary (Last 24 hours) at 05/19/2018 2235 Last data filed at 05/19/2018 1815 Gross per 24 hour  Intake 582.31 ml  Output 1000 ml  Net -417.69 ml    LBM: Last BM Date: (none since surgery per pt's sister) Baseline Weight: Weight: 49.9 kg Most recent weight: Weight: 49.9 kg       Recommendations:  - Sister remains invested in plan for aggressive interventions.  She is hopeful that he can be transitioned to SNF for rehab at discharge. - Note concern in chart regarding patient's sisters ability to effectively make decisions on his behalf.  At this point, goals seem aligned with care team management recommendations, but this is going to continue to be an issue moving forward as he has chronic illness that will continue to worsen.  Would recommend continuing to pursue avenue to determine if she is, in fact, best surrogate to act on his behalf.  During my conversation today, she reported having no issues with care but reports having "rough time"  earlier this admission as she feels that she was not being listened to.  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  Time Total: 55 minutes  Greater than 50%  of this time was spent counseling and coordinating care related to the above assessment and plan.  Signed by: Micheline Rough, MD   Please contact Palliative Medicine Team phone at [phone] for questions and concerns.  For individual provider: See Shea Evans

## 2018-05-19 NOTE — [place] FL2 (Signed)
[place] MEDICAID FL2 LEVEL OF CARE SCREENING TOOL     IDENTIFICATION  Patient Name: Jeffrey Davila Birthdate: [birth year] Sex: male Admission Date (Current Location): 04/29/2018  [facility] and [place] Number:  Producer, television/film/video and Address:  [facility], [address] [place], [place] [address]      Provider Number: [id number]  Attending Physician Name and Address:  Maretta Bees, MD  Relative Name and Phone Number:  [name], sister, [phone]    Current Level of Care: Hospital Recommended Level of Care: Skilled Nursing Facility Prior Approval Number:    Date Approved/Denied:   PASRR Number:    Discharge Plan: SNF    Current Diagnoses: Patient Active Problem List   Diagnosis Date Noted  . SBO (small bowel obstruction) s/p adhesiolysis & SB resection 05/10/2018 05/12/2018  . Bipolar disorder (HCC)   . Schizophrenia (HCC)   . Hypertension   . Anxiety   . Anemia   . Protein-calorie malnutrition, severe 05/02/2018  . Sepsis (HCC) 04/29/2018    Orientation RESPIRATION BLADDER Height & Weight     Self, Time, Situation, Place  Normal Incontinent, External catheter Weight: 49.9 kg Height:  5\' 6"  (167.6 cm)  BEHAVIORAL SYMPTOMS/MOOD NEUROLOGICAL BOWEL NUTRITION STATUS  (Dementia, no behaviors)   Incontinent Diet(Please see DC Summary (no ng tube))  AMBULATORY STATUS COMMUNICATION OF NEEDS Skin   Extensive Assist Verbally Surgical wounds(Closed incision on abdomen)                       Personal Care Assistance Level of Assistance  Bathing, Feeding, Dressing Bathing Assistance: Maximum assistance Feeding assistance: Maximum assistance Dressing Assistance: Maximum assistance     Functional Limitations Info  Sight, Hearing, Speech Sight Info: Adequate Hearing Info: Adequate Speech Info: Adequate    SPECIAL CARE FACTORS FREQUENCY  PT (By licensed PT), OT (By licensed OT), Speech therapy     PT Frequency: 5x/week OT  Frequency: 3x/week     Speech Therapy Frequency: 3x/week      Contractures Contractures Info: Not present    Additional Factors Info  Code Status, Allergies Code Status Info: Full Allergies Info: NKA           Current Medications (05/19/2018):  This is the current hospital active medication list Current Facility-Administered Medications  Medication Dose Route Frequency Provider Last Rate Last Dose  . 0.9 %  sodium chloride infusion   Intravenous PRN Mattie Marlin L, PA 20 mL/hr at 05/14/18 1850 1,000 mL at 05/14/18 1850  . acetaminophen (TYLENOL) tablet 1,000 mg  1,000 mg Oral Q6H Barnetta Chapel, PA-C   1,000 mg at 05/19/18 1058  . albuterol (PROVENTIL) (2.5 MG/3ML) 0.083% nebulizer solution 2.5 mg  2.5 mg Nebulization Q2H PRN Focht, Jessica L, PA   2.5 mg at 05/13/18 0553  . alum & mag hydroxide-simeth (MAALOX/MYLANTA) 200-200-20 MG/5ML suspension 30 mL  30 mL Oral Q6H PRN Karie Soda, MD      . benztropine (COGENTIN) tablet 1 mg  1 mg Oral BID Focht, Jessica L, PA   1 mg at 05/19/18 1058  . bisacodyl (DULCOLAX) suppository 10 mg  10 mg Rectal Q12H PRN Karie Soda, MD      . Chlorhexidine Gluconate Cloth 2 % PADS 6 each  6 each Topical 2 times per day on Tue Focht, Jessica L, PA   6 each at 05/17/18 1001  . diphenhydrAMINE (BENADRYL) injection 25 mg  25 mg Intravenous Q6H PRN Mattie Marlin  L, PA   25 mg at 05/12/18 2348  . heparin injection 5,000 Units  5,000 Units Subcutaneous Q8H Focht, Jessica L, PA   5,000 Units at 05/19/18 0539  . lactated ringers infusion   Intravenous Continuous Jerre Simon, PA 10 mL/hr at 05/11/18 0617    . lip balm (CARMEX) ointment 1 application  1 application Topical BID Karie Soda, MD   1 application at 05/18/18 2227  . magic mouthwash  15 mL Oral QID PRN Karie Soda, MD   15 mL at 05/13/18 0929  . menthol-cetylpyridinium (CEPACOL) lozenge 3 mg  1 lozenge Oral PRN Karie Soda, MD      . metoprolol tartrate (LOPRESSOR) injection 5 mg  5  mg Intravenous Q4H PRN Focht, Jessica L, PA      . OLANZapine zydis (ZYPREXA) disintegrating tablet 10 mg  10 mg Oral BID Focht, Jessica L, PA   10 mg at 05/19/18 1059  . ondansetron (ZOFRAN) injection 4 mg  4 mg Intravenous Q6H PRN Focht, Jessica L, PA   4 mg at 05/02/18 2151  . phenol (CHLORASEPTIC) mouth spray 2 spray  2 spray Mouth/Throat PRN Karie Soda, MD      . polyethylene glycol (MIRALAX / GLYCOLAX) packet 17 g  17 g Oral Daily Maretta Bees, MD   17 g at 05/19/18 1059  . polyvinyl alcohol (LIQUIFILM TEARS) 1.4 % ophthalmic solution 1 drop  1 drop Both Eyes TID Focht, Jessica L, PA   1 drop at 05/19/18 1100  . sodium chloride flush (NS) 0.9 % injection 10-40 mL  10-40 mL Intracatheter PRN Focht, Jessica L, PA   10 mL at 05/17/18 0418  . TPN ADULT (ION)   Intravenous Continuous TPN Almon Hercules, RPH 65 mL/hr at 05/18/18 1733    . TPN ADULT (ION)   Intravenous Continuous TPN Masters, Darl Householder, RPH      . traMADol Janean Sark) tablet 50 mg  50 mg Oral Q6H PRN Barnetta Chapel, PA-C      . valproate (DEPACON) 250 mg in dextrose 5 % 50 mL IVPB  250 mg Intravenous Q6H Focht, Jessica L, PA 52.5 mL/hr at 05/19/18 0426 250 mg at 05/19/18 0426     Discharge Medications: Please see discharge summary for a list of discharge medications.  Relevant Imaging Results:  Relevant Lab Results:   Additional Information SSN: [id number] [place], [place]

## 2018-05-19 NOTE — Progress Notes (Signed)
[facility] alerted CSW that they have accepted the case.   [name] Mihira Tozzi LCSW [phone]

## 2018-05-19 NOTE — Clinical Social Work Note (Addendum)
Clinical Social Work Assessment  Patient Details  Name: Jeffrey Davila MRN: [id number] Date of Birth: 06-Jun-1951  Date of referral:  05/19/18               Reason for consult:  Facility Placement                Permission sought to share information with:  Facility Medical sales representative, Family Supports Permission granted to share information::  Yes, Verbal Permission Granted  Name::     Diana Eves::  SNFs  Relationship::  Sister  Contact Information:  [phone]  Housing/Transportation Living arrangements for the past 2 months:  Single Family Home Source of Information:  Siblings Patient Interpreter Needed:  None Criminal Activity/Legal Involvement Pertinent to Current Situation/Hospitalization:  No - Comment as needed Significant Relationships:  Siblings Lives with:  Siblings Do you feel safe going back to the place where you live?  No Need for family participation in patient care:  Yes (Comment)  Care giving concerns:  CSW received consult for possible SNF placement at time of discharge. CSW spoke with patient's sister (says she is POA but does not have paperwork) regarding PT recommendation of SNF placement at time of discharge. Patient's sister reported that she is currently unable to care for patient at their home given patient's current physical needs and fall risk. Patient's sister expressed understanding of PT recommendation and is agreeable to SNF placement at time of discharge. CSW to continue to follow and assist with discharge planning needs.   Social Worker assessment / plan:  CSW spoke with patient's sister concerning possibility of rehab at [facility] before returning home.  Employment status:  Retired Health and safety inspector:  Medicare PT Recommendations:  Skilled Nursing Facility Information / Referral to community resources:  Skilled Nursing Facility  Patient/Family's Response to care:  Patient's sister recognizes need for rehab before returning home and is  agreeable to a SNF in [place] or [place] as patient has been there before. She reports that she would be the one to transport him since ambulance does not transport over 50 miles without upfront payment. CSW provided SNF list. She reported that she would need to tour the facilities.   Patient/Family's Understanding of and Emotional Response to Diagnosis, Current Treatment, and Prognosis:  Patient/family is realistic regarding therapy needs and expressed being hopeful for SNF placement. Patient's sister expressed understanding of CSW role and discharge process as well as medical condition. No questions/concerns about plan or treatment.    Emotional Assessment Appearance:  Appears stated age Attitude/Demeanor/Rapport:  Unable to Assess Affect (typically observed):  Unable to Assess Orientation:  Oriented to Self, Oriented to Place, Oriented to  Time, Oriented to Situation Alcohol / Substance use:  Not Applicable Psych involvement (Current and /or in the community):  No (Comment)  Discharge Needs  Concerns to be addressed:  Care Coordination Readmission within the last 30 days:  No Current discharge risk:  None Barriers to Discharge:  Continued Medical Work up   [employer], LCSW 05/19/2018, 1:03 PM

## 2018-05-19 NOTE — Progress Notes (Signed)
PHARMACY - ADULT TOTAL PARENTERAL NUTRITION CONSULT NOTE   Pharmacy Consult for TPN Indication: SBO  Patient Measurements: Height: 5\' 6"  (167.6 cm) Weight: 110 lb (49.9 kg) IBW/kg (Calculated) : 63.8 TPN AdjBW (KG): 49.9 Body mass index is 17.75 kg/m.  Assessment:  10 YOM admitted on 10/4 with sepsis. He was found to have pneumonia and a possible SBO on CT on 10/5. Pt has a history of SBO s/p colectomy in 1993 (history (in the 1990s) of trauma causing small bowel injury requiring laparotomy). An NG tube was placed with improving small bowel dilatation but patient had 3-4 episodes of vomiting overnight on 10/7-10/8 and the NG tube had to be replaced due to incorrect placement. Pharmacy now consulted to manage TPN due to worsening SBO and concern for adhesions that require surgery.  Actual body is significantly below ideal body weight.  Of note, patient is extremely cachectic. Patient hx unreliable due to mental status.  GI: Pre-albumin down to 15.7. S/p ex-lap with LOA, SBR and anastomosis on 10/15. NGT not clamped as ordered 10/22, clamped 10/23 and allowing some clears, going slow per Surgery. +BS, no BM recorded. Xray w/ colonic "ileus".  Endo: No hx DM - CBGs controlled, SSI d/c'd 10/14 Lytes: Most WNL, Mg 1.9 (Goal K>/=4 and Mg>2/= with ileus) Renal: SCr stable WNL, UOP 1.4 cc/kg/hr, LR at [facility] Pulm: RA Cards: VSS. Lasix IV x 1 on 10/20 Hepatobil: LFTs / Tbili / TG wnl, Lipase elevated 10/4  TG 91 > 67 Neuro: Schizophrenia. Dementia. Benztropine (held since 10/20), Zyprexa bid, IV valproate ID: s/p abx for Klebsiella UTI and aspiration PNA. Afebrile, WBC WNL Heme: SQ heparin. Plts stable. Transfuse 10/20 > Hg low stable  TPN Access: double lumen PICC placed 10/10 TPN start date: 05/05/18  Nutritional Goals (per RD rec on 10/21): 1550-1750kCal, 85-100 gm protein, >1.5 L fluid per day  Current Nutrition:  TPN Clear liquids  Plan:  Continue TPN at 65 mL/hr, providing 94 g of  protein, 218 g of dextrose, and 55 g of lipids for a total of 1,663 kCals per day, meeting 100% of patient needs Electrolytes in TPN: Adjusted, Cl:Ac ratio 1:1 Add MVI, trace elements, Pepcid 40mg  to TPN Continue to monitor CBGs and adjust as needed Continue LR at 10 ml/hr per MD F/U TPN labs in AM, plans for advancing diet / weaning TPN   Baldemar Friday 05/19/2018 10:04 AM

## 2018-05-19 NOTE — Progress Notes (Signed)
Physical Therapy Treatment Patient Details Name: Jeffrey Davila MRN: [id number] DOB: Sep 14, 1950 Today's Date: 05/19/2018    History of Present Illness Pt is a 67 y/o male admitted secondary to AMS and Sepsis most likely secondary to UTI. CT negative for acute abnormality. Work up during hospitalizations shows, aspiration PNA, SBO, acute metabolic encephalopathy, anemia, AKI, severe malnutrition, deconditioning/debility, and hyponatremia.  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  PMH inlcudes BI, dementia, HTN, and schizophrenia.     PT Comments    Pt progressing well and able to ambulate short distance with close chair follow.  Pt required moderate assistance for gait as he presents with posterior lean.  Continue to recommend SNF placement to improve strength and function before returning home.      Follow Up Recommendations  SNF;Supervision/Assistance - 24 hour     Equipment Recommendations  Other (comment)(TBA further)    Recommendations for Other Services       Precautions / Restrictions Precautions Precautions: Fall Precaution Comments: NG tube Restrictions Weight Bearing Restrictions: No    Mobility  Bed Mobility Overal bed mobility: Needs Assistance Bed Mobility: Rolling;Supine to Sit     Supine to sit: Mod assist;+2 for safety/equipment     General bed mobility comments: Pt required mod assistance to advance LEs to edge of bed but required min assistance to elevate trunk into sitting.    Transfers Overall transfer level: Needs assistance Equipment used: Rolling walker (2 wheeled) Transfers: Sit to/from Stand Sit to Stand: Min assist;+2 physical assistance         General transfer comment: Min assistance to power up into standing.    Ambulation/Gait Ambulation/Gait assistance: +2 safety/equipment;Mod assist Gait Distance (Feet): 12 Feet Assistive device: Rolling walker (2 wheeled) Gait  Pattern/deviations: Step-to pattern;Shuffle;Trunk flexed     General Gait Details: Cues for upper trunk control assistance to correct path of RW.  Pt flexed forward and required encouragement to progress distance out of room into hall.  Close chair follow for safety.     Stairs             Wheelchair Mobility    Modified Rankin (Stroke Patients Only)       Balance Overall balance assessment: Needs assistance   Sitting balance-Leahy Scale: Poor       Standing balance-Leahy Scale: Poor                              Cognition Arousal/Alertness: Awake/alert Behavior During Therapy: Flat affect Overall Cognitive Status: History of cognitive impairments - at baseline                                 General Comments: 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      Exercises      General Comments        Pertinent Vitals/Pain Pain Assessment: Faces Pain Score: 0-No pain    Home Living                      Prior Function            PT Goals (current goals can now be found in the care plan section) Acute Rehab PT Goals Patient Stated Goal: Sister "to go to rehab and then home." Potential to Achieve Goals: Fair Progress towards PT  goals: Progressing toward goals    Frequency    Min 2X/week      PT Plan Current plan remains appropriate    Co-evaluation              AM-PAC PT "6 Clicks" Daily Activity  Outcome Measure  Difficulty turning over in bed (including adjusting bedclothes, sheets and blankets)?: Unable Difficulty moving from lying on back to sitting on the side of the bed? : Unable Difficulty sitting down on and standing up from a chair with arms (e.g., wheelchair, bedside commode, etc,.)?: Unable Help needed moving to and from a bed to chair (including a wheelchair)?: A Little Help needed walking in hospital room?: A Little Help needed climbing 3-5 steps with a railing? : A Little 6 Click  Score: 12    End of Session Equipment Utilized During Treatment: Gait belt Activity Tolerance: Patient tolerated treatment well;Treatment limited secondary to agitation Patient left: with call bell/phone within reach;with family/visitor present;in bed;with bed alarm set Nurse Communication: Mobility status PT Visit Diagnosis: Unsteadiness on feet (R26.81);Muscle weakness (generalized) (M62.81);Difficulty in walking, not elsewhere classified (R26.2)     Time: 8469-6295 PT Time Calculation (min) (ACUTE ONLY): 19 min  Charges:  $Gait Training: 8-22 mins                     Joycelyn Rua, PTA Acute Rehabilitation Services Pager [phone] Office [phone]     Jourdin Connors [name] 05/19/2018, 5:11 PM

## 2018-05-19 NOTE — Care Management Note (Signed)
Case Management Note Previous CM note completed by Elliot Cousin, RN 05/18/2018, 4:20 PM  Patient Details  Name: Jeffrey Davila MRN: [id number] Date of Birth: Aug 23, 1950  Subjective/Objective:  Small bowel obstruction, s/p Laparotomy, Asp PNA, Sepsis s/t UTI                   Action/Plan: LTAC referral. Contacted pt's sister, [name] for LTAC referral. Offered choice and she decided with [facility]. Contacted [facility] rep. Carina with new referral.   Expected Discharge Date:                 Expected Discharge Plan:  Skilled facility  In-House Referral:  Clinical Social Work  Discharge planning Services  CM Consult  Post Acute Care Choice:  NA Choice offered to:  NA  DME Arranged:  N/A DME Agency:  NA  HH Arranged:  NA HH Agency:  NA  Status of Service:  In process, will continue to follow  If discussed at Long Length of Stay Meetings, dates discussed:    Discharge Disposition:   Additional Comments:  05/19/18- 1130- [name] RN, CM- f/u done with [name] from [facility]- they have declined to offer bed to pt as well as [facility]- pt will need to go to SNF- CM and CSW- [name] spoke with sister [name] at the bedside to explain there are not bed offers for [facility] and that she would need to move forward with SNF bed search, list provided to her by CSW and bed search for SNF will be started by CSW.  Noted plan per MD note to d/c NGT and start clear liquid diet.   [name], RN 05/19/2018, 12:43 PM 5W cross coverage [phone]

## 2018-05-19 NOTE — Progress Notes (Signed)
Patient ID: Jeffrey Davila, male   DOB: May 05, 1951, 67 y.o.   MRN: [id number]    9 Days Post-Op  Subjective: Patient still doesn't vocalize to me.  NGT has been clamped for 24 hrs.  Appears to have not had nausea.  Taking sips of clears from floor.  Objective: Vital signs in last 24 hours: Temp:  [98.1 F (36.7 C)-99.3 F (37.4 C)] 99.3 F (37.4 C) (10/24 0415) Pulse Rate:  [77-93] 93 (10/24 0415) Resp:  [15-20] 18 (10/24 0415) BP: (98-104)/(61-76) 99/69 (10/24 0415) SpO2:  [100 %] 100 % (10/24 0415) Last BM Date: (none since surgery per pt's sister)  Intake/Output from previous day: 10/23 0701 - 10/24 0700 In: 1172.5 [I.V.:1125.5; IV Piggyback:47.1] Out: 850 [Urine:850] Intake/Output this shift: No intake/output data recorded.  PE: Abd: soft, seems NT, +BS, incision is c/d/i with staples present  Lab Results:  No results for input(s): WBC, HGB, HCT, PLT in the last 72 hours. BMET Recent Labs    05/18/18 0441 05/19/18 0408  NA 137 137  K 4.2 4.3  CL 107 107  CO2 23 23  GLUCOSE 104* 90  BUN 23 22  CREATININE 0.61 0.66  CALCIUM 8.6* 8.8*   PT/INR No results for input(s): LABPROT, INR in the last 72 hours. CMP     Component Value Date/Time   NA 137 05/19/2018 0408   K 4.3 05/19/2018 0408   CL 107 05/19/2018 0408   CO2 23 05/19/2018 0408   GLUCOSE 90 05/19/2018 0408   BUN 22 05/19/2018 0408   CREATININE 0.66 05/19/2018 0408   CALCIUM 8.8 (L) 05/19/2018 0408   PROT 6.3 (L) 05/19/2018 0408   ALBUMIN 2.3 (L) 05/19/2018 0408   AST 17 05/19/2018 0408   ALT 15 05/19/2018 0408   ALKPHOS 148 (H) 05/19/2018 0408   BILITOT 0.2 (L) 05/19/2018 0408   GFRNONAA >60 05/19/2018 0408   GFRAA >60 05/19/2018 0408   Lipase     Component Value Date/Time   LIPASE 204 (H) 04/29/2018 0904       Studies/Results: No results found.  Anti-infectives: Anti-infectives (From admission, onward)   Start     Dose/Rate Route Frequency Ordered Stop   05/10/18 0941   ceFAZolin (ANCEF) 2-4 GM/100ML-% IVPB    Note to Pharmacy:  [name]   : cabinet override      05/10/18 0941 05/10/18 2144   05/01/18 1215  metroNIDAZOLE (FLAGYL) IVPB 500 mg  Status:  Discontinued     500 mg 100 mL/hr over 60 Minutes Intravenous Every 8 hours 05/01/18 1201 05/06/18 1153   04/30/18 1100  cefTRIAXone (ROCEPHIN) 2 g in sodium chloride 0.9 % 100 mL IVPB  Status:  Discontinued     2 g 200 mL/hr over 30 Minutes Intravenous Every 24 hours 04/29/18 1416 05/06/18 1153   04/29/18 1430  cefTRIAXone (ROCEPHIN) 1 g in sodium chloride 0.9 % 100 mL IVPB     1 g 200 mL/hr over 30 Minutes Intravenous Every 24 hours 04/29/18 1416 04/30/18 1429   04/29/18 1130  cefTRIAXone (ROCEPHIN) 1 g in sodium chloride 0.9 % 100 mL IVPB     1 g 200 mL/hr over 30 Minutes Intravenous  Once 04/29/18 1118 04/29/18 1251       Assessment/Plan Hx of prior lung surgery Acute metabolic encephalopathy Anemia- transfused 10/14 - 6.9/21 today Dr. Thedore Mins following AKI- resolved Hypertension- controlled Hypokalemia/Hyponatremia- resolved Hx of schizophrenia- cogentin/Zyprexa - very difficult to get answers from him Deconditioning - bedbound Malnutrition -  severe- TNA Hx of partial right lobectomy 2018 Prior TBI  Small bowel obstruction with multiple adhesions S/PDiagnostic laparoscopy, lysis of adhesions x65 minutes, small bowel resection and anastomosis, 05/10/2018, Dr.Armando RamirezPOD#[email or web address] -DC NGT and give clear liquids.  Sister at the bedside and discussed with her.  She liked this plan and was agreeable and felt it would be good for the patient. -mobilize as able. Mostly bed bound, but would like to get him up to a chair if possible. -cont TNA for nutritional support at this time, once tolerating more to eat, will start to wean -scheduled tylenol, tramadol prn for pain   ID -rocephin 10/4>>10/11, flagyl 10/6>>10/11 FEN -IVF,clear liquids, TNA VTE -SCDs, SQ heparin Foley  -condom cath   LOS: 20 days    Letha Cape , [facility] 05/19/2018, 9:07 AM Pager: [phone]

## 2018-05-19 NOTE — Progress Notes (Signed)
Pasrr: [id number] E expires 06/18/18.  [name] Nabeel Gladson LCSW [phone]

## 2018-05-19 NOTE — Progress Notes (Signed)
PROGRESS NOTE        PATIENT DETAILS Name: Jeffrey Davila Age: 67 y.o. Sex: male Date of Birth: February 16, 1951 Admit Date: 04/29/2018 Admitting Physician Jeffrey Arms, MD PCP:Uba, Reuel Boom, MD  Brief Narrative: Patient is a 67 y.o. male with history of schizophrenia/?  Cognitive dysfunction (dementia), hypertension, BPH, remote history (in the 1990s) of trauma causing small bowel injury requiring laparotomy-mostly wheelchair-bound-presented to the hospital with approximately 1 week history of intermittent vomiting, confusion for a few days prior to this hospital admission-presented with sepsis secondary to UTI, vomiting and significant constipation.  Patient was started on IV antibiotics and admitted to the hospital service,initial imaging was suggestive of constipation causing gastric distention-NG tube was placed, however upon further evaluation with a CT scan he was found  to have a small bowel obstruction.  General surgery was consulted, even with n.p.o. status/NG tube decompression-he continues to have SBO-patient's sister has refused to sign consent for surgery and wanted patient to receive several days of TNA before she consented to surgery.  Subsequently underwent laparotomy with lysis of adhesions on 10/15, postoperative course complicated by ileus and low-grade fever.  See below for further details.  Subjective: No BM-but passing flatus-tolerated NG tube clamping for 24 hours-starting clear liquids today per general surgery.  Lying comfortably in bed.  Sister at bedside.  Assessment/Plan: Small bowel obstruction: Underwent laparotomy with lysis of adhesions on 10/15-postoperative course complicated by ileus.  Successfully tolerated NG tube clamping for 24 hours-no vomiting-General surgery recommending to remove NG tube and start clear liquids.  Abdominal exam continues to be benign-bowel sounds present.    Low-grade fever on 10/19: No foci of infection  apparent-could have been from atelectasis-not on any antimicrobial therapy.  Blood culture on 10/20 neg so far, chest x-ray on 10/20 neg for PNA.  Remains afebrile since then.  Follow.  Sepsis secondary to complicated UTI: Sepsis pathophysiology has resolved, urine culture positive for Klebsiella-has completed a course of IV Rocephin.  Blood cultures remain negative.    Aspiration pneumonia: This occurred in the early part of his hospitalization-felt to be secondary to o SBO/vomiting with underlying frailty.  Has completed a course of antimicrobial therapy.  Continue to mobilize as much as possible.    Acute metabolic encephalopathy: Improved and back to his usual baseline.  Encephalopathy was present on initial presentation-this was felt secondary to UTI, and developing SBO.Marland Kitchen  Anemia: Suspect anemia secondary to acute illness, and IV fluid dilution.  Has been transfused a total of 3 units so far, last transfusion on 10/20.  Follow CBC periodically.  No indication of any overt blood loss at this time.   AKI: Hemodynamically mediated, resolved.  Hypernatremia: Secondary to vomiting/dehydration-resolved.  Hyperkalemia: Resolved  Schizophrenia: Appears stable-continue IV Depakote and olanzapine.  NG tube is been clamped-restart Cogentin.   Hypertension: Blood pressure appears stable-continue to hold antihypertensives.   Severe malnutrition: Continue TNA-we will start low-dose nutritional supplements when diet is more stable.    Deconditioning/debility: Suspect has significant amount of debility at baseline-he is very cachectic-deconditioning/debility has worsened due to acute illness/SBO.  PT following.  Other issues:  this MD is familiar with this patient-I took care of during the initial part of his hospital stay.  Patient's sister initially wanted guarantees with surgery, wanted transfer to [facility] (refused by [facility]).  See my prior notes.  I have  reviewed notes by Dr. Marena Chancy issues with  patient's sister refusing NG tube placement, wanting transfer to [facility] ([facility] [place] [facility] [place]).  Per nursing staff-she has on occasion been very disruptive to their workflow and in the care of the patient.  I completely agree with Dr. Marena Chancy concerns whether patient's sister is the best person to be making decisions for this patient-given numerous documentations by nursing staff and by prior MDs.  Social work, risk Insurance account manager, Camera operator and palliative care following.  I will continue to engage/educate and discuss patient's care with the patient's sister when she arrives at the hospital.  DVT Prophylaxis: Prophylactic Heparin   Code Status: Full code   Family Communication: Spoke with sister at bedside-she was very cooperative and understanding this morning.    Disposition Plan: Remain inpatient- to SNF once diet advanced further.  Antimicrobial agents: Anti-infectives (From admission, onward)   Start     Dose/Rate Route Frequency Ordered Stop   05/10/18 0941  ceFAZolin (ANCEF) 2-4 GM/100ML-% IVPB    Note to Pharmacy:  [name]   : cabinet override      05/10/18 0941 05/10/18 2144   05/01/18 1215  metroNIDAZOLE (FLAGYL) IVPB 500 mg  Status:  Discontinued     500 mg 100 mL/hr over 60 Minutes Intravenous Every 8 hours 05/01/18 1201 05/06/18 1153   04/30/18 1100  cefTRIAXone (ROCEPHIN) 2 g in sodium chloride 0.9 % 100 mL IVPB  Status:  Discontinued     2 g 200 mL/hr over 30 Minutes Intravenous Every 24 hours 04/29/18 1416 05/06/18 1153   04/29/18 1430  cefTRIAXone (ROCEPHIN) 1 g in sodium chloride 0.9 % 100 mL IVPB     1 g 200 mL/hr over 30 Minutes Intravenous Every 24 hours 04/29/18 1416 04/30/18 1429   04/29/18 1130  cefTRIAXone (ROCEPHIN) 1 g in sodium chloride 0.9 % 100 mL IVPB     1 g 200 mL/hr over 30 Minutes Intravenous  Once 04/29/18 1118 04/29/18 1251      Procedures: None  CONSULTS:  None  Time spent: 15 minutes  minutes  MEDICATIONS: Scheduled Meds: . acetaminophen  1,000 mg Oral Q6H  . benztropine  1 mg Oral BID  . Chlorhexidine Gluconate Cloth  6 each Topical 2 times per day on Tue  . heparin  5,000 Units Subcutaneous Q8H  . lip balm  1 application Topical BID  . OLANZapine zydis  10 mg Oral BID  . polyethylene glycol  17 g Oral Daily  . polyvinyl alcohol  1 drop Both Eyes TID   Continuous Infusions: . sodium chloride 1,000 mL (05/14/18 1850)  . lactated ringers 10 mL/hr at 05/11/18 0617  . TPN ADULT (ION) 65 mL/hr at 05/18/18 1733  . TPN ADULT (ION)    . valproate sodium 250 mg (05/19/18 0426)   PRN Meds:.sodium chloride, albuterol, alum & mag hydroxide-simeth, bisacodyl, diphenhydrAMINE, magic mouthwash, menthol-cetylpyridinium, metoprolol tartrate, ondansetron (ZOFRAN) IV, phenol, sodium chloride flush, traMADol   PHYSICAL EXAM: Vital signs: Vitals:   05/18/18 2329 05/19/18 0027 05/19/18 0415 05/19/18 1125  BP: 101/71  99/69 107/77  Pulse: 82  93 (!) 108  Resp: 18  18 18   Temp:  99 F (37.2 C) 99.3 F (37.4 C) 99.2 F (37.3 C)  TempSrc:  Oral Oral Oral  SpO2: 100%  100% 100%  Weight:      Height:       Filed Weights   04/29/18 0842  Weight: 49.9 kg   Body  mass index is 17.75 kg/m.   General appearance:Awake, alert, not in any distress.  Resp:Good air entry bilaterally,no rales or rhonchi CVS: S1 S2 regular, no murmurs.  GI: Bowel sounds+, Non tender-laparotomy scar present. Extremities: B/L Lower Ext shows no edema, both legs are warm to touch Neurology:  Non focal but appears to have generalized weakness.   I have personally reviewed following labs and imaging studies  LABORATORY DATA: CBC: Recent Labs  Lab 05/13/18 0407 05/14/18 0403 05/15/18 0419 05/15/18 1430 05/16/18 0401  WBC 14.7* 10.4 8.0  --  8.3  NEUTROABS  --   --   --   --  5.6  HGB 7.4* 7.0* 6.9* 9.1* 8.6*  HCT 24.0* 22.4* 21.8* 28.7* 27.5*  MCV 95.2 95.3 95.2  --  90.8  PLT 587* 576*  586*  --  608*    Basic Metabolic Panel: Recent Labs  Lab 05/15/18 0419 05/16/18 0401 05/17/18 0412 05/18/18 0441 05/19/18 0408  NA 138 138 136 137 137  K 3.8 4.0 4.1 4.2 4.3  CL 109 110 107 107 107  CO2 23 24 23 23 23   GLUCOSE 109* 99 100* 104* 90  BUN 21 21 22 23 22   CREATININE 0.56* 0.62 0.61 0.61 0.66  CALCIUM 8.4* 8.5* 8.7* 8.6* 8.8*  MG 1.9 2.0 2.1 2.0 1.9  PHOS  --  3.6 3.5 3.9 3.5    GFR: Estimated Creatinine Clearance: 64.1 mL/min (by C-G formula based on SCr of 0.66 mg/dL).  Liver Function Tests: Recent Labs  Lab 05/15/18 0419 05/16/18 0401 05/17/18 0412 05/18/18 0441 05/19/18 0408  AST 19 15 14* 12* 17  ALT 17 17 18 16 15   ALKPHOS 119 142* 168* 149* 148*  BILITOT 0.2* 0.4 0.4 0.4 0.2*  PROT 5.8* 6.1* 6.1* 6.2* 6.3*  ALBUMIN 2.0* 2.1* 2.1* 2.2* 2.3*   No results for input(s): LIPASE, AMYLASE in the last 168 hours. No results for input(s): AMMONIA in the last 168 hours.  Coagulation Profile: Recent Labs  Lab 05/13/18 0811  INR 1.39    Cardiac Enzymes: No results for input(s): CKTOTAL, CKMB, CKMBINDEX, TROPONINI in the last 168 hours.  BNP (last 3 results) No results for input(s): PROBNP in the last 8760 hours.  HbA1C: No results for input(s): HGBA1C in the last 72 hours.  CBG: Recent Labs  Lab 05/16/18 1757 05/17/18 1203 05/18/18 0815 05/19/18 0004 05/19/18 0817  GLUCAP 96 96 107* 98 106*    Lipid Profile: No results for input(s): CHOL, HDL, LDLCALC, TRIG, CHOLHDL, LDLDIRECT in the last 72 hours.  Thyroid Function Tests: No results for input(s): TSH, T4TOTAL, FREET4, T3FREE, THYROIDAB in the last 72 hours.  Anemia Panel: No results for input(s): VITAMINB12, FOLATE, FERRITIN, TIBC, IRON, RETICCTPCT in the last 72 hours.  Urine analysis:    Component Value Date/Time   COLORURINE YELLOW 05/15/2018 1109   APPEARANCEUR CLEAR 05/15/2018 1109   LABSPEC 1.013 05/15/2018 1109   PHURINE 7.0 05/15/2018 1109   GLUCOSEU NEGATIVE  05/15/2018 1109   HGBUR SMALL (A) 05/15/2018 1109   BILIRUBINUR NEGATIVE 05/15/2018 1109   KETONESUR NEGATIVE 05/15/2018 1109   PROTEINUR NEGATIVE 05/15/2018 1109   UROBILINOGEN 1.0 06/08/2010 1523   NITRITE NEGATIVE 05/15/2018 1109   LEUKOCYTESUR TRACE (A) 05/15/2018 1109    Sepsis Labs: Lactic Acid, Venous    Component Value Date/Time   LATICACIDVEN 1.07 04/29/2018 1334    MICROBIOLOGY: Recent Results (from the past 240 hour(s))  Surgical PCR screen     Status: None  Collection Time: 05/09/18 11:29 PM  Result Value Ref Range Status   MRSA, PCR NEGATIVE NEGATIVE Final   Staphylococcus aureus NEGATIVE NEGATIVE Final    Comment: (NOTE) The Xpert SA Assay (FDA approved for NASAL specimens in patients 20 years of age and older), is one component of a comprehensive surveillance program. It is not intended to diagnose infection nor to guide or monitor treatment. Performed at [facility], [address], [place], [place] [address]   Culture, blood (routine x 2)     Status: None (Preliminary result)   Collection Time: 05/15/18  3:15 PM  Result Value Ref Range Status   Specimen Description BLOOD SITE NOT SPECIFIED  Final   Special Requests   Final    BOTTLES DRAWN AEROBIC ONLY Blood Culture results may not be optimal due to an inadequate volume of blood received in culture bottles   Culture   Final    NO GROWTH 4 DAYS Performed at [facility], [address], [place], [place] [address]    Report Status PENDING  Incomplete  Culture, blood (routine x 2)     Status: None (Preliminary result)   Collection Time: 05/15/18  3:22 PM  Result Value Ref Range Status   Specimen Description BLOOD SITE NOT SPECIFIED  Final   Special Requests   Final    BOTTLES DRAWN AEROBIC ONLY Blood Culture results may not be optimal due to an inadequate volume of blood received in culture bottles   Culture   Final    NO GROWTH 4 DAYS Performed at [facility], [address], [place], [place] [address]    Report Status PENDING  Incomplete    RADIOLOGY STUDIES/RESULTS: Ct Abdomen Pelvis Wo Contrast  Result Date: 04/30/2018 CLINICAL DATA:  Nausea, bilious vomiting EXAM: CT ABDOMEN AND PELVIS WITHOUT CONTRAST TECHNIQUE: Multidetector CT imaging of the abdomen and pelvis was performed following the standard protocol without IV contrast. Sagittal and coronal MPR images reconstructed from axial data set. Oral contrast was not administered. COMPARISON:  None FINDINGS: Lower chest: Emphysematous changes with BILATERAL lower lobe infiltrates consistent with either pneumonia or aspiration. Hepatobiliary: Liver unremarkable. Gallbladder not well visualized due to streak artifacts, grossly unremarkable. Pancreas: Normal appearance Spleen: Normal appearance Adrenals/Urinary Tract: Question adrenal thickening bilaterally. No obvious renal mass or hydronephrosis. Ureters not visualized. Foley catheter decompresses urinary bladder. Stomach/Bowel: Nasogastric tube in stomach. Low-attenuation fluid distends stomach and multiple proximal small bowel loops, with small bowel loops up to 4.1 cm diameter. Distal small bowel loops and colon are decompressed. Scattered stool throughout colon. Findings likely represent mid small bowel obstruction. Appendix not visualized. Suspected rectal wall thickening. Vascular/Lymphatic: Atherosclerotic calcification aorta. Aorta normal caliber. Reproductive: Minimal prostatic enlargement and scattered prostatic calcifications. Other: No definite free air or free fluid. Nonspecific stranding of presacral fat. Musculoskeletal: No acute osseous findings. IMPRESSION: Dilated proximal and decompressed distal small bowel loops compatible with small bowel obstruction, likely in the mid small bowel. Due to lack of fat planes, streak artifacts, and lack of contrast, unable to localize site of obstruction. No evidence of perforation/free air. Question rectal wall thickening,  recommend correlation with proctoscopy. Emphysematous changes with bibasilar pulmonary infiltrates either representing pneumonia or aspiration. Electronically Signed   By: Ulyses Southward M.D.   On: 04/30/2018 19:38   Dg Abd 1 View  Result Date: 05/16/2018 CLINICAL DATA:  Feeding tube advanced today. EXAM: ABDOMEN - 1 VIEW COMPARISON:  Plain films of the abdomen dated 05/15/2018  and 05/14/2018. FINDINGS: Enteric tube is coiled in the stomach with tip at the level of the gastric fundus/cardia. Distended gas-filled loops of large and small bowel are again appreciated throughout the abdomen and upper pelvis. IMPRESSION: 1. Feeding tube is coiled in the stomach with tip at the level of the gastric fundus/cardia. 2. Persistent gaseous distention of both large and small bowel loops throughout the abdomen and pelvis, compatible with previous reports of obstruction or ileus. Electronically Signed   By: Bary Richard M.D.   On: 05/16/2018 20:26   Dg Abd 1 View  Result Date: 05/03/2018 CLINICAL DATA:  Small bowel obstruction EXAM: ABDOMEN - 1 VIEW COMPARISON:  05/02/2018 FINDINGS: NG tube is in the stomach. Gaseous distention of the stomach and left abdominal small bowel loops. Gas and stool within nondistended colon. No free air or organomegaly. IMPRESSION: Gaseous distention of stomach and left abdominal small bowel loops likely reflecting small bowel obstruction. NG tube is in the stomach. Electronically Signed   By: Charlett Nose M.D.   On: 05/03/2018 10:05   Dg Abd 1 View  Result Date: 05/02/2018 CLINICAL DATA:  Small bowel obstruction EXAM: ABDOMEN - 1 VIEW COMPARISON:  May 01, 2018 abdominal radiograph and CT abdomen and pelvis April 30, 2018 FINDINGS: Nasogastric tube no longer present. There is slightly less bowel dilatation in the left upper quadrant compared to 1 day prior. There is moderate air in the stomach. No bowel dilatation elsewhere. No air-fluid levels. No free air. There is moderate stool in  the colon. There is atelectatic change in the right lung base. IMPRESSION: Nasogastric tube no longer appreciable. Less small bowel dilatation in the left upper quadrant. Question resolving bowel obstruction. No free air evident. Electronically Signed   By: Bretta Bang III M.D.   On: 05/02/2018 14:20   Ct Head Wo Contrast  Result Date: 04/29/2018 CLINICAL DATA:  Altered level of consciousness increased over last 2 days, 2 episodes of nausea and vomiting, history of prior brain injury, dementia, former smoker, hypertension EXAM: CT HEAD WITHOUT CONTRAST TECHNIQUE: Contiguous axial images were obtained from the base of the skull through the vertex without intravenous contrast. COMPARISON:  06/08/2010 FINDINGS: Brain: Generalized atrophy. Normal ventricular morphology. No midline shift or mass effect. Small vessel chronic ischemic changes of deep cerebral white matter. 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 No intracranial hemorrhage, mass lesion, evidence of acute infarction, or extra-axial fluid collection. Vascular: Minimal atherosclerotic calcification of internal carotid arteries at skull base Skull: Intact Sinuses/Orbits: Clear Other: N/A IMPRESSION: Atrophy with small vessel chronic ischemic changes of deep cerebral white matter. 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 No acute intracranial abnormalities. Electronically Signed   By: Ulyses Southward M.D.   On: 04/29/2018 09:58   Dg Chest Port 1 View  Result Date: 05/15/2018 CLINICAL DATA:  Shortness of breath. Patient 5 days postop bowel resection with lysis of adhesions. EXAM: PORTABLE CHEST 1 VIEW COMPARISON:  05/11/2018 FINDINGS: Right-sided PICC line with tip over the SVC. Lungs are adequately inflated with subtle left retrocardiac opacification which may be due to vascular crowding or atelectasis and less likely infection. Remainder of the lungs are clear. Cardiomediastinal silhouette is within normal. Persistent evidence of  free peritoneal air compatible with patient's recent surgery. IMPRESSION: Mild left base opacification likely atelectasis or vascular crowding and less likely infection. Persistent free peritoneal air compatible recent postop state. Right-sided PICC line with tip over the SVC. Electronically Signed   By: Reuel Davila  Micheline Maze M.D.   On: 05/15/2018 09:38   Dg Chest Port 1 View  Result Date: 05/11/2018 CLINICAL DATA:  Shortness of breath, hypertension, pneumonia, dementia, former smoker EXAM: PORTABLE CHEST 1 VIEW COMPARISON:  Portable exam 0945 hours compared to 05/09/2018 FINDINGS: Nasogastric tube extends into stomach. RIGHT arm PICC line tip projects over SVC. Normal heart size and pulmonary vascularity. Elongation of thoracic aorta. Lungs appear emphysematous but clear. No pleural effusion or pneumothorax. Bones demineralized. Free air identified under the hemidiaphragms bilaterally; EHR indicates patient underwent a laparoscopic procedure on 05/10/2018, small-bowel resection and lysis of adhesions for small-bowel obstruction prior operative note. IMPRESSION: COPD changes without infiltrate. Free air consistent with abdominal surgery 1 day ago. Electronically Signed   By: Ulyses Southward M.D.   On: 05/11/2018 10:21   Dg Chest Port 1 View  Result Date: 05/09/2018 CLINICAL DATA:  Cough EXAM: PORTABLE CHEST 1 VIEW COMPARISON:  05/04/2018 FINDINGS: NG tube is stable. Right upper extremity PICC placed. Tip is at the cavoatrial junction. Lungs remain hyperaerated and clear. Small right pleural effusion is stable. No pneumothorax. Aorta remains somewhat prominent due to tortuosity and rotation of the thorax. IMPRESSION: New right upper extremity PICC with its tip at the cavoatrial junction. No active cardiopulmonary disease. Electronically Signed   By: Jolaine Click M.D.   On: 05/09/2018 07:37   Dg Chest Port 1 View  Result Date: 04/29/2018 CLINICAL DATA:  Altered mental status EXAM: PORTABLE CHEST 1 VIEW COMPARISON:   April 12, 2018 FINDINGS: There is mild scarring in the lateral right base and in the medial right lower lung region. There is no edema or consolidation. The heart size and pulmonary vascularity are normal. No adenopathy. There is a recent appearing fracture of the posterior right seventh rib with alignment essentially anatomic. An older fracture of the posterior right eighth rib is noted. No pneumothorax. IMPRESSION: Recent appearing fracture posterior right seventh rib. Older fracture posterior right eighth rib noted. Areas of mild scarring noted on the right. No edema or consolidation. Stable cardiac silhouette. Electronically Signed   By: Bretta Bang III M.D.   On: 04/29/2018 08:46   Dg Chest Port 1v Same Day  Result Date: 05/01/2018 CLINICAL DATA:  history of schizophrenia, hypertension, BPH-mostly wheelchair-bound-presented to the hospital with approximately 1 week history of intermittent vomiting, confusion for a few days prior to this hospital admission-presented with sepsis secondary to UTI, and significant constipation. C/o SOB EXAM: PORTABLE CHEST - 1 VIEW SAME DAY COMPARISON:  04/29/2018 FINDINGS: Lungs are hyperinflated. Subsegmental atelectasis or early infiltrate at the left lung base, new since previous. Right lung clear. Heart size normal.  Tortuous ectatic thoracic aorta. No effusion. No pneumothorax. Right anterolateral fourth rib fracture, age indeterminate. Nasogastric tube extends to the stomach. IMPRESSION: 1. New patchy infiltrate or subsegmental atelectasis at the left lung base. 2. Right fourth rib fracture without pneumothorax. 3. Nasogastric tube placement to the stomach. Electronically Signed   By: Corlis Leak M.D.   On: 05/01/2018 08:24   Dg Abd 2 Views  Result Date: 05/05/2018 CLINICAL DATA:  Vomiting.  History of right lumpectomy. EXAM: ABDOMEN - 2 VIEW COMPARISON:  05/04/2018 FINDINGS: Improve dilation of small bowel loops with residual borderline gas-filled  distended small bowel loops in the left upper quadrant of the abdomen. Normal colonic bowel gas pattern. No evidence of free intra-abdominal gas. Normal cardiac silhouette. Tortuosity of the thoracic aorta. Right pleural reflection versus small pleural effusion. No lobar airspace consolidation. Enteric catheter tip within  the expected location the gastric body, side hole at the GE junction level. Right PICC line terminates in the expected location of the cavoatrial junction. IMPRESSION: Improved appearance of small-bowel obstruction. Enteric catheter with side hole at the level of the GE junction. Advancement may be considered. Possible small right pleural effusion. Electronically Signed   By: Ted Mcalpine M.D.   On: 05/05/2018 12:51   Dg Abd Acute W/chest  Result Date: 05/04/2018 CLINICAL DATA:  Follow-up small bowel obstruction. EXAM: DG ABDOMEN ACUTE W/ 1V CHEST COMPARISON:  Abdominal x-rays 05/04/1999 19 dating back to 04/29/2018. CT abdomen and pelvis 04/30/2018. Chest x-rays 05/01/2018, 04/12/2018. FINDINGS: Nasogastric tube tip in the fundus of the stomach. Persistent marked gaseous distension of the jejunum in the LEFT UPPER QUADRANT, slowly progressive over the past 3 days, with air-fluid levels on the LATERAL decubitus image. No evidence of free intraperitoneal air. Normal caliber colon with moderate stool burden. Calcified uterine fibroid in the pelvic midline. Cardiac silhouette normal in size, unchanged. Thoracic aorta tortuous and atherosclerotic. Hilar and mediastinal contours otherwise unremarkable. Interval improvement in aeration in the LEFT lung base, with only minimal patchy opacities persisting. Lungs otherwise clear. Emphysematous changes in both lungs with scarring in the RIGHT mid lung, unchanged. Pleuroparenchymal scarring at the RIGHT lung base which accounts for the blunting of the costophrenic angle, unchanged dating back to the original 04/12/2018 examination. IMPRESSION: 1.  Persistent partial small bowel obstruction which has slowly worsened over the past 3 days. 2. No evidence of free intraperitoneal air. 3. Improved aeration in the LEFT LOWER LOBE, with only mild atelectasis and/or pneumonia persisting. Electronically Signed   By: Hulan Saas M.D.   On: 05/04/2018 09:28   Dg Abd Portable 1v  Result Date: 05/17/2018 CLINICAL DATA:  Small-bowel obstruction. EXAM: PORTABLE ABDOMEN - 1 VIEW COMPARISON:  05/16/2018.  05/15/2018.  CT 04/30/2018. FINDINGS: Surgical staples noted over the abdomen. NG tube noted with its tip in the stomach. Distended loops of small and large bowel are again noted. Similar findings noted on prior exam. No free air. Degenerative change in osteopenia lumbar spine and both hips. Prostate calcifications are noted. Aortoiliac atherosclerotic vascular calcification. Diffuse osteopenia degenerative changes lumbar spine and both hips. Mild blunting of the right costophrenic angle again noted suggesting scarring and/or tiny effusion. IMPRESSION: NG tube noted with its tip in the stomach. Dilated loops of small and large bowel are again noted without significant interim change. No free air identified. Electronically Signed   By: Maisie Fus  Register   On: 05/17/2018 07:32   Dg Abd Portable 1v  Result Date: 05/15/2018 CLINICAL DATA:  NG tube placement EXAM: PORTABLE ABDOMEN - 1 VIEW COMPARISON:  05/15/2018 FINDINGS: NG tube is in placed into the fundus of the stomach. Continued gaseous distention of visualized upper abdominal bowel loops. IMPRESSION: NG tube in the fundus of the stomach Electronically Signed   By: Charlett Nose M.D.   On: 05/15/2018 20:11   Dg Abd Portable 1v  Result Date: 05/15/2018 CLINICAL DATA:  Small bowel obstruction. EXAM: PORTABLE ABDOMEN - 1 VIEW COMPARISON:  One-view abdomen 05/14/2018 FINDINGS: Patient's retained left.  Heart size normal.  Lung bases are clear. Extend loops of large and small bowel are stable. There is no free  air or pneumatosis. Surgical clips are noted. IMPRESSION: 1. Similar appearance of distended large and small bowel consistent with obstruction or ileus. Electronically Signed   By: Marin Roberts M.D.   On: 05/15/2018 08:10   Dg Abd Portable  1v  Result Date: 05/14/2018 CLINICAL DATA:  Abdominal pain EXAM: PORTABLE ABDOMEN - 1 VIEW COMPARISON:  05/13/2018 FINDINGS: Interval NG tube removal. Some interval improvement in gaseous distention of the bowel. Stool and air throughout the transverse colon. Postop staples again noted. Air in the rectum. No significant interval change. Degenerative changes of the spine. Basilar chronic interstitial changes and atelectasis. IMPRESSION: NG tube removed. Stable nonspecific bowel gas pattern, suspect resolving obstruction or ileus. Little interval change. Electronically Signed   By: Judie Petit.  Shick M.D.   On: 05/14/2018 10:23   Dg Abd Portable 1v  Result Date: 05/13/2018 CLINICAL DATA:  Abdominal pain, check NG placement EXAM: PORTABLE ABDOMEN - 1 VIEW COMPARISON:  05/09/2018 FINDINGS: Nasogastric catheter is noted with the tip in the stomach. Proximal side port lies in the distal esophagus. This is stable from the prior exam. Scattered large and small bowel gas is noted. Postsurgical changes are now seen. Mild small bowel dilatation is noted which may be related to a postoperative ileus. IMPRESSION: Mild postoperative ileus. Nasogastric catheter as described. Electronically Signed   By: Alcide Clever M.D.   On: 05/13/2018 09:56   Dg Abd Portable 1v  Result Date: 05/09/2018 CLINICAL DATA:  Small-bowel obstruction EXAM: PORTABLE ABDOMEN - 1 VIEW COMPARISON:  05/08/2018; 05/06/2018; 05/05/2018 FINDINGS: Re-demonstrated gaseous distention of several loops of small bowel with index loop of small bowel within the left mid hemiabdomen measuring 3.6 cm in diameter. Moderate colonic stool burden. No supine evidence of pneumoperitoneum. No pneumatosis or portal venous gas.  Presumed calcified fibroid overlies the left hemipelvis. Stable position of support apparatus. No acute osseus abnormalities. IMPRESSION: No change to slight improvement in suspected small-bowel obstruction. Electronically Signed   By: Simonne Come M.D.   On: 05/09/2018 07:47   Dg Abd Portable 1v  Result Date: 05/08/2018 CLINICAL DATA:  Follow up small bowel obstruction EXAM: PORTABLE ABDOMEN - 1 VIEW COMPARISON:  05/06/2018 FINDINGS: Scattered large and small bowel gas is noted. Multiple dilated loops of small bowel are again identified and stable in appearance. Nasogastric catheter is again noted within the stomach. No free air is seen. Degenerative changes of the lumbar spine are noted. IMPRESSION: Persistent small bowel dilatation. No new focal abnormality is noted. Electronically Signed   By: Alcide Clever M.D.   On: 05/08/2018 08:36   Dg Abd Portable 1v  Result Date: 05/06/2018 CLINICAL DATA:  Small bowel obstruction EXAM: PORTABLE ABDOMEN - 1 VIEW COMPARISON:  Portable exam at 0628 hours compared to 05/05/2018 FINDINGS: Stool scattered throughout colon. Persistent dilatation of small bowel loops in the LEFT mid abdomen up to 4.9 cm diameter. No bowel wall thickening. Bones demineralized. No definite renal calcifications. IMPRESSION: Persistent small bowel dilatation consistent with small bowel obstruction. Little interval change. Electronically Signed   By: Ulyses Southward M.D.   On: 05/06/2018 10:21   Dg Abd Portable 1v-small Bowel Obstruction Protocol-initial, 8 Hr Delay  Result Date: 05/04/2018 CLINICAL DATA:  Small bowel obstruction protocol. 8 hour delay. EXAM: PORTABLE ABDOMEN - 1 VIEW COMPARISON:  05/03/2018 FINDINGS: Gaseous distention of left upper quadrant small bowel. Enteric tube with tip projected over the mid abdomen consistent with location in the upper stomach. Contrast material is demonstrated in the dilated small bowel. No contrast material is identified in the colon. This is  suggestive of high-grade obstruction. Degenerative changes in the spine. IMPRESSION: Contrast material is demonstrated in the dilated small bowel but not in the colon consistent with high-grade obstruction. Electronically Signed  By: Burman Nieves M.D.   On: 05/04/2018 01:55   Dg Abd Portable 1v-small Bowel Obstruction Protocol-initial, 8 Hr Delay  Result Date: 05/01/2018 CLINICAL DATA:  Small bowel obstruction. EXAM: PORTABLE ABDOMEN - 1 VIEW COMPARISON:  Radiograph earlier this day at 1109 hour, CT yesterday. FINDINGS: Enteric tube in place with tip in the stomach, side-port just beyond the gastroesophageal junction. Improving small bowel dilatation in the left abdomen. Moderate stool in the right and transverse colon. No evidence of free air. Pelvic calcification may be stone in the urinary bladder or exophytic prostate calcification. IMPRESSION: Improving small bowel dilatation in the left abdomen since radiograph earlier this day. Electronically Signed   By: Narda Rutherford M.D.   On: 05/01/2018 21:14   Dg Abd Portable 1v-small Bowel Protocol-position Verification  Result Date: 05/01/2018 CLINICAL DATA:  Nasogastric tube placement EXAM: PORTABLE ABDOMEN - 1 VIEW COMPARISON:  Portable exam 1109 hours compared to 04/30/2018 FINDINGS: Tip of nasogastric tube projects over mid stomach. Dilated small bowel loops are seen in the LEFT mid abdomen. Increased stool in RIGHT colon. Lung bases appear emphysematous with subsegmental atelectasis at LEFT base. Bones demineralized. IMPRESSION: Tip of nasogastric tube projects over mid stomach. Small bowel dilatation in LEFT mid abdomen with increased stool in RIGHT colon. Electronically Signed   By: Ulyses Southward M.D.   On: 05/01/2018 11:22   Dg Abd Portable 1v  Result Date: 04/30/2018 CLINICAL DATA:  67 year old male with vomiting. EXAM: PORTABLE ABDOMEN - 1 VIEW COMPARISON:  04/29/2018 abdominal radiographs. FINDINGS: Portable AP semi upright and supine views  at 0847 hours. Enteric tube has been placed and the stomach now appears decompressed. NG tube side hole at the gastric body. Non obstructed bowel gas pattern. Moderate volume of retained stool redemonstrated in the transverse colon and distal sigmoid/rectum. Negative visible lung bases. No pneumoperitoneum. Stable abdominal and pelvic visceral contours. Dystrophic calcification in the pelvis. Stable pelvic catheter, likely a Foley. No acute osseous abnormality identified. IMPRESSION: 1. NG tube in place with resolved gastric distention. 2. Non obstructed bowel gas pattern with moderate volume of retained stool. Electronically Signed   By: Odessa Fleming M.D.   On: 04/30/2018 09:06   Dg Abd Portable 1v  Result Date: 04/29/2018 CLINICAL DATA:  Nausea and vomiting EXAM: PORTABLE ABDOMEN - 1 VIEW COMPARISON:  None. FINDINGS: Gaseous distention of the stomach. Formed stool throughout the colon. No concerning mass effect or calcification. IMPRESSION: Prominent gaseous distension of the stomach. Constipated appearance. Electronically Signed   By: Marnee Spring M.D.   On: 04/29/2018 14:44   [place] Ekg Site Rite  Result Date: 05/04/2018 If Site Rite image not attached, placement could not be confirmed due to current cardiac rhythm.    LOS: 20 days   Jeoffrey Massed, MD  [facility]  If 7PM-7AM, please contact night-coverage  Please page via [email or web address]-click on MD name and type text message  05/19/2018, 1:27 PM

## 2018-05-20 ENCOUNTER — Inpatient Hospital Stay (HOSPITAL_COMMUNITY): Payer: Medicare Other

## 2018-05-20 LAB — COMPREHENSIVE METABOLIC PANEL
ALBUMIN: 2.5 g/dL — AB (ref 3.5–5.0)
ALK PHOS: 134 U/L — AB (ref 38–126)
ALT: 14 U/L (ref 0–44)
ANION GAP: 7 (ref 5–15)
AST: 12 U/L — ABNORMAL LOW (ref 15–41)
BUN: 25 mg/dL — ABNORMAL HIGH (ref 8–23)
CALCIUM: 9 mg/dL (ref 8.9–10.3)
CO2: 25 mmol/L (ref 22–32)
Chloride: 105 mmol/L (ref 98–111)
Creatinine, Ser: 0.7 mg/dL (ref 0.61–1.24)
GFR calc Af Amer: >60 mL/min (ref >60)
GFR calc non Af Amer: >60 mL/min (ref >60)
GLUCOSE: 107 mg/dL — AB (ref 70–99)
Potassium: 4.5 mmol/L (ref 3.5–5.1)
SODIUM: 137 mmol/L (ref 135–145)
Total Bilirubin: 0.3 mg/dL (ref 0.3–1.2)
Total Protein: 6.6 g/dL (ref 6.5–8.1)

## 2018-05-20 LAB — CULTURE, BLOOD (ROUTINE X 2)
Culture: NO GROWTH
Culture: NO GROWTH

## 2018-05-20 LAB — MAGNESIUM: Magnesium: 2.1 mg/dL (ref 1.7–2.4)

## 2018-05-20 LAB — GLUCOSE, CAPILLARY
GLUCOSE-CAPILLARY: 100 mg/dL — AB (ref 70–99)
Glucose-Capillary: 100 mg/dL — ABNORMAL HIGH (ref 70–99)

## 2018-05-20 MED ORDER — DIATRIZOATE MEGLUMINE & SODIUM 66-10 % PO SOLN
90.0000 mL | Freq: Once | ORAL | Status: AC
  Administered 2018-05-21: 90 mL via NASOGASTRIC
  Filled 2018-05-20: qty 90

## 2018-05-20 MED ORDER — TRAVASOL 10 % IV SOLN
INTRAVENOUS | Status: DC
  Administered 2018-05-20: 17:00:00 via INTRAVENOUS
  Filled 2018-05-20: qty 936

## 2018-05-20 MED ORDER — SORBITOL 70 % SOLN
960.0000 mL | TOPICAL_OIL | Freq: Once | ORAL | Status: AC
  Administered 2018-05-20: 960 mL via RECTAL
  Filled 2018-05-20: qty 473

## 2018-05-20 MED ORDER — IOPAMIDOL (ISOVUE-300) INJECTION 61%
100.0000 mL | Freq: Once | INTRAVENOUS | Status: AC | PRN
  Administered 2018-05-20: 100 mL via INTRAVENOUS

## 2018-05-20 NOTE — Progress Notes (Signed)
Received call from patient's sister inquiring how the patient did overnight. Discussed with sister that patient had two episodes of emesis overnight and that a CT was ordered. Sisters voice began to raise and repeatedly said "why did he vomit?" over and over again. The sister then became verbally abusive stating she is in the middle of filling a lawsuit against [facility] nurses and how we are neglecting her brother. She said she will at our facility shortly to address this issue.

## 2018-05-20 NOTE — Progress Notes (Signed)
Placed NG per orders. X-Ray obtained and said to advance NG tube by 2-3 cm. NG tube advanced and awaiting results to hook patient up to LIS. Sister at bedside and informed of plan of care.

## 2018-05-20 NOTE — Progress Notes (Signed)
PHARMACY - ADULT TOTAL PARENTERAL NUTRITION CONSULT NOTE   Pharmacy Consult for TPN Indication: SBO  Patient Measurements: Height: 5\' 6"  (167.6 cm) Weight: 110 lb (49.9 kg) IBW/kg (Calculated) : 63.8 TPN AdjBW (KG): 49.9 Body mass index is 17.75 kg/m.  Assessment:  27 YOM admitted on 10/4 with sepsis. He was found to have pneumonia and a possible SBO on CT on 10/5. Pt has a history of SBO s/p colectomy in 1993 (history (in the 1990s) of trauma causing small bowel injury requiring laparotomy). An NG tube was placed with improving small bowel dilatation but patient had 3-4 episodes of vomiting overnight on 10/7-10/8 and the NG tube had to be replaced due to incorrect placement. Pharmacy now consulted to manage TPN due to worsening SBO and concern for adhesions that require surgery.  Actual body is significantly below ideal body weight.  Of note, patient is extremely cachectic. Patient hx unreliable due to mental status.  GI: Pre-albumin down to 15.7. S/p ex-lap with LOA, SBR and anastomosis on 10/15. NGT not clamped as ordered 10/22, clamped 10/23 and allowing some clears, going slow per Surgery. +BS, no BM recorded. Xray w/ colonic "ileus". Emesis x 1 overnight  Endo: No hx DM - CBGs controlled, SSI d/c'd 10/14 Lytes: WNL, (Goal K>/=4 and Mg>2/= with ileus) Renal: SCr stable WNL, UOP 1.4 cc/kg/hr, LR at [facility] Pulm: RA Cards: VSS. Lasix IV x 1 on 10/20 Hepatobil: LFTs / Tbili / TG wnl, Lipase elevated 10/4  TG 91 > 67 Neuro: Schizophrenia. Dementia. Benztropine (held since 10/20), Zyprexa bid, IV valproate ID: s/p abx for Klebsiella UTI and aspiration PNA. Afebrile, WBC WNL Heme: SQ heparin. Plts stable. Transfuse 10/20 > Hg low stable  TPN Access: double lumen PICC placed 10/10 TPN start date: 05/05/18  Nutritional Goals (per RD rec on 10/21): 1550-1750kCal, 85-100 gm protein, >1.5 L fluid per day  Current Nutrition:  TPN Clear liquid diet started 10/24 - no intake  documented  Plan:  Continue TPN at 65 mL/hr, providing 94 g of protein, 218 g of dextrose, and 55 g of lipids for a total of 1,663 kCals per day, meeting 100% of patient needs Electrolytes in TPN: Continue, Cl:Ac ratio 1:1 Add MVI, trace elements, Pepcid 40mg  to TPN Continue to monitor CBGs and adjust as needed Continue LR at 10 ml/hr per MD F/U TPN labs in AM, plans for advancing diet / weaning TPN   Jeanella Cara, PharmD, [facility] Clinical Pharmacist Please see AMION for all Pharmacists' Contact Phone Numbers 05/20/2018, 8:58 AM

## 2018-05-20 NOTE — Progress Notes (Signed)
PROGRESS NOTE        PATIENT DETAILS Name: Jeffrey Davila Age: 67 y.o. Sex: male Date of Birth: Dec 21, 1950 Admit Date: 04/29/2018 Admitting Physician Starleen Arms, MD PCP:Uba, Reuel Boom, MD  Brief Narrative: Patient is a 67 y.o. male with history of schizophrenia/?  Cognitive dysfunction (dementia), hypertension, BPH, remote history (in the 1990s) of trauma causing small bowel injury requiring laparotomy-mostly wheelchair-bound-presented to the hospital with approximately 1 week history of intermittent vomiting, confusion for a few days prior to this hospital admission-presented with sepsis secondary to UTI, vomiting and significant constipation.  Patient was started on IV antibiotics and admitted to the hospital service,initial imaging was suggestive of constipation causing gastric distention-NG tube was placed, however upon further evaluation with a CT scan he was found  to have a small bowel obstruction.  General surgery was consulted, even with n.p.o. status/NG tube decompression-he continues to have SBO-patient's sister has refused to sign consent for surgery and wanted patient to receive several days of TNA before she consented to surgery.  Subsequently underwent laparotomy with lysis of adhesions on 10/15, postoperative course complicated by ileus and low-grade fever.  See below for further details.  Subjective: Vomited x2 overnight.  BM yet.  Assessment/Plan: Small bowel obstruction: Underwent laparotomy with lysis of adhesions on 10/15-postoperative course complicated by ileus.  After successfully tolerating NG clamp for 24 hours without vomiting-NG tube was discontinued on 10/24-however patient vomited last night-after discussion with general surgery-we reordered a CT scan of the abdomen-which demonstrates significant stool burden and a possible small bowel obstruction.  General surgery recommending reinsertion of NG tube-and a enema.    Constipation: See  above.  MiraLAX given on 10/24 without response.  Low-grade fever on 10/19: No foci of infection apparent-could have been from atelectasis-not on any antimicrobial therapy.  Blood culture on 10/20 neg so far, chest x-ray on 10/20 neg for PNA.  Remains afebrile since then.  Follow.  Sepsis secondary to complicated UTI: Sepsis pathophysiology has resolved, urine culture positive for Klebsiella-has completed a course of IV Rocephin.  Blood cultures remain negative.    Aspiration pneumonia: This occurred in the early part of his hospitalization-felt to be secondary to o SBO/vomiting with underlying frailty.  Has completed a course of antimicrobial therapy.  Continue to mobilize as much as possible.    Acute metabolic encephalopathy: Improved and back to his usual baseline.  Encephalopathy was present on initial presentation-this was felt secondary to UTI, and developing SBO.Marland Kitchen  Anemia: Suspect anemia secondary to acute illness, and IV fluid dilution.  Has been transfused a total of 3 units so far, last transfusion on 10/20.  Follow CBC periodically.  No indication of any overt blood loss at this time.   AKI: Hemodynamically mediated, resolved.  Hypernatremia: Secondary to vomiting/dehydration-resolved.  Hyperkalemia: Resolved  Schizophrenia: Appears stable-continue IV Depakote and olanzapine.    Hypertension: Blood pressure appears stable-continue to hold antihypertensives.   Severe malnutrition: Continue TNA-we will start low-dose nutritional supplements when diet is more stable.    Deconditioning/debility: Suspect has significant amount of debility at baseline-he is very cachectic-deconditioning/debility has worsened due to acute illness/SBO.  PT following.  Other issues:  this MD is familiar with this patient-I took care of during the initial part of his hospital stay.  Patient's sister initially wanted guarantees with surgery, wanted transfer to [facility] (refused by [facility]).  See my prior notes.   I have reviewed notes by Dr. Marena Chancy issues with patient's sister refusing NG tube placement, wanting transfer to [facility] (refused by [facility] [place] [facility] [place]).  Per nursing staff-she has on occasion been very disruptive to their workflow and in the care of the patient.  I completely agree with Dr. Marena Chancy concerns whether patient's sister is the best person to be making decisions for this patient-given numerous documentations by nursing staff and by prior MDs.  Social work, risk Insurance account manager, Camera operator and palliative care following.   DVT Prophylaxis: Prophylactic Heparin   Code Status: Full code   Family Communication: Spoke with sister at bedside this afternoon-explained CT scan findings.  Nursing director present.  Disposition Plan: Remain inpatient-ultimately to SNF when he is more stable.  Antimicrobial agents: Anti-infectives (From admission, onward)   Start     Dose/Rate Route Frequency Ordered Stop   05/10/18 0941  ceFAZolin (ANCEF) 2-4 GM/100ML-% IVPB    Note to Pharmacy:  [name]   : cabinet override      05/10/18 0941 05/10/18 2144   05/01/18 1215  metroNIDAZOLE (FLAGYL) IVPB 500 mg  Status:  Discontinued     500 mg 100 mL/hr over 60 Minutes Intravenous Every 8 hours 05/01/18 1201 05/06/18 1153   04/30/18 1100  cefTRIAXone (ROCEPHIN) 2 g in sodium chloride 0.9 % 100 mL IVPB  Status:  Discontinued     2 g 200 mL/hr over 30 Minutes Intravenous Every 24 hours 04/29/18 1416 05/06/18 1153   04/29/18 1430  cefTRIAXone (ROCEPHIN) 1 g in sodium chloride 0.9 % 100 mL IVPB     1 g 200 mL/hr over 30 Minutes Intravenous Every 24 hours 04/29/18 1416 04/30/18 1429   04/29/18 1130  cefTRIAXone (ROCEPHIN) 1 g in sodium chloride 0.9 % 100 mL IVPB     1 g 200 mL/hr over 30 Minutes Intravenous  Once 04/29/18 1118 04/29/18 1251      Procedures: None  CONSULTS:  None  Time spent: 25 minutes   MEDICATIONS: Scheduled Meds: .  acetaminophen  1,000 mg Oral Q6H  . benztropine  1 mg Oral BID  . Chlorhexidine Gluconate Cloth  6 each Topical 2 times per day on Tue  . heparin  5,000 Units Subcutaneous Q8H  . lip balm  1 application Topical BID  . OLANZapine zydis  10 mg Oral BID  . polyethylene glycol  17 g Oral Daily  . polyvinyl alcohol  1 drop Both Eyes TID   Continuous Infusions: . sodium chloride 1,000 mL (05/14/18 1850)  . lactated ringers 10 mL/hr at 05/11/18 0617  . TPN ADULT (ION) 65 mL/hr at 05/20/18 0500  . TPN ADULT (ION)    . valproate sodium 250 mg (05/20/18 0839)   PRN Meds:.sodium chloride, albuterol, alum & mag hydroxide-simeth, bisacodyl, diphenhydrAMINE, magic mouthwash, menthol-cetylpyridinium, metoprolol tartrate, ondansetron (ZOFRAN) IV, phenol, sodium chloride flush, traMADol   PHYSICAL EXAM: Vital signs: Vitals:   05/19/18 0415 05/19/18 1125 05/19/18 2119 05/20/18 0528  BP: 99/69 107/77 107/64 122/75  Pulse: 93 (!) 108 91 89  Resp: 18 18 18 17   Temp: 99.3 F (37.4 C) 99.2 F (37.3 C) 98.7 F (37.1 C) 99 F (37.2 C)  TempSrc: Oral Oral Oral Oral  SpO2: 100% 100% 100% 100%  Weight:      Height:       Filed Weights   04/29/18 0842  Weight: 49.9 kg   Body mass index is 17.75 kg/m.   General  appearance:Awake, alert, not in any distress.  Eyes:no scleral icterus. HEENT: Atraumatic and Normocephalic Neck: supple, no JVD. Resp:Good air entry bilaterally,no rales or rhonchi CVS: S1 S2 regular, no murmurs.  GI: Bowel sounds present, intact surgical incision-no erythema or drainage around the surgical scar. Extremities: B/L Lower Ext shows no edema, both legs are warm to touch Neurology:  Non focal and but appears weak. Musculoskeletal:No digital cyanosis Skin:No Rash, warm and dry Wounds:N/A   I have personally reviewed following labs and imaging studies  LABORATORY DATA: CBC: Recent Labs  Lab 05/14/18 0403 05/15/18 0419 05/15/18 1430 05/16/18 0401  WBC 10.4 8.0   --  8.3  NEUTROABS  --   --   --  5.6  HGB 7.0* 6.9* 9.1* 8.6*  HCT 22.4* 21.8* 28.7* 27.5*  MCV 95.3 95.2  --  90.8  PLT 576* 586*  --  608*    Basic Metabolic Panel: Recent Labs  Lab 05/16/18 0401 05/17/18 0412 05/18/18 0441 05/19/18 0408 05/20/18 0402  NA 138 136 137 137 137  K 4.0 4.1 4.2 4.3 4.5  CL 110 107 107 107 105  CO2 24 23 23 23 25   GLUCOSE 99 100* 104* 90 107*  BUN 21 22 23 22  25*  CREATININE 0.62 0.61 0.61 0.66 0.70  CALCIUM 8.5* 8.7* 8.6* 8.8* 9.0  MG 2.0 2.1 2.0 1.9 2.1  PHOS 3.6 3.5 3.9 3.5  --     GFR: Estimated Creatinine Clearance: 64.1 mL/min (by C-G formula based on SCr of 0.7 mg/dL).  Liver Function Tests: Recent Labs  Lab 05/16/18 0401 05/17/18 0412 05/18/18 0441 05/19/18 0408 05/20/18 0402  AST 15 14* 12* 17 12*  ALT 17 18 16 15 14   ALKPHOS 142* 168* 149* 148* 134*  BILITOT 0.4 0.4 0.4 0.2* 0.3  PROT 6.1* 6.1* 6.2* 6.3* 6.6  ALBUMIN 2.1* 2.1* 2.2* 2.3* 2.5*   No results for input(s): LIPASE, AMYLASE in the last 168 hours. No results for input(s): AMMONIA in the last 168 hours.  Coagulation Profile: No results for input(s): INR, PROTIME in the last 168 hours.  Cardiac Enzymes: No results for input(s): CKTOTAL, CKMB, CKMBINDEX, TROPONINI in the last 168 hours.  BNP (last 3 results) No results for input(s): PROBNP in the last 8760 hours.  HbA1C: No results for input(s): HGBA1C in the last 72 hours.  CBG: Recent Labs  Lab 05/18/18 0815 05/19/18 0004 05/19/18 0817 05/19/18 1715 05/20/18 0203  GLUCAP 107* 98 106* 106* 100*    Lipid Profile: No results for input(s): CHOL, HDL, LDLCALC, TRIG, CHOLHDL, LDLDIRECT in the last 72 hours.  Thyroid Function Tests: No results for input(s): TSH, T4TOTAL, FREET4, T3FREE, THYROIDAB in the last 72 hours.  Anemia Panel: No results for input(s): VITAMINB12, FOLATE, FERRITIN, TIBC, IRON, RETICCTPCT in the last 72 hours.  Urine analysis:    Component Value Date/Time   COLORURINE  YELLOW 05/15/2018 1109   APPEARANCEUR CLEAR 05/15/2018 1109   LABSPEC 1.013 05/15/2018 1109   PHURINE 7.0 05/15/2018 1109   GLUCOSEU NEGATIVE 05/15/2018 1109   HGBUR SMALL (A) 05/15/2018 1109   BILIRUBINUR NEGATIVE 05/15/2018 1109   KETONESUR NEGATIVE 05/15/2018 1109   PROTEINUR NEGATIVE 05/15/2018 1109   UROBILINOGEN 1.0 06/08/2010 1523   NITRITE NEGATIVE 05/15/2018 1109   LEUKOCYTESUR TRACE (A) 05/15/2018 1109    Sepsis Labs: Lactic Acid, Venous    Component Value Date/Time   LATICACIDVEN 1.07 04/29/2018 1334    MICROBIOLOGY: Recent Results (from the past 240 hour(s))  Culture, blood (routine  x 2)     Status: None   Collection Time: 05/15/18  3:15 PM  Result Value Ref Range Status   Specimen Description BLOOD SITE NOT SPECIFIED  Final   Special Requests   Final    BOTTLES DRAWN AEROBIC ONLY Blood Culture results may not be optimal due to an inadequate volume of blood received in culture bottles   Culture   Final    NO GROWTH 5 DAYS Performed at [facility], [address] [place], [place] [address]    Report Status 05/20/2018 FINAL  Final  Culture, blood (routine x 2)     Status: None   Collection Time: 05/15/18  3:22 PM  Result Value Ref Range Status   Specimen Description BLOOD SITE NOT SPECIFIED  Final   Special Requests   Final    BOTTLES DRAWN AEROBIC ONLY Blood Culture results may not be optimal due to an inadequate volume of blood received in culture bottles   Culture   Final    NO GROWTH 5 DAYS Performed at [facility], [address], [place], [place] [address]    Report Status 05/20/2018 FINAL  Final    RADIOLOGY STUDIES/RESULTS: Ct Abdomen Pelvis Wo Contrast  Result Date: 04/30/2018 CLINICAL DATA:  Nausea, bilious vomiting EXAM: CT ABDOMEN AND PELVIS WITHOUT CONTRAST TECHNIQUE: Multidetector CT imaging of the abdomen and pelvis was performed following the standard protocol without IV contrast. Sagittal and coronal MPR images reconstructed  from axial data set. Oral contrast was not administered. COMPARISON:  None FINDINGS: Lower chest: Emphysematous changes with BILATERAL lower lobe infiltrates consistent with either pneumonia or aspiration. Hepatobiliary: Liver unremarkable. Gallbladder not well visualized due to streak artifacts, grossly unremarkable. Pancreas: Normal appearance Spleen: Normal appearance Adrenals/Urinary Tract: Question adrenal thickening bilaterally. No obvious renal mass or hydronephrosis. Ureters not visualized. Foley catheter decompresses urinary bladder. Stomach/Bowel: Nasogastric tube in stomach. Low-attenuation fluid distends stomach and multiple proximal small bowel loops, with small bowel loops up to 4.1 cm diameter. Distal small bowel loops and colon are decompressed. Scattered stool throughout colon. Findings likely represent mid small bowel obstruction. Appendix not visualized. Suspected rectal wall thickening. Vascular/Lymphatic: Atherosclerotic calcification aorta. Aorta normal caliber. Reproductive: Minimal prostatic enlargement and scattered prostatic calcifications. Other: No definite free air or free fluid. Nonspecific stranding of presacral fat. Musculoskeletal: No acute osseous findings. IMPRESSION: Dilated proximal and decompressed distal small bowel loops compatible with small bowel obstruction, likely in the mid small bowel. Due to lack of fat planes, streak artifacts, and lack of contrast, unable to localize site of obstruction. No evidence of perforation/free air. Question rectal wall thickening, recommend correlation with proctoscopy. Emphysematous changes with bibasilar pulmonary infiltrates either representing pneumonia or aspiration. Electronically Signed   By: Ulyses Southward M.D.   On: 04/30/2018 19:38   Dg Abd 1 View  Result Date: 05/16/2018 CLINICAL DATA:  Feeding tube advanced today. EXAM: ABDOMEN - 1 VIEW COMPARISON:  Plain films of the abdomen dated 05/15/2018 and 05/14/2018. FINDINGS: Enteric  tube is coiled in the stomach with tip at the level of the gastric fundus/cardia. Distended gas-filled loops of large and small bowel are again appreciated throughout the abdomen and upper pelvis. IMPRESSION: 1. Feeding tube is coiled in the stomach with tip at the level of the gastric fundus/cardia. 2. Persistent gaseous distention of both large and small bowel loops throughout the abdomen and pelvis, compatible with previous reports of obstruction or ileus. Electronically Signed   By: Anne Ng.D.  On: 05/16/2018 20:26   Dg Abd 1 View  Result Date: 05/03/2018 CLINICAL DATA:  Small bowel obstruction EXAM: ABDOMEN - 1 VIEW COMPARISON:  05/02/2018 FINDINGS: NG tube is in the stomach. Gaseous distention of the stomach and left abdominal small bowel loops. Gas and stool within nondistended colon. No free air or organomegaly. IMPRESSION: Gaseous distention of stomach and left abdominal small bowel loops likely reflecting small bowel obstruction. NG tube is in the stomach. Electronically Signed   By: Charlett Nose M.D.   On: 05/03/2018 10:05   Dg Abd 1 View  Result Date: 05/02/2018 CLINICAL DATA:  Small bowel obstruction EXAM: ABDOMEN - 1 VIEW COMPARISON:  May 01, 2018 abdominal radiograph and CT abdomen and pelvis April 30, 2018 FINDINGS: Nasogastric tube no longer present. There is slightly less bowel dilatation in the left upper quadrant compared to 1 day prior. There is moderate air in the stomach. No bowel dilatation elsewhere. No air-fluid levels. No free air. There is moderate stool in the colon. There is atelectatic change in the right lung base. IMPRESSION: Nasogastric tube no longer appreciable. Less small bowel dilatation in the left upper quadrant. Question resolving bowel obstruction. No free air evident. Electronically Signed   By: Bretta Bang III M.D.   On: 05/02/2018 14:20   Ct Head Wo Contrast  Result Date: 04/29/2018 CLINICAL DATA:  Altered level of consciousness increased  over last 2 days, 2 episodes of nausea and vomiting, history of prior brain injury, dementia, former smoker, hypertension EXAM: CT HEAD WITHOUT CONTRAST TECHNIQUE: Contiguous axial images were obtained from the base of the skull through the vertex without intravenous contrast. COMPARISON:  06/08/2010 FINDINGS: Brain: Generalized atrophy. Normal ventricular morphology. No midline shift or mass effect. Small vessel chronic ischemic changes of deep cerebral white matter. 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 No intracranial hemorrhage, mass lesion, evidence of acute infarction, or extra-axial fluid collection. Vascular: Minimal atherosclerotic calcification of internal carotid arteries at skull base Skull: Intact Sinuses/Orbits: Clear Other: N/A IMPRESSION: Atrophy with small vessel chronic ischemic changes of deep cerebral white matter. 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 No acute intracranial abnormalities. Electronically Signed   By: Ulyses Southward M.D.   On: 04/29/2018 09:58   Ct Abdomen Pelvis W Contrast  Result Date: 05/20/2018 CLINICAL DATA:  Nausea and vomiting. 10 days status post laparoscopic lysis of adhesions and bowel resection. EXAM: CT ABDOMEN AND PELVIS WITH CONTRAST TECHNIQUE: Multidetector CT imaging of the abdomen and pelvis was performed using the standard protocol following bolus administration of intravenous contrast. CONTRAST:  ISOVUE-300 IOPAMIDOL (ISOVUE-300) INJECTION 61% COMPARISON:  04/30/2018 FINDINGS: Lower Chest: Decreased airspace disease seen in both lower lobes, likely due to resolving pneumonia. Hepatobiliary: A few tiny left hepatic lobe cysts are seen. No hepatic masses identified. Tiny calcified gallstones or layering sludge noted, however there is no evidence of cholecystitis or biliary ductal dilatation. Pancreas:  No mass or inflammatory changes. Spleen: Within normal limits in size and appearance. Adrenals/Urinary Tract: No masses identified.  No evidence of hydronephrosis. Stomach/Bowel: Small amount of free air is seen, likely postoperative in etiology. No abscess identified. Multiple moderately dilated fluid-filled small bowel loops are seen in the left abdomen, with transition to nondilated small bowel in the left lower quadrant in area of surgical staples. This is consistent with a small-bowel obstruction. No mass or inflammatory process identified. Moderate amount of stool again seen in the right colon. Vascular/Lymphatic: No pathologically enlarged lymph nodes. No abdominal  aortic aneurysm. Reproductive: Mild-to-moderate enlargement of prostate gland shows no significant change. Other: Subcutaneous emphysema is seen in the left anterior abdominal wall, likely postoperative in etiology. Musculoskeletal:  No suspicious bone lesions identified. IMPRESSION: Small bowel obstruction, with transition point in left lower quadrant. This may be due to adhesion, as no obstructing mass or inflammatory process identified. Small amount of postop intraperitoneal air and subcutaneous emphysema. No evidence of abscess. Cholelithiasis.  No radiographic evidence of cholecystitis. Decreased bilateral lower lobe airspace disease, consistent with resolving pneumonia or inflammatory process. Electronically Signed   By: Myles Rosenthal M.D.   On: 05/20/2018 13:41   Dg Chest Port 1 View  Result Date: 05/15/2018 CLINICAL DATA:  Shortness of breath. Patient 5 days postop bowel resection with lysis of adhesions. EXAM: PORTABLE CHEST 1 VIEW COMPARISON:  05/11/2018 FINDINGS: Right-sided PICC line with tip over the SVC. Lungs are adequately inflated with subtle left retrocardiac opacification which may be due to vascular crowding or atelectasis and less likely infection. Remainder of the lungs are clear. Cardiomediastinal silhouette is within normal. Persistent evidence of free peritoneal air compatible with patient's recent surgery. IMPRESSION: Mild left base opacification  likely atelectasis or vascular crowding and less likely infection. Persistent free peritoneal air compatible recent postop state. Right-sided PICC line with tip over the SVC. Electronically Signed   By: Elberta Fortis M.D.   On: 05/15/2018 09:38   Dg Chest Port 1 View  Result Date: 05/11/2018 CLINICAL DATA:  Shortness of breath, hypertension, pneumonia, dementia, former smoker EXAM: PORTABLE CHEST 1 VIEW COMPARISON:  Portable exam 0945 hours compared to 05/09/2018 FINDINGS: Nasogastric tube extends into stomach. RIGHT arm PICC line tip projects over SVC. Normal heart size and pulmonary vascularity. Elongation of thoracic aorta. Lungs appear emphysematous but clear. No pleural effusion or pneumothorax. Bones demineralized. Free air identified under the hemidiaphragms bilaterally; EHR indicates patient underwent a laparoscopic procedure on 05/10/2018, small-bowel resection and lysis of adhesions for small-bowel obstruction prior operative note. IMPRESSION: COPD changes without infiltrate. Free air consistent with abdominal surgery 1 day ago. Electronically Signed   By: Ulyses Southward M.D.   On: 05/11/2018 10:21   Dg Chest Port 1 View  Result Date: 05/09/2018 CLINICAL DATA:  Cough EXAM: PORTABLE CHEST 1 VIEW COMPARISON:  05/04/2018 FINDINGS: NG tube is stable. Right upper extremity PICC placed. Tip is at the cavoatrial junction. Lungs remain hyperaerated and clear. Small right pleural effusion is stable. No pneumothorax. Aorta remains somewhat prominent due to tortuosity and rotation of the thorax. IMPRESSION: New right upper extremity PICC with its tip at the cavoatrial junction. No active cardiopulmonary disease. Electronically Signed   By: Jolaine Click M.D.   On: 05/09/2018 07:37   Dg Chest Port 1 View  Result Date: 04/29/2018 CLINICAL DATA:  Altered mental status EXAM: PORTABLE CHEST 1 VIEW COMPARISON:  April 12, 2018 FINDINGS: There is mild scarring in the lateral right base and in the medial right  lower lung region. There is no edema or consolidation. The heart size and pulmonary vascularity are normal. No adenopathy. There is a recent appearing fracture of the posterior right seventh rib with alignment essentially anatomic. An older fracture of the posterior right eighth rib is noted. No pneumothorax. IMPRESSION: Recent appearing fracture posterior right seventh rib. Older fracture posterior right eighth rib noted. Areas of mild scarring noted on the right. No edema or consolidation. Stable cardiac silhouette. Electronically Signed   By: Bretta Bang III M.D.   On: 04/29/2018 08:46  Dg Chest Port 1v Same Day  Result Date: 05/01/2018 CLINICAL DATA:  history of schizophrenia, hypertension, BPH-mostly wheelchair-bound-presented to the hospital with approximately 1 week history of intermittent vomiting, confusion for a few days prior to this hospital admission-presented with sepsis secondary to UTI, and significant constipation. C/o SOB EXAM: PORTABLE CHEST - 1 VIEW SAME DAY COMPARISON:  04/29/2018 FINDINGS: Lungs are hyperinflated. Subsegmental atelectasis or early infiltrate at the left lung base, new since previous. Right lung clear. Heart size normal.  Tortuous ectatic thoracic aorta. No effusion. No pneumothorax. Right anterolateral fourth rib fracture, age indeterminate. Nasogastric tube extends to the stomach. IMPRESSION: 1. New patchy infiltrate or subsegmental atelectasis at the left lung base. 2. Right fourth rib fracture without pneumothorax. 3. Nasogastric tube placement to the stomach. Electronically Signed   By: Corlis Leak M.D.   On: 05/01/2018 08:24   Dg Abd 2 Views  Result Date: 05/05/2018 CLINICAL DATA:  Vomiting.  History of right lumpectomy. EXAM: ABDOMEN - 2 VIEW COMPARISON:  05/04/2018 FINDINGS: Improve dilation of small bowel loops with residual borderline gas-filled distended small bowel loops in the left upper quadrant of the abdomen. Normal colonic bowel gas pattern. No  evidence of free intra-abdominal gas. Normal cardiac silhouette. Tortuosity of the thoracic aorta. Right pleural reflection versus small pleural effusion. No lobar airspace consolidation. Enteric catheter tip within the expected location the gastric body, side hole at the GE junction level. Right PICC line terminates in the expected location of the cavoatrial junction. IMPRESSION: Improved appearance of small-bowel obstruction. Enteric catheter with side hole at the level of the GE junction. Advancement may be considered. Possible small right pleural effusion. Electronically Signed   By: Ted Mcalpine M.D.   On: 05/05/2018 12:51   Dg Abd Acute W/chest  Result Date: 05/04/2018 CLINICAL DATA:  Follow-up small bowel obstruction. EXAM: DG ABDOMEN ACUTE W/ 1V CHEST COMPARISON:  Abdominal x-rays 05/04/1999 19 dating back to 04/29/2018. CT abdomen and pelvis 04/30/2018. Chest x-rays 05/01/2018, 04/12/2018. FINDINGS: Nasogastric tube tip in the fundus of the stomach. Persistent marked gaseous distension of the jejunum in the LEFT UPPER QUADRANT, slowly progressive over the past 3 days, with air-fluid levels on the LATERAL decubitus image. No evidence of free intraperitoneal air. Normal caliber colon with moderate stool burden. Calcified uterine fibroid in the pelvic midline. Cardiac silhouette normal in size, unchanged. Thoracic aorta tortuous and atherosclerotic. Hilar and mediastinal contours otherwise unremarkable. Interval improvement in aeration in the LEFT lung base, with only minimal patchy opacities persisting. Lungs otherwise clear. Emphysematous changes in both lungs with scarring in the RIGHT mid lung, unchanged. Pleuroparenchymal scarring at the RIGHT lung base which accounts for the blunting of the costophrenic angle, unchanged dating back to the original 04/12/2018 examination. IMPRESSION: 1. Persistent partial small bowel obstruction which has slowly worsened over the past 3 days. 2. No evidence of  free intraperitoneal air. 3. Improved aeration in the LEFT LOWER LOBE, with only mild atelectasis and/or pneumonia persisting. Electronically Signed   By: Hulan Saas M.D.   On: 05/04/2018 09:28   Dg Abd Portable 1v  Result Date: 05/17/2018 CLINICAL DATA:  Small-bowel obstruction. EXAM: PORTABLE ABDOMEN - 1 VIEW COMPARISON:  05/16/2018.  05/15/2018.  CT 04/30/2018. FINDINGS: Surgical staples noted over the abdomen. NG tube noted with its tip in the stomach. Distended loops of small and large bowel are again noted. Similar findings noted on prior exam. No free air. Degenerative change in osteopenia lumbar spine and both hips. Prostate calcifications are noted.  Aortoiliac atherosclerotic vascular calcification. Diffuse osteopenia degenerative changes lumbar spine and both hips. Mild blunting of the right costophrenic angle again noted suggesting scarring and/or tiny effusion. IMPRESSION: NG tube noted with its tip in the stomach. Dilated loops of small and large bowel are again noted without significant interim change. No free air identified. Electronically Signed   By: Maisie Fus  Register   On: 05/17/2018 07:32   Dg Abd Portable 1v  Result Date: 05/15/2018 CLINICAL DATA:  NG tube placement EXAM: PORTABLE ABDOMEN - 1 VIEW COMPARISON:  05/15/2018 FINDINGS: NG tube is in placed into the fundus of the stomach. Continued gaseous distention of visualized upper abdominal bowel loops. IMPRESSION: NG tube in the fundus of the stomach Electronically Signed   By: Charlett Nose M.D.   On: 05/15/2018 20:11   Dg Abd Portable 1v  Result Date: 05/15/2018 CLINICAL DATA:  Small bowel obstruction. EXAM: PORTABLE ABDOMEN - 1 VIEW COMPARISON:  One-view abdomen 05/14/2018 FINDINGS: Patient's retained left.  Heart size normal.  Lung bases are clear. Extend loops of large and small bowel are stable. There is no free air or pneumatosis. Surgical clips are noted. IMPRESSION: 1. Similar appearance of distended large and small  bowel consistent with obstruction or ileus. Electronically Signed   By: Marin Roberts M.D.   On: 05/15/2018 08:10   Dg Abd Portable 1v  Result Date: 05/14/2018 CLINICAL DATA:  Abdominal pain EXAM: PORTABLE ABDOMEN - 1 VIEW COMPARISON:  05/13/2018 FINDINGS: Interval NG tube removal. Some interval improvement in gaseous distention of the bowel. Stool and air throughout the transverse colon. Postop staples again noted. Air in the rectum. No significant interval change. Degenerative changes of the spine. Basilar chronic interstitial changes and atelectasis. IMPRESSION: NG tube removed. Stable nonspecific bowel gas pattern, suspect resolving obstruction or ileus. Little interval change. Electronically Signed   By: Judie Petit.  Shick M.D.   On: 05/14/2018 10:23   Dg Abd Portable 1v  Result Date: 05/13/2018 CLINICAL DATA:  Abdominal pain, check NG placement EXAM: PORTABLE ABDOMEN - 1 VIEW COMPARISON:  05/09/2018 FINDINGS: Nasogastric catheter is noted with the tip in the stomach. Proximal side port lies in the distal esophagus. This is stable from the prior exam. Scattered large and small bowel gas is noted. Postsurgical changes are now seen. Mild small bowel dilatation is noted which may be related to a postoperative ileus. IMPRESSION: Mild postoperative ileus. Nasogastric catheter as described. Electronically Signed   By: Alcide Clever M.D.   On: 05/13/2018 09:56   Dg Abd Portable 1v  Result Date: 05/09/2018 CLINICAL DATA:  Small-bowel obstruction EXAM: PORTABLE ABDOMEN - 1 VIEW COMPARISON:  05/08/2018; 05/06/2018; 05/05/2018 FINDINGS: Re-demonstrated gaseous distention of several loops of small bowel with index loop of small bowel within the left mid hemiabdomen measuring 3.6 cm in diameter. Moderate colonic stool burden. No supine evidence of pneumoperitoneum. No pneumatosis or portal venous gas. Presumed calcified fibroid overlies the left hemipelvis. Stable position of support apparatus. No acute osseus  abnormalities. IMPRESSION: No change to slight improvement in suspected small-bowel obstruction. Electronically Signed   By: Simonne Come M.D.   On: 05/09/2018 07:47   Dg Abd Portable 1v  Result Date: 05/08/2018 CLINICAL DATA:  Follow up small bowel obstruction EXAM: PORTABLE ABDOMEN - 1 VIEW COMPARISON:  05/06/2018 FINDINGS: Scattered large and small bowel gas is noted. Multiple dilated loops of small bowel are again identified and stable in appearance. Nasogastric catheter is again noted within the stomach. No free air is seen. Degenerative changes  of the lumbar spine are noted. IMPRESSION: Persistent small bowel dilatation. No new focal abnormality is noted. Electronically Signed   By: Alcide Clever M.D.   On: 05/08/2018 08:36   Dg Abd Portable 1v  Result Date: 05/06/2018 CLINICAL DATA:  Small bowel obstruction EXAM: PORTABLE ABDOMEN - 1 VIEW COMPARISON:  Portable exam at 0628 hours compared to 05/05/2018 FINDINGS: Stool scattered throughout colon. Persistent dilatation of small bowel loops in the LEFT mid abdomen up to 4.9 cm diameter. No bowel wall thickening. Bones demineralized. No definite renal calcifications. IMPRESSION: Persistent small bowel dilatation consistent with small bowel obstruction. Little interval change. Electronically Signed   By: Ulyses Southward M.D.   On: 05/06/2018 10:21   Dg Abd Portable 1v-small Bowel Obstruction Protocol-initial, 8 Hr Delay  Result Date: 05/04/2018 CLINICAL DATA:  Small bowel obstruction protocol. 8 hour delay. EXAM: PORTABLE ABDOMEN - 1 VIEW COMPARISON:  05/03/2018 FINDINGS: Gaseous distention of left upper quadrant small bowel. Enteric tube with tip projected over the mid abdomen consistent with location in the upper stomach. Contrast material is demonstrated in the dilated small bowel. No contrast material is identified in the colon. This is suggestive of high-grade obstruction. Degenerative changes in the spine. IMPRESSION: Contrast material is demonstrated  in the dilated small bowel but not in the colon consistent with high-grade obstruction. Electronically Signed   By: Burman Nieves M.D.   On: 05/04/2018 01:55   Dg Abd Portable 1v-small Bowel Obstruction Protocol-initial, 8 Hr Delay  Result Date: 05/01/2018 CLINICAL DATA:  Small bowel obstruction. EXAM: PORTABLE ABDOMEN - 1 VIEW COMPARISON:  Radiograph earlier this day at 1109 hour, CT yesterday. FINDINGS: Enteric tube in place with tip in the stomach, side-port just beyond the gastroesophageal junction. Improving small bowel dilatation in the left abdomen. Moderate stool in the right and transverse colon. No evidence of free air. Pelvic calcification may be stone in the urinary bladder or exophytic prostate calcification. IMPRESSION: Improving small bowel dilatation in the left abdomen since radiograph earlier this day. Electronically Signed   By: Narda Rutherford M.D.   On: 05/01/2018 21:14   Dg Abd Portable 1v-small Bowel Protocol-position Verification  Result Date: 05/01/2018 CLINICAL DATA:  Nasogastric tube placement EXAM: PORTABLE ABDOMEN - 1 VIEW COMPARISON:  Portable exam 1109 hours compared to 04/30/2018 FINDINGS: Tip of nasogastric tube projects over mid stomach. Dilated small bowel loops are seen in the LEFT mid abdomen. Increased stool in RIGHT colon. Lung bases appear emphysematous with subsegmental atelectasis at LEFT base. Bones demineralized. IMPRESSION: Tip of nasogastric tube projects over mid stomach. Small bowel dilatation in LEFT mid abdomen with increased stool in RIGHT colon. Electronically Signed   By: Ulyses Southward M.D.   On: 05/01/2018 11:22   Dg Abd Portable 1v  Result Date: 04/30/2018 CLINICAL DATA:  67 year old male with vomiting. EXAM: PORTABLE ABDOMEN - 1 VIEW COMPARISON:  04/29/2018 abdominal radiographs. FINDINGS: Portable AP semi upright and supine views at 0847 hours. Enteric tube has been placed and the stomach now appears decompressed. NG tube side hole at the gastric  body. Non obstructed bowel gas pattern. Moderate volume of retained stool redemonstrated in the transverse colon and distal sigmoid/rectum. Negative visible lung bases. No pneumoperitoneum. Stable abdominal and pelvic visceral contours. Dystrophic calcification in the pelvis. Stable pelvic catheter, likely a Foley. No acute osseous abnormality identified. IMPRESSION: 1. NG tube in place with resolved gastric distention. 2. Non obstructed bowel gas pattern with moderate volume of retained stool. Electronically Signed   By: Rexene Edison  Margo Aye M.D.   On: 04/30/2018 09:06   Dg Abd Portable 1v  Result Date: 04/29/2018 CLINICAL DATA:  Nausea and vomiting EXAM: PORTABLE ABDOMEN - 1 VIEW COMPARISON:  None. FINDINGS: Gaseous distention of the stomach. Formed stool throughout the colon. No concerning mass effect or calcification. IMPRESSION: Prominent gaseous distension of the stomach. Constipated appearance. Electronically Signed   By: Marnee Spring M.D.   On: 04/29/2018 14:44   [place] Ekg Site Rite  Result Date: 05/04/2018 If Site Rite image not attached, placement could not be confirmed due to current cardiac rhythm.    LOS: 21 days   Jeoffrey Massed, MD  [facility]  If 7PM-7AM, please contact night-coverage  Please page via [email or web address] [email or web address]-click on MD name and type text message  05/20/2018, 1:53 PM

## 2018-05-20 NOTE — Progress Notes (Signed)
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 Sister repeatedly said "you're just nurse" and that she is filling paperwork against me.

## 2018-05-20 NOTE — Progress Notes (Signed)
Occupational Therapy Treatment Patient Details Name: AYYAN SITES MRN: [id number] DOB: Nov 28, 1950 Today's Date: 05/20/2018    History of present illness Pt is a 67 y/o male admitted secondary to AMS and Sepsis most likely secondary to UTI. CT negative for acute abnormality. Work up during hospitalizations shows, aspiration PNA, SBO, acute metabolic encephalopathy, anemia, AKI, severe malnutrition, deconditioning/debility, and hyponatremia.  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  PMH inlcudes BI, dementia, HTN, and schizophrenia.    OT comments  Pt. Able to complete grooming task bed level.  Mostly communicating through yes/no head movements but did make some verbalizations at end of session.    Follow Up Recommendations  SNF;Supervision/Assistance - 24 hour    Equipment Recommendations       Recommendations for Other Services      Precautions / Restrictions Precautions Precautions: Fall       Mobility Bed Mobility                  Transfers                      Balance                                           ADL either performed or assessed with clinical judgement   ADL Overall ADL's : Needs assistance/impaired     Grooming: Wash/dry face;Bed level Grooming Details (indicate cue type and reason): encouragement to engage in task                               General ADL Comments: pt. with limited participation. did verbally engage at end of session. pt. had washclothes under his chin (were in place upon my arrival) and after washing his face he states "i dont want those on me" took x3 to fully understand him but i was able to remove the washclothes. he then states "im wet" in regards to having just washed his face.  asissted with drying his face. left one dry wash cloth by his chin to dab if needed  offered lunch items to pt. and he cont. to shake head no.  reviewed each item and he cont. to decline. rn made aware he did not want lunch     Vision       Perception     Praxis      Cognition Arousal/Alertness: Awake/alert Behavior During Therapy: WFL for tasks assessed/performed                                            Exercises     Shoulder Instructions       General Comments      Pertinent Vitals/ Pain       Pain Assessment: No/denies pain  Home Living                                          Prior Functioning/Environment              Frequency  Min 2X/week  Progress Toward Goals  OT Goals(current goals can now be found in the care plan section)  Progress towards OT goals: Progressing toward goals     Plan Discharge plan remains appropriate    Co-evaluation                 AM-PAC PT "6 Clicks" Daily Activity     Outcome Measure   Help from another person eating meals?: Total Help from another person taking care of personal grooming?: A Little Help from another person toileting, which includes using toliet, bedpan, or urinal?: A Lot Help from another person bathing (including washing, rinsing, drying)?: A Lot Help from another person to put on and taking off regular upper body clothing?: A Lot Help from another person to put on and taking off regular lower body clothing?: A Lot 6 Click Score: 12    End of Session    OT Visit Diagnosis: Muscle weakness (generalized) (M62.81);Other symptoms and signs involving cognitive function   Activity Tolerance Patient tolerated treatment well   Patient Left in bed;with call bell/phone within reach;with bed alarm set   Nurse Communication Other (comment)(reviewed with RN that lunch items were offered x2 and pt. declined all items)        Time: 1610-9604 OT Time Calculation (min): 10 min  Charges: OT General Charges $OT Visit: 1 Visit OT Treatments $Self Care/Home Management : 8-22 mins  Robet Leu , COTA/L 05/20/2018, 1:55 PM

## 2018-05-20 NOTE — Progress Notes (Signed)
APS worker Ms. Kyla Balzarine [phone]) contacted CSW. She will fax a release form so that CSW can send her info regarding patient.   [name] Sunshine Mackowski LCSW [phone]

## 2018-05-20 NOTE — Progress Notes (Signed)
Patient ID: Jeffrey Davila, male   DOB: 03/29/1951, 67 y.o.   MRN: [id number]    10 Days Post-Op  Subjective: Patient says "yes" to everything.  Unreliable info.  Report that patient had emesis overnight.  Objective: Vital signs in last 24 hours: Temp:  [98.7 F (37.1 C)-99.2 F (37.3 C)] 99 F (37.2 C) (10/25 0528) Pulse Rate:  [89-108] 89 (10/25 0528) Resp:  [17-18] 17 (10/25 0528) BP: (107-122)/(64-77) 122/75 (10/25 0528) SpO2:  [100 %] 100 % (10/25 0528) Last BM Date: (none since surgery per pt's sister)  Intake/Output from previous day: 10/24 0701 - 10/25 0700 In: 2309.8 [I.V.:1172.9; IV Piggyback:1137] Out: 1000 [Urine:1000] Intake/Output this shift: No intake/output data recorded.  PE: Abd: soft, seems nontender, ND, great BS, incisions c/d/i with staples.  Lab Results:  No results for input(s): WBC, HGB, HCT, PLT in the last 72 hours. BMET Recent Labs    05/19/18 0408 05/20/18 0402  NA 137 137  K 4.3 4.5  CL 107 105  CO2 23 25  GLUCOSE 90 107*  BUN 22 25*  CREATININE 0.66 0.70  CALCIUM 8.8* 9.0   PT/INR No results for input(s): LABPROT, INR in the last 72 hours. CMP     Component Value Date/Time   NA 137 05/20/2018 0402   K 4.5 05/20/2018 0402   CL 105 05/20/2018 0402   CO2 25 05/20/2018 0402   GLUCOSE 107 (H) 05/20/2018 0402   BUN 25 (H) 05/20/2018 0402   CREATININE 0.70 05/20/2018 0402   CALCIUM 9.0 05/20/2018 0402   PROT 6.6 05/20/2018 0402   ALBUMIN 2.5 (L) 05/20/2018 0402   AST 12 (L) 05/20/2018 0402   ALT 14 05/20/2018 0402   ALKPHOS 134 (H) 05/20/2018 0402   BILITOT 0.3 05/20/2018 0402   GFRNONAA >60 05/20/2018 0402   GFRAA >60 05/20/2018 0402   Lipase     Component Value Date/Time   LIPASE 204 (H) 04/29/2018 0904       Studies/Results: No results found.  Anti-infectives: Anti-infectives (From admission, onward)   Start     Dose/Rate Route Frequency Ordered Stop   05/10/18 0941  ceFAZolin (ANCEF) 2-4 GM/100ML-% IVPB      Note to Pharmacy:  [name]   : cabinet override      05/10/18 0941 05/10/18 2144   05/01/18 1215  metroNIDAZOLE (FLAGYL) IVPB 500 mg  Status:  Discontinued     500 mg 100 mL/hr over 60 Minutes Intravenous Every 8 hours 05/01/18 1201 05/06/18 1153   04/30/18 1100  cefTRIAXone (ROCEPHIN) 2 g in sodium chloride 0.9 % 100 mL IVPB  Status:  Discontinued     2 g 200 mL/hr over 30 Minutes Intravenous Every 24 hours 04/29/18 1416 05/06/18 1153   04/29/18 1430  cefTRIAXone (ROCEPHIN) 1 g in sodium chloride 0.9 % 100 mL IVPB     1 g 200 mL/hr over 30 Minutes Intravenous Every 24 hours 04/29/18 1416 04/30/18 1429   04/29/18 1130  cefTRIAXone (ROCEPHIN) 1 g in sodium chloride 0.9 % 100 mL IVPB     1 g 200 mL/hr over 30 Minutes Intravenous  Once 04/29/18 1118 04/29/18 1251       Assessment/Plan Hx of prior lung surgery Acute metabolic encephalopathy Anemia- transfused 10/14 - 6.9/21 today Dr. 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 Jerral Ralph, patient had emesis overnight report by RN.  Abdominal exam is benign without fevers.  Will order scan given 10 days post op to rule out complications.  Patient may just have gastroparesis, atony of bowels given his overall bedbound limited mobility state. -mobilize as able. Mostly bed bound, but would like to get him up to a chair if possible. -cont TNA for nutritional support at this time, once tolerating more to eat, will start to wean -scheduled tylenol, tramadol prn for pain   ID -rocephin 10/4>>10/11, flagyl  10/6>>10/11 FEN -IVF,clear liquids, TNA VTE -SCDs, SQ heparin Foley -condom cath   LOS: 21 days    Letha Cape , [facility] 05/20/2018, 9:56 AM Pager: [phone]

## 2018-05-21 ENCOUNTER — Inpatient Hospital Stay (HOSPITAL_COMMUNITY): Payer: Medicare Other

## 2018-05-21 LAB — COMPREHENSIVE METABOLIC PANEL
ALBUMIN: 2.5 g/dL — AB (ref 3.5–5.0)
ALT: 11 U/L (ref 0–44)
AST: 10 U/L — AB (ref 15–41)
Alkaline Phosphatase: 129 U/L — ABNORMAL HIGH (ref 38–126)
Anion gap: 7 (ref 5–15)
BUN: 25 mg/dL — AB (ref 8–23)
CHLORIDE: 105 mmol/L (ref 98–111)
CO2: 24 mmol/L (ref 22–32)
CREATININE: 0.69 mg/dL (ref 0.61–1.24)
Calcium: 9.3 mg/dL (ref 8.9–10.3)
GFR calc Af Amer: >60 mL/min (ref >60)
GFR calc non Af Amer: >60 mL/min (ref >60)
GLUCOSE: 108 mg/dL — AB (ref 70–99)
POTASSIUM: 4.7 mmol/L (ref 3.5–5.1)
Sodium: 136 mmol/L (ref 135–145)
Total Bilirubin: 0.2 mg/dL — ABNORMAL LOW (ref 0.3–1.2)
Total Protein: 6.7 g/dL (ref 6.5–8.1)

## 2018-05-21 LAB — CBC
HCT: 30.3 % — ABNORMAL LOW (ref 39.0–52.0)
Hemoglobin: 9.3 g/dL — ABNORMAL LOW (ref 13.0–17.0)
MCH: 28.5 pg (ref 26.0–34.0)
MCHC: 30.7 g/dL (ref 30.0–36.0)
MCV: 92.9 fL (ref 80.0–100.0)
Platelets: 579 10*3/uL — ABNORMAL HIGH (ref 150–400)
RBC: 3.26 MIL/uL — AB (ref 4.22–5.81)
RDW: 16.6 % — ABNORMAL HIGH (ref 11.5–15.5)
WBC: 12.4 10*3/uL — ABNORMAL HIGH (ref 4.0–10.5)
nRBC: 0 % (ref 0.0–0.2)

## 2018-05-21 LAB — GLUCOSE, CAPILLARY
GLUCOSE-CAPILLARY: 103 mg/dL — AB (ref 70–99)
GLUCOSE-CAPILLARY: 90 mg/dL (ref 70–99)
GLUCOSE-CAPILLARY: 97 mg/dL (ref 70–99)

## 2018-05-21 LAB — MAGNESIUM: MAGNESIUM: 2.2 mg/dL (ref 1.7–2.4)

## 2018-05-21 MED ORDER — TRAVASOL 10 % IV SOLN
INTRAVENOUS | Status: DC
  Administered 2018-05-21: 18:00:00 via INTRAVENOUS
  Filled 2018-05-21: qty 936

## 2018-05-21 NOTE — Progress Notes (Signed)
Spoke with Surgery PA Marisue Ivan. Pt has PO medications to be given. Gave verbal order may clamp NG tube for 1 hour to give necessary PO meds. Hold miralax at this time. Verbal order placed.

## 2018-05-21 NOTE — Progress Notes (Signed)
PHARMACY - ADULT TOTAL PARENTERAL NUTRITION CONSULT NOTE   Pharmacy Consult for TPN Indication: SBO  Patient Measurements: Height: 5\' 6"  (167.6 cm) Weight: 110 lb (49.9 kg) IBW/kg (Calculated) : 63.8 TPN AdjBW (KG): 49.9 Body mass index is 17.75 kg/m.  Assessment:  40 YOM admitted on 10/4 with sepsis. He was found to have pneumonia and a possible SBO on CT on 10/5. Pt has a history of SBO s/p colectomy in 1993 (history (in the 1990s) of trauma causing small bowel injury requiring laparotomy). An NG tube was placed with improving small bowel dilatation but patient had 3-4 episodes of vomiting overnight on 10/7-10/8 and the NG tube had to be replaced due to incorrect placement. Pharmacy now consulted to manage TPN due to worsening SBO and concern for adhesions that require surgery.  Actual body is significantly below ideal body weight.  Of note, patient is extremely cachectic. Patient hx unreliable due to mental status.  GI: Pre-albumin down to 15.7. S/p ex-lap with LOA, SBR and anastomosis on 10/15. NGT not clamped as ordered 10/22, clamped 10/23 and allowing some clears, going slow per Surgery. +BS, no BM recorded. Xray w/ colonic "ileus". Emesis x 1 overnight  10/25 - Persistent SBO via Xray Endo: No hx DM - CBGs controlled, SSI d/c'd 10/14 Lytes: WNL, (Goal K>/=4 and Mg>2/= with ileus) Renal: SCr stable WNL, UOP 0.8 cc/kg/hr, LR at [facility] Pulm: RA Cards: VSS. Lasix IV x 1 on 10/20 Hepatobil: LFTs / Tbili / TG wnl, Lipase elevated 10/4  TG 91 > 67 Neuro: Schizophrenia. Dementia. Benztropine (held since 10/20), Zyprexa bid, IV valproate ID: s/p abx for Klebsiella UTI and aspiration PNA. Afebrile, WBC WNL Heme: SQ heparin. Plts stable. Transfuse 10/20 > Hg low stable  TPN Access: double lumen PICC placed 10/10 TPN start date: 05/05/18  Nutritional Goals (per RD rec on 10/21): 1550-1750kCal, 85-100 gm protein, >1.5 L fluid per day  Current Nutrition:  TPN Clear liquid diet started  10/24 - no intake documented  Plan:  Continue TPN at 65 mL/hr, providing 94 g of protein, 218 g of dextrose, and 55 g of lipids for a total of 1,663 kCals per day, meeting 100% of patient needs Electrolytes in TPN: Continue, Cl:Ac ratio 1:1 Add MVI, trace elements, Pepcid 40mg  to TPN Continue to monitor CBGs and adjust as needed Continue LR at 10 ml/hr per MD F/U TPN labs in AM, plans for advancing diet / weaning TPN   Nadara Mustard, PharmD., MS Clinical Pharmacist Pager:  [phone] Thank you for allowing pharmacy to be part of this patients care team. 05/21/2018, 9:39 AM

## 2018-05-21 NOTE — Progress Notes (Signed)
PROGRESS NOTE        PATIENT DETAILS Name: Jeffrey Davila Age: 67 y.o. Sex: male Date of Birth: 02-Sep-1950 Admit Date: 04/29/2018 Admitting Physician Starleen Arms, MD PCP:Uba, Reuel Boom, MD  Brief Narrative: Patient is a 67 y.o. male with history of schizophrenia/?  Cognitive dysfunction (dementia), hypertension, BPH, remote history (in the 1990s) of trauma causing small bowel injury requiring laparotomy-mostly wheelchair-bound-presented to the hospital with approximately 1 week history of intermittent vomiting, confusion for a few days prior to this hospital admission-presented with sepsis secondary to UTI, vomiting and significant constipation.  Patient was started on IV antibiotics and admitted to the hospital service,initial imaging was suggestive of constipation causing gastric distention-NG tube was placed, however upon further evaluation with a CT scan he was found  to have a small bowel obstruction.  General surgery was consulted, even with n.p.o. status/NG tube decompression-he continues to have SBO-patient's sister has refused to sign consent for surgery and wanted patient to receive several days of TNA before she consented to surgery.  Subsequently underwent laparotomy with lysis of adhesions on 10/15, postoperative course complicated by ileus and low-grade fever.  See below for further details.  Subjective:  Seen in bed appears to be in no distress but remains to be an unreliable historian he claims he is passing flatus and had bowel movements but these are not recorded, he denies any headache or chest pain but states his abdomen is hurting all over.  Denies any nausea.   Assessment/Plan:  Small bowel obstruction: Underwent laparotomy with lysis of adhesions 05/10/2018 - postoperative course complicated by ileus and likely repeat obstruction as per CT scan done on 05/20/2018 with a left lower quadrant transition point, complicated by lack of activity and  baseline wheelchair/bedbound status, no surgery monitoring and following will defer management of this problem to general surgery.  [place] continue bowel rest, NG tube to low intermittent suction along with IV TNA and monitor, minimize narcotic use, monitor electrolytes.  Encouraged to sit up in the chair and increase activity.  Low-grade fever on 10/19: No foci of infection apparent-could have been from atelectasis-not on any antimicrobial therapy.  Blood culture on 10/20 neg so far, chest x-ray on 10/20 neg for PNA.  Remains afebrile since then.  Follow.  Sepsis secondary to complicated UTI: Sepsis pathophysiology has resolved, urine culture positive for Klebsiella-has completed a course of IV Rocephin.  Blood cultures remain negative.    Aspiration pneumonia: This occurred in the early part of his hospitalization-felt to be secondary to o SBO/vomiting with underlying frailty.  Has completed a course of antimicrobial therapy.  Continue to mobilize as much as possible.    Acute metabolic encephalopathy: Improved and back to his usual baseline.  Encephalopathy was present on initial presentation-this was felt secondary to UTI, and developing SBO.Marland Kitchen  Anemia: Suspect anemia secondary to acute illness, and IV fluid dilution.  Has been transfused a total of 3 units so far, last transfusion on 10/20.  Follow CBC periodically.  No indication of any overt blood loss at this time.   AKI: Hemodynamically mediated, resolved.  Schizophrenia: Appears stable-continue IV Depakote and olanzapine.    Hypertension: Blood pressure appears stable-continue to hold antihypertensives.   Severe malnutrition: Continue TNA-we will start low-dose nutritional supplements when diet is more stable.    Deconditioning/debility: Suspect has significant amount of debility at  baseline-he is very cachectic-deconditioning/debility has worsened due to acute illness/SBO.  PT following.  Other issues:  this MD is familiar with this  patient-I took care of during the initial part of his hospital stay.  Patient's sister initially wanted guarantees with surgery, wanted transfer to [facility] (refused by [facility]).  See my prior notes.  I have reviewed notes by Dr. Marena Chancy issues with patient's sister refusing NG tube placement, wanting transfer to [facility] ([facility]).  Per nursing staff-she has on occasion been very disruptive to their workflow and in the care of the patient.  I completely agree with Dr. Marena Chancy concerns whether patient's sister is the best person to be making decisions for this patient-given numerous documentations by nursing staff and by prior MDs.  Social work, risk Insurance account manager, Camera operator and palliative care following.    DVT Prophylaxis:  Prophylactic Heparin   Code Status:  Full code   Family Communication:  Spoke with sister at bedside this afternoon-explained CT scan findings.  Nursing director present.  Please  see my note from 05/17/2018.   Disposition Plan: Remain inpatient-ultimately to SNF when he is more stable.  Antimicrobial agents: Anti-infectives (From admission, onward)   Start     Dose/Rate Route Frequency Ordered Stop   05/10/18 0941  ceFAZolin (ANCEF) 2-4 GM/100ML-% IVPB    Note to Pharmacy:  [name]   : cabinet override      05/10/18 0941 05/10/18 2144   05/01/18 1215  metroNIDAZOLE (FLAGYL) IVPB 500 mg  Status:  Discontinued     500 mg 100 mL/hr over 60 Minutes Intravenous Every 8 hours 05/01/18 1201 05/06/18 1153   04/30/18 1100  cefTRIAXone (ROCEPHIN) 2 g in sodium chloride 0.9 % 100 mL IVPB  Status:  Discontinued     2 g 200 mL/hr over 30 Minutes Intravenous Every 24 hours 04/29/18 1416 05/06/18 1153   04/29/18 1430  cefTRIAXone (ROCEPHIN) 1 g in sodium chloride 0.9 % 100 mL IVPB     1 g 200 mL/hr over 30 Minutes Intravenous Every 24 hours 04/29/18 1416 04/30/18 1429   04/29/18 1130  cefTRIAXone (ROCEPHIN) 1  g in sodium chloride 0.9 % 100 mL IVPB     1 g 200 mL/hr over 30 Minutes Intravenous  Once 04/29/18 1118 04/29/18 1251      Procedures: None  CONSULTS:  None  Time spent: 25 minutes   MEDICATIONS: Scheduled Meds: . acetaminophen  1,000 mg Oral Q6H  . benztropine  1 mg Oral BID  . Chlorhexidine Gluconate Cloth  6 each Topical 2 times per day on Tue  . heparin  5,000 Units Subcutaneous Q8H  . lip balm  1 application Topical BID  . OLANZapine zydis  10 mg Oral BID  . polyethylene glycol  17 g Oral Daily  . polyvinyl alcohol  1 drop Both Eyes TID   Continuous Infusions: . sodium chloride 1,000 mL (05/14/18 1850)  . lactated ringers 10 mL/hr at 05/11/18 0617  . TPN ADULT (ION)    . valproate sodium 250 mg (05/21/18 1000)   PRN Meds:.sodium chloride, albuterol, alum & mag hydroxide-simeth, bisacodyl, diphenhydrAMINE, magic mouthwash, menthol-cetylpyridinium, metoprolol tartrate, ondansetron (ZOFRAN) IV, phenol, sodium chloride flush, traMADol   PHYSICAL EXAM: Vital signs: Vitals:   05/20/18 0528 05/20/18 1406 05/20/18 2156 05/21/18 0511  BP: 122/75 118/65 93/63 102/70  Pulse: 89 91 87 89  Resp: 17 16 16 16   Temp: 99 F (37.2 C) 99.1 F (37.3 C) 99 F (  37.2 C) 99.1 F (37.3 C)  TempSrc: Oral Oral    SpO2: 100% 100% 99% 99%  Weight:      Height:       Filed Weights   04/29/18 0842  Weight: 49.9 kg   Body mass index is 17.75 kg/m.   Exam  Awake in no distress, follows basic commands,  [place].AT,PERRAL, NG to low intermittent suction in place Supple Neck,No JVD, No cervical lymphadenopathy appriciated.  Symmetrical Chest wall movement, Good air movement bilaterally, CTAB RRR,No Gallops, Rubs or new Murmurs, No Parasternal Heave Hypoactive but+ve B.Sounds, Abd Soft, No tenderness, No organomegaly appriciated, No rebound - guarding or rigidity. No Cyanosis, Clubbing or edema, No new Rash or bruise    I have personally reviewed following labs and imaging  studies  LABORATORY DATA: CBC: Recent Labs  Lab 05/15/18 0419 05/15/18 1430 05/16/18 0401 05/21/18 0306  WBC 8.0  --  8.3 12.4*  NEUTROABS  --   --  5.6  --   HGB 6.9* 9.1* 8.6* 9.3*  HCT 21.8* 28.7* 27.5* 30.3*  MCV 95.2  --  90.8 92.9  PLT 586*  --  608* 579*    Basic Metabolic Panel: Recent Labs  Lab 05/16/18 0401 05/17/18 0412 05/18/18 0441 05/19/18 0408 05/20/18 0402 05/21/18 0306  NA 138 136 137 137 137 136  K 4.0 4.1 4.2 4.3 4.5 4.7  CL 110 107 107 107 105 105  CO2 24 23 23 23 25 24   GLUCOSE 99 100* 104* 90 107* 108*  BUN 21 22 23 22  25* 25*  CREATININE 0.62 0.61 0.61 0.66 0.70 0.69  CALCIUM 8.5* 8.7* 8.6* 8.8* 9.0 9.3  MG 2.0 2.1 2.0 1.9 2.1 2.2  PHOS 3.6 3.5 3.9 3.5  --   --     GFR: Estimated Creatinine Clearance: 64.1 mL/min (by C-G formula based on SCr of 0.69 mg/dL).  Liver Function Tests: Recent Labs  Lab 05/17/18 0412 05/18/18 0441 05/19/18 0408 05/20/18 0402 05/21/18 0306  AST 14* 12* 17 12* 10*  ALT 18 16 15 14 11   ALKPHOS 168* 149* 148* 134* 129*  BILITOT 0.4 0.4 0.2* 0.3 0.2*  PROT 6.1* 6.2* 6.3* 6.6 6.7  ALBUMIN 2.1* 2.2* 2.3* 2.5* 2.5*   No results for input(s): LIPASE, AMYLASE in the last 168 hours. No results for input(s): AMMONIA in the last 168 hours.  Coagulation Profile: No results for input(s): INR, PROTIME in the last 168 hours.  Cardiac Enzymes: No results for input(s): CKTOTAL, CKMB, CKMBINDEX, TROPONINI in the last 168 hours.  BNP (last 3 results) No results for input(s): PROBNP in the last 8760 hours.  HbA1C: No results for input(s): HGBA1C in the last 72 hours.  CBG: Recent Labs  Lab 05/19/18 1715 05/20/18 0203 05/20/18 1858 05/21/18 0004 05/21/18 0749  GLUCAP 106* 100* 100* 103* 97    Lipid Profile: No results for input(s): CHOL, HDL, LDLCALC, TRIG, CHOLHDL, LDLDIRECT in the last 72 hours.  Thyroid Function Tests: No results for input(s): TSH, T4TOTAL, FREET4, T3FREE, THYROIDAB in the last 72  hours.  Anemia Panel: No results for input(s): VITAMINB12, FOLATE, FERRITIN, TIBC, IRON, RETICCTPCT in the last 72 hours.  Urine analysis:    Component Value Date/Time   COLORURINE YELLOW 05/15/2018 1109   APPEARANCEUR CLEAR 05/15/2018 1109   LABSPEC 1.013 05/15/2018 1109   PHURINE 7.0 05/15/2018 1109   GLUCOSEU NEGATIVE 05/15/2018 1109   HGBUR SMALL (A) 05/15/2018 1109   BILIRUBINUR NEGATIVE 05/15/2018 1109   KETONESUR NEGATIVE 05/15/2018  1109   PROTEINUR NEGATIVE 05/15/2018 1109   UROBILINOGEN 1.0 06/08/2010 1523   NITRITE NEGATIVE 05/15/2018 1109   LEUKOCYTESUR TRACE (A) 05/15/2018 1109    Sepsis Labs: Lactic Acid, Venous    Component Value Date/Time   LATICACIDVEN 1.07 04/29/2018 1334    MICROBIOLOGY: Recent Results (from the past 240 hour(s))  Culture, blood (routine x 2)     Status: None   Collection Time: 05/15/18  3:15 PM  Result Value Ref Range Status   Specimen Description BLOOD SITE NOT SPECIFIED  Final   Special Requests   Final    BOTTLES DRAWN AEROBIC ONLY Blood Culture results may not be optimal due to an inadequate volume of blood received in culture bottles   Culture   Final    NO GROWTH 5 DAYS Performed at [facility], [address], [place], [place] [address]    Report Status 05/20/2018 FINAL  Final  Culture, blood (routine x 2)     Status: None   Collection Time: 05/15/18  3:22 PM  Result Value Ref Range Status   Specimen Description BLOOD SITE NOT SPECIFIED  Final   Special Requests   Final    BOTTLES DRAWN AEROBIC ONLY Blood Culture results may not be optimal due to an inadequate volume of blood received in culture bottles   Culture   Final    NO GROWTH 5 DAYS Performed at [facility], [address], [place], [place] [address]    Report Status 05/20/2018 FINAL  Final    RADIOLOGY STUDIES/RESULTS: Ct Abdomen Pelvis Wo Contrast  Result Date: 04/30/2018 CLINICAL DATA:  Nausea, bilious vomiting EXAM: CT ABDOMEN AND PELVIS  WITHOUT CONTRAST TECHNIQUE: Multidetector CT imaging of the abdomen and pelvis was performed following the standard protocol without IV contrast. Sagittal and coronal MPR images reconstructed from axial data set. Oral contrast was not administered. COMPARISON:  None FINDINGS: Lower chest: Emphysematous changes with BILATERAL lower lobe infiltrates consistent with either pneumonia or aspiration. Hepatobiliary: Liver unremarkable. Gallbladder not well visualized due to streak artifacts, grossly unremarkable. Pancreas: Normal appearance Spleen: Normal appearance Adrenals/Urinary Tract: Question adrenal thickening bilaterally. No obvious renal mass or hydronephrosis. Ureters not visualized. Foley catheter decompresses urinary bladder. Stomach/Bowel: Nasogastric tube in stomach. Low-attenuation fluid distends stomach and multiple proximal small bowel loops, with small bowel loops up to 4.1 cm diameter. Distal small bowel loops and colon are decompressed. Scattered stool throughout colon. Findings likely represent mid small bowel obstruction. Appendix not visualized. Suspected rectal wall thickening. Vascular/Lymphatic: Atherosclerotic calcification aorta. Aorta normal caliber. Reproductive: Minimal prostatic enlargement and scattered prostatic calcifications. Other: No definite free air or free fluid. Nonspecific stranding of presacral fat. Musculoskeletal: No acute osseous findings. IMPRESSION: Dilated proximal and decompressed distal small bowel loops compatible with small bowel obstruction, likely in the mid small bowel. Due to lack of fat planes, streak artifacts, and lack of contrast, unable to localize site of obstruction. No evidence of perforation/free air. Question rectal wall thickening, recommend correlation with proctoscopy. Emphysematous changes with bibasilar pulmonary infiltrates either representing pneumonia or aspiration. Electronically Signed   By: Ulyses Southward M.D.   On: 04/30/2018 19:38   Dg Abd 1  View  Result Date: 05/20/2018 CLINICAL DATA:  NG tube adjustment EXAM: ABDOMEN - 1 VIEW COMPARISON:  05/20/2018 FINDINGS: Esophageal tube tip and side-port project over gastric fundus, somewhat abrupt appearing curvature at the distal tip with persistent gaseous dilatation. Continued marked gaseous enlargement of bowel in the left abdomen up to  5 cm. Moderate stool in the colon. Residual contrast within the bladder. IMPRESSION: 1. Esophageal tube tip and side port overlie the gastric fundus, somewhat abrupt curvature of the tip, could be due to mild kinking. 2. Persistent dilatation of stomach and left abdominal small bowel consistent with a bowel obstruction. Electronically Signed   By: Jasmine Pang M.D.   On: 05/20/2018 18:37   Dg Abd 1 View  Result Date: 05/20/2018 CLINICAL DATA:  NG tube placement. EXAM: ABDOMEN - 1 VIEW COMPARISON:  CT abdomen pelvis from same day. FINDINGS: Tip of the NG tube in the stomach with the proximal side port at the gastroesophageal junction. Dilated loops of small bowel in the left abdomen are unchanged. Contrast is seen within the renal collecting systems and bladder. No acute osseous abnormality. IMPRESSION: 1. NG tube tip in the stomach with the proximal side port at the gastroesophageal junction. Consider advancing 2-3 cm. 2. Unchanged small bowel obstruction. Electronically Signed   By: Obie Dredge M.D.   On: 05/20/2018 16:47   Dg Abd 1 View  Result Date: 05/16/2018 CLINICAL DATA:  Feeding tube advanced today. EXAM: ABDOMEN - 1 VIEW COMPARISON:  Plain films of the abdomen dated 05/15/2018 and 05/14/2018. FINDINGS: Enteric tube is coiled in the stomach with tip at the level of the gastric fundus/cardia. Distended gas-filled loops of large and small bowel are again appreciated throughout the abdomen and upper pelvis. IMPRESSION: 1. Feeding tube is coiled in the stomach with tip at the level of the gastric fundus/cardia. 2. Persistent gaseous distention of both  large and small bowel loops throughout the abdomen and pelvis, compatible with previous reports of obstruction or ileus. Electronically Signed   By: Bary Richard M.D.   On: 05/16/2018 20:26   Dg Abd 1 View  Result Date: 05/03/2018 CLINICAL DATA:  Small bowel obstruction EXAM: ABDOMEN - 1 VIEW COMPARISON:  05/02/2018 FINDINGS: NG tube is in the stomach. Gaseous distention of the stomach and left abdominal small bowel loops. Gas and stool within nondistended colon. No free air or organomegaly. IMPRESSION: Gaseous distention of stomach and left abdominal small bowel loops likely reflecting small bowel obstruction. NG tube is in the stomach. Electronically Signed   By: Charlett Nose M.D.   On: 05/03/2018 10:05   Dg Abd 1 View  Result Date: 05/02/2018 CLINICAL DATA:  Small bowel obstruction EXAM: ABDOMEN - 1 VIEW COMPARISON:  May 01, 2018 abdominal radiograph and CT abdomen and pelvis April 30, 2018 FINDINGS: Nasogastric tube no longer present. There is slightly less bowel dilatation in the left upper quadrant compared to 1 day prior. There is moderate air in the stomach. No bowel dilatation elsewhere. No air-fluid levels. No free air. There is moderate stool in the colon. There is atelectatic change in the right lung base. IMPRESSION: Nasogastric tube no longer appreciable. Less small bowel dilatation in the left upper quadrant. Question resolving bowel obstruction. No free air evident. Electronically Signed   By: Bretta Bang III M.D.   On: 05/02/2018 14:20   Ct Head Wo Contrast  Result Date: 04/29/2018 CLINICAL DATA:  Altered level of consciousness increased over last 2 days, 2 episodes of nausea and vomiting, history of prior brain injury, dementia, former smoker, hypertension EXAM: CT HEAD WITHOUT CONTRAST TECHNIQUE: Contiguous axial images were obtained from the base of the skull through the vertex without intravenous contrast. COMPARISON:  06/08/2010 FINDINGS: Brain: Generalized atrophy.  Normal ventricular morphology. No midline shift or mass effect. Small vessel chronic ischemic  changes of deep cerebral white matter. 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 No intracranial hemorrhage, mass lesion, evidence of acute infarction, or extra-axial fluid collection. Vascular: Minimal atherosclerotic calcification of internal carotid arteries at skull base Skull: Intact Sinuses/Orbits: Clear Other: N/A IMPRESSION: Atrophy with small vessel chronic ischemic changes of deep cerebral white matter. 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 No acute intracranial abnormalities. Electronically Signed   By: Ulyses Southward M.D.   On: 04/29/2018 09:58   Ct Abdomen Pelvis W Contrast  Result Date: 05/20/2018 CLINICAL DATA:  Nausea and vomiting. 10 days status post laparoscopic lysis of adhesions and bowel resection. EXAM: CT ABDOMEN AND PELVIS WITH CONTRAST TECHNIQUE: Multidetector CT imaging of the abdomen and pelvis was performed using the standard protocol following bolus administration of intravenous contrast. CONTRAST:  ISOVUE-300 IOPAMIDOL (ISOVUE-300) INJECTION 61% COMPARISON:  04/30/2018 FINDINGS: Lower Chest: Decreased airspace disease seen in both lower lobes, likely due to resolving pneumonia. Hepatobiliary: A few tiny left hepatic lobe cysts are seen. No hepatic masses identified. Tiny calcified gallstones or layering sludge noted, however there is no evidence of cholecystitis or biliary ductal dilatation. Pancreas:  No mass or inflammatory changes. Spleen: Within normal limits in size and appearance. Adrenals/Urinary Tract: No masses identified. No evidence of hydronephrosis. Stomach/Bowel: Small amount of free air is seen, likely postoperative in etiology. No abscess identified. Multiple moderately dilated fluid-filled small bowel loops are seen in the left abdomen, with transition to nondilated small bowel in the left lower quadrant in area of surgical staples. This is  consistent with a small-bowel obstruction. No mass or inflammatory process identified. Moderate amount of stool again seen in the right colon. Vascular/Lymphatic: No pathologically enlarged lymph nodes. No abdominal aortic aneurysm. Reproductive: Mild-to-moderate enlargement of prostate gland shows no significant change. Other: Subcutaneous emphysema is seen in the left anterior abdominal wall, likely postoperative in etiology. Musculoskeletal:  No suspicious bone lesions identified. IMPRESSION: Small bowel obstruction, with transition point in left lower quadrant. This may be due to adhesion, as no obstructing mass or inflammatory process identified. Small amount of postop intraperitoneal air and subcutaneous emphysema. No evidence of abscess. Cholelithiasis.  No radiographic evidence of cholecystitis. Decreased bilateral lower lobe airspace disease, consistent with resolving pneumonia or inflammatory process. Electronically Signed   By: Myles Rosenthal M.D.   On: 05/20/2018 13:41   Dg Chest Port 1 View  Result Date: 05/15/2018 CLINICAL DATA:  Shortness of breath. Patient 5 days postop bowel resection with lysis of adhesions. EXAM: PORTABLE CHEST 1 VIEW COMPARISON:  05/11/2018 FINDINGS: Right-sided PICC line with tip over the SVC. Lungs are adequately inflated with subtle left retrocardiac opacification which may be due to vascular crowding or atelectasis and less likely infection. Remainder of the lungs are clear. Cardiomediastinal silhouette is within normal. Persistent evidence of free peritoneal air compatible with patient's recent surgery. IMPRESSION: Mild left base opacification likely atelectasis or vascular crowding and less likely infection. Persistent free peritoneal air compatible recent postop state. Right-sided PICC line with tip over the SVC. Electronically Signed   By: Elberta Fortis M.D.   On: 05/15/2018 09:38   Dg Chest Port 1 View  Result Date: 05/11/2018 CLINICAL DATA:  Shortness of breath,  hypertension, pneumonia, dementia, former smoker EXAM: PORTABLE CHEST 1 VIEW COMPARISON:  Portable exam 0945 hours compared to 05/09/2018 FINDINGS: Nasogastric tube extends into stomach. RIGHT arm PICC line tip projects over SVC. Normal heart size and pulmonary vascularity. Elongation of thoracic aorta. Lungs appear emphysematous  but clear. No pleural effusion or pneumothorax. Bones demineralized. Free air identified under the hemidiaphragms bilaterally; EHR indicates patient underwent a laparoscopic procedure on 05/10/2018, small-bowel resection and lysis of adhesions for small-bowel obstruction prior operative note. IMPRESSION: COPD changes without infiltrate. Free air consistent with abdominal surgery 1 day ago. Electronically Signed   By: Ulyses Southward M.D.   On: 05/11/2018 10:21   Dg Chest Port 1 View  Result Date: 05/09/2018 CLINICAL DATA:  Cough EXAM: PORTABLE CHEST 1 VIEW COMPARISON:  05/04/2018 FINDINGS: NG tube is stable. Right upper extremity PICC placed. Tip is at the cavoatrial junction. Lungs remain hyperaerated and clear. Small right pleural effusion is stable. No pneumothorax. Aorta remains somewhat prominent due to tortuosity and rotation of the thorax. IMPRESSION: New right upper extremity PICC with its tip at the cavoatrial junction. No active cardiopulmonary disease. Electronically Signed   By: Jolaine Click M.D.   On: 05/09/2018 07:37   Dg Chest Port 1 View  Result Date: 04/29/2018 CLINICAL DATA:  Altered mental status EXAM: PORTABLE CHEST 1 VIEW COMPARISON:  April 12, 2018 FINDINGS: There is mild scarring in the lateral right base and in the medial right lower lung region. There is no edema or consolidation. The heart size and pulmonary vascularity are normal. No adenopathy. There is a recent appearing fracture of the posterior right seventh rib with alignment essentially anatomic. An older fracture of the posterior right eighth rib is noted. No pneumothorax. IMPRESSION: Recent  appearing fracture posterior right seventh rib. Older fracture posterior right eighth rib noted. Areas of mild scarring noted on the right. No edema or consolidation. Stable cardiac silhouette. Electronically Signed   By: Bretta Bang III M.D.   On: 04/29/2018 08:46   Dg Chest Port 1v Same Day  Result Date: 05/01/2018 CLINICAL DATA:  history of schizophrenia, hypertension, BPH-mostly wheelchair-bound-presented to the hospital with approximately 1 week history of intermittent vomiting, confusion for a few days prior to this hospital admission-presented with sepsis secondary to UTI, and significant constipation. C/o SOB EXAM: PORTABLE CHEST - 1 VIEW SAME DAY COMPARISON:  04/29/2018 FINDINGS: Lungs are hyperinflated. Subsegmental atelectasis or early infiltrate at the left lung base, new since previous. Right lung clear. Heart size normal.  Tortuous ectatic thoracic aorta. No effusion. No pneumothorax. Right anterolateral fourth rib fracture, age indeterminate. Nasogastric tube extends to the stomach. IMPRESSION: 1. New patchy infiltrate or subsegmental atelectasis at the left lung base. 2. Right fourth rib fracture without pneumothorax. 3. Nasogastric tube placement to the stomach. Electronically Signed   By: Corlis Leak M.D.   On: 05/01/2018 08:24   Dg Abd 2 Views  Result Date: 05/05/2018 CLINICAL DATA:  Vomiting.  History of right lumpectomy. EXAM: ABDOMEN - 2 VIEW COMPARISON:  05/04/2018 FINDINGS: Improve dilation of small bowel loops with residual borderline gas-filled distended small bowel loops in the left upper quadrant of the abdomen. Normal colonic bowel gas pattern. No evidence of free intra-abdominal gas. Normal cardiac silhouette. Tortuosity of the thoracic aorta. Right pleural reflection versus small pleural effusion. No lobar airspace consolidation. Enteric catheter tip within the expected location the gastric body, side hole at the GE junction level. Right PICC line terminates in the  expected location of the cavoatrial junction. IMPRESSION: Improved appearance of small-bowel obstruction. Enteric catheter with side hole at the level of the GE junction. Advancement may be considered. Possible small right pleural effusion. Electronically Signed   By: Ted Mcalpine M.D.   On: 05/05/2018 12:51   Dg Abd  Acute W/chest  Result Date: 05/04/2018 CLINICAL DATA:  Follow-up small bowel obstruction. EXAM: DG ABDOMEN ACUTE W/ 1V CHEST COMPARISON:  Abdominal x-rays 05/04/1999 19 dating back to 04/29/2018. CT abdomen and pelvis 04/30/2018. Chest x-rays 05/01/2018, 04/12/2018. FINDINGS: Nasogastric tube tip in the fundus of the stomach. Persistent marked gaseous distension of the jejunum in the LEFT UPPER QUADRANT, slowly progressive over the past 3 days, with air-fluid levels on the LATERAL decubitus image. No evidence of free intraperitoneal air. Normal caliber colon with moderate stool burden. Calcified uterine fibroid in the pelvic midline. Cardiac silhouette normal in size, unchanged. Thoracic aorta tortuous and atherosclerotic. Hilar and mediastinal contours otherwise unremarkable. Interval improvement in aeration in the LEFT lung base, with only minimal patchy opacities persisting. Lungs otherwise clear. Emphysematous changes in both lungs with scarring in the RIGHT mid lung, unchanged. Pleuroparenchymal scarring at the RIGHT lung base which accounts for the blunting of the costophrenic angle, unchanged dating back to the original 04/12/2018 examination. IMPRESSION: 1. Persistent partial small bowel obstruction which has slowly worsened over the past 3 days. 2. No evidence of free intraperitoneal air. 3. Improved aeration in the LEFT LOWER LOBE, with only mild atelectasis and/or pneumonia persisting. Electronically Signed   By: Hulan Saas M.D.   On: 05/04/2018 09:28   Dg Abd Portable 1v  Result Date: 05/17/2018 CLINICAL DATA:  Small-bowel obstruction. EXAM: PORTABLE ABDOMEN - 1 VIEW  COMPARISON:  05/16/2018.  05/15/2018.  CT 04/30/2018. FINDINGS: Surgical staples noted over the abdomen. NG tube noted with its tip in the stomach. Distended loops of small and large bowel are again noted. Similar findings noted on prior exam. No free air. Degenerative change in osteopenia lumbar spine and both hips. Prostate calcifications are noted. Aortoiliac atherosclerotic vascular calcification. Diffuse osteopenia degenerative changes lumbar spine and both hips. Mild blunting of the right costophrenic angle again noted suggesting scarring and/or tiny effusion. IMPRESSION: NG tube noted with its tip in the stomach. Dilated loops of small and large bowel are again noted without significant interim change. No free air identified. Electronically Signed   By: Maisie Fus  Register   On: 05/17/2018 07:32   Dg Abd Portable 1v  Result Date: 05/15/2018 CLINICAL DATA:  NG tube placement EXAM: PORTABLE ABDOMEN - 1 VIEW COMPARISON:  05/15/2018 FINDINGS: NG tube is in placed into the fundus of the stomach. Continued gaseous distention of visualized upper abdominal bowel loops. IMPRESSION: NG tube in the fundus of the stomach Electronically Signed   By: Charlett Nose M.D.   On: 05/15/2018 20:11   Dg Abd Portable 1v  Result Date: 05/15/2018 CLINICAL DATA:  Small bowel obstruction. EXAM: PORTABLE ABDOMEN - 1 VIEW COMPARISON:  One-view abdomen 05/14/2018 FINDINGS: Patient's retained left.  Heart size normal.  Lung bases are clear. Extend loops of large and small bowel are stable. There is no free air or pneumatosis. Surgical clips are noted. IMPRESSION: 1. Similar appearance of distended large and small bowel consistent with obstruction or ileus. Electronically Signed   By: Marin Roberts M.D.   On: 05/15/2018 08:10   Dg Abd Portable 1v  Result Date: 05/14/2018 CLINICAL DATA:  Abdominal pain EXAM: PORTABLE ABDOMEN - 1 VIEW COMPARISON:  05/13/2018 FINDINGS: Interval NG tube removal. Some interval improvement in  gaseous distention of the bowel. Stool and air throughout the transverse colon. Postop staples again noted. Air in the rectum. No significant interval change. Degenerative changes of the spine. Basilar chronic interstitial changes and atelectasis. IMPRESSION: NG tube removed. Stable nonspecific bowel  gas pattern, suspect resolving obstruction or ileus. Little interval change. Electronically Signed   By: Judie Petit.  Shick M.D.   On: 05/14/2018 10:23   Dg Abd Portable 1v  Result Date: 05/13/2018 CLINICAL DATA:  Abdominal pain, check NG placement EXAM: PORTABLE ABDOMEN - 1 VIEW COMPARISON:  05/09/2018 FINDINGS: Nasogastric catheter is noted with the tip in the stomach. Proximal side port lies in the distal esophagus. This is stable from the prior exam. Scattered large and small bowel gas is noted. Postsurgical changes are now seen. Mild small bowel dilatation is noted which may be related to a postoperative ileus. IMPRESSION: Mild postoperative ileus. Nasogastric catheter as described. Electronically Signed   By: Alcide Clever M.D.   On: 05/13/2018 09:56   Dg Abd Portable 1v  Result Date: 05/09/2018 CLINICAL DATA:  Small-bowel obstruction EXAM: PORTABLE ABDOMEN - 1 VIEW COMPARISON:  05/08/2018; 05/06/2018; 05/05/2018 FINDINGS: Re-demonstrated gaseous distention of several loops of small bowel with index loop of small bowel within the left mid hemiabdomen measuring 3.6 cm in diameter. Moderate colonic stool burden. No supine evidence of pneumoperitoneum. No pneumatosis or portal venous gas. Presumed calcified fibroid overlies the left hemipelvis. Stable position of support apparatus. No acute osseus abnormalities. IMPRESSION: No change to slight improvement in suspected small-bowel obstruction. Electronically Signed   By: Simonne Come M.D.   On: 05/09/2018 07:47   Dg Abd Portable 1v  Result Date: 05/08/2018 CLINICAL DATA:  Follow up small bowel obstruction EXAM: PORTABLE ABDOMEN - 1 VIEW COMPARISON:  05/06/2018  FINDINGS: Scattered large and small bowel gas is noted. Multiple dilated loops of small bowel are again identified and stable in appearance. Nasogastric catheter is again noted within the stomach. No free air is seen. Degenerative changes of the lumbar spine are noted. IMPRESSION: Persistent small bowel dilatation. No new focal abnormality is noted. Electronically Signed   By: Alcide Clever M.D.   On: 05/08/2018 08:36   Dg Abd Portable 1v  Result Date: 05/06/2018 CLINICAL DATA:  Small bowel obstruction EXAM: PORTABLE ABDOMEN - 1 VIEW COMPARISON:  Portable exam at 0628 hours compared to 05/05/2018 FINDINGS: Stool scattered throughout colon. Persistent dilatation of small bowel loops in the LEFT mid abdomen up to 4.9 cm diameter. No bowel wall thickening. Bones demineralized. No definite renal calcifications. IMPRESSION: Persistent small bowel dilatation consistent with small bowel obstruction. Little interval change. Electronically Signed   By: Ulyses Southward M.D.   On: 05/06/2018 10:21   Dg Abd Portable 1v-small Bowel Obstruction Protocol-initial, 8 Hr Delay  Result Date: 05/04/2018 CLINICAL DATA:  Small bowel obstruction protocol. 8 hour delay. EXAM: PORTABLE ABDOMEN - 1 VIEW COMPARISON:  05/03/2018 FINDINGS: Gaseous distention of left upper quadrant small bowel. Enteric tube with tip projected over the mid abdomen consistent with location in the upper stomach. Contrast material is demonstrated in the dilated small bowel. No contrast material is identified in the colon. This is suggestive of high-grade obstruction. Degenerative changes in the spine. IMPRESSION: Contrast material is demonstrated in the dilated small bowel but not in the colon consistent with high-grade obstruction. Electronically Signed   By: Burman Nieves M.D.   On: 05/04/2018 01:55   Dg Abd Portable 1v-small Bowel Obstruction Protocol-initial, 8 Hr Delay  Result Date: 05/01/2018 CLINICAL DATA:  Small bowel obstruction. EXAM: PORTABLE  ABDOMEN - 1 VIEW COMPARISON:  Radiograph earlier this day at 1109 hour, CT yesterday. FINDINGS: Enteric tube in place with tip in the stomach, side-port just beyond the gastroesophageal junction. Improving small bowel dilatation  in the left abdomen. Moderate stool in the right and transverse colon. No evidence of free air. Pelvic calcification may be stone in the urinary bladder or exophytic prostate calcification. IMPRESSION: Improving small bowel dilatation in the left abdomen since radiograph earlier this day. Electronically Signed   By: Narda Rutherford M.D.   On: 05/01/2018 21:14   Dg Abd Portable 1v-small Bowel Protocol-position Verification  Result Date: 05/01/2018 CLINICAL DATA:  Nasogastric tube placement EXAM: PORTABLE ABDOMEN - 1 VIEW COMPARISON:  Portable exam 1109 hours compared to 04/30/2018 FINDINGS: Tip of nasogastric tube projects over mid stomach. Dilated small bowel loops are seen in the LEFT mid abdomen. Increased stool in RIGHT colon. Lung bases appear emphysematous with subsegmental atelectasis at LEFT base. Bones demineralized. IMPRESSION: Tip of nasogastric tube projects over mid stomach. Small bowel dilatation in LEFT mid abdomen with increased stool in RIGHT colon. Electronically Signed   By: Ulyses Southward M.D.   On: 05/01/2018 11:22   Dg Abd Portable 1v  Result Date: 04/30/2018 CLINICAL DATA:  67 year old male with vomiting. EXAM: PORTABLE ABDOMEN - 1 VIEW COMPARISON:  04/29/2018 abdominal radiographs. FINDINGS: Portable AP semi upright and supine views at 0847 hours. Enteric tube has been placed and the stomach now appears decompressed. NG tube side hole at the gastric body. Non obstructed bowel gas pattern. Moderate volume of retained stool redemonstrated in the transverse colon and distal sigmoid/rectum. Negative visible lung bases. No pneumoperitoneum. Stable abdominal and pelvic visceral contours. Dystrophic calcification in the pelvis. Stable pelvic catheter, likely a Foley. No  acute osseous abnormality identified. IMPRESSION: 1. NG tube in place with resolved gastric distention. 2. Non obstructed bowel gas pattern with moderate volume of retained stool. Electronically Signed   By: Odessa Fleming M.D.   On: 04/30/2018 09:06   Dg Abd Portable 1v  Result Date: 04/29/2018 CLINICAL DATA:  Nausea and vomiting EXAM: PORTABLE ABDOMEN - 1 VIEW COMPARISON:  None. FINDINGS: Gaseous distention of the stomach. Formed stool throughout the colon. No concerning mass effect or calcification. IMPRESSION: Prominent gaseous distension of the stomach. Constipated appearance. Electronically Signed   By: Marnee Spring M.D.   On: 04/29/2018 14:44   [place] Ekg Site Rite  Result Date: 05/04/2018 If Site Rite image not attached, placement could not be confirmed due to current cardiac rhythm.    LOS: 22 days   Signature  Susa Raring M.D on 05/21/2018 at 11:44 AM   -  To page go to [email or web address] - password [facility]

## 2018-05-21 NOTE — Progress Notes (Signed)
[facility] Progress Note  11 Days Post-Op  Subjective: CC:  C/o some "stomach pain" but not able to describe or rate pain.   Objective: Vital signs in last 24 hours: Temp:  [99 F (37.2 C)-99.1 F (37.3 C)] 99.1 F (37.3 C) (10/26 0511) Pulse Rate:  [87-91] 89 (10/26 0511) Resp:  [16] 16 (10/26 0511) BP: (93-118)/(63-70) 102/70 (10/26 0511) SpO2:  [99 %-100 %] 99 % (10/26 0511) Last BM Date: (none since surgery per pt's sister)  Intake/Output from previous day: 10/25 0701 - 10/26 0700 In: 1843.5 [I.V.:1638.5; IV Piggyback:205] Out: 1400 [Urine:300; Emesis/NG output:1100] Intake/Output this shift: No intake/output data recorded.  PE: Gen:  Alert, NAD HEENT: NG tube in place to LIWS Pulm:  Normal effort Abd: Soft, thin, mild tenderness over left mid abdomen, staples clean and dry  NG - 1,100 mL/24h, bilious Skin: warm and dry, no rashes  Psych: A&Ox3   Lab Results:  Recent Labs    05/21/18 0306  WBC 12.4*  HGB 9.3*  HCT 30.3*  PLT 579*   BMET Recent Labs    05/20/18 0402 05/21/18 0306  NA 137 136  K 4.5 4.7  CL 105 105  CO2 25 24  GLUCOSE 107* 108*  BUN 25* 25*  CREATININE 0.70 0.69  CALCIUM 9.0 9.3   PT/INR No results for input(s): LABPROT, INR in the last 72 hours. CMP     Component Value Date/Time   NA 136 05/21/2018 0306   K 4.7 05/21/2018 0306   CL 105 05/21/2018 0306   CO2 24 05/21/2018 0306   GLUCOSE 108 (H) 05/21/2018 0306   BUN 25 (H) 05/21/2018 0306   CREATININE 0.69 05/21/2018 0306   CALCIUM 9.3 05/21/2018 0306   PROT 6.7 05/21/2018 0306   ALBUMIN 2.5 (L) 05/21/2018 0306   AST 10 (L) 05/21/2018 0306   ALT 11 05/21/2018 0306   ALKPHOS 129 (H) 05/21/2018 0306   BILITOT 0.2 (L) 05/21/2018 0306   GFRNONAA >60 05/21/2018 0306   GFRAA >60 05/21/2018 0306   Lipase     Component Value Date/Time   LIPASE 204 (H) 04/29/2018 0904       Studies/Results: Dg Abd 1 View  Result Date: 05/20/2018 CLINICAL DATA:  NG  tube adjustment EXAM: ABDOMEN - 1 VIEW COMPARISON:  05/20/2018 FINDINGS: Esophageal tube tip and side-port project over gastric fundus, somewhat abrupt appearing curvature at the distal tip with persistent gaseous dilatation. Continued marked gaseous enlargement of bowel in the left abdomen up to 5 cm. Moderate stool in the colon. Residual contrast within the bladder. IMPRESSION: 1. Esophageal tube tip and side port overlie the gastric fundus, somewhat abrupt curvature of the tip, could be due to mild kinking. 2. Persistent dilatation of stomach and left abdominal small bowel consistent with a bowel obstruction. Electronically Signed   By: Jasmine Pang M.D.   On: 05/20/2018 18:37   Dg Abd 1 View  Result Date: 05/20/2018 CLINICAL DATA:  NG tube placement. EXAM: ABDOMEN - 1 VIEW COMPARISON:  CT abdomen pelvis from same day. FINDINGS: Tip of the NG tube in the stomach with the proximal side port at the gastroesophageal junction. Dilated loops of small bowel in the left abdomen are unchanged. Contrast is seen within the renal collecting systems and bladder. No acute osseous abnormality. IMPRESSION: 1. NG tube tip in the stomach with the proximal side port at the gastroesophageal junction. Consider advancing 2-3 cm. 2. Unchanged small bowel obstruction. Electronically Signed   By: Teresita Madura  Derry M.D.   On: 05/20/2018 16:47   Ct Abdomen Pelvis W Contrast  Result Date: 05/20/2018 CLINICAL DATA:  Nausea and vomiting. 10 days status post laparoscopic lysis of adhesions and bowel resection. EXAM: CT ABDOMEN AND PELVIS WITH CONTRAST TECHNIQUE: Multidetector CT imaging of the abdomen and pelvis was performed using the standard protocol following bolus administration of intravenous contrast. CONTRAST:  ISOVUE-300 IOPAMIDOL (ISOVUE-300) INJECTION 61% COMPARISON:  04/30/2018 FINDINGS: Lower Chest: Decreased airspace disease seen in both lower lobes, likely due to resolving pneumonia. Hepatobiliary: A few tiny  left hepatic lobe cysts are seen. No hepatic masses identified. Tiny calcified gallstones or layering sludge noted, however there is no evidence of cholecystitis or biliary ductal dilatation. Pancreas:  No mass or inflammatory changes. Spleen: Within normal limits in size and appearance. Adrenals/Urinary Tract: No masses identified. No evidence of hydronephrosis. Stomach/Bowel: Small amount of free air is seen, likely postoperative in etiology. No abscess identified. Multiple moderately dilated fluid-filled small bowel loops are seen in the left abdomen, with transition to nondilated small bowel in the left lower quadrant in area of surgical staples. This is consistent with a small-bowel obstruction. No mass or inflammatory process identified. Moderate amount of stool again seen in the right colon. Vascular/Lymphatic: No pathologically enlarged lymph nodes. No abdominal aortic aneurysm. Reproductive: Mild-to-moderate enlargement of prostate gland shows no significant change. Other: Subcutaneous emphysema is seen in the left anterior abdominal wall, likely postoperative in etiology. Musculoskeletal:  No suspicious bone lesions identified. IMPRESSION: Small bowel obstruction, with transition point in left lower quadrant. This may be due to adhesion, as no obstructing mass or inflammatory process identified. Small amount of postop intraperitoneal air and subcutaneous emphysema. No evidence of abscess. Cholelithiasis.  No radiographic evidence of cholecystitis. Decreased bilateral lower lobe airspace disease, consistent with resolving pneumonia or inflammatory process. Electronically Signed   By: Myles Rosenthal M.D.   On: 05/20/2018 13:41    Anti-infectives: Anti-infectives (From admission, onward)   Start     Dose/Rate Route Frequency Ordered Stop   05/10/18 0941  ceFAZolin (ANCEF) 2-4 GM/100ML-% IVPB    Note to Pharmacy:  [name]   : cabinet override      05/10/18 0941 05/10/18 2144   05/01/18 1215   metroNIDAZOLE (FLAGYL) IVPB 500 mg  Status:  Discontinued     500 mg 100 mL/hr over 60 Minutes Intravenous Every 8 hours 05/01/18 1201 05/06/18 1153   04/30/18 1100  cefTRIAXone (ROCEPHIN) 2 g in sodium chloride 0.9 % 100 mL IVPB  Status:  Discontinued     2 g 200 mL/hr over 30 Minutes Intravenous Every 24 hours 04/29/18 1416 05/06/18 1153   04/29/18 1430  cefTRIAXone (ROCEPHIN) 1 g in sodium chloride 0.9 % 100 mL IVPB     1 g 200 mL/hr over 30 Minutes Intravenous Every 24 hours 04/29/18 1416 04/30/18 1429   04/29/18 1130  cefTRIAXone (ROCEPHIN) 1 g in sodium chloride 0.9 % 100 mL IVPB     1 g 200 mL/hr over 30 Minutes Intravenous  Once 04/29/18 1118 04/29/18 1251       Assessment/Plan Hx of prior lung surgery Acute metabolic encephalopathy Anemia- transfused 10/14 - 6.9/21 today Dr. 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 Lindie Spruce  -mobilize as able. Mostly bed bound, but would like to get him up to a chair if possible. -cont TNA for nutritional support at this time, once tolerating more to eat, will start to wean  ID -rocephin 10/4>>10/11, flagyl 10/6>>10/11; WBC 12 today, follow  FEN -IVF,clear liquids, TNA VTE -SCDs, SQ heparin Foley -condom cath  LOS: 22 days    Hosie Spangle, [facility] Pager: [phone]

## 2018-05-22 LAB — COMPREHENSIVE METABOLIC PANEL
ALK PHOS: 127 U/L — AB (ref 38–126)
ALT: 10 U/L (ref 0–44)
AST: 10 U/L — ABNORMAL LOW (ref 15–41)
Albumin: 2.6 g/dL — ABNORMAL LOW (ref 3.5–5.0)
Anion gap: 12 (ref 5–15)
BILIRUBIN TOTAL: 0.2 mg/dL — AB (ref 0.3–1.2)
BUN: 33 mg/dL — ABNORMAL HIGH (ref 8–23)
CALCIUM: 9.7 mg/dL (ref 8.9–10.3)
CO2: 24 mmol/L (ref 22–32)
Chloride: 102 mmol/L (ref 98–111)
Creatinine, Ser: 0.78 mg/dL (ref 0.61–1.24)
GFR calc non Af Amer: >60 mL/min (ref >60)
GLUCOSE: 99 mg/dL (ref 70–99)
Potassium: 4.9 mmol/L (ref 3.5–5.1)
Sodium: 138 mmol/L (ref 135–145)
TOTAL PROTEIN: 6.9 g/dL (ref 6.5–8.1)

## 2018-05-22 LAB — CBC
HEMATOCRIT: 30.8 % — AB (ref 39.0–52.0)
HEMOGLOBIN: 9.5 g/dL — AB (ref 13.0–17.0)
MCH: 28.6 pg (ref 26.0–34.0)
MCHC: 30.8 g/dL (ref 30.0–36.0)
MCV: 92.8 fL (ref 80.0–100.0)
NRBC: 0 % (ref 0.0–0.2)
Platelets: 546 10*3/uL — ABNORMAL HIGH (ref 150–400)
RBC: 3.32 MIL/uL — AB (ref 4.22–5.81)
RDW: 16.6 % — ABNORMAL HIGH (ref 11.5–15.5)
WBC: 12.1 10*3/uL — AB (ref 4.0–10.5)

## 2018-05-22 LAB — MAGNESIUM: Magnesium: 2.3 mg/dL (ref 1.7–2.4)

## 2018-05-22 LAB — GLUCOSE, CAPILLARY
GLUCOSE-CAPILLARY: 106 mg/dL — AB (ref 70–99)
GLUCOSE-CAPILLARY: 89 mg/dL (ref 70–99)
GLUCOSE-CAPILLARY: 98 mg/dL (ref 70–99)
Glucose-Capillary: 108 mg/dL — ABNORMAL HIGH (ref 70–99)
Glucose-Capillary: 104 mg/dL — ABNORMAL HIGH (ref 70–99)

## 2018-05-22 MED ORDER — ACETAMINOPHEN 10 MG/ML IV SOLN
1000.0000 mg | Freq: Four times a day (QID) | INTRAVENOUS | Status: AC
  Administered 2018-05-22 (×3): 1000 mg via INTRAVENOUS
  Filled 2018-05-22 (×4): qty 100

## 2018-05-22 MED ORDER — TRAVASOL 10 % IV SOLN
INTRAVENOUS | Status: AC
  Administered 2018-05-22: 17:00:00 via INTRAVENOUS
  Filled 2018-05-22: qty 936

## 2018-05-22 NOTE — Progress Notes (Signed)
Pt's sister at bedside. Asking to speak with MD. Educated sister that MD will be around to see pt this morning. Sister asking for a time. Educated sister that RN is not aware of a time and MD will be by when he is able per his usual. Sister verbalizes understanding.

## 2018-05-22 NOTE — Progress Notes (Signed)
PROGRESS NOTE        PATIENT DETAILS Name: Jeffrey Davila Age: 67 y.o. Sex: male Date of Birth: [birth year] Admit Date: 04/29/2018 Admitting Physician Starleen Arms, MD PCP:Uba, Reuel Boom, MD  Brief Narrative: Patient is a 67 y.o. male with history of schizophrenia/?  Cognitive dysfunction (dementia), hypertension, BPH, remote history (in the 1990s) of trauma causing small bowel injury requiring laparotomy-mostly wheelchair-bound-presented to the hospital with approximately 1 week history of intermittent vomiting, confusion for a few days prior to this hospital admission-presented with sepsis secondary to UTI, vomiting and significant constipation.  Patient was started on IV antibiotics and admitted to the hospital service,initial imaging was suggestive of constipation causing gastric distention-NG tube was placed, however upon further evaluation with a CT scan he was found  to have a small bowel obstruction.  General surgery was consulted, even with n.p.o. status/NG tube decompression-he continues to have SBO-patient's sister has refused to sign consent for surgery and wanted patient to receive several days of TNA before she consented to surgery.  Subsequently underwent laparotomy with lysis of adhesions on 10/15, postoperative course complicated by ileus and low-grade fever.  See below for further details.  Subjective:  Patient in bed appears to be in no distress, overall unreliable historian, complains of some abdominal pain, no nausea this morning.   Assessment/Plan:  Small bowel obstruction: Underwent laparotomy with lysis of adhesions 05/10/2018 - postoperative course complicated by ileus and likely repeat obstruction as per CT scan done on 05/20/2018 with a left lower quadrant transition point, complicated by lack of activity and baseline wheelchair/bedbound status, no surgery monitoring and following will defer management of this problem to general surgery.     Currently being managed conservatively with bowel rest, still has significant return with NG tube on 05/22/2018 Case discussed with surgery PA on 05/22/2018 they will be following and managing this problem, continue with IV TNA and monitor, minimize narcotic use, monitor electrolytes.  Encouraged to sit up in the chair and increase activity.  Low-grade fever on 10/19: No foci of infection apparent-could have been from atelectasis-not on any antimicrobial therapy.  Blood culture on 10/20 neg so far, chest x-ray on 10/20 neg for PNA.  Remains afebrile since then.  Follow.  Sepsis secondary to complicated UTI: Sepsis pathophysiology has resolved, urine culture positive for Klebsiella-has completed a course of IV Rocephin.  Blood cultures remain negative.    Aspiration pneumonia: This occurred in the early part of his hospitalization-felt to be secondary to o SBO/vomiting with underlying frailty.  Has completed a course of antimicrobial therapy.  Continue to mobilize as much as possible.    Acute metabolic encephalopathy: Improved and back to his usual baseline.  Encephalopathy was present on initial presentation-this was felt secondary to UTI, and developing SBO.Marland Kitchen  Anemia: Suspect anemia secondary to acute illness, and IV fluid dilution.  Has been transfused a total of 3 units so far, last transfusion on 10/20.  Follow CBC periodically.  No indication of any overt blood loss at this time.   AKI: Hemodynamically mediated, resolved.  Schizophrenia: Appears stable-continue IV Depakote and olanzapine.    Hypertension: Blood pressure appears stable-continue to hold antihypertensives.   Severe malnutrition: Continue TNA-we will start low-dose nutritional supplements when diet is more stable.    Deconditioning/debility: Suspect has significant amount of debility at baseline-he is very cachectic-deconditioning/debility has worsened  due to acute illness/SBO.  PT following.  Other issues:  this MD is  familiar with this patient-I took care of during the initial part of his hospital stay.  Patient's sister initially wanted guarantees with surgery, wanted transfer to [facility] (refused by [facility]).  See my prior notes.  I have reviewed notes by Dr. Marena Chancy issues with patient's sister refusing NG tube placement, wanting transfer to [facility] ([facility]  Per nursing staff-she has on occasion been very disruptive to their workflow and in the care of the patient.  I completely agree with Dr. Marena Chancy concerns whether patient's sister is the best person to be making decisions for this patient-given numerous documentations by nursing staff and by prior MDs.  Social work, risk Insurance account manager, Camera operator and palliative care following.    DVT Prophylaxis:  Prophylactic Heparin   Code Status:  Full code   Family Communication:  Sister bedside on 05/22/2018 with nursing staff present  Please  see my note from 05/17/2018.   Disposition Plan: Remain inpatient-ultimately to SNF when he is more stable.  Antimicrobial agents: Anti-infectives (From admission, onward)   Start     Dose/Rate Route Frequency Ordered Stop   05/10/18 0941  ceFAZolin (ANCEF) 2-4 GM/100ML-% IVPB    Note to Pharmacy:  [name]   : cabinet override      05/10/18 0941 05/10/18 2144   05/01/18 1215  metroNIDAZOLE (FLAGYL) IVPB 500 mg  Status:  Discontinued     500 mg 100 mL/hr over 60 Minutes Intravenous Every 8 hours 05/01/18 1201 05/06/18 1153   04/30/18 1100  cefTRIAXone (ROCEPHIN) 2 g in sodium chloride 0.9 % 100 mL IVPB  Status:  Discontinued     2 g 200 mL/hr over 30 Minutes Intravenous Every 24 hours 04/29/18 1416 05/06/18 1153   04/29/18 1430  cefTRIAXone (ROCEPHIN) 1 g in sodium chloride 0.9 % 100 mL IVPB     1 g 200 mL/hr over 30 Minutes Intravenous Every 24 hours 04/29/18 1416 04/30/18 1429   04/29/18 1130  cefTRIAXone (ROCEPHIN) 1 g in sodium chloride 0.9  % 100 mL IVPB     1 g 200 mL/hr over 30 Minutes Intravenous  Once 04/29/18 1118 04/29/18 1251      Procedures: None  CONSULTS:  None  Time spent: 25 minutes   MEDICATIONS: Scheduled Meds: . benztropine  1 mg Oral BID  . Chlorhexidine Gluconate Cloth  6 each Topical 2 times per day on Tue  . heparin  5,000 Units Subcutaneous Q8H  . lip balm  1 application Topical BID  . OLANZapine zydis  10 mg Oral BID  . polyethylene glycol  17 g Oral Daily  . polyvinyl alcohol  1 drop Both Eyes TID   Continuous Infusions: . sodium chloride 1,000 mL (05/14/18 1850)  . acetaminophen    . lactated ringers 10 mL/hr at 05/11/18 0617  . TPN ADULT (ION)    . valproate sodium 250 mg (05/22/18 0836)   PRN Meds:.sodium chloride, albuterol, alum & mag hydroxide-simeth, bisacodyl, diphenhydrAMINE, magic mouthwash, menthol-cetylpyridinium, metoprolol tartrate, ondansetron (ZOFRAN) IV, phenol, sodium chloride flush, traMADol   PHYSICAL EXAM: Vital signs: Vitals:   05/21/18 0511 05/21/18 1630 05/21/18 2138 05/22/18 0643  BP: 102/70 96/66 91/64  94/68  Pulse: 89 88 94 85  Resp: 16 16 16 18   Temp: 99.1 F (37.3 C) 99.8 F (37.7 C) 99.3 F (37.4 C) 98.8 F (37.1 C)  TempSrc:  Oral Oral Oral  SpO2: 99%  99% 97% 100%  Weight:      Height:       Filed Weights   04/29/18 0842  Weight: 49.9 kg   Body mass index is 17.75 kg/m.   Exam  Awake he is to be in no distress, NG tube in place, condom catheter in place [place].AT,PERRAL Supple Neck,No JVD, No cervical lymphadenopathy appriciated.  Symmetrical Chest wall movement, Good air movement bilaterally, CTAB RRR,No Gallops, Rubs or new Murmurs, No Parasternal Heave +ve B.Sounds, Abd Soft, No tenderness, No organomegaly appriciated, No rebound - guarding or rigidity. No Cyanosis, Clubbing or edema, No new Rash or bruise  I have personally reviewed following labs and imaging studies  LABORATORY DATA: CBC: Recent Labs  Lab 05/15/18 1430  05/16/18 0401 05/21/18 0306 05/22/18 0400  WBC  --  8.3 12.4* 12.1*  NEUTROABS  --  5.6  --   --   HGB 9.1* 8.6* 9.3* 9.5*  HCT 28.7* 27.5* 30.3* 30.8*  MCV  --  90.8 92.9 92.8  PLT  --  608* 579* 546*    Basic Metabolic Panel: Recent Labs  Lab 05/16/18 0401 05/17/18 0412 05/18/18 0441 05/19/18 0408 05/20/18 0402 05/21/18 0306 05/22/18 0400  NA 138 136 137 137 137 136 138  K 4.0 4.1 4.2 4.3 4.5 4.7 4.9  CL 110 107 107 107 105 105 102  CO2 24 23 23 23 25 24 24   GLUCOSE 99 100* 104* 90 107* 108* 99  BUN 21 22 23 22  25* 25* 33*  CREATININE 0.62 0.61 0.61 0.66 0.70 0.69 0.78  CALCIUM 8.5* 8.7* 8.6* 8.8* 9.0 9.3 9.7  MG 2.0 2.1 2.0 1.9 2.1 2.2 2.3  PHOS 3.6 3.5 3.9 3.5  --   --   --     GFR: Estimated Creatinine Clearance: 64.1 mL/min (by C-G formula based on SCr of 0.78 mg/dL).  Liver Function Tests: Recent Labs  Lab 05/18/18 0441 05/19/18 0408 05/20/18 0402 05/21/18 0306 05/22/18 0400  AST 12* 17 12* 10* 10*  ALT 16 15 14 11 10   ALKPHOS 149* 148* 134* 129* 127*  BILITOT 0.4 0.2* 0.3 0.2* 0.2*  PROT 6.2* 6.3* 6.6 6.7 6.9  ALBUMIN 2.2* 2.3* 2.5* 2.5* 2.6*   No results for input(s): LIPASE, AMYLASE in the last 168 hours. No results for input(s): AMMONIA in the last 168 hours.  Coagulation Profile: No results for input(s): INR, PROTIME in the last 168 hours.  Cardiac Enzymes: No results for input(s): CKTOTAL, CKMB, CKMBINDEX, TROPONINI in the last 168 hours.  BNP (last 3 results) No results for input(s): PROBNP in the last 8760 hours.  HbA1C: No results for input(s): HGBA1C in the last 72 hours.  CBG: Recent Labs  Lab 05/21/18 0004 05/21/18 0749 05/21/18 1815 05/22/18 0005 05/22/18 0807  GLUCAP 103* 97 90 89 106*    Lipid Profile: No results for input(s): CHOL, HDL, LDLCALC, TRIG, CHOLHDL, LDLDIRECT in the last 72 hours.  Thyroid Function Tests: No results for input(s): TSH, T4TOTAL, FREET4, T3FREE, THYROIDAB in the last 72 hours.  Anemia  Panel: No results for input(s): VITAMINB12, FOLATE, FERRITIN, TIBC, IRON, RETICCTPCT in the last 72 hours.  Urine analysis:    Component Value Date/Time   COLORURINE YELLOW 05/15/2018 1109   APPEARANCEUR CLEAR 05/15/2018 1109   LABSPEC 1.013 05/15/2018 1109   PHURINE 7.0 05/15/2018 1109   GLUCOSEU NEGATIVE 05/15/2018 1109   HGBUR SMALL (A) 05/15/2018 1109   BILIRUBINUR NEGATIVE 05/15/2018 1109   KETONESUR NEGATIVE 05/15/2018 1109  PROTEINUR NEGATIVE 05/15/2018 1109   UROBILINOGEN 1.0 06/08/2010 1523   NITRITE NEGATIVE 05/15/2018 1109   LEUKOCYTESUR TRACE (A) 05/15/2018 1109    Sepsis Labs: Lactic Acid, Venous    Component Value Date/Time   LATICACIDVEN 1.07 04/29/2018 1334    MICROBIOLOGY: Recent Results (from the past 240 hour(s))  Culture, blood (routine x 2)     Status: None   Collection Time: 05/15/18  3:15 PM  Result Value Ref Range Status   Specimen Description BLOOD SITE NOT SPECIFIED  Final   Special Requests   Final    BOTTLES DRAWN AEROBIC ONLY Blood Culture results may not be optimal due to an inadequate volume of blood received in culture bottles   Culture   Final    NO GROWTH 5 DAYS Performed at [facility], [address], [place], [place] [address]    Report Status 05/20/2018 FINAL  Final  Culture, blood (routine x 2)     Status: None   Collection Time: 05/15/18  3:22 PM  Result Value Ref Range Status   Specimen Description BLOOD SITE NOT SPECIFIED  Final   Special Requests   Final    BOTTLES DRAWN AEROBIC ONLY Blood Culture results may not be optimal due to an inadequate volume of blood received in culture bottles   Culture   Final    NO GROWTH 5 DAYS Performed at [facility], [address] [place], [place] [address]    Report Status 05/20/2018 FINAL  Final    RADIOLOGY STUDIES/RESULTS: Ct Abdomen Pelvis Wo Contrast  Result Date: 04/30/2018 CLINICAL DATA:  Nausea, bilious vomiting EXAM: CT ABDOMEN AND PELVIS WITHOUT CONTRAST  TECHNIQUE: Multidetector CT imaging of the abdomen and pelvis was performed following the standard protocol without IV contrast. Sagittal and coronal MPR images reconstructed from axial data set. Oral contrast was not administered. COMPARISON:  None FINDINGS: Lower chest: Emphysematous changes with BILATERAL lower lobe infiltrates consistent with either pneumonia or aspiration. Hepatobiliary: Liver unremarkable. Gallbladder not well visualized due to streak artifacts, grossly unremarkable. Pancreas: Normal appearance Spleen: Normal appearance Adrenals/Urinary Tract: Question adrenal thickening bilaterally. No obvious renal mass or hydronephrosis. Ureters not visualized. Foley catheter decompresses urinary bladder. Stomach/Bowel: Nasogastric tube in stomach. Low-attenuation fluid distends stomach and multiple proximal small bowel loops, with small bowel loops up to 4.1 cm diameter. Distal small bowel loops and colon are decompressed. Scattered stool throughout colon. Findings likely represent mid small bowel obstruction. Appendix not visualized. Suspected rectal wall thickening. Vascular/Lymphatic: Atherosclerotic calcification aorta. Aorta normal caliber. Reproductive: Minimal prostatic enlargement and scattered prostatic calcifications. Other: No definite free air or free fluid. Nonspecific stranding of presacral fat. Musculoskeletal: No acute osseous findings. IMPRESSION: Dilated proximal and decompressed distal small bowel loops compatible with small bowel obstruction, likely in the mid small bowel. Due to lack of fat planes, streak artifacts, and lack of contrast, unable to localize site of obstruction. No evidence of perforation/free air. Question rectal wall thickening, recommend correlation with proctoscopy. Emphysematous changes with bibasilar pulmonary infiltrates either representing pneumonia or aspiration. Electronically Signed   By: Ulyses Southward M.D.   On: 04/30/2018 19:38   Dg Abd 1 View  Result  Date: 05/20/2018 CLINICAL DATA:  NG tube adjustment EXAM: ABDOMEN - 1 VIEW COMPARISON:  05/20/2018 FINDINGS: Esophageal tube tip and side-port project over gastric fundus, somewhat abrupt appearing curvature at the distal tip with persistent gaseous dilatation. Continued marked gaseous enlargement of bowel in the left abdomen up to 5 cm. Moderate  stool in the colon. Residual contrast within the bladder. IMPRESSION: 1. Esophageal tube tip and side port overlie the gastric fundus, somewhat abrupt curvature of the tip, could be due to mild kinking. 2. Persistent dilatation of stomach and left abdominal small bowel consistent with a bowel obstruction. Electronically Signed   By: Jasmine Pang M.D.   On: 05/20/2018 18:37   Dg Abd 1 View  Result Date: 05/20/2018 CLINICAL DATA:  NG tube placement. EXAM: ABDOMEN - 1 VIEW COMPARISON:  CT abdomen pelvis from same day. FINDINGS: Tip of the NG tube in the stomach with the proximal side port at the gastroesophageal junction. Dilated loops of small bowel in the left abdomen are unchanged. Contrast is seen within the renal collecting systems and bladder. No acute osseous abnormality. IMPRESSION: 1. NG tube tip in the stomach with the proximal side port at the gastroesophageal junction. Consider advancing 2-3 cm. 2. Unchanged small bowel obstruction. Electronically Signed   By: Obie Dredge M.D.   On: 05/20/2018 16:47   Dg Abd 1 View  Result Date: 05/16/2018 CLINICAL DATA:  Feeding tube advanced today. EXAM: ABDOMEN - 1 VIEW COMPARISON:  Plain films of the abdomen dated 05/15/2018 and 05/14/2018. FINDINGS: Enteric tube is coiled in the stomach with tip at the level of the gastric fundus/cardia. Distended gas-filled loops of large and small bowel are again appreciated throughout the abdomen and upper pelvis. IMPRESSION: 1. Feeding tube is coiled in the stomach with tip at the level of the gastric fundus/cardia. 2. Persistent gaseous distention of both large and small  bowel loops throughout the abdomen and pelvis, compatible with previous reports of obstruction or ileus. Electronically Signed   By: Bary Richard M.D.   On: 05/16/2018 20:26   Dg Abd 1 View  Result Date: 05/03/2018 CLINICAL DATA:  Small bowel obstruction EXAM: ABDOMEN - 1 VIEW COMPARISON:  05/02/2018 FINDINGS: NG tube is in the stomach. Gaseous distention of the stomach and left abdominal small bowel loops. Gas and stool within nondistended colon. No free air or organomegaly. IMPRESSION: Gaseous distention of stomach and left abdominal small bowel loops likely reflecting small bowel obstruction. NG tube is in the stomach. Electronically Signed   By: Charlett Nose M.D.   On: 05/03/2018 10:05   Dg Abd 1 View  Result Date: 05/02/2018 CLINICAL DATA:  Small bowel obstruction EXAM: ABDOMEN - 1 VIEW COMPARISON:  May 01, 2018 abdominal radiograph and CT abdomen and pelvis April 30, 2018 FINDINGS: Nasogastric tube no longer present. There is slightly less bowel dilatation in the left upper quadrant compared to 1 day prior. There is moderate air in the stomach. No bowel dilatation elsewhere. No air-fluid levels. No free air. There is moderate stool in the colon. There is atelectatic change in the right lung base. IMPRESSION: Nasogastric tube no longer appreciable. Less small bowel dilatation in the left upper quadrant. Question resolving bowel obstruction. No free air evident. Electronically Signed   By: Bretta Bang III M.D.   On: 05/02/2018 14:20   Ct Head Wo Contrast  Result Date: 04/29/2018 CLINICAL DATA:  Altered level of consciousness increased over last 2 days, 2 episodes of nausea and vomiting, history of prior brain injury, dementia, former smoker, hypertension EXAM: CT HEAD WITHOUT CONTRAST TECHNIQUE: Contiguous axial images were obtained from the base of the skull through the vertex without intravenous contrast. COMPARISON:  06/08/2010 FINDINGS: Brain: Generalized atrophy. Normal ventricular  morphology. No midline shift or mass effect. Small vessel chronic ischemic changes of deep  cerebral white matter. 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 No intracranial hemorrhage, mass lesion, evidence of acute infarction, or extra-axial fluid collection. Vascular: Minimal atherosclerotic calcification of internal carotid arteries at skull base Skull: Intact Sinuses/Orbits: Clear Other: N/A IMPRESSION: Atrophy with small vessel chronic ischemic changes of deep cerebral white matter. 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 No acute intracranial abnormalities. Electronically Signed   By: Ulyses Southward M.D.   On: 04/29/2018 09:58   Ct Abdomen Pelvis W Contrast  Result Date: 05/20/2018 CLINICAL DATA:  Nausea and vomiting. 10 days status post laparoscopic lysis of adhesions and bowel resection. EXAM: CT ABDOMEN AND PELVIS WITH CONTRAST TECHNIQUE: Multidetector CT imaging of the abdomen and pelvis was performed using the standard protocol following bolus administration of intravenous contrast. CONTRAST:  ISOVUE-300 IOPAMIDOL (ISOVUE-300) INJECTION 61% COMPARISON:  04/30/2018 FINDINGS: Lower Chest: Decreased airspace disease seen in both lower lobes, likely due to resolving pneumonia. Hepatobiliary: A few tiny left hepatic lobe cysts are seen. No hepatic masses identified. Tiny calcified gallstones or layering sludge noted, however there is no evidence of cholecystitis or biliary ductal dilatation. Pancreas:  No mass or inflammatory changes. Spleen: Within normal limits in size and appearance. Adrenals/Urinary Tract: No masses identified. No evidence of hydronephrosis. Stomach/Bowel: Small amount of free air is seen, likely postoperative in etiology. No abscess identified. Multiple moderately dilated fluid-filled small bowel loops are seen in the left abdomen, with transition to nondilated small bowel in the left lower quadrant in area of surgical staples. This is consistent with a  small-bowel obstruction. No mass or inflammatory process identified. Moderate amount of stool again seen in the right colon. Vascular/Lymphatic: No pathologically enlarged lymph nodes. No abdominal aortic aneurysm. Reproductive: Mild-to-moderate enlargement of prostate gland shows no significant change. Other: Subcutaneous emphysema is seen in the left anterior abdominal wall, likely postoperative in etiology. Musculoskeletal:  No suspicious bone lesions identified. IMPRESSION: Small bowel obstruction, with transition point in left lower quadrant. This may be due to adhesion, as no obstructing mass or inflammatory process identified. Small amount of postop intraperitoneal air and subcutaneous emphysema. No evidence of abscess. Cholelithiasis.  No radiographic evidence of cholecystitis. Decreased bilateral lower lobe airspace disease, consistent with resolving pneumonia or inflammatory process. Electronically Signed   By: Myles Rosenthal M.D.   On: 05/20/2018 13:41   Dg Chest Port 1 View  Result Date: 05/15/2018 CLINICAL DATA:  Shortness of breath. Patient 5 days postop bowel resection with lysis of adhesions. EXAM: PORTABLE CHEST 1 VIEW COMPARISON:  05/11/2018 FINDINGS: Right-sided PICC line with tip over the SVC. Lungs are adequately inflated with subtle left retrocardiac opacification which may be due to vascular crowding or atelectasis and less likely infection. Remainder of the lungs are clear. Cardiomediastinal silhouette is within normal. Persistent evidence of free peritoneal air compatible with patient's recent surgery. IMPRESSION: Mild left base opacification likely atelectasis or vascular crowding and less likely infection. Persistent free peritoneal air compatible recent postop state. Right-sided PICC line with tip over the SVC. Electronically Signed   By: Elberta Fortis M.D.   On: 05/15/2018 09:38   Dg Chest Port 1 View  Result Date: 05/11/2018 CLINICAL DATA:  Shortness of breath, hypertension,  pneumonia, dementia, former smoker EXAM: PORTABLE CHEST 1 VIEW COMPARISON:  Portable exam 0945 hours compared to 05/09/2018 FINDINGS: Nasogastric tube extends into stomach. RIGHT arm PICC line tip projects over SVC. Normal heart size and pulmonary vascularity. Elongation of thoracic aorta. Lungs appear emphysematous but clear. No  pleural effusion or pneumothorax. Bones demineralized. Free air identified under the hemidiaphragms bilaterally; EHR indicates patient underwent a laparoscopic procedure on 05/10/2018, small-bowel resection and lysis of adhesions for small-bowel obstruction prior operative note. IMPRESSION: COPD changes without infiltrate. Free air consistent with abdominal surgery 1 day ago. Electronically Signed   By: Ulyses Southward M.D.   On: 05/11/2018 10:21   Dg Chest Port 1 View  Result Date: 05/09/2018 CLINICAL DATA:  Cough EXAM: PORTABLE CHEST 1 VIEW COMPARISON:  05/04/2018 FINDINGS: NG tube is stable. Right upper extremity PICC placed. Tip is at the cavoatrial junction. Lungs remain hyperaerated and clear. Small right pleural effusion is stable. No pneumothorax. Aorta remains somewhat prominent due to tortuosity and rotation of the thorax. IMPRESSION: New right upper extremity PICC with its tip at the cavoatrial junction. No active cardiopulmonary disease. Electronically Signed   By: Jolaine Click M.D.   On: 05/09/2018 07:37   Dg Chest Port 1 View  Result Date: 04/29/2018 CLINICAL DATA:  Altered mental status EXAM: PORTABLE CHEST 1 VIEW COMPARISON:  April 12, 2018 FINDINGS: There is mild scarring in the lateral right base and in the medial right lower lung region. There is no edema or consolidation. The heart size and pulmonary vascularity are normal. No adenopathy. There is a recent appearing fracture of the posterior right seventh rib with alignment essentially anatomic. An older fracture of the posterior right eighth rib is noted. No pneumothorax. IMPRESSION: Recent appearing fracture  posterior right seventh rib. Older fracture posterior right eighth rib noted. Areas of mild scarring noted on the right. No edema or consolidation. Stable cardiac silhouette. Electronically Signed   By: Bretta Bang III M.D.   On: 04/29/2018 08:46   Dg Chest Port 1v Same Day  Result Date: 05/01/2018 CLINICAL DATA:  history of schizophrenia, hypertension, BPH-mostly wheelchair-bound-presented to the hospital with approximately 1 week history of intermittent vomiting, confusion for a few days prior to this hospital admission-presented with sepsis secondary to UTI, and significant constipation. C/o SOB EXAM: PORTABLE CHEST - 1 VIEW SAME DAY COMPARISON:  04/29/2018 FINDINGS: Lungs are hyperinflated. Subsegmental atelectasis or early infiltrate at the left lung base, new since previous. Right lung clear. Heart size normal.  Tortuous ectatic thoracic aorta. No effusion. No pneumothorax. Right anterolateral fourth rib fracture, age indeterminate. Nasogastric tube extends to the stomach. IMPRESSION: 1. New patchy infiltrate or subsegmental atelectasis at the left lung base. 2. Right fourth rib fracture without pneumothorax. 3. Nasogastric tube placement to the stomach. Electronically Signed   By: Corlis Leak M.D.   On: 05/01/2018 08:24   Dg Abd 2 Views  Result Date: 05/05/2018 CLINICAL DATA:  Vomiting.  History of right lumpectomy. EXAM: ABDOMEN - 2 VIEW COMPARISON:  05/04/2018 FINDINGS: Improve dilation of small bowel loops with residual borderline gas-filled distended small bowel loops in the left upper quadrant of the abdomen. Normal colonic bowel gas pattern. No evidence of free intra-abdominal gas. Normal cardiac silhouette. Tortuosity of the thoracic aorta. Right pleural reflection versus small pleural effusion. No lobar airspace consolidation. Enteric catheter tip within the expected location the gastric body, side hole at the GE junction level. Right PICC line terminates in the expected location of the  cavoatrial junction. IMPRESSION: Improved appearance of small-bowel obstruction. Enteric catheter with side hole at the level of the GE junction. Advancement may be considered. Possible small right pleural effusion. Electronically Signed   By: Ted Mcalpine M.D.   On: 05/05/2018 12:51   Dg Abd Acute W/chest  Result Date: 05/04/2018 CLINICAL DATA:  Follow-up small bowel obstruction. EXAM: DG ABDOMEN ACUTE W/ 1V CHEST COMPARISON:  Abdominal x-rays 05/04/1999 19 dating back to 04/29/2018. CT abdomen and pelvis 04/30/2018. Chest x-rays 05/01/2018, 04/12/2018. FINDINGS: Nasogastric tube tip in the fundus of the stomach. Persistent marked gaseous distension of the jejunum in the LEFT UPPER QUADRANT, slowly progressive over the past 3 days, with air-fluid levels on the LATERAL decubitus image. No evidence of free intraperitoneal air. Normal caliber colon with moderate stool burden. Calcified uterine fibroid in the pelvic midline. Cardiac silhouette normal in size, unchanged. Thoracic aorta tortuous and atherosclerotic. Hilar and mediastinal contours otherwise unremarkable. Interval improvement in aeration in the LEFT lung base, with only minimal patchy opacities persisting. Lungs otherwise clear. Emphysematous changes in both lungs with scarring in the RIGHT mid lung, unchanged. Pleuroparenchymal scarring at the RIGHT lung base which accounts for the blunting of the costophrenic angle, unchanged dating back to the original 04/12/2018 examination. IMPRESSION: 1. Persistent partial small bowel obstruction which has slowly worsened over the past 3 days. 2. No evidence of free intraperitoneal air. 3. Improved aeration in the LEFT LOWER LOBE, with only mild atelectasis and/or pneumonia persisting. Electronically Signed   By: Hulan Saas M.D.   On: 05/04/2018 09:28   Dg Abd Portable 1v-small Bowel Obstruction Protocol-initial, 8 Hr Delay  Result Date: 05/21/2018 CLINICAL DATA:  Small-bowel obstruction.   Follow-up exam. EXAM: PORTABLE ABDOMEN - 1 VIEW COMPARISON:  05/20/2018 FINDINGS: Significant improvement in small bowel dilation is noted since the prior study. The small bowel obstruction has essentially resolved radiographically. Mild generalized increased stool burden in the colon, unchanged from the previous exam. Midline abdominal wall surgical staples are stable. Nasogastric tube is well positioned, tip in the mid stomach. IMPRESSION: 1. Significant interval improvement from the previous day's exam. Small-bowel obstruction appears resolved radiographically. Electronically Signed   By: Amie Portland M.D.   On: 05/21/2018 13:12   Dg Abd Portable 1v  Result Date: 05/17/2018 CLINICAL DATA:  Small-bowel obstruction. EXAM: PORTABLE ABDOMEN - 1 VIEW COMPARISON:  05/16/2018.  05/15/2018.  CT 04/30/2018. FINDINGS: Surgical staples noted over the abdomen. NG tube noted with its tip in the stomach. Distended loops of small and large bowel are again noted. Similar findings noted on prior exam. No free air. Degenerative change in osteopenia lumbar spine and both hips. Prostate calcifications are noted. Aortoiliac atherosclerotic vascular calcification. Diffuse osteopenia degenerative changes lumbar spine and both hips. Mild blunting of the right costophrenic angle again noted suggesting scarring and/or tiny effusion. IMPRESSION: NG tube noted with its tip in the stomach. Dilated loops of small and large bowel are again noted without significant interim change. No free air identified. Electronically Signed   By: Maisie Fus  Register   On: 05/17/2018 07:32   Dg Abd Portable 1v  Result Date: 05/15/2018 CLINICAL DATA:  NG tube placement EXAM: PORTABLE ABDOMEN - 1 VIEW COMPARISON:  05/15/2018 FINDINGS: NG tube is in placed into the fundus of the stomach. Continued gaseous distention of visualized upper abdominal bowel loops. IMPRESSION: NG tube in the fundus of the stomach Electronically Signed   By: Charlett Nose M.D.    On: 05/15/2018 20:11   Dg Abd Portable 1v  Result Date: 05/15/2018 CLINICAL DATA:  Small bowel obstruction. EXAM: PORTABLE ABDOMEN - 1 VIEW COMPARISON:  One-view abdomen 05/14/2018 FINDINGS: Patient's retained left.  Heart size normal.  Lung bases are clear. Extend loops of large and small bowel are stable. There is no free air  or pneumatosis. Surgical clips are noted. IMPRESSION: 1. Similar appearance of distended large and small bowel consistent with obstruction or ileus. Electronically Signed   By: Marin Roberts M.D.   On: 05/15/2018 08:10   Dg Abd Portable 1v  Result Date: 05/14/2018 CLINICAL DATA:  Abdominal pain EXAM: PORTABLE ABDOMEN - 1 VIEW COMPARISON:  05/13/2018 FINDINGS: Interval NG tube removal. Some interval improvement in gaseous distention of the bowel. Stool and air throughout the transverse colon. Postop staples again noted. Air in the rectum. No significant interval change. Degenerative changes of the spine. Basilar chronic interstitial changes and atelectasis. IMPRESSION: NG tube removed. Stable nonspecific bowel gas pattern, suspect resolving obstruction or ileus. Little interval change. Electronically Signed   By: Judie Petit.  Shick M.D.   On: 05/14/2018 10:23   Dg Abd Portable 1v  Result Date: 05/13/2018 CLINICAL DATA:  Abdominal pain, check NG placement EXAM: PORTABLE ABDOMEN - 1 VIEW COMPARISON:  05/09/2018 FINDINGS: Nasogastric catheter is noted with the tip in the stomach. Proximal side port lies in the distal esophagus. This is stable from the prior exam. Scattered large and small bowel gas is noted. Postsurgical changes are now seen. Mild small bowel dilatation is noted which may be related to a postoperative ileus. IMPRESSION: Mild postoperative ileus. Nasogastric catheter as described. Electronically Signed   By: Alcide Clever M.D.   On: 05/13/2018 09:56   Dg Abd Portable 1v  Result Date: 05/09/2018 CLINICAL DATA:  Small-bowel obstruction EXAM: PORTABLE ABDOMEN - 1  VIEW COMPARISON:  05/08/2018; 05/06/2018; 05/05/2018 FINDINGS: Re-demonstrated gaseous distention of several loops of small bowel with index loop of small bowel within the left mid hemiabdomen measuring 3.6 cm in diameter. Moderate colonic stool burden. No supine evidence of pneumoperitoneum. No pneumatosis or portal venous gas. Presumed calcified fibroid overlies the left hemipelvis. Stable position of support apparatus. No acute osseus abnormalities. IMPRESSION: No change to slight improvement in suspected small-bowel obstruction. Electronically Signed   By: Simonne Come M.D.   On: 05/09/2018 07:47   Dg Abd Portable 1v  Result Date: 05/08/2018 CLINICAL DATA:  Follow up small bowel obstruction EXAM: PORTABLE ABDOMEN - 1 VIEW COMPARISON:  05/06/2018 FINDINGS: Scattered large and small bowel gas is noted. Multiple dilated loops of small bowel are again identified and stable in appearance. Nasogastric catheter is again noted within the stomach. No free air is seen. Degenerative changes of the lumbar spine are noted. IMPRESSION: Persistent small bowel dilatation. No new focal abnormality is noted. Electronically Signed   By: Alcide Clever M.D.   On: 05/08/2018 08:36   Dg Abd Portable 1v  Result Date: 05/06/2018 CLINICAL DATA:  Small bowel obstruction EXAM: PORTABLE ABDOMEN - 1 VIEW COMPARISON:  Portable exam at 0628 hours compared to 05/05/2018 FINDINGS: Stool scattered throughout colon. Persistent dilatation of small bowel loops in the LEFT mid abdomen up to 4.9 cm diameter. No bowel wall thickening. Bones demineralized. No definite renal calcifications. IMPRESSION: Persistent small bowel dilatation consistent with small bowel obstruction. Little interval change. Electronically Signed   By: Ulyses Southward M.D.   On: 05/06/2018 10:21   Dg Abd Portable 1v-small Bowel Obstruction Protocol-initial, 8 Hr Delay  Result Date: 05/04/2018 CLINICAL DATA:  Small bowel obstruction protocol. 8 hour delay. EXAM: PORTABLE  ABDOMEN - 1 VIEW COMPARISON:  05/03/2018 FINDINGS: Gaseous distention of left upper quadrant small bowel. Enteric tube with tip projected over the mid abdomen consistent with location in the upper stomach. Contrast material is demonstrated in the dilated small bowel. No  contrast material is identified in the colon. This is suggestive of high-grade obstruction. Degenerative changes in the spine. IMPRESSION: Contrast material is demonstrated in the dilated small bowel but not in the colon consistent with high-grade obstruction. Electronically Signed   By: Burman Nieves M.D.   On: 05/04/2018 01:55   Dg Abd Portable 1v-small Bowel Obstruction Protocol-initial, 8 Hr Delay  Result Date: 05/01/2018 CLINICAL DATA:  Small bowel obstruction. EXAM: PORTABLE ABDOMEN - 1 VIEW COMPARISON:  Radiograph earlier this day at 1109 hour, CT yesterday. FINDINGS: Enteric tube in place with tip in the stomach, side-port just beyond the gastroesophageal junction. Improving small bowel dilatation in the left abdomen. Moderate stool in the right and transverse colon. No evidence of free air. Pelvic calcification may be stone in the urinary bladder or exophytic prostate calcification. IMPRESSION: Improving small bowel dilatation in the left abdomen since radiograph earlier this day. Electronically Signed   By: Narda Rutherford M.D.   On: 05/01/2018 21:14   Dg Abd Portable 1v-small Bowel Protocol-position Verification  Result Date: 05/01/2018 CLINICAL DATA:  Nasogastric tube placement EXAM: PORTABLE ABDOMEN - 1 VIEW COMPARISON:  Portable exam 1109 hours compared to 04/30/2018 FINDINGS: Tip of nasogastric tube projects over mid stomach. Dilated small bowel loops are seen in the LEFT mid abdomen. Increased stool in RIGHT colon. Lung bases appear emphysematous with subsegmental atelectasis at LEFT base. Bones demineralized. IMPRESSION: Tip of nasogastric tube projects over mid stomach. Small bowel dilatation in LEFT mid abdomen with  increased stool in RIGHT colon. Electronically Signed   By: Ulyses Southward M.D.   On: 05/01/2018 11:22   Dg Abd Portable 1v  Result Date: 04/30/2018 CLINICAL DATA:  67 year old male with vomiting. EXAM: PORTABLE ABDOMEN - 1 VIEW COMPARISON:  04/29/2018 abdominal radiographs. FINDINGS: Portable AP semi upright and supine views at 0847 hours. Enteric tube has been placed and the stomach now appears decompressed. NG tube side hole at the gastric body. Non obstructed bowel gas pattern. Moderate volume of retained stool redemonstrated in the transverse colon and distal sigmoid/rectum. Negative visible lung bases. No pneumoperitoneum. Stable abdominal and pelvic visceral contours. Dystrophic calcification in the pelvis. Stable pelvic catheter, likely a Foley. No acute osseous abnormality identified. IMPRESSION: 1. NG tube in place with resolved gastric distention. 2. Non obstructed bowel gas pattern with moderate volume of retained stool. Electronically Signed   By: Odessa Fleming M.D.   On: 04/30/2018 09:06   Dg Abd Portable 1v  Result Date: 04/29/2018 CLINICAL DATA:  Nausea and vomiting EXAM: PORTABLE ABDOMEN - 1 VIEW COMPARISON:  None. FINDINGS: Gaseous distention of the stomach. Formed stool throughout the colon. No concerning mass effect or calcification. IMPRESSION: Prominent gaseous distension of the stomach. Constipated appearance. Electronically Signed   By: Marnee Spring M.D.   On: 04/29/2018 14:44   [place] Ekg Site Rite  Result Date: 05/04/2018 If Site Rite image not attached, placement could not be confirmed due to current cardiac rhythm.    LOS: 23 days   Signature  Susa Raring M.D on 05/22/2018 at 10:09 AM   -  To page go to [email or web address] - password [facility]

## 2018-05-22 NOTE — Progress Notes (Signed)
PHARMACY - ADULT TOTAL PARENTERAL NUTRITION CONSULT NOTE   Pharmacy Consult for TPN Indication: SBO  Patient Measurements: Height: 5\' 6"  (167.6 cm) Weight: 110 lb (49.9 kg) IBW/kg (Calculated) : 63.8 TPN AdjBW (KG): 49.9 Body mass index is 17.75 kg/m.  Assessment:  73 YOM admitted on 10/4 with sepsis. He was found to have pneumonia and a possible SBO on CT on 10/5. Pt has a history of SBO s/p colectomy in 1993 (history (in the 1990s) of trauma causing small bowel injury requiring laparotomy). An NG tube was placed with improving small bowel dilatation but patient had 3-4 episodes of vomiting overnight on 10/7-10/8 and the NG tube had to be replaced due to incorrect placement. Pharmacy now consulted to manage TPN due to worsening SBO and concern for adhesions that require surgery.  Actual body is significantly below ideal body weight.  Of note, patient is extremely cachectic. Patient hx unreliable due to mental status.  GI: Pre-albumin down to 15.7. S/p ex-lap with LOA, SBR and anastomosis on 10/15. NGT not clamped as ordered 10/22, clamped 10/23 and allowing some clears, going slow per Surgery. +BS, no BM recorded. Xray w/ colonic "ileus". Emesis x 1 overnight  10/25 - Persistent SBO via Xray 10/27 - Resolved SBO via Xray Endo: No hx DM - CBGs controlled, SSI d/c'd 10/14 Lytes: WNL, (Goal K>/=4 and Mg>2/= with ileus) Renal: SCr stable WNL, UOP 0.3 cc/kg/hr, LR at [facility] Pulm: RA Cards: VSS.  Hepatobil: LFTs / Tbili / TG wnl, Lipase elevated 10/4  TG 91 > 67 - - F/U AM labs Neuro: Schizophrenia. Dementia. Benztropine (held since 10/20), Zyprexa bid, IV valproate - Can't really make appropriate decisions ID: s/p abx for Klebsiella UTI and aspiration PNA. Afebrile, WBC WNL Heme: SQ heparin. Plts stable. Transfuse 10/20 > Hg low stable  TPN Access: double lumen PICC placed 10/10 TPN start date: 05/05/18  Nutritional Goals (per RD rec on 10/21): 1550-1750kCal, 85-100 gm protein, >1.5 L  fluid per day  Current Nutrition:  TPN Clear liquid diet started 10/24 - no intake documented  Plan:  Continue TPN at 65 mL/hr, providing 94 g of protein, 218 g of dextrose, and 55 g of lipids for a total of 1,663 kCals per day, meeting 100% of patient needs Electrolytes in TPN: Continue current regimen, Cl:Ac ratio 1:1 Add MVI, trace elements, Pepcid 20mg  in TPN Continue to monitor CBGs and adjust as needed Continue LR at 10 ml/hr per MD F/U TPN labs in AM, plans for advancing diet / weaning TPN   Nadara Mustard, PharmD., MS Clinical Pharmacist Pager:  [phone] Thank you for allowing pharmacy to be part of this patients care team. 05/22/2018, 7:49 AM

## 2018-05-22 NOTE — Progress Notes (Signed)
[facility] Progress Note  12 Days Post-Op  Subjective: CC:  Continues to complain of mild abdominal pain worse with movement. No BM past 24h. Per sister who is at bedside he had a BM 2 days ago after an enema.   Objective: Vital signs in last 24 hours: Temp:  [98.8 F (37.1 C)-99.8 F (37.7 C)] 98.8 F (37.1 C) (10/27 0643) Pulse Rate:  [85-94] 85 (10/27 0643) Resp:  [16-18] 18 (10/27 0643) BP: (91-96)/(64-68) 94/68 (10/27 0643) SpO2:  [97 %-100 %] 100 % (10/27 0643) Last BM Date: 05/20/18  Intake/Output from previous day: 10/26 0701 - 10/27 0700 In: 880 [I.V.:780; IV Piggyback:100] Out: 2050 [Urine:900; Emesis/NG output:1150]  PE: Gen:  Alert, NAD, pleasant Card:  Regular rate and rhythm, no peripheral edema Pulm:  Normal effort Abd: Soft, scaphoid, +BS, non-tender, non-distended. Skin: warm and dry, no rashes   Lab Results:  Recent Labs    05/21/18 0306 05/22/18 0400  WBC 12.4* 12.1*  HGB 9.3* 9.5*  HCT 30.3* 30.8*  PLT 579* 546*   BMET Recent Labs    05/21/18 0306 05/22/18 0400  NA 136 138  K 4.7 4.9  CL 105 102  CO2 24 24  GLUCOSE 108* 99  BUN 25* 33*  CREATININE 0.69 0.78  CALCIUM 9.3 9.7   CMP     Component Value Date/Time   NA 138 05/22/2018 0400   K 4.9 05/22/2018 0400   CL 102 05/22/2018 0400   CO2 24 05/22/2018 0400   GLUCOSE 99 05/22/2018 0400   BUN 33 (H) 05/22/2018 0400   CREATININE 0.78 05/22/2018 0400   CALCIUM 9.7 05/22/2018 0400   PROT 6.9 05/22/2018 0400   ALBUMIN 2.6 (L) 05/22/2018 0400   AST 10 (L) 05/22/2018 0400   ALT 10 05/22/2018 0400   ALKPHOS 127 (H) 05/22/2018 0400   BILITOT 0.2 (L) 05/22/2018 0400   GFRNONAA >60 05/22/2018 0400   GFRAA >60 05/22/2018 0400   Lipase     Component Value Date/Time   LIPASE 204 (H) 04/29/2018 0904   Studies/Results: Dg Abd 1 View  Result Date: 05/20/2018 CLINICAL DATA:  NG tube adjustment EXAM: ABDOMEN - 1 VIEW COMPARISON:  05/20/2018 FINDINGS: Esophageal tube  tip and side-port project over gastric fundus, somewhat abrupt appearing curvature at the distal tip with persistent gaseous dilatation. Continued marked gaseous enlargement of bowel in the left abdomen up to 5 cm. Moderate stool in the colon. Residual contrast within the bladder. IMPRESSION: 1. Esophageal tube tip and side port overlie the gastric fundus, somewhat abrupt curvature of the tip, could be due to mild kinking. 2. Persistent dilatation of stomach and left abdominal small bowel consistent with a bowel obstruction. Electronically Signed   By: Jasmine Pang M.D.   On: 05/20/2018 18:37   Dg Abd 1 View  Result Date: 05/20/2018 CLINICAL DATA:  NG tube placement. EXAM: ABDOMEN - 1 VIEW COMPARISON:  CT abdomen pelvis from same day. FINDINGS: Tip of the NG tube in the stomach with the proximal side port at the gastroesophageal junction. Dilated loops of small bowel in the left abdomen are unchanged. Contrast is seen within the renal collecting systems and bladder. No acute osseous abnormality. IMPRESSION: 1. NG tube tip in the stomach with the proximal side port at the gastroesophageal junction. Consider advancing 2-3 cm. 2. Unchanged small bowel obstruction. Electronically Signed   By: Obie Dredge M.D.   On: 05/20/2018 16:47   Ct Abdomen Pelvis W Contrast  Result Date: 05/20/2018  CLINICAL DATA:  Nausea and vomiting. 10 days status post laparoscopic lysis of adhesions and bowel resection. EXAM: CT ABDOMEN AND PELVIS WITH CONTRAST TECHNIQUE: Multidetector CT imaging of the abdomen and pelvis was performed using the standard protocol following bolus administration of intravenous contrast. CONTRAST:  ISOVUE-300 IOPAMIDOL (ISOVUE-300) INJECTION 61% COMPARISON:  04/30/2018 FINDINGS: Lower Chest: Decreased airspace disease seen in both lower lobes, likely due to resolving pneumonia. Hepatobiliary: A few tiny left hepatic lobe cysts are seen. No hepatic masses identified. Tiny calcified gallstones or  layering sludge noted, however there is no evidence of cholecystitis or biliary ductal dilatation. Pancreas:  No mass or inflammatory changes. Spleen: Within normal limits in size and appearance. Adrenals/Urinary Tract: No masses identified. No evidence of hydronephrosis. Stomach/Bowel: Small amount of free air is seen, likely postoperative in etiology. No abscess identified. Multiple moderately dilated fluid-filled small bowel loops are seen in the left abdomen, with transition to nondilated small bowel in the left lower quadrant in area of surgical staples. This is consistent with a small-bowel obstruction. No mass or inflammatory process identified. Moderate amount of stool again seen in the right colon. Vascular/Lymphatic: No pathologically enlarged lymph nodes. No abdominal aortic aneurysm. Reproductive: Mild-to-moderate enlargement of prostate gland shows no significant change. Other: Subcutaneous emphysema is seen in the left anterior abdominal wall, likely postoperative in etiology. Musculoskeletal:  No suspicious bone lesions identified. IMPRESSION: Small bowel obstruction, with transition point in left lower quadrant. This may be due to adhesion, as no obstructing mass or inflammatory process identified. Small amount of postop intraperitoneal air and subcutaneous emphysema. No evidence of abscess. Cholelithiasis.  No radiographic evidence of cholecystitis. Decreased bilateral lower lobe airspace disease, consistent with resolving pneumonia or inflammatory process. Electronically Signed   By: Myles Rosenthal M.D.   On: 05/20/2018 13:41   Dg Abd Portable 1v-small Bowel Obstruction Protocol-initial, 8 Hr Delay  Result Date: 05/21/2018 CLINICAL DATA:  Small-bowel obstruction.  Follow-up exam. EXAM: PORTABLE ABDOMEN - 1 VIEW COMPARISON:  05/20/2018 FINDINGS: Significant improvement in small bowel dilation is noted since the prior study. The small bowel obstruction has essentially resolved radiographically.  Mild generalized increased stool burden in the colon, unchanged from the previous exam. Midline abdominal wall surgical staples are stable. Nasogastric tube is well positioned, tip in the mid stomach. IMPRESSION: 1. Significant interval improvement from the previous day's exam. Small-bowel obstruction appears resolved radiographically. Electronically Signed   By: Amie Portland M.D.   On: 05/21/2018 13:12    Anti-infectives: Anti-infectives (From admission, onward)   Start     Dose/Rate Route Frequency Ordered Stop   05/10/18 0941  ceFAZolin (ANCEF) 2-4 GM/100ML-% IVPB    Note to Pharmacy:  [name]   : cabinet override      05/10/18 0941 05/10/18 2144   05/01/18 1215  metroNIDAZOLE (FLAGYL) IVPB 500 mg  Status:  Discontinued     500 mg 100 mL/hr over 60 Minutes Intravenous Every 8 hours 05/01/18 1201 05/06/18 1153   04/30/18 1100  cefTRIAXone (ROCEPHIN) 2 g in sodium chloride 0.9 % 100 mL IVPB  Status:  Discontinued     2 g 200 mL/hr over 30 Minutes Intravenous Every 24 hours 04/29/18 1416 05/06/18 1153   04/29/18 1430  cefTRIAXone (ROCEPHIN) 1 g in sodium chloride 0.9 % 100 mL IVPB     1 g 200 mL/hr over 30 Minutes Intravenous Every 24 hours 04/29/18 1416 04/30/18 1429   04/29/18 1130  cefTRIAXone (ROCEPHIN) 1 g in sodium chloride 0.9 %  100 mL IVPB     1 g 200 mL/hr over 30 Minutes Intravenous  Once 04/29/18 1118 04/29/18 1251     Assessment/Plan Hx of prior lung surgery Acute metabolic encephalopathy Anemia- transfused 10/14 - 6.9/21 today Dr. 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 SBO vs ileus vs dysmotility. Continue NG to LIWS.   -OOB to chair!   -cont TNA for nutritional support at this time, once tolerating more to eat, will start to wean  ID -rocephin 10/4>>10/11, flagyl 10/6>>10/11; WBC 12 - stable FEN -IVF,clear liquids, TNA VTE -SCDs, SQ heparin Foley -condom cath    LOS: 23 days    Hosie Spangle, [facility] Pager: [phone]

## 2018-05-23 LAB — TRIGLYCERIDES: Triglycerides: 53 mg/dL (ref <150)

## 2018-05-23 LAB — COMPREHENSIVE METABOLIC PANEL
ALK PHOS: 114 U/L (ref 38–126)
ALT: 8 U/L (ref 0–44)
AST: 10 U/L — AB (ref 15–41)
Albumin: 2.7 g/dL — ABNORMAL LOW (ref 3.5–5.0)
Anion gap: 5 (ref 5–15)
BUN: 37 mg/dL — AB (ref 8–23)
CALCIUM: 9.4 mg/dL (ref 8.9–10.3)
CHLORIDE: 105 mmol/L (ref 98–111)
CO2: 25 mmol/L (ref 22–32)
CREATININE: 0.72 mg/dL (ref 0.61–1.24)
GFR calc Af Amer: >60 mL/min (ref >60)
GFR calc non Af Amer: >60 mL/min (ref >60)
Glucose, Bld: 93 mg/dL (ref 70–99)
Potassium: 4.5 mmol/L (ref 3.5–5.1)
SODIUM: 135 mmol/L (ref 135–145)
Total Bilirubin: 0.3 mg/dL (ref 0.3–1.2)
Total Protein: 7.1 g/dL (ref 6.5–8.1)

## 2018-05-23 LAB — DIFFERENTIAL
BASOS ABS: 0 10*3/uL (ref 0.0–0.1)
Basophils Relative: 0 %
EOS ABS: 0.4 10*3/uL (ref 0.0–0.5)
Eosinophils Relative: 3 %
LYMPHS ABS: 2.3 10*3/uL (ref 0.7–4.0)
LYMPHS PCT: 21 %
MONOS PCT: 6 %
Monocytes Absolute: 0.7 10*3/uL (ref 0.1–1.0)
NEUTROS PCT: 66 %
Neutro Abs: 7.1 10*3/uL (ref 1.7–7.7)

## 2018-05-23 LAB — GLUCOSE, CAPILLARY
GLUCOSE-CAPILLARY: 107 mg/dL — AB (ref 70–99)
GLUCOSE-CAPILLARY: 101 mg/dL — AB (ref 70–99)
Glucose-Capillary: 95 mg/dL (ref 70–99)

## 2018-05-23 LAB — MAGNESIUM: Magnesium: 2.1 mg/dL (ref 1.7–2.4)

## 2018-05-23 LAB — PREALBUMIN: PREALBUMIN: 30.1 mg/dL (ref 18–38)

## 2018-05-23 LAB — PHOSPHORUS: Phosphorus: 4.3 mg/dL (ref 2.5–4.6)

## 2018-05-23 MED ORDER — TRAVASOL 10 % IV SOLN
INTRAVENOUS | Status: AC
  Administered 2018-05-23: 17:00:00 via INTRAVENOUS
  Filled 2018-05-23: qty 936

## 2018-05-23 MED ORDER — KETOROLAC TROMETHAMINE 15 MG/ML IJ SOLN
15.0000 mg | Freq: Three times a day (TID) | INTRAMUSCULAR | Status: AC | PRN
  Administered 2018-05-24 – 2018-05-25 (×3): 15 mg via INTRAVENOUS
  Filled 2018-05-23 (×3): qty 1

## 2018-05-23 MED ORDER — ACETAMINOPHEN 650 MG RE SUPP
650.0000 mg | Freq: Four times a day (QID) | RECTAL | Status: DC | PRN
  Administered 2018-05-23: 650 mg via RECTAL
  Filled 2018-05-23: qty 1

## 2018-05-23 NOTE — Progress Notes (Signed)
Physical Therapy Treatment Patient Details Name: Jeffrey Davila MRN: [id number] DOB: 1950/09/28 Today's Date: 05/23/2018    History of Present Illness Pt is a 67 y/o male admitted secondary to AMS and Sepsis most likely secondary to UTI. CT negative for acute abnormality. Work up during hospitalizations shows, aspiration PNA, SBO, acute metabolic encephalopathy, anemia, AKI, severe malnutrition, deconditioning/debility, and hyponatremia.  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  PMH inlcudes BI, dementia, HTN, and schizophrenia.     PT Comments    Slow to progress.  Found lying in a flexed position and took time to stretch out slowly forming contracture.  Emphasized coming to EOB, standing and ambulation.   Follow Up Recommendations  SNF;Supervision/Assistance - 24 hour     Equipment Recommendations  Other (comment)(TBA)    Recommendations for Other Services       Precautions / Restrictions Precautions Precautions: Fall    Mobility  Bed Mobility Overal bed mobility: Needs Assistance Bed Mobility: Supine to Sit     Supine to sit: Mod assist     General bed mobility comments: cues for direction, truncal assist forward up via R elbow.  Transfers Overall transfer level: Needs assistance Equipment used: 1 person hand held assist;2 person hand held assist Transfers: Sit to/from Stand Sit to Stand: Min assist;+2 physical assistance         General transfer comment: assist to keep pt forward while also giving less boost.  Ambulation/Gait Ambulation/Gait assistance: Mod assist;+2 safety/equipment Gait Distance (Feet): 50 Feet Assistive device: 2 person hand held assist Gait Pattern/deviations: Step-to pattern;Step-through pattern;Shuffle Gait velocity: slower Gait velocity interpretation: <1.31 ft/sec, indicative of household ambulator General Gait Details: pt relatively retropulsive overall with short  uncoordinated steps, becoming mor weak and halted as he started fatiguing.   Stairs             Wheelchair Mobility    Modified Rankin (Stroke Patients Only)       Balance Overall balance assessment: Needs assistance   Sitting balance-Leahy Scale: Poor     Standing balance support: During functional activity Standing balance-Leahy Scale: Poor Standing balance comment: posterior lean needing external assist                            Cognition Arousal/Alertness: Awake/alert Behavior During Therapy: WFL for tasks assessed/performed Overall Cognitive Status: History of cognitive impairments - at baseline                                 General Comments: 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      Exercises Other Exercises Other Exercises: AAROM bil hip flexion/ext with slow stretch to restraighten tight hamstrings.    General Comments        Pertinent Vitals/Pain Pain Assessment: Faces Faces Pain Scale: Hurts a little bit Pain Location: general,  ROM of legs Pain Descriptors / Indicators: Grimacing;Moaning Pain Intervention(s): Monitored during session    Home Living                      Prior Function            PT Goals (current goals can now be found in the care plan section) Acute Rehab PT Goals Patient Stated Goal: Sister "to go to rehab and then home." PT Goal Formulation: With patient/family  Time For Goal Achievement: 05/30/18 Potential to Achieve Goals: Fair Progress towards PT goals: Progressing toward goals(slow general progress)    Frequency    Min 2X/week      PT Plan Current plan remains appropriate    Co-evaluation              AM-PAC PT "6 Clicks" Daily Activity  Outcome Measure  Difficulty turning over in bed (including adjusting bedclothes, sheets and blankets)?: Unable Difficulty moving from lying on back to sitting on the side of the bed? : Unable Difficulty sitting down  on and standing up from a chair with arms (e.g., wheelchair, bedside commode, etc,.)?: Unable Help needed moving to and from a bed to chair (including a wheelchair)?: A Little Help needed walking in hospital room?: A Lot Help needed climbing 3-5 steps with a railing? : A Lot 6 Click Score: 10    End of Session   Activity Tolerance: Patient tolerated treatment well;Patient limited by fatigue Patient left: in chair;with call bell/phone within reach;with chair alarm set Nurse Communication: Mobility status PT Visit Diagnosis: Unsteadiness on feet (R26.81);Muscle weakness (generalized) (M62.81);Difficulty in walking, not elsewhere classified (R26.2)     Time: 1610-9604 PT Time Calculation (min) (ACUTE ONLY): 15 min  Charges:  $Gait Training: 8-22 mins                     05/23/2018  Jeffrey Davila, PT Acute Rehabilitation Services [phone]  (pager) [phone]  (office)   Jeffrey Davila 05/23/2018, 5:19 PM

## 2018-05-23 NOTE — Progress Notes (Signed)
CSW continuing to follow for discharge needs.  Chez Bulnes LCSW [phone]  

## 2018-05-23 NOTE — Progress Notes (Signed)
PROGRESS NOTE        PATIENT DETAILS Name: Jeffrey Davila Age: 67 y.o. Sex: male Date of Birth: 1950-10-27 Admit Date: 04/29/2018 Admitting Physician Starleen Arms, MD PCP:Uba, Reuel Boom, MD  Brief Narrative: Patient is a 68 y.o. male with history of schizophrenia/?  Cognitive dysfunction (dementia), hypertension, BPH, remote history (in the 1990s) of trauma causing small bowel injury requiring laparotomy-mostly wheelchair-bound-presented to the hospital with approximately 1 week history of intermittent vomiting, confusion for a few days prior to this hospital admission-presented with sepsis secondary to UTI, vomiting and significant constipation.  Patient was started on IV antibiotics and admitted to the hospital service,initial imaging was suggestive of constipation causing gastric distention-NG tube was placed, however upon further evaluation with a CT scan he was found  to have a small bowel obstruction.  General surgery was consulted, even with n.p.o. status/NG tube decompression-he continues to have SBO-patient's sister has refused to sign consent for surgery and wanted patient to receive several days of TNA before she consented to surgery.  Subsequently underwent laparotomy with lysis of adhesions on 10/15, postoperative course complicated by ileus and low-grade fever.  See below for further details.  Subjective:  in bed in no distress, unreliable historian but denies any headache, no chest pain, says yes to nausea and abdominal pain, says he has 2 bowel movements but he had none into the nursing staff.   Assessment/Plan:  Small bowel obstruction: Underwent laparotomy with lysis of adhesions 05/10/2018 - postoperative course complicated by ileus and likely repeat obstruction as per CT scan done on 05/20/2018 with a left lower quadrant transition point, complicated by lack of activity and baseline wheelchair/bedbound status, no surgery monitoring and following  will defer management of this problem to general surgery.    Currently being managed conservatively with bowel rest, still has significant return with NG tube on 05/22/2018 Case discussed with surgery PA on 05/22/2018 they will be following and managing this problem, continue with IV TNA and monitor, minimize narcotic use, monitor electrolytes.  Encouraged to sit up in the chair and increase activity.  Low-grade fever on 10/19: No foci of infection apparent-could have been from atelectasis-not on any antimicrobial therapy.  Blood culture on 10/20 neg so far, chest x-ray on 10/20 neg for PNA.  Remains afebrile since then.  Follow.  Sepsis secondary to complicated UTI: Sepsis pathophysiology has resolved, urine culture positive for Klebsiella-has completed a course of IV Rocephin.  Blood cultures remain negative.    Aspiration pneumonia: This occurred in the early part of his hospitalization-felt to be secondary to o SBO/vomiting with underlying frailty.  Has completed a course of antimicrobial therapy.  Continue to mobilize as much as possible.    Acute metabolic encephalopathy: Improved and back to his usual baseline.  Encephalopathy was present on initial presentation-this was felt secondary to UTI, and developing SBO.Marland Kitchen  Anemia: Suspect anemia secondary to acute illness, and IV fluid dilution.  Has been transfused a total of 3 units so far, last transfusion on 10/20.  Follow CBC periodically.  No indication of any overt blood loss at this time.   AKI: Hemodynamically mediated, resolved.  Schizophrenia: Appears stable-continue IV Depakote and olanzapine.    Hypertension: Blood pressure appears stable-continue to hold antihypertensives.   Severe malnutrition: Continue TNA-we will start low-dose nutritional supplements when diet is more stable.  Deconditioning/debility: Suspect has significant amount of debility at baseline-he is very cachectic-deconditioning/debility has worsened due to acute  illness/SBO.  PT following.  Other issues:  this MD is familiar with this patient-I took care of during the initial part of his hospital stay.  Patient's sister initially wanted guarantees with surgery, wanted transfer to [facility] (refused by [facility]).  See my prior notes.  I have reviewed notes by Dr. Marena Chancy issues with patient's sister refusing NG tube placement, wanting transfer to [facility] ([facility] by [facility] [place] [facility] [place]).  Per nursing staff-she has on occasion been very disruptive to their workflow and in the care of the patient.  I completely agree with Dr. Marena Chancy concerns whether patient's sister is the best person to be making decisions for this patient-given numerous documentations by nursing staff and by prior MDs.  Social work, risk Insurance account manager, Camera operator and palliative care following.    DVT Prophylaxis:  Prophylactic Heparin   Code Status:  Full code   Family Communication:  Sister bedside on 05/22/2018 with nursing staff present  Please  see my note from 05/17/2018.   Disposition Plan: Remain inpatient-ultimately to SNF when he is more stable.  Antimicrobial agents: Anti-infectives (From admission, onward)   Start     Dose/Rate Route Frequency Ordered Stop   05/10/18 0941  ceFAZolin (ANCEF) 2-4 GM/100ML-% IVPB    Note to Pharmacy:  [name]   : cabinet override      05/10/18 0941 05/10/18 2144   05/01/18 1215  metroNIDAZOLE (FLAGYL) IVPB 500 mg  Status:  Discontinued     500 mg 100 mL/hr over 60 Minutes Intravenous Every 8 hours 05/01/18 1201 05/06/18 1153   04/30/18 1100  cefTRIAXone (ROCEPHIN) 2 g in sodium chloride 0.9 % 100 mL IVPB  Status:  Discontinued     2 g 200 mL/hr over 30 Minutes Intravenous Every 24 hours 04/29/18 1416 05/06/18 1153   04/29/18 1430  cefTRIAXone (ROCEPHIN) 1 g in sodium chloride 0.9 % 100 mL IVPB     1 g 200 mL/hr over 30 Minutes Intravenous Every 24 hours 04/29/18 1416 04/30/18 1429    04/29/18 1130  cefTRIAXone (ROCEPHIN) 1 g in sodium chloride 0.9 % 100 mL IVPB     1 g 200 mL/hr over 30 Minutes Intravenous  Once 04/29/18 1118 04/29/18 1251      Procedures: None  CONSULTS:  None  Time spent: 25 minutes   MEDICATIONS: Scheduled Meds: . benztropine  1 mg Oral BID  . Chlorhexidine Gluconate Cloth  6 each Topical 2 times per day on Tue  . heparin  5,000 Units Subcutaneous Q8H  . lip balm  1 application Topical BID  . OLANZapine zydis  10 mg Oral BID  . polyethylene glycol  17 g Oral Daily  . polyvinyl alcohol  1 drop Both Eyes TID   Continuous Infusions: . sodium chloride 1,000 mL (05/14/18 1850)  . lactated ringers 10 mL/hr at 05/11/18 0617  . TPN ADULT (ION) 65 mL/hr at 05/22/18 1712  . valproate sodium 250 mg (05/23/18 0202)   PRN Meds:.sodium chloride, albuterol, alum & mag hydroxide-simeth, bisacodyl, diphenhydrAMINE, magic mouthwash, menthol-cetylpyridinium, metoprolol tartrate, ondansetron (ZOFRAN) IV, phenol, sodium chloride flush, traMADol   PHYSICAL EXAM: Vital signs: Vitals:   05/22/18 0643 05/22/18 2142 05/23/18 0615 05/23/18 0807  BP: 94/68 91/68 99/67  101/66  Pulse: 85 81 84 79  Resp: 18 18 18 14   Temp: 98.8 F (37.1 C) 98.6 F (37 C) 98.2 F (36.8 C) 98  F (36.7 C)  TempSrc: Oral Oral Oral Oral  SpO2: 100% 100% 100% 99%  Weight:      Height:       Filed Weights   04/29/18 0842  Weight: 49.9 kg   Body mass index is 17.75 kg/m.   Exam  Awake, No new F.N deficits,  NG tube in place, condom catheter in place [place].AT,PERRAL Supple Neck,No JVD, No cervical lymphadenopathy appriciated.  Symmetrical Chest wall movement, Good air movement bilaterally, CTAB RRR,No Gallops, Rubs or new Murmurs, No Parasternal Heave Hypoactive but +ve B.Sounds, Abd Soft, No tenderness, No organomegaly appriciated, No rebound - guarding or rigidity. No Cyanosis, Clubbing or edema, No new Rash or bruise   I have personally reviewed following labs and  imaging studies  LABORATORY DATA: CBC: Recent Labs  Lab 05/21/18 0306 05/22/18 0400 05/23/18 0347  WBC 12.4* 12.1*  --   NEUTROABS  --   --  7.1  HGB 9.3* 9.5*  --   HCT 30.3* 30.8*  --   MCV 92.9 92.8  --   PLT 579* 546*  --     Basic Metabolic Panel: Recent Labs  Lab 05/17/18 0412 05/18/18 0441 05/19/18 0408 05/20/18 0402 05/21/18 0306 05/22/18 0400 05/23/18 0347  NA 136 137 137 137 136 138 135  K 4.1 4.2 4.3 4.5 4.7 4.9 4.5  CL 107 107 107 105 105 102 105  CO2 23 23 23 25 24 24 25   GLUCOSE 100* 104* 90 107* 108* 99 93  BUN 22 23 22  25* 25* 33* 37*  CREATININE 0.61 0.61 0.66 0.70 0.69 0.78 0.72  CALCIUM 8.7* 8.6* 8.8* 9.0 9.3 9.7 9.4  MG 2.1 2.0 1.9 2.1 2.2 2.3 2.1  PHOS 3.5 3.9 3.5  --   --   --  4.3    GFR: Estimated Creatinine Clearance: 64.1 mL/min (by C-G formula based on SCr of 0.72 mg/dL).  Liver Function Tests: Recent Labs  Lab 05/19/18 0408 05/20/18 0402 05/21/18 0306 05/22/18 0400 05/23/18 0347  AST 17 12* 10* 10* 10*  ALT 15 14 11 10 8   ALKPHOS 148* 134* 129* 127* 114  BILITOT 0.2* 0.3 0.2* 0.2* 0.3  PROT 6.3* 6.6 6.7 6.9 7.1  ALBUMIN 2.3* 2.5* 2.5* 2.6* 2.7*   No results for input(s): LIPASE, AMYLASE in the last 168 hours. No results for input(s): AMMONIA in the last 168 hours.  Coagulation Profile: No results for input(s): INR, PROTIME in the last 168 hours.  Cardiac Enzymes: No results for input(s): CKTOTAL, CKMB, CKMBINDEX, TROPONINI in the last 168 hours.  BNP (last 3 results) No results for input(s): PROBNP in the last 8760 hours.  HbA1C: No results for input(s): HGBA1C in the last 72 hours.  CBG: Recent Labs  Lab 05/22/18 0807 05/22/18 1338 05/22/18 1807 05/22/18 2140 05/23/18 0809  GLUCAP 106* 98 108* 104* 107*    Lipid Profile: Recent Labs    05/23/18 0347  TRIG 53    Thyroid Function Tests: No results for input(s): TSH, T4TOTAL, FREET4, T3FREE, THYROIDAB in the last 72 hours.  Anemia Panel: No results  for input(s): VITAMINB12, FOLATE, FERRITIN, TIBC, IRON, RETICCTPCT in the last 72 hours.  Urine analysis:    Component Value Date/Time   COLORURINE YELLOW 05/15/2018 1109   APPEARANCEUR CLEAR 05/15/2018 1109   LABSPEC 1.013 05/15/2018 1109   PHURINE 7.0 05/15/2018 1109   GLUCOSEU NEGATIVE 05/15/2018 1109   HGBUR SMALL (A) 05/15/2018 1109   BILIRUBINUR NEGATIVE 05/15/2018 1109   KETONESUR NEGATIVE  05/15/2018 1109   PROTEINUR NEGATIVE 05/15/2018 1109   UROBILINOGEN 1.0 06/08/2010 1523   NITRITE NEGATIVE 05/15/2018 1109   LEUKOCYTESUR TRACE (A) 05/15/2018 1109    Sepsis Labs: Lactic Acid, Venous    Component Value Date/Time   LATICACIDVEN 1.07 04/29/2018 1334    MICROBIOLOGY: Recent Results (from the past 240 hour(s))  Culture, blood (routine x 2)     Status: None   Collection Time: 05/15/18  3:15 PM  Result Value Ref Range Status   Specimen Description BLOOD SITE NOT SPECIFIED  Final   Special Requests   Final    BOTTLES DRAWN AEROBIC ONLY Blood Culture results may not be optimal due to an inadequate volume of blood received in culture bottles   Culture   Final    NO GROWTH 5 DAYS Performed at [facility], [address], [place], [place] [address]    Report Status 05/20/2018 FINAL  Final  Culture, blood (routine x 2)     Status: None   Collection Time: 05/15/18  3:22 PM  Result Value Ref Range Status   Specimen Description BLOOD SITE NOT SPECIFIED  Final   Special Requests   Final    BOTTLES DRAWN AEROBIC ONLY Blood Culture results may not be optimal due to an inadequate volume of blood received in culture bottles   Culture   Final    NO GROWTH 5 DAYS Performed at [facility], [address] [place], [place] [address]    Report Status 05/20/2018 FINAL  Final    RADIOLOGY STUDIES/RESULTS: Ct Abdomen Pelvis Wo Contrast  Result Date: 04/30/2018 CLINICAL DATA:  Nausea, bilious vomiting EXAM: CT ABDOMEN AND PELVIS WITHOUT CONTRAST TECHNIQUE:  Multidetector CT imaging of the abdomen and pelvis was performed following the standard protocol without IV contrast. Sagittal and coronal MPR images reconstructed from axial data set. Oral contrast was not administered. COMPARISON:  None FINDINGS: Lower chest: Emphysematous changes with BILATERAL lower lobe infiltrates consistent with either pneumonia or aspiration. Hepatobiliary: Liver unremarkable. Gallbladder not well visualized due to streak artifacts, grossly unremarkable. Pancreas: Normal appearance Spleen: Normal appearance Adrenals/Urinary Tract: Question adrenal thickening bilaterally. No obvious renal mass or hydronephrosis. Ureters not visualized. Foley catheter decompresses urinary bladder. Stomach/Bowel: Nasogastric tube in stomach. Low-attenuation fluid distends stomach and multiple proximal small bowel loops, with small bowel loops up to 4.1 cm diameter. Distal small bowel loops and colon are decompressed. Scattered stool throughout colon. Findings likely represent mid small bowel obstruction. Appendix not visualized. Suspected rectal wall thickening. Vascular/Lymphatic: Atherosclerotic calcification aorta. Aorta normal caliber. Reproductive: Minimal prostatic enlargement and scattered prostatic calcifications. Other: No definite free air or free fluid. Nonspecific stranding of presacral fat. Musculoskeletal: No acute osseous findings. IMPRESSION: Dilated proximal and decompressed distal small bowel loops compatible with small bowel obstruction, likely in the mid small bowel. Due to lack of fat planes, streak artifacts, and lack of contrast, unable to localize site of obstruction. No evidence of perforation/free air. Question rectal wall thickening, recommend correlation with proctoscopy. Emphysematous changes with bibasilar pulmonary infiltrates either representing pneumonia or aspiration. Electronically Signed   By: Ulyses Southward M.D.   On: 04/30/2018 19:38   Dg Abd 1 View  Result Date:  05/20/2018 CLINICAL DATA:  NG tube adjustment EXAM: ABDOMEN - 1 VIEW COMPARISON:  05/20/2018 FINDINGS: Esophageal tube tip and side-port project over gastric fundus, somewhat abrupt appearing curvature at the distal tip with persistent gaseous dilatation. Continued marked gaseous enlargement of bowel in the left abdomen up  to 5 cm. Moderate stool in the colon. Residual contrast within the bladder. IMPRESSION: 1. Esophageal tube tip and side port overlie the gastric fundus, somewhat abrupt curvature of the tip, could be due to mild kinking. 2. Persistent dilatation of stomach and left abdominal small bowel consistent with a bowel obstruction. Electronically Signed   By: Jasmine Pang M.D.   On: 05/20/2018 18:37   Dg Abd 1 View  Result Date: 05/20/2018 CLINICAL DATA:  NG tube placement. EXAM: ABDOMEN - 1 VIEW COMPARISON:  CT abdomen pelvis from same day. FINDINGS: Tip of the NG tube in the stomach with the proximal side port at the gastroesophageal junction. Dilated loops of small bowel in the left abdomen are unchanged. Contrast is seen within the renal collecting systems and bladder. No acute osseous abnormality. IMPRESSION: 1. NG tube tip in the stomach with the proximal side port at the gastroesophageal junction. Consider advancing 2-3 cm. 2. Unchanged small bowel obstruction. Electronically Signed   By: Obie Dredge M.D.   On: 05/20/2018 16:47   Dg Abd 1 View  Result Date: 05/16/2018 CLINICAL DATA:  Feeding tube advanced today. EXAM: ABDOMEN - 1 VIEW COMPARISON:  Plain films of the abdomen dated 05/15/2018 and 05/14/2018. FINDINGS: Enteric tube is coiled in the stomach with tip at the level of the gastric fundus/cardia. Distended gas-filled loops of large and small bowel are again appreciated throughout the abdomen and upper pelvis. IMPRESSION: 1. Feeding tube is coiled in the stomach with tip at the level of the gastric fundus/cardia. 2. Persistent gaseous distention of both large and small bowel  loops throughout the abdomen and pelvis, compatible with previous reports of obstruction or ileus. Electronically Signed   By: Bary Richard M.D.   On: 05/16/2018 20:26   Dg Abd 1 View  Result Date: 05/03/2018 CLINICAL DATA:  Small bowel obstruction EXAM: ABDOMEN - 1 VIEW COMPARISON:  05/02/2018 FINDINGS: NG tube is in the stomach. Gaseous distention of the stomach and left abdominal small bowel loops. Gas and stool within nondistended colon. No free air or organomegaly. IMPRESSION: Gaseous distention of stomach and left abdominal small bowel loops likely reflecting small bowel obstruction. NG tube is in the stomach. Electronically Signed   By: Charlett Nose M.D.   On: 05/03/2018 10:05   Dg Abd 1 View  Result Date: 05/02/2018 CLINICAL DATA:  Small bowel obstruction EXAM: ABDOMEN - 1 VIEW COMPARISON:  May 01, 2018 abdominal radiograph and CT abdomen and pelvis April 30, 2018 FINDINGS: Nasogastric tube no longer present. There is slightly less bowel dilatation in the left upper quadrant compared to 1 day prior. There is moderate air in the stomach. No bowel dilatation elsewhere. No air-fluid levels. No free air. There is moderate stool in the colon. There is atelectatic change in the right lung base. IMPRESSION: Nasogastric tube no longer appreciable. Less small bowel dilatation in the left upper quadrant. Question resolving bowel obstruction. No free air evident. Electronically Signed   By: Bretta Bang III M.D.   On: 05/02/2018 14:20   Ct Head Wo Contrast  Result Date: 04/29/2018 CLINICAL DATA:  Altered level of consciousness increased over last 2 days, 2 episodes of nausea and vomiting, history of prior brain injury, dementia, former smoker, hypertension EXAM: CT HEAD WITHOUT CONTRAST TECHNIQUE: Contiguous axial images were obtained from the base of the skull through the vertex without intravenous contrast. COMPARISON:  06/08/2010 FINDINGS: Brain: Generalized atrophy. Normal ventricular  morphology. No midline shift or mass effect. Small vessel chronic  ischemic changes of deep cerebral white matter. 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 No intracranial hemorrhage, mass lesion, evidence of acute infarction, or extra-axial fluid collection. Vascular: Minimal atherosclerotic calcification of internal carotid arteries at skull base Skull: Intact Sinuses/Orbits: Clear Other: N/A IMPRESSION: Atrophy with small vessel chronic ischemic changes of deep cerebral white matter. 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 No acute intracranial abnormalities. Electronically Signed   By: Ulyses Southward M.D.   On: 04/29/2018 09:58   Ct Abdomen Pelvis W Contrast  Result Date: 05/20/2018 CLINICAL DATA:  Nausea and vomiting. 10 days status post laparoscopic lysis of adhesions and bowel resection. EXAM: CT ABDOMEN AND PELVIS WITH CONTRAST TECHNIQUE: Multidetector CT imaging of the abdomen and pelvis was performed using the standard protocol following bolus administration of intravenous contrast. CONTRAST:  ISOVUE-300 IOPAMIDOL (ISOVUE-300) INJECTION 61% COMPARISON:  04/30/2018 FINDINGS: Lower Chest: Decreased airspace disease seen in both lower lobes, likely due to resolving pneumonia. Hepatobiliary: A few tiny left hepatic lobe cysts are seen. No hepatic masses identified. Tiny calcified gallstones or layering sludge noted, however there is no evidence of cholecystitis or biliary ductal dilatation. Pancreas:  No mass or inflammatory changes. Spleen: Within normal limits in size and appearance. Adrenals/Urinary Tract: No masses identified. No evidence of hydronephrosis. Stomach/Bowel: Small amount of free air is seen, likely postoperative in etiology. No abscess identified. Multiple moderately dilated fluid-filled small bowel loops are seen in the left abdomen, with transition to nondilated small bowel in the left lower quadrant in area of surgical staples. This is consistent with a  small-bowel obstruction. No mass or inflammatory process identified. Moderate amount of stool again seen in the right colon. Vascular/Lymphatic: No pathologically enlarged lymph nodes. No abdominal aortic aneurysm. Reproductive: Mild-to-moderate enlargement of prostate gland shows no significant change. Other: Subcutaneous emphysema is seen in the left anterior abdominal wall, likely postoperative in etiology. Musculoskeletal:  No suspicious bone lesions identified. IMPRESSION: Small bowel obstruction, with transition point in left lower quadrant. This may be due to adhesion, as no obstructing mass or inflammatory process identified. Small amount of postop intraperitoneal air and subcutaneous emphysema. No evidence of abscess. Cholelithiasis.  No radiographic evidence of cholecystitis. Decreased bilateral lower lobe airspace disease, consistent with resolving pneumonia or inflammatory process. Electronically Signed   By: Myles Rosenthal M.D.   On: 05/20/2018 13:41   Dg Chest Port 1 View  Result Date: 05/15/2018 CLINICAL DATA:  Shortness of breath. Patient 5 days postop bowel resection with lysis of adhesions. EXAM: PORTABLE CHEST 1 VIEW COMPARISON:  05/11/2018 FINDINGS: Right-sided PICC line with tip over the SVC. Lungs are adequately inflated with subtle left retrocardiac opacification which may be due to vascular crowding or atelectasis and less likely infection. Remainder of the lungs are clear. Cardiomediastinal silhouette is within normal. Persistent evidence of free peritoneal air compatible with patient's recent surgery. IMPRESSION: Mild left base opacification likely atelectasis or vascular crowding and less likely infection. Persistent free peritoneal air compatible recent postop state. Right-sided PICC line with tip over the SVC. Electronically Signed   By: Elberta Fortis M.D.   On: 05/15/2018 09:38   Dg Chest Port 1 View  Result Date: 05/11/2018 CLINICAL DATA:  Shortness of breath, hypertension,  pneumonia, dementia, former smoker EXAM: PORTABLE CHEST 1 VIEW COMPARISON:  Portable exam 0945 hours compared to 05/09/2018 FINDINGS: Nasogastric tube extends into stomach. RIGHT arm PICC line tip projects over SVC. Normal heart size and pulmonary vascularity. Elongation of thoracic aorta. Lungs appear  emphysematous but clear. No pleural effusion or pneumothorax. Bones demineralized. Free air identified under the hemidiaphragms bilaterally; EHR indicates patient underwent a laparoscopic procedure on 05/10/2018, small-bowel resection and lysis of adhesions for small-bowel obstruction prior operative note. IMPRESSION: COPD changes without infiltrate. Free air consistent with abdominal surgery 1 day ago. Electronically Signed   By: Ulyses Southward M.D.   On: 05/11/2018 10:21   Dg Chest Port 1 View  Result Date: 05/09/2018 CLINICAL DATA:  Cough EXAM: PORTABLE CHEST 1 VIEW COMPARISON:  05/04/2018 FINDINGS: NG tube is stable. Right upper extremity PICC placed. Tip is at the cavoatrial junction. Lungs remain hyperaerated and clear. Small right pleural effusion is stable. No pneumothorax. Aorta remains somewhat prominent due to tortuosity and rotation of the thorax. IMPRESSION: New right upper extremity PICC with its tip at the cavoatrial junction. No active cardiopulmonary disease. Electronically Signed   By: Jolaine Click M.D.   On: 05/09/2018 07:37   Dg Chest Port 1 View  Result Date: 04/29/2018 CLINICAL DATA:  Altered mental status EXAM: PORTABLE CHEST 1 VIEW COMPARISON:  April 12, 2018 FINDINGS: There is mild scarring in the lateral right base and in the medial right lower lung region. There is no edema or consolidation. The heart size and pulmonary vascularity are normal. No adenopathy. There is a recent appearing fracture of the posterior right seventh rib with alignment essentially anatomic. An older fracture of the posterior right eighth rib is noted. No pneumothorax. IMPRESSION: Recent appearing fracture  posterior right seventh rib. Older fracture posterior right eighth rib noted. Areas of mild scarring noted on the right. No edema or consolidation. Stable cardiac silhouette. Electronically Signed   By: Bretta Bang III M.D.   On: 04/29/2018 08:46   Dg Chest Port 1v Same Day  Result Date: 05/01/2018 CLINICAL DATA:  history of schizophrenia, hypertension, BPH-mostly wheelchair-bound-presented to the hospital with approximately 1 week history of intermittent vomiting, confusion for a few days prior to this hospital admission-presented with sepsis secondary to UTI, and significant constipation. C/o SOB EXAM: PORTABLE CHEST - 1 VIEW SAME DAY COMPARISON:  04/29/2018 FINDINGS: Lungs are hyperinflated. Subsegmental atelectasis or early infiltrate at the left lung base, new since previous. Right lung clear. Heart size normal.  Tortuous ectatic thoracic aorta. No effusion. No pneumothorax. Right anterolateral fourth rib fracture, age indeterminate. Nasogastric tube extends to the stomach. IMPRESSION: 1. New patchy infiltrate or subsegmental atelectasis at the left lung base. 2. Right fourth rib fracture without pneumothorax. 3. Nasogastric tube placement to the stomach. Electronically Signed   By: Corlis Leak M.D.   On: 05/01/2018 08:24   Dg Abd 2 Views  Result Date: 05/05/2018 CLINICAL DATA:  Vomiting.  History of right lumpectomy. EXAM: ABDOMEN - 2 VIEW COMPARISON:  05/04/2018 FINDINGS: Improve dilation of small bowel loops with residual borderline gas-filled distended small bowel loops in the left upper quadrant of the abdomen. Normal colonic bowel gas pattern. No evidence of free intra-abdominal gas. Normal cardiac silhouette. Tortuosity of the thoracic aorta. Right pleural reflection versus small pleural effusion. No lobar airspace consolidation. Enteric catheter tip within the expected location the gastric body, side hole at the GE junction level. Right PICC line terminates in the expected location of the  cavoatrial junction. IMPRESSION: Improved appearance of small-bowel obstruction. Enteric catheter with side hole at the level of the GE junction. Advancement may be considered. Possible small right pleural effusion. Electronically Signed   By: Ted Mcalpine M.D.   On: 05/05/2018 12:51   Dg  Abd Acute W/chest  Result Date: 05/04/2018 CLINICAL DATA:  Follow-up small bowel obstruction. EXAM: DG ABDOMEN ACUTE W/ 1V CHEST COMPARISON:  Abdominal x-rays 05/04/1999 19 dating back to 04/29/2018. CT abdomen and pelvis 04/30/2018. Chest x-rays 05/01/2018, 04/12/2018. FINDINGS: Nasogastric tube tip in the fundus of the stomach. Persistent marked gaseous distension of the jejunum in the LEFT UPPER QUADRANT, slowly progressive over the past 3 days, with air-fluid levels on the LATERAL decubitus image. No evidence of free intraperitoneal air. Normal caliber colon with moderate stool burden. Calcified uterine fibroid in the pelvic midline. Cardiac silhouette normal in size, unchanged. Thoracic aorta tortuous and atherosclerotic. Hilar and mediastinal contours otherwise unremarkable. Interval improvement in aeration in the LEFT lung base, with only minimal patchy opacities persisting. Lungs otherwise clear. Emphysematous changes in both lungs with scarring in the RIGHT mid lung, unchanged. Pleuroparenchymal scarring at the RIGHT lung base which accounts for the blunting of the costophrenic angle, unchanged dating back to the original 04/12/2018 examination. IMPRESSION: 1. Persistent partial small bowel obstruction which has slowly worsened over the past 3 days. 2. No evidence of free intraperitoneal air. 3. Improved aeration in the LEFT LOWER LOBE, with only mild atelectasis and/or pneumonia persisting. Electronically Signed   By: Hulan Saas M.D.   On: 05/04/2018 09:28   Dg Abd Portable 1v-small Bowel Obstruction Protocol-initial, 8 Hr Delay  Result Date: 05/21/2018 CLINICAL DATA:  Small-bowel obstruction.   Follow-up exam. EXAM: PORTABLE ABDOMEN - 1 VIEW COMPARISON:  05/20/2018 FINDINGS: Significant improvement in small bowel dilation is noted since the prior study. The small bowel obstruction has essentially resolved radiographically. Mild generalized increased stool burden in the colon, unchanged from the previous exam. Midline abdominal wall surgical staples are stable. Nasogastric tube is well positioned, tip in the mid stomach. IMPRESSION: 1. Significant interval improvement from the previous day's exam. Small-bowel obstruction appears resolved radiographically. Electronically Signed   By: Amie Portland M.D.   On: 05/21/2018 13:12   Dg Abd Portable 1v  Result Date: 05/17/2018 CLINICAL DATA:  Small-bowel obstruction. EXAM: PORTABLE ABDOMEN - 1 VIEW COMPARISON:  05/16/2018.  05/15/2018.  CT 04/30/2018. FINDINGS: Surgical staples noted over the abdomen. NG tube noted with its tip in the stomach. Distended loops of small and large bowel are again noted. Similar findings noted on prior exam. No free air. Degenerative change in osteopenia lumbar spine and both hips. Prostate calcifications are noted. Aortoiliac atherosclerotic vascular calcification. Diffuse osteopenia degenerative changes lumbar spine and both hips. Mild blunting of the right costophrenic angle again noted suggesting scarring and/or tiny effusion. IMPRESSION: NG tube noted with its tip in the stomach. Dilated loops of small and large bowel are again noted without significant interim change. No free air identified. Electronically Signed   By: Maisie Fus  Register   On: 05/17/2018 07:32   Dg Abd Portable 1v  Result Date: 05/15/2018 CLINICAL DATA:  NG tube placement EXAM: PORTABLE ABDOMEN - 1 VIEW COMPARISON:  05/15/2018 FINDINGS: NG tube is in placed into the fundus of the stomach. Continued gaseous distention of visualized upper abdominal bowel loops. IMPRESSION: NG tube in the fundus of the stomach Electronically Signed   By: Charlett Nose M.D.    On: 05/15/2018 20:11   Dg Abd Portable 1v  Result Date: 05/15/2018 CLINICAL DATA:  Small bowel obstruction. EXAM: PORTABLE ABDOMEN - 1 VIEW COMPARISON:  One-view abdomen 05/14/2018 FINDINGS: Patient's retained left.  Heart size normal.  Lung bases are clear. Extend loops of large and small bowel are stable. There  is no free air or pneumatosis. Surgical clips are noted. IMPRESSION: 1. Similar appearance of distended large and small bowel consistent with obstruction or ileus. Electronically Signed   By: Marin Roberts M.D.   On: 05/15/2018 08:10   Dg Abd Portable 1v  Result Date: 05/14/2018 CLINICAL DATA:  Abdominal pain EXAM: PORTABLE ABDOMEN - 1 VIEW COMPARISON:  05/13/2018 FINDINGS: Interval NG tube removal. Some interval improvement in gaseous distention of the bowel. Stool and air throughout the transverse colon. Postop staples again noted. Air in the rectum. No significant interval change. Degenerative changes of the spine. Basilar chronic interstitial changes and atelectasis. IMPRESSION: NG tube removed. Stable nonspecific bowel gas pattern, suspect resolving obstruction or ileus. Little interval change. Electronically Signed   By: Judie Petit.  Shick M.D.   On: 05/14/2018 10:23   Dg Abd Portable 1v  Result Date: 05/13/2018 CLINICAL DATA:  Abdominal pain, check NG placement EXAM: PORTABLE ABDOMEN - 1 VIEW COMPARISON:  05/09/2018 FINDINGS: Nasogastric catheter is noted with the tip in the stomach. Proximal side port lies in the distal esophagus. This is stable from the prior exam. Scattered large and small bowel gas is noted. Postsurgical changes are now seen. Mild small bowel dilatation is noted which may be related to a postoperative ileus. IMPRESSION: Mild postoperative ileus. Nasogastric catheter as described. Electronically Signed   By: Alcide Clever M.D.   On: 05/13/2018 09:56   Dg Abd Portable 1v  Result Date: 05/09/2018 CLINICAL DATA:  Small-bowel obstruction EXAM: PORTABLE ABDOMEN - 1  VIEW COMPARISON:  05/08/2018; 05/06/2018; 05/05/2018 FINDINGS: Re-demonstrated gaseous distention of several loops of small bowel with index loop of small bowel within the left mid hemiabdomen measuring 3.6 cm in diameter. Moderate colonic stool burden. No supine evidence of pneumoperitoneum. No pneumatosis or portal venous gas. Presumed calcified fibroid overlies the left hemipelvis. Stable position of support apparatus. No acute osseus abnormalities. IMPRESSION: No change to slight improvement in suspected small-bowel obstruction. Electronically Signed   By: Simonne Come M.D.   On: 05/09/2018 07:47   Dg Abd Portable 1v  Result Date: 05/08/2018 CLINICAL DATA:  Follow up small bowel obstruction EXAM: PORTABLE ABDOMEN - 1 VIEW COMPARISON:  05/06/2018 FINDINGS: Scattered large and small bowel gas is noted. Multiple dilated loops of small bowel are again identified and stable in appearance. Nasogastric catheter is again noted within the stomach. No free air is seen. Degenerative changes of the lumbar spine are noted. IMPRESSION: Persistent small bowel dilatation. No new focal abnormality is noted. Electronically Signed   By: Alcide Clever M.D.   On: 05/08/2018 08:36   Dg Abd Portable 1v  Result Date: 05/06/2018 CLINICAL DATA:  Small bowel obstruction EXAM: PORTABLE ABDOMEN - 1 VIEW COMPARISON:  Portable exam at 0628 hours compared to 05/05/2018 FINDINGS: Stool scattered throughout colon. Persistent dilatation of small bowel loops in the LEFT mid abdomen up to 4.9 cm diameter. No bowel wall thickening. Bones demineralized. No definite renal calcifications. IMPRESSION: Persistent small bowel dilatation consistent with small bowel obstruction. Little interval change. Electronically Signed   By: Ulyses Southward M.D.   On: 05/06/2018 10:21   Dg Abd Portable 1v-small Bowel Obstruction Protocol-initial, 8 Hr Delay  Result Date: 05/04/2018 CLINICAL DATA:  Small bowel obstruction protocol. 8 hour delay. EXAM: PORTABLE  ABDOMEN - 1 VIEW COMPARISON:  05/03/2018 FINDINGS: Gaseous distention of left upper quadrant small bowel. Enteric tube with tip projected over the mid abdomen consistent with location in the upper stomach. Contrast material is demonstrated in the  dilated small bowel. No contrast material is identified in the colon. This is suggestive of high-grade obstruction. Degenerative changes in the spine. IMPRESSION: Contrast material is demonstrated in the dilated small bowel but not in the colon consistent with high-grade obstruction. Electronically Signed   By: Burman Nieves M.D.   On: 05/04/2018 01:55   Dg Abd Portable 1v-small Bowel Obstruction Protocol-initial, 8 Hr Delay  Result Date: 05/01/2018 CLINICAL DATA:  Small bowel obstruction. EXAM: PORTABLE ABDOMEN - 1 VIEW COMPARISON:  Radiograph earlier this day at 1109 hour, CT yesterday. FINDINGS: Enteric tube in place with tip in the stomach, side-port just beyond the gastroesophageal junction. Improving small bowel dilatation in the left abdomen. Moderate stool in the right and transverse colon. No evidence of free air. Pelvic calcification may be stone in the urinary bladder or exophytic prostate calcification. IMPRESSION: Improving small bowel dilatation in the left abdomen since radiograph earlier this day. Electronically Signed   By: Narda Rutherford M.D.   On: 05/01/2018 21:14   Dg Abd Portable 1v-small Bowel Protocol-position Verification  Result Date: 05/01/2018 CLINICAL DATA:  Nasogastric tube placement EXAM: PORTABLE ABDOMEN - 1 VIEW COMPARISON:  Portable exam 1109 hours compared to 04/30/2018 FINDINGS: Tip of nasogastric tube projects over mid stomach. Dilated small bowel loops are seen in the LEFT mid abdomen. Increased stool in RIGHT colon. Lung bases appear emphysematous with subsegmental atelectasis at LEFT base. Bones demineralized. IMPRESSION: Tip of nasogastric tube projects over mid stomach. Small bowel dilatation in LEFT mid abdomen with  increased stool in RIGHT colon. Electronically Signed   By: Ulyses Southward M.D.   On: 05/01/2018 11:22   Dg Abd Portable 1v  Result Date: 04/30/2018 CLINICAL DATA:  67 year old male with vomiting. EXAM: PORTABLE ABDOMEN - 1 VIEW COMPARISON:  04/29/2018 abdominal radiographs. FINDINGS: Portable AP semi upright and supine views at 0847 hours. Enteric tube has been placed and the stomach now appears decompressed. NG tube side hole at the gastric body. Non obstructed bowel gas pattern. Moderate volume of retained stool redemonstrated in the transverse colon and distal sigmoid/rectum. Negative visible lung bases. No pneumoperitoneum. Stable abdominal and pelvic visceral contours. Dystrophic calcification in the pelvis. Stable pelvic catheter, likely a Foley. No acute osseous abnormality identified. IMPRESSION: 1. NG tube in place with resolved gastric distention. 2. Non obstructed bowel gas pattern with moderate volume of retained stool. Electronically Signed   By: Odessa Fleming M.D.   On: 04/30/2018 09:06   Dg Abd Portable 1v  Result Date: 04/29/2018 CLINICAL DATA:  Nausea and vomiting EXAM: PORTABLE ABDOMEN - 1 VIEW COMPARISON:  None. FINDINGS: Gaseous distention of the stomach. Formed stool throughout the colon. No concerning mass effect or calcification. IMPRESSION: Prominent gaseous distension of the stomach. Constipated appearance. Electronically Signed   By: Marnee Spring M.D.   On: 04/29/2018 14:44   [place] Ekg Site Rite  Result Date: 05/04/2018 If Site Rite image not attached, placement could not be confirmed due to current cardiac rhythm.    LOS: 24 days   Signature  Susa Raring M.D on 05/23/2018 at 9:20 AM   -  To page go to [email or web address] - password [facility]

## 2018-05-23 NOTE — Progress Notes (Addendum)
[facility] Progress Note  13 Days Post-Op  Subjective: CC:  Reports feeling ok today - abdominal pain slightly better. States he is having BMs but according to epic documentation he is not.   Objective: Vital signs in last 24 hours: Temp:  [98 F (36.7 C)-98.6 F (37 C)] 98 F (36.7 C) (10/28 0807) Pulse Rate:  [79-84] 79 (10/28 0807) Resp:  [14-18] 14 (10/28 0807) BP: (91-101)/(66-68) 101/66 (10/28 0807) SpO2:  [99 %-100 %] 99 % (10/28 0807) Last BM Date: 05/20/18  Intake/Output from previous day: 10/27 0701 - 10/28 0700 In: 930 [I.V.:780; IV Piggyback:150] Out: 1800 [Urine:800; Emesis/NG output:1000] Intake/Output this shift: Total I/O In: -  Out: 300 [Urine:300]  PE: Gen:  Alert, NAD, pleasant Pulm:  Normal effort Abd: Soft, mild tenderness - improved compared to previous exams, no peritonitis, incision c/d/i w/ staples, present but hypoactive BS Skin: warm and dry, no rashes  Psych: A&Ox3   Lab Results:  Recent Labs    05/21/18 0306 05/22/18 0400  WBC 12.4* 12.1*  HGB 9.3* 9.5*  HCT 30.3* 30.8*  PLT 579* 546*   BMET Recent Labs    05/22/18 0400 05/23/18 0347  NA 138 135  K 4.9 4.5  CL 102 105  CO2 24 25  GLUCOSE 99 93  BUN 33* 37*  CREATININE 0.78 0.72  CALCIUM 9.7 9.4   PT/INR No results for input(s): LABPROT, INR in the last 72 hours. CMP     Component Value Date/Time   NA 135 05/23/2018 0347   K 4.5 05/23/2018 0347   CL 105 05/23/2018 0347   CO2 25 05/23/2018 0347   GLUCOSE 93 05/23/2018 0347   BUN 37 (H) 05/23/2018 0347   CREATININE 0.72 05/23/2018 0347   CALCIUM 9.4 05/23/2018 0347   PROT 7.1 05/23/2018 0347   ALBUMIN 2.7 (L) 05/23/2018 0347   AST 10 (L) 05/23/2018 0347   ALT 8 05/23/2018 0347   ALKPHOS 114 05/23/2018 0347   BILITOT 0.3 05/23/2018 0347   GFRNONAA >60 05/23/2018 0347   GFRAA >60 05/23/2018 0347   Lipase     Component Value Date/Time   LIPASE 204 (H) 04/29/2018 0904        Studies/Results: Dg Abd Portable 1v-small Bowel Obstruction Protocol-initial, 8 Hr Delay  Result Date: 05/21/2018 CLINICAL DATA:  Small-bowel obstruction.  Follow-up exam. EXAM: PORTABLE ABDOMEN - 1 VIEW COMPARISON:  05/20/2018 FINDINGS: Significant improvement in small bowel dilation is noted since the prior study. The small bowel obstruction has essentially resolved radiographically. Mild generalized increased stool burden in the colon, unchanged from the previous exam. Midline abdominal wall surgical staples are stable. Nasogastric tube is well positioned, tip in the mid stomach. IMPRESSION: 1. Significant interval improvement from the previous day's exam. Small-bowel obstruction appears resolved radiographically. Electronically Signed   By: Amie Portland M.D.   On: 05/21/2018 13:12    Anti-infectives: Anti-infectives (From admission, onward)   Start     Dose/Rate Route Frequency Ordered Stop   05/10/18 0941  ceFAZolin (ANCEF) 2-4 GM/100ML-% IVPB    Note to Pharmacy:  [name]   : cabinet override      05/10/18 0941 05/10/18 2144   05/01/18 1215  metroNIDAZOLE (FLAGYL) IVPB 500 mg  Status:  Discontinued     500 mg 100 mL/hr over 60 Minutes Intravenous Every 8 hours 05/01/18 1201 05/06/18 1153   04/30/18 1100  cefTRIAXone (ROCEPHIN) 2 g in sodium chloride 0.9 % 100 mL IVPB  Status:  Discontinued  2 g 200 mL/hr over 30 Minutes Intravenous Every 24 hours 04/29/18 1416 05/06/18 1153   04/29/18 1430  cefTRIAXone (ROCEPHIN) 1 g in sodium chloride 0.9 % 100 mL IVPB     1 g 200 mL/hr over 30 Minutes Intravenous Every 24 hours 04/29/18 1416 04/30/18 1429   04/29/18 1130  cefTRIAXone (ROCEPHIN) 1 g in sodium chloride 0.9 % 100 mL IVPB     1 g 200 mL/hr over 30 Minutes Intravenous  Once 04/29/18 1118 04/29/18 1251     Assessment/Plan Hx of prior lung surgery Acute metabolic encephalopathy Anemia- transfused 10/14 - 6.9/21 today Dr. 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 - SBO vs ileus vs dysmotility. Continue NG to LIWS. minimize narcotics and keep electrolytes repleted. - check AXR in AM, if stable then will just follow the patient clinically, I do not think serial AXR's are necessary -OOB to chair!   -cont TNA for nutritional support at this time, once tolerating more to eat, will start to wean - D/C staples tomorrow POD#14 (10/29)  ID -rocephin 10/4>>10/11, flagyl 10/6>>10/11; WBC 12 on 10/27 - stable FEN -IVF,clear liquids, TNA  VTE -SCDs, SQ heparin Foley -condom cath   LOS: 24 days    Hosie Spangle, [facility] Surgery Pager: [phone]

## 2018-05-23 NOTE — Progress Notes (Signed)
PHARMACY - ADULT TOTAL PARENTERAL NUTRITION CONSULT NOTE   Pharmacy Consult for TPN Indication: SBO  Patient Measurements: Height: 5\' 6"  (167.6 cm) Weight: 110 lb (49.9 kg) IBW/kg (Calculated) : 63.8 TPN AdjBW (KG): 49.9 Body mass index is 17.75 kg/m.  Assessment:  Jeffrey Davila admitted on 10/4 with sepsis. He was found to have pneumonia and a possible SBO on CT on 10/5. Pt has a history of SBO s/p colectomy in 1993 (history (in the 1990s) of trauma causing small bowel injury requiring laparotomy). An NG tube was placed with improving small bowel dilatation but patient had 3-4 episodes of vomiting overnight on 10/7-10/8 and the NG tube had to be replaced due to incorrect placement. Pharmacy now consulted to manage TPN due to worsening SBO and concern for adhesions that require surgery.  Actual body is significantly below ideal body weight.  Of note, patient is extremely cachectic. Patient hx unreliable due to mental status.  GI: Albumin low at 2.7, Pre-albumin up to 30.1. S/p ex-lap with LOA, SBR and anastomosis on 10/15. NGT back in place with of output yesterday. +BS, last BM 10/25. Last abdominal xray shows resolved ileus. Endo: No hx DM - CBGs controlled, SSI d/c'd 10/14 Lytes: wnl today. Renal: SCr stable, UOP low but stable, LR at [facility] Pulm: RA Cards: VSS.  Hepatobil: LFTs / Tbili / Trig wnl Neuro: Schizophrenia. Dementia. Benztropine (held since 10/20), Zyprexa bid, IV valproate - Can't really make appropriate decisions ID: s/p abx for Klebsiella UTI and aspiration PNA. Afebrile, WBC WNL Heme: SQ heparin. Plts stable. Transfuse 10/20 > Hg low stable  TPN Access: double lumen PICC placed 10/10 TPN start date: 05/05/18  Nutritional Goals (per RD rec on 10/21): 1550-1750kCal, 85-100 gm protein, >1.5 L fluid per day  Current Nutrition:  TPN NPO  Plan:  Continue TPN at 65 mL/hr This TPN provides 94 g of protein, 218 g of dextrose, and 47 g of lipids for a total of 1,585 kCals  per day, meeting 100% of patient needs Electrolytes in TPN: Continue current concentrations, Cl:Ac ratio 1:1 Add MVI, trace elements, Pepcid 20mg  in TPN Continue to monitor CBGs and adjust as needed  Continue LR at 10 ml/hr per MD F/U TPN labs, plans for advancing diet / weaning TPN  Enzo Bi, PharmD, BCPS Clinical Pharmacist Phone number [phone] 05/23/2018 8:17 AM

## 2018-05-23 NOTE — Progress Notes (Signed)
Nutrition Follow-up  DOCUMENTATION CODES:   Severe malnutrition in context of acute illness/injury, Underweight  INTERVENTION:   - RN to obtain bed weight during this shift once pt transferred back to bed  - TPN per pharmacy  NUTRITION DIAGNOSIS:   Severe Malnutrition related to acute illness (SBO) as evidenced by moderate muscle depletion, severe muscle depletion, moderate fat depletion, severe fat depletion, percent weight loss.  Ongoing, being addressed via TPN  GOAL:   Patient will meet greater than or equal to 90% of their needs  Met via TPN  MONITOR:   Diet advancement, Labs, Weight trends, Skin, I & O's  REASON FOR ASSESSMENT:   Consult New TPN/TNA  ASSESSMENT:    Jeffrey Davila  is a 67 y.o. male, past medical history of schizophrenia, hypertension, BPH, who lives with his sister at home, for last 15 years, he is wheelchair dependent at baseline, but able to transfer from bed to chair, communicative and appropriate per sister, she reports over the last few days patient has been less interactive, sleeping more often during the day, and over the last 2 days almost with no significant oral intake, which prompted her to come to ED.  10/4 - NGT placed 10/8 - NGT removed, NGT replaced 10/10 - TPN started 10/15 - s/p ex lap, LOA, small bowel resection and anastomosis 10/17 - NGT clamped, advanced to clear liquids 10/18 - NGT removed, advanced to full liquids 10/19 - back to clear liquids due to pt reporting emesis 10/20 - developed worsening ileus, NGT replaced, NPO 10/23 - NGT clamped, sips of clears from floor 10/24 - NGT removed, diet advanced to clear liquids, experienced emesis x 2 overnight 10/25 - NGT replaced, NPO  NGT to low intermittent suction at time of visit. Pt resting in recliner with NGT, shaking head "yes" to RD questions regarding how he was feeling but did not verbalize answers. No family present at time of visit.  RN in room at time of visit  providing nursing care. Discussed pt with RN.  RN to obtain bed weight once pt is transferred back to bed which will likely occur during this RN's shift. RD to continue to monitor weight trends during admission and adjust estimated needs as appropriate.  Weight on admit 10/4: 110 lbs (stated weight) Bed weight from 10/21: 110.7 lbs  Current TPN: at 65 mL/hr to provide 94 grams of protein, 218 grams of dextrose, and 47 grams of lipids for a total of 1,585 kcal/day, meeting 100% of patient needs  Medications reviewed and include: Zyprexa 10 mg BID, Miralax daily, TPN  Labs reviewed: BUN 37 (H) CBG's: 107, 104, 108, 98 x 24 hours  UOP: 800 ml x 24 hours NG output: 1000 ml x 24 hours I/O's: -5 L since admit  Diet Order:   Diet Order            Diet NPO time specified  Diet effective now              EDUCATION NEEDS:   Education needs have been addressed  Skin:  Skin Assessment: Skin Integrity Issues: Incisions: closed incision to abdomen  Last BM:  10/25  Height:   Ht Readings from Last 1 Encounters:  04/29/18 '5\' 6"'  (1.676 m)    Weight:   Wt Readings from Last 1 Encounters:  04/29/18 49.9 kg    Ideal Body Weight:  64.5 kg  BMI:  Body mass index is 17.75 kg/m.  Estimated Nutritional Needs:   Kcal:  1550-1750  Protein:  85-100 grams  Fluid:  >1.5 L    Gaynell Face, MS, RD, LDN Inpatient Clinical Dietitian Pager: [phone] Weekend/After Hours: [phone]

## 2018-05-24 ENCOUNTER — Inpatient Hospital Stay (HOSPITAL_COMMUNITY): Payer: Medicare Other

## 2018-05-24 LAB — CBC
HCT: 33 % — ABNORMAL LOW (ref 39.0–52.0)
Hemoglobin: 10.2 g/dL — ABNORMAL LOW (ref 13.0–17.0)
MCH: 29 pg (ref 26.0–34.0)
MCHC: 30.9 g/dL (ref 30.0–36.0)
MCV: 93.8 fL (ref 80.0–100.0)
NRBC: 0 % (ref 0.0–0.2)
Platelets: 524 10*3/uL — ABNORMAL HIGH (ref 150–400)
RBC: 3.52 MIL/uL — AB (ref 4.22–5.81)
RDW: 17 % — AB (ref 11.5–15.5)
WBC: 13.4 10*3/uL — ABNORMAL HIGH (ref 4.0–10.5)

## 2018-05-24 LAB — BASIC METABOLIC PANEL
ANION GAP: 8 (ref 5–15)
BUN: 41 mg/dL — ABNORMAL HIGH (ref 8–23)
CHLORIDE: 104 mmol/L (ref 98–111)
CO2: 24 mmol/L (ref 22–32)
Calcium: 9.7 mg/dL (ref 8.9–10.3)
Creatinine, Ser: 0.73 mg/dL (ref 0.61–1.24)
GFR calc Af Amer: >60 mL/min (ref >60)
GFR calc non Af Amer: >60 mL/min (ref >60)
GLUCOSE: 105 mg/dL — AB (ref 70–99)
POTASSIUM: 4.8 mmol/L (ref 3.5–5.1)
Sodium: 136 mmol/L (ref 135–145)

## 2018-05-24 LAB — GLUCOSE, CAPILLARY: Glucose-Capillary: 93 mg/dL (ref 70–99)

## 2018-05-24 LAB — MAGNESIUM: Magnesium: 2.3 mg/dL (ref 1.7–2.4)

## 2018-05-24 MED ORDER — TRAVASOL 10 % IV SOLN
INTRAVENOUS | Status: AC
  Administered 2018-05-24: 17:00:00 via INTRAVENOUS
  Filled 2018-05-24: qty 936

## 2018-05-24 NOTE — Progress Notes (Signed)
Patient ID: Jeffrey Davila, male   DOB: October 21, 1950, 67 y.o.   MRN: [id number]    14 Days Post-Op  Subjective: Patient is noncommunicative.  Just got up in chair.  Sister is present and states if this isn't better in 2 weeks, she would like to take him back to [place], where they are from; however, as documented, they have all refused patient tx.  Objective: Vital signs in last 24 hours: Temp:  [98.2 F (36.8 C)-99.2 F (37.3 C)] 98.6 F (37 C) (10/29 0533) Pulse Rate:  [83-93] 85 (10/29 0533) Resp:  [16-18] 18 (10/29 0533) BP: (93-104)/(69-77) 104/77 (10/29 0533) SpO2:  [100 %] 100 % (10/29 0533) Last BM Date: 05/20/18  Intake/Output from previous day: 10/28 0701 - 10/29 0700 In: 1073.2 [I.V.:773.2; NG/GT:200; IV Piggyback:100] Out: 1950 [Urine:1200; Emesis/NG output:750] Intake/Output this shift: No intake/output data recorded.  PE: Abd: soft, ND, seems NT on exam, NGT in place with about 200cc of bilious output currently, 1250 documented yesterday.  Incision is c/d/i with steri-strips present  Lab Results:  Recent Labs    05/22/18 0400  WBC 12.1*  HGB 9.5*  HCT 30.8*  PLT 546*   BMET Recent Labs    05/22/18 0400 05/23/18 0347  NA 138 135  K 4.9 4.5  CL 102 105  CO2 24 25  GLUCOSE 99 93  BUN 33* 37*  CREATININE 0.78 0.72  CALCIUM 9.7 9.4   PT/INR No results for input(s): LABPROT, INR in the last 72 hours. CMP     Component Value Date/Time   NA 135 05/23/2018 0347   K 4.5 05/23/2018 0347   CL 105 05/23/2018 0347   CO2 25 05/23/2018 0347   GLUCOSE 93 05/23/2018 0347   BUN 37 (H) 05/23/2018 0347   CREATININE 0.72 05/23/2018 0347   CALCIUM 9.4 05/23/2018 0347   PROT 7.1 05/23/2018 0347   ALBUMIN 2.7 (L) 05/23/2018 0347   AST 10 (L) 05/23/2018 0347   ALT 8 05/23/2018 0347   ALKPHOS 114 05/23/2018 0347   BILITOT 0.3 05/23/2018 0347   GFRNONAA >60 05/23/2018 0347   GFRAA >60 05/23/2018 0347   Lipase     Component Value Date/Time   LIPASE  204 (H) 04/29/2018 0904       Studies/Results: No results found.  Anti-infectives: Anti-infectives (From admission, onward)   Start     Dose/Rate Route Frequency Ordered Stop   05/10/18 0941  ceFAZolin (ANCEF) 2-4 GM/100ML-% IVPB    Note to Pharmacy:  [name]   : cabinet override      05/10/18 0941 05/10/18 2144   05/01/18 1215  metroNIDAZOLE (FLAGYL) IVPB 500 mg  Status:  Discontinued     500 mg 100 mL/hr over 60 Minutes Intravenous Every 8 hours 05/01/18 1201 05/06/18 1153   04/30/18 1100  cefTRIAXone (ROCEPHIN) 2 g in sodium chloride 0.9 % 100 mL IVPB  Status:  Discontinued     2 g 200 mL/hr over 30 Minutes Intravenous Every 24 hours 04/29/18 1416 05/06/18 1153   04/29/18 1430  cefTRIAXone (ROCEPHIN) 1 g in sodium chloride 0.9 % 100 mL IVPB     1 g 200 mL/hr over 30 Minutes Intravenous Every 24 hours 04/29/18 1416 04/30/18 1429   04/29/18 1130  cefTRIAXone (ROCEPHIN) 1 g in sodium chloride 0.9 % 100 mL IVPB     1 g 200 mL/hr over 30 Minutes Intravenous  Once 04/29/18 1118 04/29/18 1251       Assessment/Plan Hx of prior lung  surgery Acute metabolic encephalopathy Anemia- transfused 10/14 - 6.9/21 today Dr. 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 - SBO vs ileus vs dysmotility. Continue NG to LIWS.minimize narcotics and keep  electrolytes repleted. -plain film this morning is benign.  Still with a lot of stool in his colon.  Will give another enema to try and move this along. -OOB to chair! -cont TNA for nutritional support at this time, once tolerating more to eat, will start to wean  ID -rocephin 10/4>>10/11, flagyl 10/6>>10/11 FEN -IVF,NPO/NGT, TNA  VTE -SCDs, SQ heparin Foley -condom cath    LOS: 25 days    Letha Cape , [facility] 05/24/2018, 9:22 AM Pager: [phone]

## 2018-05-24 NOTE — Progress Notes (Signed)
PROGRESS NOTE        PATIENT DETAILS Name: Jeffrey Davila Age: 67 y.o. Sex: male Date of Birth: 1951-03-06 Admit Date: 04/29/2018 Admitting Physician Starleen Arms, MD PCP:Uba, Reuel Boom, MD  Brief Narrative: Patient is a 67 y.o. male with history of schizophrenia/?  Cognitive dysfunction (dementia), hypertension, BPH, remote history (in the 1990s) of trauma causing small bowel injury requiring laparotomy-mostly wheelchair-bound-presented to the hospital with approximately 1 week history of intermittent vomiting, confusion for a few days prior to this hospital admission-presented with sepsis secondary to UTI, vomiting and significant constipation.  Patient was started on IV antibiotics and admitted to the hospital service,initial imaging was suggestive of constipation causing gastric distention-NG tube was placed, however upon further evaluation with a CT scan he was found  to have a small bowel obstruction.  General surgery was consulted, even with n.p.o. status/NG tube decompression-he continues to have SBO-patient's sister has refused to sign consent for surgery and wanted patient to receive several days of TNA before she consented to surgery.  Subsequently underwent laparotomy with lysis of adhesions on 10/15, postoperative course complicated by ileus and low-grade fever.  See below for further details.  Subjective:  Patient in bed, appears comfortable, denies any headache, no fever, no chest pain or pressure, no shortness of breath , +ve abdominal pain. Overall unreliable historian.    Assessment/Plan:  Small bowel obstruction: Underwent laparotomy with lysis of adhesions 05/10/2018 - postoperative course complicated by ileus and likely repeat obstruction as per CT scan done on 05/20/2018 with a left lower quadrant transition point, complicated by lack of activity and baseline wheelchair/bedbound status, no surgery monitoring and following will defer management  of this problem to general surgery.    Currently being managed conservatively with bowel rest, still has significant return with NG tube on 05/24/2018 Case discussed with surgery PA on 05/24/2018 they will be following and managing this problem, noted ABD X Ray 05/24/18, continue with IV TNA and monitor, minimize narcotic use, monitor electrolytes.  Encouraged to sit up in the chair and increase activity.  Low-grade fever on 10/19: No foci of infection apparent-could have been from atelectasis-not on any antimicrobial therapy.  Blood culture on 10/20 neg so far, chest x-ray on 10/20 neg for PNA.  Remains afebrile since then.  Follow.  Sepsis secondary to complicated UTI: Sepsis pathophysiology has resolved, urine culture positive for Klebsiella-has completed a course of IV Rocephin.  Blood cultures remain negative.    Aspiration pneumonia: This occurred in the early part of his hospitalization-felt to be secondary to o SBO/vomiting with underlying frailty.  Has completed a course of antimicrobial therapy.  Continue to mobilize as much as possible.    Acute metabolic encephalopathy: Improved and back to his usual baseline.  Encephalopathy was present on initial presentation-this was felt secondary to UTI, and developing SBO.Marland Kitchen  Anemia: Suspect anemia secondary to acute illness, and IV fluid dilution.  Has been transfused a total of 3 units so far, last transfusion on 10/20.  Follow CBC periodically.  No indication of any overt blood loss at this time.   AKI: Hemodynamically mediated, resolved.  Schizophrenia: Appears stable-continue IV Depakote and olanzapine.    Hypertension: Blood pressure appears stable-continue to hold antihypertensives.   Severe malnutrition: Continue TNA-we will start low-dose nutritional supplements when diet is more stable.    Deconditioning/debility: Suspect has  significant amount of debility at baseline-he is very cachectic-deconditioning/debility has worsened due to  acute illness/SBO.  PT following.  Other issues:  this MD is familiar with this patient-I took care of during the initial part of his hospital stay.  Patient's sister initially wanted guarantees with surgery, wanted transfer to [facility] (refused by [facility]).  See my prior notes.  I have reviewed notes by Dr. Marena Chancy issues with patient's sister refusing NG tube placement, wanting transfer to [facility] ([facility] [place] [facility]  Per nursing staff-she has on occasion been very disruptive to their workflow and in the care of the patient.  I completely agree with Dr. Marena Chancy concerns whether patient's sister is the best person to be making decisions for this patient-given numerous documentations by nursing staff and by prior MDs.  Social work, risk Insurance account manager, Camera operator and palliative care following.    DVT Prophylaxis:  Prophylactic Heparin   Code Status:  Full code   Family Communication:  Sister bedside on 05/22/2018, 05/24/18 with nursing staff present  Please  see my note from 05/17/2018.   Disposition Plan: Remain inpatient-ultimately to SNF when he is more stable.  Antimicrobial agents: Anti-infectives (From admission, onward)   Start     Dose/Rate Route Frequency Ordered Stop   05/10/18 0941  ceFAZolin (ANCEF) 2-4 GM/100ML-% IVPB    Note to Pharmacy:  [name]   : cabinet override      05/10/18 0941 05/10/18 2144   05/01/18 1215  metroNIDAZOLE (FLAGYL) IVPB 500 mg  Status:  Discontinued     500 mg 100 mL/hr over 60 Minutes Intravenous Every 8 hours 05/01/18 1201 05/06/18 1153   04/30/18 1100  cefTRIAXone (ROCEPHIN) 2 g in sodium chloride 0.9 % 100 mL IVPB  Status:  Discontinued     2 g 200 mL/hr over 30 Minutes Intravenous Every 24 hours 04/29/18 1416 05/06/18 1153   04/29/18 1430  cefTRIAXone (ROCEPHIN) 1 g in sodium chloride 0.9 % 100 mL IVPB     1 g 200 mL/hr over 30 Minutes Intravenous Every 24 hours 04/29/18 1416 04/30/18  1429   04/29/18 1130  cefTRIAXone (ROCEPHIN) 1 g in sodium chloride 0.9 % 100 mL IVPB     1 g 200 mL/hr over 30 Minutes Intravenous  Once 04/29/18 1118 04/29/18 1251      Procedures: None  CONSULTS:  None  Time spent: 25 minutes   MEDICATIONS: Scheduled Meds: . benztropine  1 mg Oral BID  . Chlorhexidine Gluconate Cloth  6 each Topical 2 times per day on Tue  . heparin  5,000 Units Subcutaneous Q8H  . lip balm  1 application Topical BID  . OLANZapine zydis  10 mg Oral BID  . polyethylene glycol  17 g Oral Daily  . polyvinyl alcohol  1 drop Both Eyes TID   Continuous Infusions: . sodium chloride 1,000 mL (05/14/18 1850)  . lactated ringers 10 mL/hr at 05/11/18 0617  . TPN ADULT (ION) 65 mL/hr at 05/23/18 1705  . valproate sodium 250 mg (05/24/18 0246)   PRN Meds:.sodium chloride, acetaminophen, albuterol, alum & mag hydroxide-simeth, bisacodyl, diphenhydrAMINE, ketorolac, magic mouthwash, menthol-cetylpyridinium, metoprolol tartrate, ondansetron (ZOFRAN) IV, phenol, sodium chloride flush, traMADol   PHYSICAL EXAM: Vital signs: Vitals:   05/23/18 0807 05/23/18 1300 05/23/18 2127 05/24/18 0533  BP: 101/66 102/69 93/72 104/77  Pulse: 79 83 93 85  Resp: 14 16 18 18   Temp: 98 F (36.7 C) 98.2 F (36.8 C) 99.2 F (37.3 C) 98.6  F (37 C)  TempSrc: Oral Oral Oral Oral  SpO2: 99% 100% 100% 100%  Weight:      Height:       Filed Weights   04/29/18 0842  Weight: 49.9 kg   Body mass index is 17.75 kg/m.   Exam  Awake, NG tube in place, condom catheter in place, [place].AT,PERRAL Supple Neck,No JVD, No cervical lymphadenopathy appriciated.  Symmetrical Chest wall movement, Good air movement bilaterally, CTAB RRR,No Gallops, Rubs or new Murmurs, No Parasternal Heave Hypoactive +ve B.Sounds, Abd Soft, No tenderness, No organomegaly appriciated, No rebound - guarding or rigidity. No Cyanosis, Clubbing or edema, No new Rash or bruise    I have personally reviewed  following labs and imaging studies  LABORATORY DATA: CBC: Recent Labs  Lab 05/21/18 0306 05/22/18 0400 05/23/18 0347 05/24/18 0908  WBC 12.4* 12.1*  --  13.4*  NEUTROABS  --   --  7.1  --   HGB 9.3* 9.5*  --  10.2*  HCT 30.3* 30.8*  --  33.0*  MCV 92.9 92.8  --  93.8  PLT 579* 546*  --  524*    Basic Metabolic Panel: Recent Labs  Lab 05/18/18 0441 05/19/18 0408 05/20/18 0402 05/21/18 0306 05/22/18 0400 05/23/18 0347 05/24/18 0908  NA 137 137 137 136 138 135 136  K 4.2 4.3 4.5 4.7 4.9 4.5 4.8  CL 107 107 105 105 102 105 104  CO2 23 23 25 24 24 25 24   GLUCOSE 104* 90 107* 108* 99 93 105*  BUN 23 22 25* 25* 33* 37* 41*  CREATININE 0.61 0.66 0.70 0.69 0.78 0.72 0.73  CALCIUM 8.6* 8.8* 9.0 9.3 9.7 9.4 9.7  MG 2.0 1.9 2.1 2.2 2.3 2.1 2.3  PHOS 3.9 3.5  --   --   --  4.3  --     GFR: Estimated Creatinine Clearance: 64.1 mL/min (by C-G formula based on SCr of 0.73 mg/dL).  Liver Function Tests: Recent Labs  Lab 05/19/18 0408 05/20/18 0402 05/21/18 0306 05/22/18 0400 05/23/18 0347  AST 17 12* 10* 10* 10*  ALT 15 14 11 10 8   ALKPHOS 148* 134* 129* 127* 114  BILITOT 0.2* 0.3 0.2* 0.2* 0.3  PROT 6.3* 6.6 6.7 6.9 7.1  ALBUMIN 2.3* 2.5* 2.5* 2.6* 2.7*   No results for input(s): LIPASE, AMYLASE in the last 168 hours. No results for input(s): AMMONIA in the last 168 hours.  Coagulation Profile: No results for input(s): INR, PROTIME in the last 168 hours.  Cardiac Enzymes: No results for input(s): CKTOTAL, CKMB, CKMBINDEX, TROPONINI in the last 168 hours.  BNP (last 3 results) No results for input(s): PROBNP in the last 8760 hours.  HbA1C: No results for input(s): HGBA1C in the last 72 hours.  CBG: Recent Labs  Lab 05/22/18 1807 05/22/18 2140 05/23/18 0809 05/23/18 1721 05/23/18 2124  GLUCAP 108* 104* 107* 95 101*    Lipid Profile: Recent Labs    05/23/18 0347  TRIG 53    Thyroid Function Tests: No results for input(s): TSH, T4TOTAL, FREET4,  T3FREE, THYROIDAB in the last 72 hours.  Anemia Panel: No results for input(s): VITAMINB12, FOLATE, FERRITIN, TIBC, IRON, RETICCTPCT in the last 72 hours.  Urine analysis:    Component Value Date/Time   COLORURINE YELLOW 05/15/2018 1109   APPEARANCEUR CLEAR 05/15/2018 1109   LABSPEC 1.013 05/15/2018 1109   PHURINE 7.0 05/15/2018 1109   GLUCOSEU NEGATIVE 05/15/2018 1109   HGBUR SMALL (A) 05/15/2018 1109   BILIRUBINUR  NEGATIVE 05/15/2018 1109   KETONESUR NEGATIVE 05/15/2018 1109   PROTEINUR NEGATIVE 05/15/2018 1109   UROBILINOGEN 1.0 06/08/2010 1523   NITRITE NEGATIVE 05/15/2018 1109   LEUKOCYTESUR TRACE (A) 05/15/2018 1109    Sepsis Labs: Lactic Acid, Venous    Component Value Date/Time   LATICACIDVEN 1.07 04/29/2018 1334    MICROBIOLOGY: Recent Results (from the past 240 hour(s))  Culture, blood (routine x 2)     Status: None   Collection Time: 05/15/18  3:15 PM  Result Value Ref Range Status   Specimen Description BLOOD SITE NOT SPECIFIED  Final   Special Requests   Final    BOTTLES DRAWN AEROBIC ONLY Blood Culture results may not be optimal due to an inadequate volume of blood received in culture bottles   Culture   Final    NO GROWTH 5 DAYS Performed at [facility], [address] [place], [place] [address]    Report Status 05/20/2018 FINAL  Final  Culture, blood (routine x 2)     Status: None   Collection Time: 05/15/18  3:22 PM  Result Value Ref Range Status   Specimen Description BLOOD SITE NOT SPECIFIED  Final   Special Requests   Final    BOTTLES DRAWN AEROBIC ONLY Blood Culture results may not be optimal due to an inadequate volume of blood received in culture bottles   Culture   Final    NO GROWTH 5 DAYS Performed at [facility], [address] [place], [place] [address]    Report Status 05/20/2018 FINAL  Final    RADIOLOGY STUDIES/RESULTS: Ct Abdomen Pelvis Wo Contrast  Result Date: 04/30/2018 CLINICAL DATA:  Nausea, bilious  vomiting EXAM: CT ABDOMEN AND PELVIS WITHOUT CONTRAST TECHNIQUE: Multidetector CT imaging of the abdomen and pelvis was performed following the standard protocol without IV contrast. Sagittal and coronal MPR images reconstructed from axial data set. Oral contrast was not administered. COMPARISON:  None FINDINGS: Lower chest: Emphysematous changes with BILATERAL lower lobe infiltrates consistent with either pneumonia or aspiration. Hepatobiliary: Liver unremarkable. Gallbladder not well visualized due to streak artifacts, grossly unremarkable. Pancreas: Normal appearance Spleen: Normal appearance Adrenals/Urinary Tract: Question adrenal thickening bilaterally. No obvious renal mass or hydronephrosis. Ureters not visualized. Foley catheter decompresses urinary bladder. Stomach/Bowel: Nasogastric tube in stomach. Low-attenuation fluid distends stomach and multiple proximal small bowel loops, with small bowel loops up to 4.1 cm diameter. Distal small bowel loops and colon are decompressed. Scattered stool throughout colon. Findings likely represent mid small bowel obstruction. Appendix not visualized. Suspected rectal wall thickening. Vascular/Lymphatic: Atherosclerotic calcification aorta. Aorta normal caliber. Reproductive: Minimal prostatic enlargement and scattered prostatic calcifications. Other: No definite free air or free fluid. Nonspecific stranding of presacral fat. Musculoskeletal: No acute osseous findings. IMPRESSION: Dilated proximal and decompressed distal small bowel loops compatible with small bowel obstruction, likely in the mid small bowel. Due to lack of fat planes, streak artifacts, and lack of contrast, unable to localize site of obstruction. No evidence of perforation/free air. Question rectal wall thickening, recommend correlation with proctoscopy. Emphysematous changes with bibasilar pulmonary infiltrates either representing pneumonia or aspiration. Electronically Signed   By: Ulyses Southward M.D.    On: 04/30/2018 19:38   Dg Abd 1 View  Result Date: 05/20/2018 CLINICAL DATA:  NG tube adjustment EXAM: ABDOMEN - 1 VIEW COMPARISON:  05/20/2018 FINDINGS: Esophageal tube tip and side-port project over gastric fundus, somewhat abrupt appearing curvature at the distal tip with persistent gaseous dilatation. Continued marked gaseous enlargement  of bowel in the left abdomen up to 5 cm. Moderate stool in the colon. Residual contrast within the bladder. IMPRESSION: 1. Esophageal tube tip and side port overlie the gastric fundus, somewhat abrupt curvature of the tip, could be due to mild kinking. 2. Persistent dilatation of stomach and left abdominal small bowel consistent with a bowel obstruction. Electronically Signed   By: Jasmine Pang M.D.   On: 05/20/2018 18:37   Dg Abd 1 View  Result Date: 05/20/2018 CLINICAL DATA:  NG tube placement. EXAM: ABDOMEN - 1 VIEW COMPARISON:  CT abdomen pelvis from same day. FINDINGS: Tip of the NG tube in the stomach with the proximal side port at the gastroesophageal junction. Dilated loops of small bowel in the left abdomen are unchanged. Contrast is seen within the renal collecting systems and bladder. No acute osseous abnormality. IMPRESSION: 1. NG tube tip in the stomach with the proximal side port at the gastroesophageal junction. Consider advancing 2-3 cm. 2. Unchanged small bowel obstruction. Electronically Signed   By: Obie Dredge M.D.   On: 05/20/2018 16:47   Dg Abd 1 View  Result Date: 05/16/2018 CLINICAL DATA:  Feeding tube advanced today. EXAM: ABDOMEN - 1 VIEW COMPARISON:  Plain films of the abdomen dated 05/15/2018 and 05/14/2018. FINDINGS: Enteric tube is coiled in the stomach with tip at the level of the gastric fundus/cardia. Distended gas-filled loops of large and small bowel are again appreciated throughout the abdomen and upper pelvis. IMPRESSION: 1. Feeding tube is coiled in the stomach with tip at the level of the gastric fundus/cardia. 2.  Persistent gaseous distention of both large and small bowel loops throughout the abdomen and pelvis, compatible with previous reports of obstruction or ileus. Electronically Signed   By: Bary Richard M.D.   On: 05/16/2018 20:26   Dg Abd 1 View  Result Date: 05/03/2018 CLINICAL DATA:  Small bowel obstruction EXAM: ABDOMEN - 1 VIEW COMPARISON:  05/02/2018 FINDINGS: NG tube is in the stomach. Gaseous distention of the stomach and left abdominal small bowel loops. Gas and stool within nondistended colon. No free air or organomegaly. IMPRESSION: Gaseous distention of stomach and left abdominal small bowel loops likely reflecting small bowel obstruction. NG tube is in the stomach. Electronically Signed   By: Charlett Nose M.D.   On: 05/03/2018 10:05   Dg Abd 1 View  Result Date: 05/02/2018 CLINICAL DATA:  Small bowel obstruction EXAM: ABDOMEN - 1 VIEW COMPARISON:  May 01, 2018 abdominal radiograph and CT abdomen and pelvis April 30, 2018 FINDINGS: Nasogastric tube no longer present. There is slightly less bowel dilatation in the left upper quadrant compared to 1 day prior. There is moderate air in the stomach. No bowel dilatation elsewhere. No air-fluid levels. No free air. There is moderate stool in the colon. There is atelectatic change in the right lung base. IMPRESSION: Nasogastric tube no longer appreciable. Less small bowel dilatation in the left upper quadrant. Question resolving bowel obstruction. No free air evident. Electronically Signed   By: Bretta Bang III M.D.   On: 05/02/2018 14:20   Ct Head Wo Contrast  Result Date: 04/29/2018 CLINICAL DATA:  Altered level of consciousness increased over last 2 days, 2 episodes of nausea and vomiting, history of prior brain injury, dementia, former smoker, hypertension EXAM: CT HEAD WITHOUT CONTRAST TECHNIQUE: Contiguous axial images were obtained from the base of the skull through the vertex without intravenous contrast. COMPARISON:  06/08/2010  FINDINGS: Brain: Generalized atrophy. Normal ventricular morphology. No midline  shift or mass effect. Small vessel chronic ischemic changes of deep cerebral white matter. 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 No intracranial hemorrhage, mass lesion, evidence of acute infarction, or extra-axial fluid collection. Vascular: Minimal atherosclerotic calcification of internal carotid arteries at skull base Skull: Intact Sinuses/Orbits: Clear Other: N/A IMPRESSION: Atrophy with small vessel chronic ischemic changes of deep cerebral white matter. 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 No acute intracranial abnormalities. Electronically Signed   By: Ulyses Southward M.D.   On: 04/29/2018 09:58   Ct Abdomen Pelvis W Contrast  Result Date: 05/20/2018 CLINICAL DATA:  Nausea and vomiting. 10 days status post laparoscopic lysis of adhesions and bowel resection. EXAM: CT ABDOMEN AND PELVIS WITH CONTRAST TECHNIQUE: Multidetector CT imaging of the abdomen and pelvis was performed using the standard protocol following bolus administration of intravenous contrast. CONTRAST:  ISOVUE-300 IOPAMIDOL (ISOVUE-300) INJECTION 61% COMPARISON:  04/30/2018 FINDINGS: Lower Chest: Decreased airspace disease seen in both lower lobes, likely due to resolving pneumonia. Hepatobiliary: A few tiny left hepatic lobe cysts are seen. No hepatic masses identified. Tiny calcified gallstones or layering sludge noted, however there is no evidence of cholecystitis or biliary ductal dilatation. Pancreas:  No mass or inflammatory changes. Spleen: Within normal limits in size and appearance. Adrenals/Urinary Tract: No masses identified. No evidence of hydronephrosis. Stomach/Bowel: Small amount of free air is seen, likely postoperative in etiology. No abscess identified. Multiple moderately dilated fluid-filled small bowel loops are seen in the left abdomen, with transition to nondilated small bowel in the left lower quadrant in area  of surgical staples. This is consistent with a small-bowel obstruction. No mass or inflammatory process identified. Moderate amount of stool again seen in the right colon. Vascular/Lymphatic: No pathologically enlarged lymph nodes. No abdominal aortic aneurysm. Reproductive: Mild-to-moderate enlargement of prostate gland shows no significant change. Other: Subcutaneous emphysema is seen in the left anterior abdominal wall, likely postoperative in etiology. Musculoskeletal:  No suspicious bone lesions identified. IMPRESSION: Small bowel obstruction, with transition point in left lower quadrant. This may be due to adhesion, as no obstructing mass or inflammatory process identified. Small amount of postop intraperitoneal air and subcutaneous emphysema. No evidence of abscess. Cholelithiasis.  No radiographic evidence of cholecystitis. Decreased bilateral lower lobe airspace disease, consistent with resolving pneumonia or inflammatory process. Electronically Signed   By: Myles Rosenthal M.D.   On: 05/20/2018 13:41   Dg Chest Port 1 View  Result Date: 05/15/2018 CLINICAL DATA:  Shortness of breath. Patient 5 days postop bowel resection with lysis of adhesions. EXAM: PORTABLE CHEST 1 VIEW COMPARISON:  05/11/2018 FINDINGS: Right-sided PICC line with tip over the SVC. Lungs are adequately inflated with subtle left retrocardiac opacification which may be due to vascular crowding or atelectasis and less likely infection. Remainder of the lungs are clear. Cardiomediastinal silhouette is within normal. Persistent evidence of free peritoneal air compatible with patient's recent surgery. IMPRESSION: Mild left base opacification likely atelectasis or vascular crowding and less likely infection. Persistent free peritoneal air compatible recent postop state. Right-sided PICC line with tip over the SVC. Electronically Signed   By: Elberta Fortis M.D.   On: 05/15/2018 09:38   Dg Chest Port 1 View  Result Date: 05/11/2018 CLINICAL  DATA:  Shortness of breath, hypertension, pneumonia, dementia, former smoker EXAM: PORTABLE CHEST 1 VIEW COMPARISON:  Portable exam 0945 hours compared to 05/09/2018 FINDINGS: Nasogastric tube extends into stomach. RIGHT arm PICC line tip projects over SVC. Normal heart size and pulmonary  vascularity. Elongation of thoracic aorta. Lungs appear emphysematous but clear. No pleural effusion or pneumothorax. Bones demineralized. Free air identified under the hemidiaphragms bilaterally; EHR indicates patient underwent a laparoscopic procedure on 05/10/2018, small-bowel resection and lysis of adhesions for small-bowel obstruction prior operative note. IMPRESSION: COPD changes without infiltrate. Free air consistent with abdominal surgery 1 day ago. Electronically Signed   By: Ulyses Southward M.D.   On: 05/11/2018 10:21   Dg Chest Port 1 View  Result Date: 05/09/2018 CLINICAL DATA:  Cough EXAM: PORTABLE CHEST 1 VIEW COMPARISON:  05/04/2018 FINDINGS: NG tube is stable. Right upper extremity PICC placed. Tip is at the cavoatrial junction. Lungs remain hyperaerated and clear. Small right pleural effusion is stable. No pneumothorax. Aorta remains somewhat prominent due to tortuosity and rotation of the thorax. IMPRESSION: New right upper extremity PICC with its tip at the cavoatrial junction. No active cardiopulmonary disease. Electronically Signed   By: Jolaine Click M.D.   On: 05/09/2018 07:37   Dg Chest Port 1 View  Result Date: 04/29/2018 CLINICAL DATA:  Altered mental status EXAM: PORTABLE CHEST 1 VIEW COMPARISON:  April 12, 2018 FINDINGS: There is mild scarring in the lateral right base and in the medial right lower lung region. There is no edema or consolidation. The heart size and pulmonary vascularity are normal. No adenopathy. There is a recent appearing fracture of the posterior right seventh rib with alignment essentially anatomic. An older fracture of the posterior right eighth rib is noted. No  pneumothorax. IMPRESSION: Recent appearing fracture posterior right seventh rib. Older fracture posterior right eighth rib noted. Areas of mild scarring noted on the right. No edema or consolidation. Stable cardiac silhouette. Electronically Signed   By: Bretta Bang III M.D.   On: 04/29/2018 08:46   Dg Chest Port 1v Same Day  Result Date: 05/01/2018 CLINICAL DATA:  history of schizophrenia, hypertension, BPH-mostly wheelchair-bound-presented to the hospital with approximately 1 week history of intermittent vomiting, confusion for a few days prior to this hospital admission-presented with sepsis secondary to UTI, and significant constipation. C/o SOB EXAM: PORTABLE CHEST - 1 VIEW SAME DAY COMPARISON:  04/29/2018 FINDINGS: Lungs are hyperinflated. Subsegmental atelectasis or early infiltrate at the left lung base, new since previous. Right lung clear. Heart size normal.  Tortuous ectatic thoracic aorta. No effusion. No pneumothorax. Right anterolateral fourth rib fracture, age indeterminate. Nasogastric tube extends to the stomach. IMPRESSION: 1. New patchy infiltrate or subsegmental atelectasis at the left lung base. 2. Right fourth rib fracture without pneumothorax. 3. Nasogastric tube placement to the stomach. Electronically Signed   By: Corlis Leak M.D.   On: 05/01/2018 08:24   Dg Abd 2 Views  Result Date: 05/05/2018 CLINICAL DATA:  Vomiting.  History of right lumpectomy. EXAM: ABDOMEN - 2 VIEW COMPARISON:  05/04/2018 FINDINGS: Improve dilation of small bowel loops with residual borderline gas-filled distended small bowel loops in the left upper quadrant of the abdomen. Normal colonic bowel gas pattern. No evidence of free intra-abdominal gas. Normal cardiac silhouette. Tortuosity of the thoracic aorta. Right pleural reflection versus small pleural effusion. No lobar airspace consolidation. Enteric catheter tip within the expected location the gastric body, side hole at the GE junction level. Right  PICC line terminates in the expected location of the cavoatrial junction. IMPRESSION: Improved appearance of small-bowel obstruction. Enteric catheter with side hole at the level of the GE junction. Advancement may be considered. Possible small right pleural effusion. Electronically Signed   By: Ulanda Edison.D.  On: 05/05/2018 12:51   Dg Abd Acute W/chest  Result Date: 05/04/2018 CLINICAL DATA:  Follow-up small bowel obstruction. EXAM: DG ABDOMEN ACUTE W/ 1V CHEST COMPARISON:  Abdominal x-rays 05/04/1999 19 dating back to 04/29/2018. CT abdomen and pelvis 04/30/2018. Chest x-rays 05/01/2018, 04/12/2018. FINDINGS: Nasogastric tube tip in the fundus of the stomach. Persistent marked gaseous distension of the jejunum in the LEFT UPPER QUADRANT, slowly progressive over the past 3 days, with air-fluid levels on the LATERAL decubitus image. No evidence of free intraperitoneal air. Normal caliber colon with moderate stool burden. Calcified uterine fibroid in the pelvic midline. Cardiac silhouette normal in size, unchanged. Thoracic aorta tortuous and atherosclerotic. Hilar and mediastinal contours otherwise unremarkable. Interval improvement in aeration in the LEFT lung base, with only minimal patchy opacities persisting. Lungs otherwise clear. Emphysematous changes in both lungs with scarring in the RIGHT mid lung, unchanged. Pleuroparenchymal scarring at the RIGHT lung base which accounts for the blunting of the costophrenic angle, unchanged dating back to the original 04/12/2018 examination. IMPRESSION: 1. Persistent partial small bowel obstruction which has slowly worsened over the past 3 days. 2. No evidence of free intraperitoneal air. 3. Improved aeration in the LEFT LOWER LOBE, with only mild atelectasis and/or pneumonia persisting. Electronically Signed   By: Hulan Saas M.D.   On: 05/04/2018 09:28   Dg Abd Portable 1v  Result Date: 05/24/2018 CLINICAL DATA:  Follow-up small bowel  obstruction. EXAM: PORTABLE ABDOMEN - 1 VIEW COMPARISON:  Portable supine abdominal radiograph of May 21, 2018 FINDINGS: The colonic stool burden is moderate. There are loops of mildly distended gas-filled small bowel to the left of midline. There is gas and stool in the rectum. There is no free extraluminal gas. The esophagogastric tube tip in proximal port project below the GE junction. The bony structures exhibit no acute abnormality. IMPRESSION: There are a few loops of mildly distended gas-filled small bowel to the left of midline. The colonic stool burden and gas pattern is within the limits of normal. There is no evidence of perforation. Electronically Signed   By: Javares  [place] M.D.   On: 05/24/2018 09:25   Dg Abd Portable 1v-small Bowel Obstruction Protocol-initial, 8 Hr Delay  Result Date: 05/21/2018 CLINICAL DATA:  Small-bowel obstruction.  Follow-up exam. EXAM: PORTABLE ABDOMEN - 1 VIEW COMPARISON:  05/20/2018 FINDINGS: Significant improvement in small bowel dilation is noted since the prior study. The small bowel obstruction has essentially resolved radiographically. Mild generalized increased stool burden in the colon, unchanged from the previous exam. Midline abdominal wall surgical staples are stable. Nasogastric tube is well positioned, tip in the mid stomach. IMPRESSION: 1. Significant interval improvement from the previous day's exam. Small-bowel obstruction appears resolved radiographically. Electronically Signed   By: Amie Portland M.D.   On: 05/21/2018 13:12   Dg Abd Portable 1v  Result Date: 05/17/2018 CLINICAL DATA:  Small-bowel obstruction. EXAM: PORTABLE ABDOMEN - 1 VIEW COMPARISON:  05/16/2018.  05/15/2018.  CT 04/30/2018. FINDINGS: Surgical staples noted over the abdomen. NG tube noted with its tip in the stomach. Distended loops of small and large bowel are again noted. Similar findings noted on prior exam. No free air. Degenerative change in osteopenia lumbar spine and  both hips. Prostate calcifications are noted. Aortoiliac atherosclerotic vascular calcification. Diffuse osteopenia degenerative changes lumbar spine and both hips. Mild blunting of the right costophrenic angle again noted suggesting scarring and/or tiny effusion. IMPRESSION: NG tube noted with its tip in the stomach. Dilated loops of small and large bowel  are again noted without significant interim change. No free air identified. Electronically Signed   By: Maisie Fus  Register   On: 05/17/2018 07:32   Dg Abd Portable 1v  Result Date: 05/15/2018 CLINICAL DATA:  NG tube placement EXAM: PORTABLE ABDOMEN - 1 VIEW COMPARISON:  05/15/2018 FINDINGS: NG tube is in placed into the fundus of the stomach. Continued gaseous distention of visualized upper abdominal bowel loops. IMPRESSION: NG tube in the fundus of the stomach Electronically Signed   By: Charlett Nose M.D.   On: 05/15/2018 20:11   Dg Abd Portable 1v  Result Date: 05/15/2018 CLINICAL DATA:  Small bowel obstruction. EXAM: PORTABLE ABDOMEN - 1 VIEW COMPARISON:  One-view abdomen 05/14/2018 FINDINGS: Patient's retained left.  Heart size normal.  Lung bases are clear. Extend loops of large and small bowel are stable. There is no free air or pneumatosis. Surgical clips are noted. IMPRESSION: 1. Similar appearance of distended large and small bowel consistent with obstruction or ileus. Electronically Signed   By: Marin Roberts M.D.   On: 05/15/2018 08:10   Dg Abd Portable 1v  Result Date: 05/14/2018 CLINICAL DATA:  Abdominal pain EXAM: PORTABLE ABDOMEN - 1 VIEW COMPARISON:  05/13/2018 FINDINGS: Interval NG tube removal. Some interval improvement in gaseous distention of the bowel. Stool and air throughout the transverse colon. Postop staples again noted. Air in the rectum. No significant interval change. Degenerative changes of the spine. Basilar chronic interstitial changes and atelectasis. IMPRESSION: NG tube removed. Stable nonspecific bowel gas  pattern, suspect resolving obstruction or ileus. Little interval change. Electronically Signed   By: Judie Petit.  Shick M.D.   On: 05/14/2018 10:23   Dg Abd Portable 1v  Result Date: 05/13/2018 CLINICAL DATA:  Abdominal pain, check NG placement EXAM: PORTABLE ABDOMEN - 1 VIEW COMPARISON:  05/09/2018 FINDINGS: Nasogastric catheter is noted with the tip in the stomach. Proximal side port lies in the distal esophagus. This is stable from the prior exam. Scattered large and small bowel gas is noted. Postsurgical changes are now seen. Mild small bowel dilatation is noted which may be related to a postoperative ileus. IMPRESSION: Mild postoperative ileus. Nasogastric catheter as described. Electronically Signed   By: Alcide Clever M.D.   On: 05/13/2018 09:56   Dg Abd Portable 1v  Result Date: 05/09/2018 CLINICAL DATA:  Small-bowel obstruction EXAM: PORTABLE ABDOMEN - 1 VIEW COMPARISON:  05/08/2018; 05/06/2018; 05/05/2018 FINDINGS: Re-demonstrated gaseous distention of several loops of small bowel with index loop of small bowel within the left mid hemiabdomen measuring 3.6 cm in diameter. Moderate colonic stool burden. No supine evidence of pneumoperitoneum. No pneumatosis or portal venous gas. Presumed calcified fibroid overlies the left hemipelvis. Stable position of support apparatus. No acute osseus abnormalities. IMPRESSION: No change to slight improvement in suspected small-bowel obstruction. Electronically Signed   By: Simonne Come M.D.   On: 05/09/2018 07:47   Dg Abd Portable 1v  Result Date: 05/08/2018 CLINICAL DATA:  Follow up small bowel obstruction EXAM: PORTABLE ABDOMEN - 1 VIEW COMPARISON:  05/06/2018 FINDINGS: Scattered large and small bowel gas is noted. Multiple dilated loops of small bowel are again identified and stable in appearance. Nasogastric catheter is again noted within the stomach. No free air is seen. Degenerative changes of the lumbar spine are noted. IMPRESSION: Persistent small bowel  dilatation. No new focal abnormality is noted. Electronically Signed   By: Alcide Clever M.D.   On: 05/08/2018 08:36   Dg Abd Portable 1v  Result Date: 05/06/2018 CLINICAL DATA:  Small bowel obstruction EXAM: PORTABLE ABDOMEN - 1 VIEW COMPARISON:  Portable exam at 0628 hours compared to 05/05/2018 FINDINGS: Stool scattered throughout colon. Persistent dilatation of small bowel loops in the LEFT mid abdomen up to 4.9 cm diameter. No bowel wall thickening. Bones demineralized. No definite renal calcifications. IMPRESSION: Persistent small bowel dilatation consistent with small bowel obstruction. Little interval change. Electronically Signed   By: Ulyses Southward M.D.   On: 05/06/2018 10:21   Dg Abd Portable 1v-small Bowel Obstruction Protocol-initial, 8 Hr Delay  Result Date: 05/04/2018 CLINICAL DATA:  Small bowel obstruction protocol. 8 hour delay. EXAM: PORTABLE ABDOMEN - 1 VIEW COMPARISON:  05/03/2018 FINDINGS: Gaseous distention of left upper quadrant small bowel. Enteric tube with tip projected over the mid abdomen consistent with location in the upper stomach. Contrast material is demonstrated in the dilated small bowel. No contrast material is identified in the colon. This is suggestive of high-grade obstruction. Degenerative changes in the spine. IMPRESSION: Contrast material is demonstrated in the dilated small bowel but not in the colon consistent with high-grade obstruction. Electronically Signed   By: Burman Nieves M.D.   On: 05/04/2018 01:55   Dg Abd Portable 1v-small Bowel Obstruction Protocol-initial, 8 Hr Delay  Result Date: 05/01/2018 CLINICAL DATA:  Small bowel obstruction. EXAM: PORTABLE ABDOMEN - 1 VIEW COMPARISON:  Radiograph earlier this day at 1109 hour, CT yesterday. FINDINGS: Enteric tube in place with tip in the stomach, side-port just beyond the gastroesophageal junction. Improving small bowel dilatation in the left abdomen. Moderate stool in the right and transverse colon. No  evidence of free air. Pelvic calcification may be stone in the urinary bladder or exophytic prostate calcification. IMPRESSION: Improving small bowel dilatation in the left abdomen since radiograph earlier this day. Electronically Signed   By: Narda Rutherford M.D.   On: 05/01/2018 21:14   Dg Abd Portable 1v-small Bowel Protocol-position Verification  Result Date: 05/01/2018 CLINICAL DATA:  Nasogastric tube placement EXAM: PORTABLE ABDOMEN - 1 VIEW COMPARISON:  Portable exam 1109 hours compared to 04/30/2018 FINDINGS: Tip of nasogastric tube projects over mid stomach. Dilated small bowel loops are seen in the LEFT mid abdomen. Increased stool in RIGHT colon. Lung bases appear emphysematous with subsegmental atelectasis at LEFT base. Bones demineralized. IMPRESSION: Tip of nasogastric tube projects over mid stomach. Small bowel dilatation in LEFT mid abdomen with increased stool in RIGHT colon. Electronically Signed   By: Ulyses Southward M.D.   On: 05/01/2018 11:22   Dg Abd Portable 1v  Result Date: 04/30/2018 CLINICAL DATA:  67 year old male with vomiting. EXAM: PORTABLE ABDOMEN - 1 VIEW COMPARISON:  04/29/2018 abdominal radiographs. FINDINGS: Portable AP semi upright and supine views at 0847 hours. Enteric tube has been placed and the stomach now appears decompressed. NG tube side hole at the gastric body. Non obstructed bowel gas pattern. Moderate volume of retained stool redemonstrated in the transverse colon and distal sigmoid/rectum. Negative visible lung bases. No pneumoperitoneum. Stable abdominal and pelvic visceral contours. Dystrophic calcification in the pelvis. Stable pelvic catheter, likely a Foley. No acute osseous abnormality identified. IMPRESSION: 1. NG tube in place with resolved gastric distention. 2. Non obstructed bowel gas pattern with moderate volume of retained stool. Electronically Signed   By: Odessa Fleming M.D.   On: 04/30/2018 09:06   Dg Abd Portable 1v  Result Date: 04/29/2018 CLINICAL  DATA:  Nausea and vomiting EXAM: PORTABLE ABDOMEN - 1 VIEW COMPARISON:  None. FINDINGS: Gaseous distention of the stomach. Formed stool throughout the colon.  No concerning mass effect or calcification. IMPRESSION: Prominent gaseous distension of the stomach. Constipated appearance. Electronically Signed   By: Marnee Spring M.D.   On: 04/29/2018 14:44   [place] Ekg Site Rite  Result Date: 05/04/2018 If Site Rite image not attached, placement could not be confirmed due to current cardiac rhythm.    LOS: 25 days   Signature  Susa Raring M.D on 05/24/2018 at 10:09 AM   -  To page go to [email or web address] - password [facility]

## 2018-05-24 NOTE — Progress Notes (Signed)
OT Cancellation Note  Patient Details Name: Jeffrey Davila MRN: [id number] DOB: 1951/03/11   Cancelled Treatment:    Reason Eval/Treat Not Completed: Other (comment); pt just returned to bed from sitting up in recliner with nursing assist. Pt's sister present and reports pt has "done enough" and requesting to let pt rest at this time. Will follow up as schedule permits.  [name], OT [facility] Pager [phone] Office [phone]   [name] 05/24/2018, 12:50 PM

## 2018-05-24 NOTE — Progress Notes (Signed)
PHARMACY - ADULT TOTAL PARENTERAL NUTRITION CONSULT NOTE   Pharmacy Consult for TPN Indication: SBO  Patient Measurements: Height: 5\' 6"  (167.6 cm) Weight: 110 lb (49.9 kg) IBW/kg (Calculated) : 63.8 TPN AdjBW (KG): 49.9 Body mass index is 17.75 kg/m.  Assessment:  30 YOM admitted on 10/4 with sepsis. He was found to have pneumonia and a possible SBO on CT on 10/5. Pt has a history of SBO s/p colectomy in 1993 (history (in the 1990s) of trauma causing small bowel injury requiring laparotomy). An NG tube was placed with improving small bowel dilatation but patient had 3-4 episodes of vomiting overnight on 10/7-10/8 and the NG tube had to be replaced due to incorrect placement. Pharmacy now consulted to manage TPN due to worsening SBO and concern for adhesions that require surgery.  Actual body is significantly below ideal body weight.  Of note, patient is extremely cachectic. Patient hx unreliable due to mental status.  GI: Albumin low at 2.7, Pre-albumin up to 30.1. S/p ex-lap with LOA, SBR and anastomosis on 10/15. NGT with of output yesterday. +BS, last BM 10/25. Abd xray shows moderate stool burden  Endo: No hx DM - CBGs controlled, SSI d/c'd 10/14 Lytes: wnl today. Renal: SCr stable, UOP improved, LR at [facility] Pulm: RA Cards: VSS.  Hepatobil: LFTs / Tbili / Trig wnl Neuro: Schizophrenia. Dementia. Benztropine, Zyprexa bid, IV valproate - Can't really make appropriate decisions ID: s/p abx for Klebsiella UTI and aspiration PNA. Afebrile, WBC WNL Heme: SQ heparin. Plts stable. Transfuse 10/20 > Hg low stable  TPN Access: double lumen PICC placed 10/10 TPN start date: 05/05/18  Nutritional Goals (per RD rec on 10/21): 1550-1750kCal, 85-100 gm protein, >1.5 L fluid per day  Current Nutrition:  TPN NPO  Plan:  Continue TPN at 65 mL/hr This TPN provides 94 g of protein, 218 g of dextrose, and 47 g of lipids for a total of 1,585 kCals per day, meeting 100% of patient  needs Electrolytes in TPN: Continue current concentrations, Cl:Ac ratio 1:1 Add MVI, trace elements, Pepcid 20mg  in TPN Continue to monitor CBGs and adjust as needed  Continue LR at 10 ml/hr per MD F/U TPN labs, plans for advancing diet / weaning TPN  Vinnie Level, PharmD., BCPS Clinical Pharmacist Clinical phone for 05/24/18 until 3:30pm: [phone] If after 3:30pm, please refer to [facility] for unit-specific pharmacist

## 2018-05-25 LAB — BASIC METABOLIC PANEL
ANION GAP: 4 — AB (ref 5–15)
BUN: 61 mg/dL — AB (ref 8–23)
CALCIUM: 9.5 mg/dL (ref 8.9–10.3)
CO2: 24 mmol/L (ref 22–32)
Chloride: 108 mmol/L (ref 98–111)
Creatinine, Ser: 0.95 mg/dL (ref 0.61–1.24)
GFR calc Af Amer: >60 mL/min (ref >60)
GFR calc non Af Amer: >60 mL/min (ref >60)
Glucose, Bld: 98 mg/dL (ref 70–99)
Potassium: 5.2 mmol/L — ABNORMAL HIGH (ref 3.5–5.1)
Sodium: 136 mmol/L (ref 135–145)

## 2018-05-25 LAB — GLUCOSE, CAPILLARY
Glucose-Capillary: 102 mg/dL — ABNORMAL HIGH (ref 70–99)
Glucose-Capillary: 93 mg/dL (ref 70–99)
Glucose-Capillary: 97 mg/dL (ref 70–99)

## 2018-05-25 MED ORDER — SORBITOL 70 % SOLN
960.0000 mL | TOPICAL_OIL | Freq: Once | ORAL | Status: AC
  Administered 2018-05-25: 960 mL via RECTAL
  Filled 2018-05-25: qty 473

## 2018-05-25 MED ORDER — TRAVASOL 10 % IV SOLN
INTRAVENOUS | Status: AC
  Administered 2018-05-25: 17:00:00 via INTRAVENOUS
  Filled 2018-05-25: qty 936

## 2018-05-25 MED ORDER — FUROSEMIDE 10 MG/ML IJ SOLN
40.0000 mg | Freq: Once | INTRAMUSCULAR | Status: AC
  Administered 2018-05-25: 40 mg via INTRAVENOUS
  Filled 2018-05-25: qty 4

## 2018-05-25 MED ORDER — SODIUM CHLORIDE 0.9 % IV BOLUS
500.0000 mL | Freq: Once | INTRAVENOUS | Status: AC
  Administered 2018-05-25: 500 mL via INTRAVENOUS

## 2018-05-25 NOTE — Progress Notes (Signed)
OT Cancellation Note  Patient Details Name: Jeffrey Davila MRN: [id number] DOB: [birth year]   Cancelled Treatment:    Reason Eval/Treat Not Completed: Other (comment)(About to receive an enema per RN. Requesting hold till later today. Will return as schedule allows. Thank you.)  Anaise Sterbenz M Hailee Hollick Duglas Heier MSOT, OTR/L Acute Rehab Pager: [phone] Office: [phone] 05/25/2018, 2:37 PM

## 2018-05-25 NOTE — Progress Notes (Signed)
PHARMACY - ADULT TOTAL PARENTERAL NUTRITION CONSULT NOTE   Pharmacy Consult for TPN Indication: SBO  Patient Measurements: Height: 5\' 6"  (167.6 cm) Weight: 110 lb (49.9 kg) IBW/kg (Calculated) : 63.8 TPN AdjBW (KG): 49.9 Body mass index is 17.75 kg/m.  Assessment:  30 YOM admitted on 10/4 with sepsis. He was found to have pneumonia and a possible SBO on CT on 10/5. Pt has a history of SBO s/p colectomy in 1993 (history (in the 1990s) of trauma causing small bowel injury requiring laparotomy). An NG tube was placed with improving small bowel dilatation but patient had 3-4 episodes of vomiting overnight on 10/7-10/8 and the NG tube had to be replaced due to incorrect placement. Pharmacy now consulted to manage TPN due to worsening SBO and concern for adhesions that require surgery.  Actual body is significantly below ideal body weight.  Of note, patient is extremely cachectic. Patient hx unreliable due to mental status.  GI: Albumin low at 2.7, Pre-albumin up to 30.1. S/p ex-lap with LOA, SBR and anastomosis on 10/15. NGT with of output yesterday that is billious. +BS, last BM 10/25. Abd xray shows moderate stool burden  Endo: No hx DM - CBGs controlled, SSI d/c'd 10/14 Lytes: wnl today. Renal: SCr trending up, K up to 5.2, No UOP charted, LR at [facility] Pulm: RA Cards: VSS.  Hepatobil: LFTs / Tbili / Trig wnl Neuro: Schizophrenia. Dementia. Benztropine, Zyprexa bid, IV valproate - Can't really make appropriate decisions ID: s/p abx for Klebsiella UTI and aspiration PNA. Afebrile, WBC WNL Heme: SQ heparin. Plts stable. Transfuse 10/20 > Hg low stable  TPN Access: double lumen PICC placed 10/10 TPN start date: 05/05/18  Nutritional Goals (per RD rec on 10/21): 1550-1750kCal, 85-100 gm protein, >1.5 L fluid per day  Current Nutrition:  TPN NPO  Plan:  Continue TPN at 65 mL/hr This TPN provides 94 g of protein, 218 g of dextrose, and 47 g of lipids for a total of 1,585 kCals per  day, meeting 100% of patient needs Electrolytes in TPN: Decrease K in bag, Cl:Ac ratio 1:1 Add MVI, trace elements, Pepcid 20mg  in TPN Continue to monitor CBGs and adjust as needed  Continue LR at 10 ml/hr per MD Attempting enema. Monitor for BM  F/U TPN labs, plans for advancing diet / weaning TPN  Vinnie Level, PharmD., BCPS Clinical Pharmacist Clinical phone for 05/25/18 until 3:30pm: [phone] If after 3:30pm, please refer to [facility] for unit-specific pharmacist

## 2018-05-25 NOTE — Progress Notes (Signed)
Pt attempted to pull out NG tube, while RN and NT where giving pt bath, RN attempted to change the nasogastric tube holder pt got agitated and began to swing and scratch the nurse and nurse tech and scratched himself on his left side of his abdomen. Nurse tech and Nurse helped calm pt down and finished bath and transferred pt to chair. Call bell within reach and chair alarm on.

## 2018-05-25 NOTE — Progress Notes (Signed)
Patient ID: Jeffrey Davila, male   DOB: 03-Dec-1950, 67 y.o.   MRN: [id number]    15 Days Post-Op  Subjective: Patient more talkative today than any other day I have seen him.  He still answers "yes" to everything.  Objective: Vital signs in last 24 hours: Temp:  [98.4 F (36.9 C)-98.8 F (37.1 C)] 98.4 F (36.9 C) (10/30 0555) Pulse Rate:  [77-86] 81 (10/30 0555) Resp:  [17-18] 17 (10/30 0555) BP: (90-95)/(59-65) 95/65 (10/30 0555) SpO2:  [98 %-100 %] 98 % (10/30 0555) Last BM Date: 05/20/18  Intake/Output from previous day: 10/29 0701 - 10/30 0700 In: 3158.4 [I.V.:2841.5; IV Piggyback:316.9] Out: 800 [Emesis/NG output:800] Intake/Output this shift: Total I/O In: 500 [IV Piggyback:500] Out: -   PE: Abd: soft, seems nontender to palpation, some BS, NGT with 800cc of bilious output in last 24/hrs. Incision is healing well.  Lab Results:  Recent Labs    05/24/18 0908  WBC 13.4*  HGB 10.2*  HCT 33.0*  PLT 524*   BMET Recent Labs    05/24/18 0908 05/25/18 0411  NA 136 136  K 4.8 5.2*  CL 104 108  CO2 24 24  GLUCOSE 105* 98  BUN 41* 61*  CREATININE 0.73 0.95  CALCIUM 9.7 9.5   PT/INR No results for input(s): LABPROT, INR in the last 72 hours. CMP     Component Value Date/Time   NA 136 05/25/2018 0411   K 5.2 (H) 05/25/2018 0411   CL 108 05/25/2018 0411   CO2 24 05/25/2018 0411   GLUCOSE 98 05/25/2018 0411   BUN 61 (H) 05/25/2018 0411   CREATININE 0.95 05/25/2018 0411   CALCIUM 9.5 05/25/2018 0411   PROT 7.1 05/23/2018 0347   ALBUMIN 2.7 (L) 05/23/2018 0347   AST 10 (L) 05/23/2018 0347   ALT 8 05/23/2018 0347   ALKPHOS 114 05/23/2018 0347   BILITOT 0.3 05/23/2018 0347   GFRNONAA >60 05/25/2018 0411   GFRAA >60 05/25/2018 0411   Lipase     Component Value Date/Time   LIPASE 204 (H) 04/29/2018 0904       Studies/Results: Dg Abd Portable 1v  Result Date: 05/24/2018 CLINICAL DATA:  Follow-up small bowel obstruction. EXAM: PORTABLE ABDOMEN  - 1 VIEW COMPARISON:  Portable supine abdominal radiograph of May 21, 2018 FINDINGS: The colonic stool burden is moderate. There are loops of mildly distended gas-filled small bowel to the left of midline. There is gas and stool in the rectum. There is no free extraluminal gas. The esophagogastric tube tip in proximal port project below the GE junction. The bony structures exhibit no acute abnormality. IMPRESSION: There are a few loops of mildly distended gas-filled small bowel to the left of midline. The colonic stool burden and gas pattern is within the limits of normal. There is no evidence of perforation. Electronically Signed   By: Leovardo  [place] M.D.   On: 05/24/2018 09:25    Anti-infectives: Anti-infectives (From admission, onward)   Start     Dose/Rate Route Frequency Ordered Stop   05/10/18 0941  ceFAZolin (ANCEF) 2-4 GM/100ML-% IVPB    Note to Pharmacy:  [name]   : cabinet override      05/10/18 0941 05/10/18 2144   05/01/18 1215  metroNIDAZOLE (FLAGYL) IVPB 500 mg  Status:  Discontinued     500 mg 100 mL/hr over 60 Minutes Intravenous Every 8 hours 05/01/18 1201 05/06/18 1153   04/30/18 1100  cefTRIAXone (ROCEPHIN) 2 g in sodium chloride 0.9 %  100 mL IVPB  Status:  Discontinued     2 g 200 mL/hr over 30 Minutes Intravenous Every 24 hours 04/29/18 1416 05/06/18 1153   04/29/18 1430  cefTRIAXone (ROCEPHIN) 1 g in sodium chloride 0.9 % 100 mL IVPB     1 g 200 mL/hr over 30 Minutes Intravenous Every 24 hours 04/29/18 1416 04/30/18 1429   04/29/18 1130  cefTRIAXone (ROCEPHIN) 1 g in sodium chloride 0.9 % 100 mL IVPB     1 g 200 mL/hr over 30 Minutes Intravenous  Once 04/29/18 1118 04/29/18 1251       Assessment/Plan Hx of prior lung surgery Acute metabolic encephalopathy Anemia- transfused 10/14 - 6.9/21 today Dr. 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  Unclear if this is related to dysmotility vs ileus vs new SBO.  Patient is not a surgical candidate given he is over 2 weeks postop.  Cont conservative management at this time -Still with a lot of stool in his colon.  Will give another enema to try and move this along. -OOB to chair -cont TNA for nutritional support at this time, once tolerating more to eat, will start to wean  ID -rocephin 10/4>>10/11, flagyl 10/6>>10/11 FEN -IVF,NPO/NGT, TNA  VTE -SCDs, SQ heparin Foley -condom cath    LOS: 26 days    Letha Cape , [facility] 05/25/2018, 9:52 AM Pager: [phone]

## 2018-05-25 NOTE — Progress Notes (Signed)
PROGRESS NOTE        PATIENT DETAILS Name: Jeffrey Davila Age: 67 y.o. Sex: male Date of Birth: July 20, 1951 Admit Date: 04/29/2018 Admitting Physician Starleen Arms, MD PCP:Uba, Reuel Boom, MD  Brief Narrative: Patient is a 67 y.o. male with history of schizophrenia/?  Cognitive dysfunction (dementia), hypertension, BPH, remote history (in the 1990s) of trauma causing small bowel injury requiring laparotomy-mostly wheelchair-bound-presented to the hospital with approximately 1 week history of intermittent vomiting, confusion for a few days prior to this hospital admission-presented with sepsis secondary to UTI, vomiting and significant constipation.  Patient was started on IV antibiotics and admitted to the hospital service,initial imaging was suggestive of constipation causing gastric distention-NG tube was placed, however upon further evaluation with a CT scan he was found  to have a small bowel obstruction.  General surgery was consulted, even with n.p.o. status/NG tube decompression-he continues to have SBO-patient's sister has refused to sign consent for surgery and wanted patient to receive several days of TNA before she consented to surgery.  Subsequently underwent laparotomy with lysis of adhesions on 10/15, postoperative course complicated by ileus and low-grade fever.  See below for further details.  Subjective:  In chair, says yes to chest pain-abd pain, passing flatus,having a BM ( not having them) Overall  Extremely unreliable historian.    Assessment/Plan:  Small bowel obstruction: Underwent laparotomy with lysis of adhesions 05/10/2018 - postoperative course complicated by ileus and likely repeat obstruction as per CT scan done on 05/20/2018 with a left lower quadrant transition point, complicated by lack of activity and baseline wheelchair/bedbound status, no surgery monitoring and following will defer management of this problem to general surgery.     Currently being managed conservatively with bowel rest, still has significant return with NG tube on 05/24/2018 Case discussed with surgery PA on 05/25/2018 they will give an SMOG enema, noted ABD X Ray 05/24/18, continue with IV TNA and monitor, minimize narcotic use, monitor electrolytes.  Encouraged to sit up in the chair and increase activity.  Low-grade fever on 10/19: No foci of infection apparent-could have been from atelectasis-not on any antimicrobial therapy.  Blood culture on 10/20 neg so far, chest x-ray on 10/20 neg for PNA.  Remains afebrile since then.  Follow.  Sepsis secondary to complicated UTI: Sepsis pathophysiology has resolved, urine culture positive for Klebsiella-has completed a course of IV Rocephin.  Blood cultures remain negative.    Aspiration pneumonia: This occurred in the early part of his hospitalization-felt to be secondary to o SBO/vomiting with underlying frailty.  Has completed a course of antimicrobial therapy.  Continue to mobilize as much as possible.    Acute metabolic encephalopathy: Improved and back to his usual baseline.  Encephalopathy was present on initial presentation-this was felt secondary to UTI, and developing SBO.Marland Kitchen  Anemia: Suspect anemia secondary to acute illness, and IV fluid dilution.  Has been transfused a total of 3 units so far, last transfusion on 10/20.  Follow CBC periodically.  No indication of any overt blood loss at this time.   AKI: Hemodynamically mediated, resolved.  Schizophrenia: Appears stable-continue IV Depakote and olanzapine.    Hypertension: Blood pressure appears stable-continue to hold antihypertensives.   Severe malnutrition: Continue TNA-we will start low-dose nutritional supplements when diet is more stable.    Deconditioning/debility: Suspect has significant amount of debility at baseline-he is  very cachectic-deconditioning/debility has worsened due to acute illness/SBO.  PT following.  Hyperkalemia - IVF and  IV Lasix, repeat K in am.  Other issues:  Patient's sister initially wanted guarantees with surgery, wanted transfer to [facility] (refused by [facility]).  See my prior notes.  I have reviewed notes by Dr. Marena Chancy issues with patient's sister refusing NG tube placement, wanting transfer to [facility] ([facility]used [facility] [place]).  Per nursing staff-she has on occasion been very disruptive to their workflow and in the care of the patient.  I completely agree with Dr. Marena Chancy concerns whether patient's sister is the best person to be making decisions for this patient-given numerous documentations by nursing staff and by prior MDs.  Social work, risk Insurance account manager, Camera operator and palliative care following.   Please  see my note from 05/17/2018.   DVT Prophylaxis:  Prophylactic Heparin   Code Status:  Full code   Family Communication:  Sister bedside on 05/22/2018, 05/24/18 with nursing staff present   Disposition Plan:  Remain inpatient-ultimately to SNF when he is more stable.  Antimicrobial agents: Anti-infectives (From admission, onward)   Start     Dose/Rate Route Frequency Ordered Stop   05/10/18 0941  ceFAZolin (ANCEF) 2-4 GM/100ML-% IVPB    Note to Pharmacy:  [name]   : cabinet override      05/10/18 0941 05/10/18 2144   05/01/18 1215  metroNIDAZOLE (FLAGYL) IVPB 500 mg  Status:  Discontinued     500 mg 100 mL/hr over 60 Minutes Intravenous Every 8 hours 05/01/18 1201 05/06/18 1153   04/30/18 1100  cefTRIAXone (ROCEPHIN) 2 g in sodium chloride 0.9 % 100 mL IVPB  Status:  Discontinued     2 g 200 mL/hr over 30 Minutes Intravenous Every 24 hours 04/29/18 1416 05/06/18 1153   04/29/18 1430  cefTRIAXone (ROCEPHIN) 1 g in sodium chloride 0.9 % 100 mL IVPB     1 g 200 mL/hr over 30 Minutes Intravenous Every 24 hours 04/29/18 1416 04/30/18 1429   04/29/18 1130  cefTRIAXone (ROCEPHIN) 1 g in sodium chloride 0.9 % 100 mL IVPB     1 g 200  mL/hr over 30 Minutes Intravenous  Once 04/29/18 1118 04/29/18 1251      Procedures: None  CONSULTS:  None  Time spent: 25 minutes   MEDICATIONS: Scheduled Meds: . benztropine  1 mg Oral BID  . Chlorhexidine Gluconate Cloth  6 each Topical 2 times per day on Tue  . heparin  5,000 Units Subcutaneous Q8H  . lip balm  1 application Topical BID  . OLANZapine zydis  10 mg Oral BID  . polyethylene glycol  17 g Oral Daily  . polyvinyl alcohol  1 drop Both Eyes TID   Continuous Infusions: . sodium chloride 450 mL (05/24/18 1822)  . lactated ringers 10 mL/hr at 05/11/18 0617  . TPN ADULT (ION) 65 mL/hr at 05/24/18 1727  . valproate sodium 250 mg (05/25/18 0602)   PRN Meds:.sodium chloride, acetaminophen, albuterol, alum & mag hydroxide-simeth, bisacodyl, diphenhydrAMINE, ketorolac, magic mouthwash, menthol-cetylpyridinium, metoprolol tartrate, ondansetron (ZOFRAN) IV, phenol, sodium chloride flush, traMADol   PHYSICAL EXAM: Vital signs: Vitals:   05/24/18 1538 05/24/18 1640 05/24/18 2223 05/25/18 0555  BP: 90/65 92/64 92/65  95/65  Pulse: 82  77 81  Resp:   17 17  Temp:   98.6 F (37 C) 98.4 F (36.9 C)  TempSrc:   Oral Oral  SpO2:   99% 98%  Weight:  Height:       Filed Weights   04/29/18 0842  Weight: 49.9 kg   Body mass index is 17.75 kg/m.   Exam  Awake, sitting up in chair in no distress, NG tube in place, [place].AT,PERRAL Supple Neck,No JVD, No cervical lymphadenopathy appriciated.  Symmetrical Chest wall movement, Good air movement bilaterally, CTAB RRR,No Gallops, Rubs or new Murmurs, No Parasternal Heave hypoactive but +ve B.Sounds, Abd Soft, No tenderness, No organomegaly appriciated, No rebound - guarding or rigidity. No Cyanosis, Clubbing or edema, No new Rash or bruise  I have personally reviewed following labs and imaging studies  LABORATORY DATA: CBC: Recent Labs  Lab 05/21/18 0306 05/22/18 0400 05/23/18 0347 05/24/18 0908  WBC 12.4*  12.1*  --  13.4*  NEUTROABS  --   --  7.1  --   HGB 9.3* 9.5*  --  10.2*  HCT 30.3* 30.8*  --  33.0*  MCV 92.9 92.8  --  93.8  PLT 579* 546*  --  524*    Basic Metabolic Panel: Recent Labs  Lab 05/19/18 0408 05/20/18 0402 05/21/18 0306 05/22/18 0400 05/23/18 0347 05/24/18 0908 05/25/18 0411  NA 137 137 136 138 135 136 136  K 4.3 4.5 4.7 4.9 4.5 4.8 5.2*  CL 107 105 105 102 105 104 108  CO2 23 25 24 24 25 24 24   GLUCOSE 90 107* 108* 99 93 105* 98  BUN 22 25* 25* 33* 37* 41* 61*  CREATININE 0.66 0.70 0.69 0.78 0.72 0.73 0.95  CALCIUM 8.8* 9.0 9.3 9.7 9.4 9.7 9.5  MG 1.9 2.1 2.2 2.3 2.1 2.3  --   PHOS 3.5  --   --   --  4.3  --   --     GFR: Estimated Creatinine Clearance: 54 mL/min (by C-G formula based on SCr of 0.95 mg/dL).  Liver Function Tests: Recent Labs  Lab 05/19/18 0408 05/20/18 0402 05/21/18 0306 05/22/18 0400 05/23/18 0347  AST 17 12* 10* 10* 10*  ALT 15 14 11 10 8   ALKPHOS 148* 134* 129* 127* 114  BILITOT 0.2* 0.3 0.2* 0.2* 0.3  PROT 6.3* 6.6 6.7 6.9 7.1  ALBUMIN 2.3* 2.5* 2.5* 2.6* 2.7*   No results for input(s): LIPASE, AMYLASE in the last 168 hours. No results for input(s): AMMONIA in the last 168 hours.  Coagulation Profile: No results for input(s): INR, PROTIME in the last 168 hours.  Cardiac Enzymes: No results for input(s): CKTOTAL, CKMB, CKMBINDEX, TROPONINI in the last 168 hours.  BNP (last 3 results) No results for input(s): PROBNP in the last 8760 hours.  HbA1C: No results for input(s): HGBA1C in the last 72 hours.  CBG: Recent Labs  Lab 05/23/18 1721 05/23/18 2124 05/24/18 1753 05/25/18 0015 05/25/18 0755  GLUCAP 95 101* 93 97 102*    Lipid Profile: Recent Labs    05/23/18 0347  TRIG 53    Thyroid Function Tests: No results for input(s): TSH, T4TOTAL, FREET4, T3FREE, THYROIDAB in the last 72 hours.  Anemia Panel: No results for input(s): VITAMINB12, FOLATE, FERRITIN, TIBC, IRON, RETICCTPCT in the last 72  hours.  Urine analysis:    Component Value Date/Time   COLORURINE YELLOW 05/15/2018 1109   APPEARANCEUR CLEAR 05/15/2018 1109   LABSPEC 1.013 05/15/2018 1109   PHURINE 7.0 05/15/2018 1109   GLUCOSEU NEGATIVE 05/15/2018 1109   HGBUR SMALL (A) 05/15/2018 1109   BILIRUBINUR NEGATIVE 05/15/2018 1109   KETONESUR NEGATIVE 05/15/2018 1109   PROTEINUR NEGATIVE 05/15/2018 1109  UROBILINOGEN 1.0 06/08/2010 1523   NITRITE NEGATIVE 05/15/2018 1109   LEUKOCYTESUR TRACE (A) 05/15/2018 1109    Sepsis Labs: Lactic Acid, Venous    Component Value Date/Time   LATICACIDVEN 1.07 04/29/2018 1334    MICROBIOLOGY: Recent Results (from the past 240 hour(s))  Culture, blood (routine x 2)     Status: None   Collection Time: 05/15/18  3:15 PM  Result Value Ref Range Status   Specimen Description BLOOD SITE NOT SPECIFIED  Final   Special Requests   Final    BOTTLES DRAWN AEROBIC ONLY Blood Culture results may not be optimal due to an inadequate volume of blood received in culture bottles   Culture   Final    NO GROWTH 5 DAYS Performed at [facility], [address], [place], [place] [address]    Report Status 05/20/2018 FINAL  Final  Culture, blood (routine x 2)     Status: None   Collection Time: 05/15/18  3:22 PM  Result Value Ref Range Status   Specimen Description BLOOD SITE NOT SPECIFIED  Final   Special Requests   Final    BOTTLES DRAWN AEROBIC ONLY Blood Culture results may not be optimal due to an inadequate volume of blood received in culture bottles   Culture   Final    NO GROWTH 5 DAYS Performed at [facility], [address] [place], [place] [address]    Report Status 05/20/2018 FINAL  Final    RADIOLOGY STUDIES/RESULTS: Ct Abdomen Pelvis Wo Contrast  Result Date: 04/30/2018 CLINICAL DATA:  Nausea, bilious vomiting EXAM: CT ABDOMEN AND PELVIS WITHOUT CONTRAST TECHNIQUE: Multidetector CT imaging of the abdomen and pelvis was performed following the standard  protocol without IV contrast. Sagittal and coronal MPR images reconstructed from axial data set. Oral contrast was not administered. COMPARISON:  None FINDINGS: Lower chest: Emphysematous changes with BILATERAL lower lobe infiltrates consistent with either pneumonia or aspiration. Hepatobiliary: Liver unremarkable. Gallbladder not well visualized due to streak artifacts, grossly unremarkable. Pancreas: Normal appearance Spleen: Normal appearance Adrenals/Urinary Tract: Question adrenal thickening bilaterally. No obvious renal mass or hydronephrosis. Ureters not visualized. Foley catheter decompresses urinary bladder. Stomach/Bowel: Nasogastric tube in stomach. Low-attenuation fluid distends stomach and multiple proximal small bowel loops, with small bowel loops up to 4.1 cm diameter. Distal small bowel loops and colon are decompressed. Scattered stool throughout colon. Findings likely represent mid small bowel obstruction. Appendix not visualized. Suspected rectal wall thickening. Vascular/Lymphatic: Atherosclerotic calcification aorta. Aorta normal caliber. Reproductive: Minimal prostatic enlargement and scattered prostatic calcifications. Other: No definite free air or free fluid. Nonspecific stranding of presacral fat. Musculoskeletal: No acute osseous findings. IMPRESSION: Dilated proximal and decompressed distal small bowel loops compatible with small bowel obstruction, likely in the mid small bowel. Due to lack of fat planes, streak artifacts, and lack of contrast, unable to localize site of obstruction. No evidence of perforation/free air. Question rectal wall thickening, recommend correlation with proctoscopy. Emphysematous changes with bibasilar pulmonary infiltrates either representing pneumonia or aspiration. Electronically Signed   By: Ulyses Southward M.D.   On: 04/30/2018 19:38   Dg Abd 1 View  Result Date: 05/20/2018 CLINICAL DATA:  NG tube adjustment EXAM: ABDOMEN - 1 VIEW COMPARISON:  05/20/2018  FINDINGS: Esophageal tube tip and side-port project over gastric fundus, somewhat abrupt appearing curvature at the distal tip with persistent gaseous dilatation. Continued marked gaseous enlargement of bowel in the left abdomen up to 5 cm. Moderate stool in the colon. Residual contrast  within the bladder. IMPRESSION: 1. Esophageal tube tip and side port overlie the gastric fundus, somewhat abrupt curvature of the tip, could be due to mild kinking. 2. Persistent dilatation of stomach and left abdominal small bowel consistent with a bowel obstruction. Electronically Signed   By: Jasmine Pang M.D.   On: 05/20/2018 18:37   Dg Abd 1 View  Result Date: 05/20/2018 CLINICAL DATA:  NG tube placement. EXAM: ABDOMEN - 1 VIEW COMPARISON:  CT abdomen pelvis from same day. FINDINGS: Tip of the NG tube in the stomach with the proximal side port at the gastroesophageal junction. Dilated loops of small bowel in the left abdomen are unchanged. Contrast is seen within the renal collecting systems and bladder. No acute osseous abnormality. IMPRESSION: 1. NG tube tip in the stomach with the proximal side port at the gastroesophageal junction. Consider advancing 2-3 cm. 2. Unchanged small bowel obstruction. Electronically Signed   By: Obie Dredge M.D.   On: 05/20/2018 16:47   Dg Abd 1 View  Result Date: 05/16/2018 CLINICAL DATA:  Feeding tube advanced today. EXAM: ABDOMEN - 1 VIEW COMPARISON:  Plain films of the abdomen dated 05/15/2018 and 05/14/2018. FINDINGS: Enteric tube is coiled in the stomach with tip at the level of the gastric fundus/cardia. Distended gas-filled loops of large and small bowel are again appreciated throughout the abdomen and upper pelvis. IMPRESSION: 1. Feeding tube is coiled in the stomach with tip at the level of the gastric fundus/cardia. 2. Persistent gaseous distention of both large and small bowel loops throughout the abdomen and pelvis, compatible with previous reports of obstruction or  ileus. Electronically Signed   By: Bary Richard M.D.   On: 05/16/2018 20:26   Dg Abd 1 View  Result Date: 05/03/2018 CLINICAL DATA:  Small bowel obstruction EXAM: ABDOMEN - 1 VIEW COMPARISON:  05/02/2018 FINDINGS: NG tube is in the stomach. Gaseous distention of the stomach and left abdominal small bowel loops. Gas and stool within nondistended colon. No free air or organomegaly. IMPRESSION: Gaseous distention of stomach and left abdominal small bowel loops likely reflecting small bowel obstruction. NG tube is in the stomach. Electronically Signed   By: Charlett Nose M.D.   On: 05/03/2018 10:05   Dg Abd 1 View  Result Date: 05/02/2018 CLINICAL DATA:  Small bowel obstruction EXAM: ABDOMEN - 1 VIEW COMPARISON:  May 01, 2018 abdominal radiograph and CT abdomen and pelvis April 30, 2018 FINDINGS: Nasogastric tube no longer present. There is slightly less bowel dilatation in the left upper quadrant compared to 1 day prior. There is moderate air in the stomach. No bowel dilatation elsewhere. No air-fluid levels. No free air. There is moderate stool in the colon. There is atelectatic change in the right lung base. IMPRESSION: Nasogastric tube no longer appreciable. Less small bowel dilatation in the left upper quadrant. Question resolving bowel obstruction. No free air evident. Electronically Signed   By: Bretta Bang III M.D.   On: 05/02/2018 14:20   Ct Head Wo Contrast  Result Date: 04/29/2018 CLINICAL DATA:  Altered level of consciousness increased over last 2 days, 2 episodes of nausea and vomiting, history of prior brain injury, dementia, former smoker, hypertension EXAM: CT HEAD WITHOUT CONTRAST TECHNIQUE: Contiguous axial images were obtained from the base of the skull through the vertex without intravenous contrast. COMPARISON:  06/08/2010 FINDINGS: Brain: Generalized atrophy. Normal ventricular morphology. No midline shift or mass effect. Small vessel chronic ischemic changes of deep cerebral  white matter. 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 No intracranial hemorrhage, mass lesion, evidence of acute infarction, or extra-axial fluid collection. Vascular: Minimal atherosclerotic calcification of internal carotid arteries at skull base Skull: Intact Sinuses/Orbits: Clear Other: N/A IMPRESSION: Atrophy with small vessel chronic ischemic changes of deep cerebral white matter. 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 No acute intracranial abnormalities. Electronically Signed   By: Ulyses Southward M.D.   On: 04/29/2018 09:58   Ct Abdomen Pelvis W Contrast  Result Date: 05/20/2018 CLINICAL DATA:  Nausea and vomiting. 10 days status post laparoscopic lysis of adhesions and bowel resection. EXAM: CT ABDOMEN AND PELVIS WITH CONTRAST TECHNIQUE: Multidetector CT imaging of the abdomen and pelvis was performed using the standard protocol following bolus administration of intravenous contrast. CONTRAST:  ISOVUE-300 IOPAMIDOL (ISOVUE-300) INJECTION 61% COMPARISON:  04/30/2018 FINDINGS: Lower Chest: Decreased airspace disease seen in both lower lobes, likely due to resolving pneumonia. Hepatobiliary: A few tiny left hepatic lobe cysts are seen. No hepatic masses identified. Tiny calcified gallstones or layering sludge noted, however there is no evidence of cholecystitis or biliary ductal dilatation. Pancreas:  No mass or inflammatory changes. Spleen: Within normal limits in size and appearance. Adrenals/Urinary Tract: No masses identified. No evidence of hydronephrosis. Stomach/Bowel: Small amount of free air is seen, likely postoperative in etiology. No abscess identified. Multiple moderately dilated fluid-filled small bowel loops are seen in the left abdomen, with transition to nondilated small bowel in the left lower quadrant in area of surgical staples. This is consistent with a small-bowel obstruction. No mass or inflammatory process identified. Moderate amount of stool again seen in  the right colon. Vascular/Lymphatic: No pathologically enlarged lymph nodes. No abdominal aortic aneurysm. Reproductive: Mild-to-moderate enlargement of prostate gland shows no significant change. Other: Subcutaneous emphysema is seen in the left anterior abdominal wall, likely postoperative in etiology. Musculoskeletal:  No suspicious bone lesions identified. IMPRESSION: Small bowel obstruction, with transition point in left lower quadrant. This may be due to adhesion, as no obstructing mass or inflammatory process identified. Small amount of postop intraperitoneal air and subcutaneous emphysema. No evidence of abscess. Cholelithiasis.  No radiographic evidence of cholecystitis. Decreased bilateral lower lobe airspace disease, consistent with resolving pneumonia or inflammatory process. Electronically Signed   By: Myles Rosenthal M.D.   On: 05/20/2018 13:41   Dg Chest Port 1 View  Result Date: 05/15/2018 CLINICAL DATA:  Shortness of breath. Patient 5 days postop bowel resection with lysis of adhesions. EXAM: PORTABLE CHEST 1 VIEW COMPARISON:  05/11/2018 FINDINGS: Right-sided PICC line with tip over the SVC. Lungs are adequately inflated with subtle left retrocardiac opacification which may be due to vascular crowding or atelectasis and less likely infection. Remainder of the lungs are clear. Cardiomediastinal silhouette is within normal. Persistent evidence of free peritoneal air compatible with patient's recent surgery. IMPRESSION: Mild left base opacification likely atelectasis or vascular crowding and less likely infection. Persistent free peritoneal air compatible recent postop state. Right-sided PICC line with tip over the SVC. Electronically Signed   By: Elberta Fortis M.D.   On: 05/15/2018 09:38   Dg Chest Port 1 View  Result Date: 05/11/2018 CLINICAL DATA:  Shortness of breath, hypertension, pneumonia, dementia, former smoker EXAM: PORTABLE CHEST 1 VIEW COMPARISON:  Portable exam 0945 hours compared to  05/09/2018 FINDINGS: Nasogastric tube extends into stomach. RIGHT arm PICC line tip projects over SVC. Normal heart size and pulmonary vascularity. Elongation of thoracic aorta. Lungs appear emphysematous but clear. No pleural effusion or pneumothorax. Bones demineralized.  Free air identified under the hemidiaphragms bilaterally; EHR indicates patient underwent a laparoscopic procedure on 05/10/2018, small-bowel resection and lysis of adhesions for small-bowel obstruction prior operative note. IMPRESSION: COPD changes without infiltrate. Free air consistent with abdominal surgery 1 day ago. Electronically Signed   By: Ulyses Southward M.D.   On: 05/11/2018 10:21   Dg Chest Port 1 View  Result Date: 05/09/2018 CLINICAL DATA:  Cough EXAM: PORTABLE CHEST 1 VIEW COMPARISON:  05/04/2018 FINDINGS: NG tube is stable. Right upper extremity PICC placed. Tip is at the cavoatrial junction. Lungs remain hyperaerated and clear. Small right pleural effusion is stable. No pneumothorax. Aorta remains somewhat prominent due to tortuosity and rotation of the thorax. IMPRESSION: New right upper extremity PICC with its tip at the cavoatrial junction. No active cardiopulmonary disease. Electronically Signed   By: Jolaine Click M.D.   On: 05/09/2018 07:37   Dg Chest Port 1 View  Result Date: 04/29/2018 CLINICAL DATA:  Altered mental status EXAM: PORTABLE CHEST 1 VIEW COMPARISON:  April 12, 2018 FINDINGS: There is mild scarring in the lateral right base and in the medial right lower lung region. There is no edema or consolidation. The heart size and pulmonary vascularity are normal. No adenopathy. There is a recent appearing fracture of the posterior right seventh rib with alignment essentially anatomic. An older fracture of the posterior right eighth rib is noted. No pneumothorax. IMPRESSION: Recent appearing fracture posterior right seventh rib. Older fracture posterior right eighth rib noted. Areas of mild scarring noted on the  right. No edema or consolidation. Stable cardiac silhouette. Electronically Signed   By: Bretta Bang III M.D.   On: 04/29/2018 08:46   Dg Chest Port 1v Same Day  Result Date: 05/01/2018 CLINICAL DATA:  history of schizophrenia, hypertension, BPH-mostly wheelchair-bound-presented to the hospital with approximately 1 week history of intermittent vomiting, confusion for a few days prior to this hospital admission-presented with sepsis secondary to UTI, and significant constipation. C/o SOB EXAM: PORTABLE CHEST - 1 VIEW SAME DAY COMPARISON:  04/29/2018 FINDINGS: Lungs are hyperinflated. Subsegmental atelectasis or early infiltrate at the left lung base, new since previous. Right lung clear. Heart size normal.  Tortuous ectatic thoracic aorta. No effusion. No pneumothorax. Right anterolateral fourth rib fracture, age indeterminate. Nasogastric tube extends to the stomach. IMPRESSION: 1. New patchy infiltrate or subsegmental atelectasis at the left lung base. 2. Right fourth rib fracture without pneumothorax. 3. Nasogastric tube placement to the stomach. Electronically Signed   By: Corlis Leak M.D.   On: 05/01/2018 08:24   Dg Abd 2 Views  Result Date: 05/05/2018 CLINICAL DATA:  Vomiting.  History of right lumpectomy. EXAM: ABDOMEN - 2 VIEW COMPARISON:  05/04/2018 FINDINGS: Improve dilation of small bowel loops with residual borderline gas-filled distended small bowel loops in the left upper quadrant of the abdomen. Normal colonic bowel gas pattern. No evidence of free intra-abdominal gas. Normal cardiac silhouette. Tortuosity of the thoracic aorta. Right pleural reflection versus small pleural effusion. No lobar airspace consolidation. Enteric catheter tip within the expected location the gastric body, side hole at the GE junction level. Right PICC line terminates in the expected location of the cavoatrial junction. IMPRESSION: Improved appearance of small-bowel obstruction. Enteric catheter with side hole  at the level of the GE junction. Advancement may be considered. Possible small right pleural effusion. Electronically Signed   By: Ted Mcalpine M.D.   On: 05/05/2018 12:51   Dg Abd Acute W/chest  Result Date: 05/04/2018 CLINICAL DATA:  Follow-up small bowel obstruction. EXAM: DG ABDOMEN ACUTE W/ 1V CHEST COMPARISON:  Abdominal x-rays 05/04/1999 19 dating back to 04/29/2018. CT abdomen and pelvis 04/30/2018. Chest x-rays 05/01/2018, 04/12/2018. FINDINGS: Nasogastric tube tip in the fundus of the stomach. Persistent marked gaseous distension of the jejunum in the LEFT UPPER QUADRANT, slowly progressive over the past 3 days, with air-fluid levels on the LATERAL decubitus image. No evidence of free intraperitoneal air. Normal caliber colon with moderate stool burden. Calcified uterine fibroid in the pelvic midline. Cardiac silhouette normal in size, unchanged. Thoracic aorta tortuous and atherosclerotic. Hilar and mediastinal contours otherwise unremarkable. Interval improvement in aeration in the LEFT lung base, with only minimal patchy opacities persisting. Lungs otherwise clear. Emphysematous changes in both lungs with scarring in the RIGHT mid lung, unchanged. Pleuroparenchymal scarring at the RIGHT lung base which accounts for the blunting of the costophrenic angle, unchanged dating back to the original 04/12/2018 examination. IMPRESSION: 1. Persistent partial small bowel obstruction which has slowly worsened over the past 3 days. 2. No evidence of free intraperitoneal air. 3. Improved aeration in the LEFT LOWER LOBE, with only mild atelectasis and/or pneumonia persisting. Electronically Signed   By: Hulan Saas M.D.   On: 05/04/2018 09:28   Dg Abd Portable 1v  Result Date: 05/24/2018 CLINICAL DATA:  Follow-up small bowel obstruction. EXAM: PORTABLE ABDOMEN - 1 VIEW COMPARISON:  Portable supine abdominal radiograph of May 21, 2018 FINDINGS: The colonic stool burden is moderate. There are  loops of mildly distended gas-filled small bowel to the left of midline. There is gas and stool in the rectum. There is no free extraluminal gas. The esophagogastric tube tip in proximal port project below the GE junction. The bony structures exhibit no acute abnormality. IMPRESSION: There are a few loops of mildly distended gas-filled small bowel to the left of midline. The colonic stool burden and gas pattern is within the limits of normal. There is no evidence of perforation. Electronically Signed   By: Jaymere  [place] M.D.   On: 05/24/2018 09:25   Dg Abd Portable 1v-small Bowel Obstruction Protocol-initial, 8 Hr Delay  Result Date: 05/21/2018 CLINICAL DATA:  Small-bowel obstruction.  Follow-up exam. EXAM: PORTABLE ABDOMEN - 1 VIEW COMPARISON:  05/20/2018 FINDINGS: Significant improvement in small bowel dilation is noted since the prior study. The small bowel obstruction has essentially resolved radiographically. Mild generalized increased stool burden in the colon, unchanged from the previous exam. Midline abdominal wall surgical staples are stable. Nasogastric tube is well positioned, tip in the mid stomach. IMPRESSION: 1. Significant interval improvement from the previous day's exam. Small-bowel obstruction appears resolved radiographically. Electronically Signed   By: Amie Portland M.D.   On: 05/21/2018 13:12   Dg Abd Portable 1v  Result Date: 05/17/2018 CLINICAL DATA:  Small-bowel obstruction. EXAM: PORTABLE ABDOMEN - 1 VIEW COMPARISON:  05/16/2018.  05/15/2018.  CT 04/30/2018. FINDINGS: Surgical staples noted over the abdomen. NG tube noted with its tip in the stomach. Distended loops of small and large bowel are again noted. Similar findings noted on prior exam. No free air. Degenerative change in osteopenia lumbar spine and both hips. Prostate calcifications are noted. Aortoiliac atherosclerotic vascular calcification. Diffuse osteopenia degenerative changes lumbar spine and both hips. Mild  blunting of the right costophrenic angle again noted suggesting scarring and/or tiny effusion. IMPRESSION: NG tube noted with its tip in the stomach. Dilated loops of small and large bowel are again noted without significant interim change. No free air identified. Electronically Signed   By:  Thomas  Register   On: 05/17/2018 07:32   Dg Abd Portable 1v  Result Date: 05/15/2018 CLINICAL DATA:  NG tube placement EXAM: PORTABLE ABDOMEN - 1 VIEW COMPARISON:  05/15/2018 FINDINGS: NG tube is in placed into the fundus of the stomach. Continued gaseous distention of visualized upper abdominal bowel loops. IMPRESSION: NG tube in the fundus of the stomach Electronically Signed   By: Charlett Nose M.D.   On: 05/15/2018 20:11   Dg Abd Portable 1v  Result Date: 05/15/2018 CLINICAL DATA:  Small bowel obstruction. EXAM: PORTABLE ABDOMEN - 1 VIEW COMPARISON:  One-view abdomen 05/14/2018 FINDINGS: Patient's retained left.  Heart size normal.  Lung bases are clear. Extend loops of large and small bowel are stable. There is no free air or pneumatosis. Surgical clips are noted. IMPRESSION: 1. Similar appearance of distended large and small bowel consistent with obstruction or ileus. Electronically Signed   By: Marin Roberts M.D.   On: 05/15/2018 08:10   Dg Abd Portable 1v  Result Date: 05/14/2018 CLINICAL DATA:  Abdominal pain EXAM: PORTABLE ABDOMEN - 1 VIEW COMPARISON:  05/13/2018 FINDINGS: Interval NG tube removal. Some interval improvement in gaseous distention of the bowel. Stool and air throughout the transverse colon. Postop staples again noted. Air in the rectum. No significant interval change. Degenerative changes of the spine. Basilar chronic interstitial changes and atelectasis. IMPRESSION: NG tube removed. Stable nonspecific bowel gas pattern, suspect resolving obstruction or ileus. Little interval change. Electronically Signed   By: Judie Petit.  Shick M.D.   On: 05/14/2018 10:23   Dg Abd Portable 1v  Result  Date: 05/13/2018 CLINICAL DATA:  Abdominal pain, check NG placement EXAM: PORTABLE ABDOMEN - 1 VIEW COMPARISON:  05/09/2018 FINDINGS: Nasogastric catheter is noted with the tip in the stomach. Proximal side port lies in the distal esophagus. This is stable from the prior exam. Scattered large and small bowel gas is noted. Postsurgical changes are now seen. Mild small bowel dilatation is noted which may be related to a postoperative ileus. IMPRESSION: Mild postoperative ileus. Nasogastric catheter as described. Electronically Signed   By: Alcide Clever M.D.   On: 05/13/2018 09:56   Dg Abd Portable 1v  Result Date: 05/09/2018 CLINICAL DATA:  Small-bowel obstruction EXAM: PORTABLE ABDOMEN - 1 VIEW COMPARISON:  05/08/2018; 05/06/2018; 05/05/2018 FINDINGS: Re-demonstrated gaseous distention of several loops of small bowel with index loop of small bowel within the left mid hemiabdomen measuring 3.6 cm in diameter. Moderate colonic stool burden. No supine evidence of pneumoperitoneum. No pneumatosis or portal venous gas. Presumed calcified fibroid overlies the left hemipelvis. Stable position of support apparatus. No acute osseus abnormalities. IMPRESSION: No change to slight improvement in suspected small-bowel obstruction. Electronically Signed   By: Simonne Come M.D.   On: 05/09/2018 07:47   Dg Abd Portable 1v  Result Date: 05/08/2018 CLINICAL DATA:  Follow up small bowel obstruction EXAM: PORTABLE ABDOMEN - 1 VIEW COMPARISON:  05/06/2018 FINDINGS: Scattered large and small bowel gas is noted. Multiple dilated loops of small bowel are again identified and stable in appearance. Nasogastric catheter is again noted within the stomach. No free air is seen. Degenerative changes of the lumbar spine are noted. IMPRESSION: Persistent small bowel dilatation. No new focal abnormality is noted. Electronically Signed   By: Alcide Clever M.D.   On: 05/08/2018 08:36   Dg Abd Portable 1v  Result Date: 05/06/2018 CLINICAL  DATA:  Small bowel obstruction EXAM: PORTABLE ABDOMEN - 1 VIEW COMPARISON:  Portable exam at 0628 hours  compared to 05/05/2018 FINDINGS: Stool scattered throughout colon. Persistent dilatation of small bowel loops in the LEFT mid abdomen up to 4.9 cm diameter. No bowel wall thickening. Bones demineralized. No definite renal calcifications. IMPRESSION: Persistent small bowel dilatation consistent with small bowel obstruction. Little interval change. Electronically Signed   By: Ulyses Southward M.D.   On: 05/06/2018 10:21   Dg Abd Portable 1v-small Bowel Obstruction Protocol-initial, 8 Hr Delay  Result Date: 05/04/2018 CLINICAL DATA:  Small bowel obstruction protocol. 8 hour delay. EXAM: PORTABLE ABDOMEN - 1 VIEW COMPARISON:  05/03/2018 FINDINGS: Gaseous distention of left upper quadrant small bowel. Enteric tube with tip projected over the mid abdomen consistent with location in the upper stomach. Contrast material is demonstrated in the dilated small bowel. No contrast material is identified in the colon. This is suggestive of high-grade obstruction. Degenerative changes in the spine. IMPRESSION: Contrast material is demonstrated in the dilated small bowel but not in the colon consistent with high-grade obstruction. Electronically Signed   By: Burman Nieves M.D.   On: 05/04/2018 01:55   Dg Abd Portable 1v-small Bowel Obstruction Protocol-initial, 8 Hr Delay  Result Date: 05/01/2018 CLINICAL DATA:  Small bowel obstruction. EXAM: PORTABLE ABDOMEN - 1 VIEW COMPARISON:  Radiograph earlier this day at 1109 hour, CT yesterday. FINDINGS: Enteric tube in place with tip in the stomach, side-port just beyond the gastroesophageal junction. Improving small bowel dilatation in the left abdomen. Moderate stool in the right and transverse colon. No evidence of free air. Pelvic calcification may be stone in the urinary bladder or exophytic prostate calcification. IMPRESSION: Improving small bowel dilatation in the left  abdomen since radiograph earlier this day. Electronically Signed   By: Narda Rutherford M.D.   On: 05/01/2018 21:14   Dg Abd Portable 1v-small Bowel Protocol-position Verification  Result Date: 05/01/2018 CLINICAL DATA:  Nasogastric tube placement EXAM: PORTABLE ABDOMEN - 1 VIEW COMPARISON:  Portable exam 1109 hours compared to 04/30/2018 FINDINGS: Tip of nasogastric tube projects over mid stomach. Dilated small bowel loops are seen in the LEFT mid abdomen. Increased stool in RIGHT colon. Lung bases appear emphysematous with subsegmental atelectasis at LEFT base. Bones demineralized. IMPRESSION: Tip of nasogastric tube projects over mid stomach. Small bowel dilatation in LEFT mid abdomen with increased stool in RIGHT colon. Electronically Signed   By: Ulyses Southward M.D.   On: 05/01/2018 11:22   Dg Abd Portable 1v  Result Date: 04/30/2018 CLINICAL DATA:  67 year old male with vomiting. EXAM: PORTABLE ABDOMEN - 1 VIEW COMPARISON:  04/29/2018 abdominal radiographs. FINDINGS: Portable AP semi upright and supine views at 0847 hours. Enteric tube has been placed and the stomach now appears decompressed. NG tube side hole at the gastric body. Non obstructed bowel gas pattern. Moderate volume of retained stool redemonstrated in the transverse colon and distal sigmoid/rectum. Negative visible lung bases. No pneumoperitoneum. Stable abdominal and pelvic visceral contours. Dystrophic calcification in the pelvis. Stable pelvic catheter, likely a Foley. No acute osseous abnormality identified. IMPRESSION: 1. NG tube in place with resolved gastric distention. 2. Non obstructed bowel gas pattern with moderate volume of retained stool. Electronically Signed   By: Odessa Fleming M.D.   On: 04/30/2018 09:06   Dg Abd Portable 1v  Result Date: 04/29/2018 CLINICAL DATA:  Nausea and vomiting EXAM: PORTABLE ABDOMEN - 1 VIEW COMPARISON:  None. FINDINGS: Gaseous distention of the stomach. Formed stool throughout the colon. No concerning  mass effect or calcification. IMPRESSION: Prominent gaseous distension of the stomach. Constipated appearance. Electronically  Signed   By: Marnee Spring M.D.   On: 04/29/2018 14:44   [place] Ekg Site Rite  Result Date: 05/04/2018 If Site Rite image not attached, placement could not be confirmed due to current cardiac rhythm.    LOS: 26 days   Signature  Susa Raring M.D on 05/25/2018 at 9:07 AM   -  To page go to [email or web address] - password [facility]

## 2018-05-26 LAB — GLUCOSE, CAPILLARY
GLUCOSE-CAPILLARY: 94 mg/dL (ref 70–99)
GLUCOSE-CAPILLARY: 82 mg/dL (ref 70–99)
Glucose-Capillary: 106 mg/dL — ABNORMAL HIGH (ref 70–99)
Glucose-Capillary: 94 mg/dL (ref 70–99)

## 2018-05-26 LAB — PHOSPHORUS: Phosphorus: 5.3 mg/dL — ABNORMAL HIGH (ref 2.5–4.6)

## 2018-05-26 LAB — BASIC METABOLIC PANEL
Anion gap: 4 — ABNORMAL LOW (ref 5–15)
BUN: 69 mg/dL — ABNORMAL HIGH (ref 8–23)
CHLORIDE: 109 mmol/L (ref 98–111)
CO2: 25 mmol/L (ref 22–32)
Calcium: 9.3 mg/dL (ref 8.9–10.3)
Creatinine, Ser: 0.87 mg/dL (ref 0.61–1.24)
GFR calc non Af Amer: >60 mL/min (ref >60)
Glucose, Bld: 87 mg/dL (ref 70–99)
Potassium: 5 mmol/L (ref 3.5–5.1)
SODIUM: 138 mmol/L (ref 135–145)

## 2018-05-26 MED ORDER — TRAVASOL 10 % IV SOLN
INTRAVENOUS | Status: AC
  Administered 2018-05-26: 18:00:00 via INTRAVENOUS
  Filled 2018-05-26: qty 936

## 2018-05-26 MED ORDER — POLYETHYLENE GLYCOL 3350 17 G PO PACK
17.0000 g | PACK | Freq: Every day | ORAL | Status: DC
  Administered 2018-05-26 – 2018-06-03 (×8): 17 g via ORAL
  Filled 2018-05-26 (×9): qty 1

## 2018-05-26 MED ORDER — SODIUM CHLORIDE 0.9 % IV BOLUS
500.0000 mL | Freq: Once | INTRAVENOUS | Status: AC
  Administered 2018-05-26: 500 mL via INTRAVENOUS

## 2018-05-26 MED ORDER — FUROSEMIDE 10 MG/ML IJ SOLN
40.0000 mg | Freq: Once | INTRAMUSCULAR | Status: AC
  Administered 2018-05-26: 40 mg via INTRAVENOUS
  Filled 2018-05-26: qty 4

## 2018-05-26 MED ORDER — SORBITOL 70 % SOLN
960.0000 mL | TOPICAL_OIL | Freq: Once | ORAL | Status: AC
  Administered 2018-05-26: 960 mL via RECTAL
  Filled 2018-05-26: qty 473

## 2018-05-26 NOTE — Progress Notes (Signed)
PHARMACY - ADULT TOTAL PARENTERAL NUTRITION CONSULT NOTE   Pharmacy Consult: TPN Indication: SBO  Patient Measurements: Height: 5\' 6"  (167.6 cm) Weight: 110 lb (49.9 kg) IBW/kg (Calculated) : 63.8 TPN AdjBW (KG): 49.9 Body mass index is 17.75 kg/m.  Assessment:  22 YOM admitted on 10/4 with sepsis. Found to have PNA and a possible SBO on 10/5 CT. Pt has a history of SBO s/p colectomy in 1993 (history (in the 1990s) of trauma causing small bowel injury requiring laparotomy). An NG tube was placed with improving small bowel dilatation but patient had 3-4 episodes of vomiting overnight on 10/7-10/8 and the NG tube had to be replaced due to incorrect placement. Pharmacy now consulted to manage TPN due to worsening SBO and concern for adhesions that require surgery.  Actual body is significantly below ideal body weight.  Of note, patient is extremely cachectic. Patient hx unreliable due to mental status.  GI: s/p ex-lap with LOA, SBR and anastomosis on 10/15. NG O/P down to (bilous on 10/30). Abd xray shows moderate stool burden, SMOG 10/30, LBM 10/30, refusing Miralax Endo: No hx DM - CBGs controlled, SSI d/c'd 10/14 Lytes: K down to 5, Phos 5.3, CoCa 10.34 (Ca x Phos = 54.8, goal < 55) Renal: SCr down to 0.87, BUN up to 69 - UOP 0.7 ml/kg/hr, LR at [facility] Pulm: stable on RA Cards: VSS - Lasix x1 on 10/30 Hepatobil: LFTs / tbili / TG WNL Neuro: Schizophrenia/dementia. Benztropine, Zyprexa, IV valproate, PRN Toradol. Can't really make appropriate decisions ID: s/p abx for Klebsiella UTI and aspiration PNA - afebrile, WBC WNL TPN Access: double lumen PICC placed 05/05/18 TPN start date: 05/05/18  Nutritional Goals (per RD rec on 10/21): 1550-1750kCal, 85-100 gm protein, >1.5 L fluid per day  Current Nutrition:  TPN  Plan:  Continue TPN at 65 ml/hr, providing 94g AA, 218g CHO and 47g ILE for a total of 1585 kCals per day, meeting 100% of patient needs Electrolytes in TPN: reduce K,  Ca, Mag and Phos, Cl:Ac 1:1 Add MVI, trace elements, Pepcid 20mg  in TPN F/U AM labs, plans for advancing diet / weaning TPN   Jeremia Groot D. Laney Potash, PharmD, BCPS, BCCCP 05/26/2018, 9:01 AM

## 2018-05-26 NOTE — Progress Notes (Signed)
Attempted to replace securing device for NG tube and pt refused. Pt also refused oral care. Educated pt on why these things need to be done, but he continued to adamantly refuse.

## 2018-05-26 NOTE — Progress Notes (Signed)
PROGRESS NOTE        PATIENT DETAILS Name: Jeffrey Davila Age: 67 y.o. Sex: male Date of Birth: [birth year] Admit Date: 04/29/2018 Admitting Physician Starleen Arms, MD PCP:Uba, Reuel Boom, MD  Brief Narrative: Patient is a 67 y.o. male with history of schizophrenia/?  Cognitive dysfunction (dementia), hypertension, BPH, remote history (in the 1990s) of trauma causing small bowel injury requiring laparotomy-mostly wheelchair-bound-presented to the hospital with approximately 1 week history of intermittent vomiting, confusion for a few days prior to this hospital admission-presented with sepsis secondary to UTI, vomiting and significant constipation.  Patient was started on IV antibiotics and admitted to the hospital service,initial imaging was suggestive of constipation causing gastric distention-NG tube was placed, however upon further evaluation with a CT scan he was found  to have a small bowel obstruction.  General surgery was consulted, even with n.p.o. status/NG tube decompression-he continues to have SBO-patient's sister has refused to sign consent for surgery and wanted patient to receive several days of TNA before she consented to surgery.  Subsequently underwent laparotomy with lysis of adhesions on 10/15, postoperative course complicated by ileus and low-grade fever.  See below for further details.  Subjective:  In bed, no chest pain, but says yes to abdominal pain, says yes to BM, unreliable historian.  Assessment/Plan:  Small bowel obstruction: Underwent laparotomy with lysis of adhesions 05/10/2018 - postoperative course complicated by ileus and likely repeat obstruction as per CT scan done on 05/20/2018 with a left lower quadrant transition point, complicated by lack of activity and baseline wheelchair/bedbound status, no surgery monitoring and following will defer management of this problem to general surgery.    Currently being managed conservatively  with bowel rest, still has significant return with NG tube on 05/24/2018 Case discussed with surgery PA on 05/26/2018 he was given smog enema on 05/25/2018 with limited success, surgery is going to try MiraLAX to the NG tube on 05/26/2018 and monitor. Continue with IV TNA and monitor, minimize narcotic use, monitor electrolytes.  Encouraged to sit up in the chair and increase activity.  Low-grade fever on 10/19: No foci of infection apparent-could have been from atelectasis-not on any antimicrobial therapy.  Blood culture on 10/20 neg so far, chest x-ray on 10/20 neg for PNA.  Remains afebrile since then.  Follow.  Sepsis secondary to complicated UTI: Sepsis pathophysiology has resolved, urine culture positive for Klebsiella-has completed a course of IV Rocephin.  Blood cultures remain negative.    Aspiration pneumonia: This occurred in the early part of his hospitalization-felt to be secondary to o SBO/vomiting with underlying frailty.  Has completed a course of antimicrobial therapy.  Continue to mobilize as much as possible.    Acute metabolic encephalopathy: Improved and back to his usual baseline.  Encephalopathy was present on initial presentation-this was felt secondary to UTI, and developing SBO.Marland Kitchen  Anemia: Suspect anemia secondary to acute illness, and IV fluid dilution.  Has been transfused a total of 3 units so far, last transfusion on 10/20.  Follow CBC periodically.  No indication of any overt blood loss at this time.   AKI: Hemodynamically mediated, resolved.  Schizophrenia: Appears stable-continue IV Depakote and olanzapine.    Hypertension: Blood pressure appears stable-continue to hold antihypertensives.   Severe malnutrition: Continue TNA-we will start low-dose nutritional supplements when diet is more stable.    Deconditioning/debility: Suspect has  significant amount of debility at baseline-he is very cachectic-deconditioning/debility has worsened due to acute illness/SBO.  PT  following.  Hyperkalemia - resolved after 1 dose of Lasix will monitor.  Other issues:  Patient's sister initially wanted guarantees with surgery, wanted transfer to [facility] (refused by [facility]).  See my prior notes.  I have reviewed notes by Dr. Marena Chancy issues with patient's sister refusing NG tube placement, wanting transfer to [facility] ([facility] [place] [facility]  Per nursing staff-she has on occasion been very disruptive to their workflow and in the care of the patient.  I completely agree with Dr. Marena Chancy concerns whether patient's sister is the best person to be making decisions for this patient-given numerous documentations by nursing staff and by prior MDs.  Social work, risk Insurance account manager, Camera operator and palliative care following.   Please  see my note from 05/17/2018.   DVT Prophylaxis:  Prophylactic Heparin   Code Status: Full code   Family Communication:  Sister bedside on 05/22/2018, 05/24/18 with nursing staff present  Disposition Plan:  Remain inpatient-ultimately to SNF when he is more stable.  Antimicrobial agents: Anti-infectives (From admission, onward)   Start     Dose/Rate Route Frequency Ordered Stop   05/10/18 0941  ceFAZolin (ANCEF) 2-4 GM/100ML-% IVPB    Note to Pharmacy:  [name]   : cabinet override      05/10/18 0941 05/10/18 2144   05/01/18 1215  metroNIDAZOLE (FLAGYL) IVPB 500 mg  Status:  Discontinued     500 mg 100 mL/hr over 60 Minutes Intravenous Every 8 hours 05/01/18 1201 05/06/18 1153   04/30/18 1100  cefTRIAXone (ROCEPHIN) 2 g in sodium chloride 0.9 % 100 mL IVPB  Status:  Discontinued     2 g 200 mL/hr over 30 Minutes Intravenous Every 24 hours 04/29/18 1416 05/06/18 1153   04/29/18 1430  cefTRIAXone (ROCEPHIN) 1 g in sodium chloride 0.9 % 100 mL IVPB     1 g 200 mL/hr over 30 Minutes Intravenous Every 24 hours 04/29/18 1416 04/30/18 1429   04/29/18 1130  cefTRIAXone (ROCEPHIN) 1 g in sodium chloride  0.9 % 100 mL IVPB     1 g 200 mL/hr over 30 Minutes Intravenous  Once 04/29/18 1118 04/29/18 1251      Procedures: None  CONSULTS:  None  Time spent: 25 minutes   MEDICATIONS: Scheduled Meds: . benztropine  1 mg Oral BID  . Chlorhexidine Gluconate Cloth  6 each Topical 2 times per day on Tue  . heparin  5,000 Units Subcutaneous Q8H  . lip balm  1 application Topical BID  . OLANZapine zydis  10 mg Oral BID  . polyethylene glycol  17 g Oral Daily  . polyvinyl alcohol  1 drop Both Eyes TID  . sorbitol, milk of mag, mineral oil, glycerin (SMOG) enema  960 mL Rectal Once   Continuous Infusions: . sodium chloride 500 mL (05/25/18 0911)  . lactated ringers 10 mL/hr at 05/11/18 0617  . TPN ADULT (ION) 65 mL/hr at 05/25/18 1729  . TPN ADULT (ION)    . valproate sodium 250 mg (05/26/18 0600)   PRN Meds:.sodium chloride, acetaminophen, albuterol, alum & mag hydroxide-simeth, bisacodyl, diphenhydrAMINE, ketorolac, magic mouthwash, menthol-cetylpyridinium, metoprolol tartrate, ondansetron (ZOFRAN) IV, phenol, sodium chloride flush, traMADol   PHYSICAL EXAM: Vital signs: Vitals:   05/25/18 0555 05/25/18 1551 05/25/18 2130 05/26/18 0439  BP: 95/65 97/64 94/67  99/71  Pulse: 81  78 78  Resp: 17 16 18  18  Temp: 98.4 F (36.9 C) 97.8 F (36.6 C) 97.6 F (36.4 C) 98.1 F (36.7 C)  TempSrc: Oral Oral Oral Oral  SpO2: 98% 99% 100% 100%  Weight:      Height:       Filed Weights   04/29/18 0842  Weight: 49.9 kg   Body mass index is 17.75 kg/m.   Exam  Awake Alert, no distress with NG tube in place [place].AT,PERRAL Supple Neck,No JVD, No cervical lymphadenopathy appriciated.  Symmetrical Chest wall movement, Good air movement bilaterally, CTAB RRR,No Gallops, Rubs or new Murmurs, No Parasternal Heave Hypoactive +ve B.Sounds, Abd Soft, No tenderness, No organomegaly appriciated, No rebound - guarding or rigidity. No Cyanosis, Clubbing or edema, No new Rash or bruise   I have  personally reviewed following labs and imaging studies  LABORATORY DATA: CBC: Recent Labs  Lab 05/21/18 0306 05/22/18 0400 05/23/18 0347 05/24/18 0908  WBC 12.4* 12.1*  --  13.4*  NEUTROABS  --   --  7.1  --   HGB 9.3* 9.5*  --  10.2*  HCT 30.3* 30.8*  --  33.0*  MCV 92.9 92.8  --  93.8  PLT 579* 546*  --  524*    Basic Metabolic Panel: Recent Labs  Lab 05/20/18 0402 05/21/18 0306 05/22/18 0400 05/23/18 0347 05/24/18 0908 05/25/18 0411 05/26/18 0445  NA 137 136 138 135 136 136 138  K 4.5 4.7 4.9 4.5 4.8 5.2* 5.0  CL 105 105 102 105 104 108 109  CO2 25 24 24 25 24 24 25   GLUCOSE 107* 108* 99 93 105* 98 87  BUN 25* 25* 33* 37* 41* 61* 69*  CREATININE 0.70 0.69 0.78 0.72 0.73 0.95 0.87  CALCIUM 9.0 9.3 9.7 9.4 9.7 9.5 9.3  MG 2.1 2.2 2.3 2.1 2.3  --   --   PHOS  --   --   --  4.3  --   --  5.3*    GFR: Estimated Creatinine Clearance: 58.9 mL/min (by C-G formula based on SCr of 0.87 mg/dL).  Liver Function Tests: Recent Labs  Lab 05/20/18 0402 05/21/18 0306 05/22/18 0400 05/23/18 0347  AST 12* 10* 10* 10*  ALT 14 11 10 8   ALKPHOS 134* 129* 127* 114  BILITOT 0.3 0.2* 0.2* 0.3  PROT 6.6 6.7 6.9 7.1  ALBUMIN 2.5* 2.5* 2.6* 2.7*   No results for input(s): LIPASE, AMYLASE in the last 168 hours. No results for input(s): AMMONIA in the last 168 hours.  Coagulation Profile: No results for input(s): INR, PROTIME in the last 168 hours.  Cardiac Enzymes: No results for input(s): CKTOTAL, CKMB, CKMBINDEX, TROPONINI in the last 168 hours.  BNP (last 3 results) No results for input(s): PROBNP in the last 8760 hours.  HbA1C: No results for input(s): HGBA1C in the last 72 hours.  CBG: Recent Labs  Lab 05/25/18 0015 05/25/18 0755 05/25/18 1807 05/26/18 0030 05/26/18 0922  GLUCAP 97 102* 93 94 94    Lipid Profile: No results for input(s): CHOL, HDL, LDLCALC, TRIG, CHOLHDL, LDLDIRECT in the last 72 hours.  Thyroid Function Tests: No results for  input(s): TSH, T4TOTAL, FREET4, T3FREE, THYROIDAB in the last 72 hours.  Anemia Panel: No results for input(s): VITAMINB12, FOLATE, FERRITIN, TIBC, IRON, RETICCTPCT in the last 72 hours.  Urine analysis:    Component Value Date/Time   COLORURINE YELLOW 05/15/2018 1109   APPEARANCEUR CLEAR 05/15/2018 1109   LABSPEC 1.013 05/15/2018 1109   PHURINE 7.0 05/15/2018 1109  GLUCOSEU NEGATIVE 05/15/2018 1109   HGBUR SMALL (A) 05/15/2018 1109   BILIRUBINUR NEGATIVE 05/15/2018 1109   KETONESUR NEGATIVE 05/15/2018 1109   PROTEINUR NEGATIVE 05/15/2018 1109   UROBILINOGEN 1.0 06/08/2010 1523   NITRITE NEGATIVE 05/15/2018 1109   LEUKOCYTESUR TRACE (A) 05/15/2018 1109    Sepsis Labs: Lactic Acid, Venous    Component Value Date/Time   LATICACIDVEN 1.07 04/29/2018 1334    MICROBIOLOGY: No results found for this or any previous visit (from the past 240 hour(s)).  RADIOLOGY STUDIES/RESULTS: Ct Abdomen Pelvis Wo Contrast  Result Date: 04/30/2018 CLINICAL DATA:  Nausea, bilious vomiting EXAM: CT ABDOMEN AND PELVIS WITHOUT CONTRAST TECHNIQUE: Multidetector CT imaging of the abdomen and pelvis was performed following the standard protocol without IV contrast. Sagittal and coronal MPR images reconstructed from axial data set. Oral contrast was not administered. COMPARISON:  None FINDINGS: Lower chest: Emphysematous changes with BILATERAL lower lobe infiltrates consistent with either pneumonia or aspiration. Hepatobiliary: Liver unremarkable. Gallbladder not well visualized due to streak artifacts, grossly unremarkable. Pancreas: Normal appearance Spleen: Normal appearance Adrenals/Urinary Tract: Question adrenal thickening bilaterally. No obvious renal mass or hydronephrosis. Ureters not visualized. Foley catheter decompresses urinary bladder. Stomach/Bowel: Nasogastric tube in stomach. Low-attenuation fluid distends stomach and multiple proximal small bowel loops, with small bowel loops up to 4.1 cm  diameter. Distal small bowel loops and colon are decompressed. Scattered stool throughout colon. Findings likely represent mid small bowel obstruction. Appendix not visualized. Suspected rectal wall thickening. Vascular/Lymphatic: Atherosclerotic calcification aorta. Aorta normal caliber. Reproductive: Minimal prostatic enlargement and scattered prostatic calcifications. Other: No definite free air or free fluid. Nonspecific stranding of presacral fat. Musculoskeletal: No acute osseous findings. IMPRESSION: Dilated proximal and decompressed distal small bowel loops compatible with small bowel obstruction, likely in the mid small bowel. Due to lack of fat planes, streak artifacts, and lack of contrast, unable to localize site of obstruction. No evidence of perforation/free air. Question rectal wall thickening, recommend correlation with proctoscopy. Emphysematous changes with bibasilar pulmonary infiltrates either representing pneumonia or aspiration. Electronically Signed   By: Ulyses Southward M.D.   On: 04/30/2018 19:38   Dg Abd 1 View  Result Date: 05/20/2018 CLINICAL DATA:  NG tube adjustment EXAM: ABDOMEN - 1 VIEW COMPARISON:  05/20/2018 FINDINGS: Esophageal tube tip and side-port project over gastric fundus, somewhat abrupt appearing curvature at the distal tip with persistent gaseous dilatation. Continued marked gaseous enlargement of bowel in the left abdomen up to 5 cm. Moderate stool in the colon. Residual contrast within the bladder. IMPRESSION: 1. Esophageal tube tip and side port overlie the gastric fundus, somewhat abrupt curvature of the tip, could be due to mild kinking. 2. Persistent dilatation of stomach and left abdominal small bowel consistent with a bowel obstruction. Electronically Signed   By: Jasmine Pang M.D.   On: 05/20/2018 18:37   Dg Abd 1 View  Result Date: 05/20/2018 CLINICAL DATA:  NG tube placement. EXAM: ABDOMEN - 1 VIEW COMPARISON:  CT abdomen pelvis from same day. FINDINGS:  Tip of the NG tube in the stomach with the proximal side port at the gastroesophageal junction. Dilated loops of small bowel in the left abdomen are unchanged. Contrast is seen within the renal collecting systems and bladder. No acute osseous abnormality. IMPRESSION: 1. NG tube tip in the stomach with the proximal side port at the gastroesophageal junction. Consider advancing 2-3 cm. 2. Unchanged small bowel obstruction. Electronically Signed   By: Obie Dredge M.D.   On: 05/20/2018 16:47  Dg Abd 1 View  Result Date: 05/16/2018 CLINICAL DATA:  Feeding tube advanced today. EXAM: ABDOMEN - 1 VIEW COMPARISON:  Plain films of the abdomen dated 05/15/2018 and 05/14/2018. FINDINGS: Enteric tube is coiled in the stomach with tip at the level of the gastric fundus/cardia. Distended gas-filled loops of large and small bowel are again appreciated throughout the abdomen and upper pelvis. IMPRESSION: 1. Feeding tube is coiled in the stomach with tip at the level of the gastric fundus/cardia. 2. Persistent gaseous distention of both large and small bowel loops throughout the abdomen and pelvis, compatible with previous reports of obstruction or ileus. Electronically Signed   By: Bary Richard M.D.   On: 05/16/2018 20:26   Dg Abd 1 View  Result Date: 05/03/2018 CLINICAL DATA:  Small bowel obstruction EXAM: ABDOMEN - 1 VIEW COMPARISON:  05/02/2018 FINDINGS: NG tube is in the stomach. Gaseous distention of the stomach and left abdominal small bowel loops. Gas and stool within nondistended colon. No free air or organomegaly. IMPRESSION: Gaseous distention of stomach and left abdominal small bowel loops likely reflecting small bowel obstruction. NG tube is in the stomach. Electronically Signed   By: Charlett Nose M.D.   On: 05/03/2018 10:05   Dg Abd 1 View  Result Date: 05/02/2018 CLINICAL DATA:  Small bowel obstruction EXAM: ABDOMEN - 1 VIEW COMPARISON:  May 01, 2018 abdominal radiograph and CT abdomen and pelvis  April 30, 2018 FINDINGS: Nasogastric tube no longer present. There is slightly less bowel dilatation in the left upper quadrant compared to 1 day prior. There is moderate air in the stomach. No bowel dilatation elsewhere. No air-fluid levels. No free air. There is moderate stool in the colon. There is atelectatic change in the right lung base. IMPRESSION: Nasogastric tube no longer appreciable. Less small bowel dilatation in the left upper quadrant. Question resolving bowel obstruction. No free air evident. Electronically Signed   By: Bretta Bang III M.D.   On: 05/02/2018 14:20   Ct Head Wo Contrast  Result Date: 04/29/2018 CLINICAL DATA:  Altered level of consciousness increased over last 2 days, 2 episodes of nausea and vomiting, history of prior brain injury, dementia, former smoker, hypertension EXAM: CT HEAD WITHOUT CONTRAST TECHNIQUE: Contiguous axial images were obtained from the base of the skull through the vertex without intravenous contrast. COMPARISON:  06/08/2010 FINDINGS: Brain: Generalized atrophy. Normal ventricular morphology. No midline shift or mass effect. Small vessel chronic ischemic changes of deep cerebral white matter. 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 No intracranial hemorrhage, mass lesion, evidence of acute infarction, or extra-axial fluid collection. Vascular: Minimal atherosclerotic calcification of internal carotid arteries at skull base Skull: Intact Sinuses/Orbits: Clear Other: N/A IMPRESSION: Atrophy with small vessel chronic ischemic changes of deep cerebral white matter. 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 No acute intracranial abnormalities. Electronically Signed   By: Ulyses Southward M.D.   On: 04/29/2018 09:58   Ct Abdomen Pelvis W Contrast  Result Date: 05/20/2018 CLINICAL DATA:  Nausea and vomiting. 10 days status post laparoscopic lysis of adhesions and bowel resection. EXAM: CT ABDOMEN AND PELVIS WITH CONTRAST TECHNIQUE:  Multidetector CT imaging of the abdomen and pelvis was performed using the standard protocol following bolus administration of intravenous contrast. CONTRAST:  ISOVUE-300 IOPAMIDOL (ISOVUE-300) INJECTION 61% COMPARISON:  04/30/2018 FINDINGS: Lower Chest: Decreased airspace disease seen in both lower lobes, likely due to resolving pneumonia. Hepatobiliary: A few tiny left hepatic lobe cysts are seen. No hepatic masses  identified. Tiny calcified gallstones or layering sludge noted, however there is no evidence of cholecystitis or biliary ductal dilatation. Pancreas:  No mass or inflammatory changes. Spleen: Within normal limits in size and appearance. Adrenals/Urinary Tract: No masses identified. No evidence of hydronephrosis. Stomach/Bowel: Small amount of free air is seen, likely postoperative in etiology. No abscess identified. Multiple moderately dilated fluid-filled small bowel loops are seen in the left abdomen, with transition to nondilated small bowel in the left lower quadrant in area of surgical staples. This is consistent with a small-bowel obstruction. No mass or inflammatory process identified. Moderate amount of stool again seen in the right colon. Vascular/Lymphatic: No pathologically enlarged lymph nodes. No abdominal aortic aneurysm. Reproductive: Mild-to-moderate enlargement of prostate gland shows no significant change. Other: Subcutaneous emphysema is seen in the left anterior abdominal wall, likely postoperative in etiology. Musculoskeletal:  No suspicious bone lesions identified. IMPRESSION: Small bowel obstruction, with transition point in left lower quadrant. This may be due to adhesion, as no obstructing mass or inflammatory process identified. Small amount of postop intraperitoneal air and subcutaneous emphysema. No evidence of abscess. Cholelithiasis.  No radiographic evidence of cholecystitis. Decreased bilateral lower lobe airspace disease, consistent with resolving pneumonia or  inflammatory process. Electronically Signed   By: Myles Rosenthal M.D.   On: 05/20/2018 13:41   Dg Chest Port 1 View  Result Date: 05/15/2018 CLINICAL DATA:  Shortness of breath. Patient 5 days postop bowel resection with lysis of adhesions. EXAM: PORTABLE CHEST 1 VIEW COMPARISON:  05/11/2018 FINDINGS: Right-sided PICC line with tip over the SVC. Lungs are adequately inflated with subtle left retrocardiac opacification which may be due to vascular crowding or atelectasis and less likely infection. Remainder of the lungs are clear. Cardiomediastinal silhouette is within normal. Persistent evidence of free peritoneal air compatible with patient's recent surgery. IMPRESSION: Mild left base opacification likely atelectasis or vascular crowding and less likely infection. Persistent free peritoneal air compatible recent postop state. Right-sided PICC line with tip over the SVC. Electronically Signed   By: Elberta Fortis M.D.   On: 05/15/2018 09:38   Dg Chest Port 1 View  Result Date: 05/11/2018 CLINICAL DATA:  Shortness of breath, hypertension, pneumonia, dementia, former smoker EXAM: PORTABLE CHEST 1 VIEW COMPARISON:  Portable exam 0945 hours compared to 05/09/2018 FINDINGS: Nasogastric tube extends into stomach. RIGHT arm PICC line tip projects over SVC. Normal heart size and pulmonary vascularity. Elongation of thoracic aorta. Lungs appear emphysematous but clear. No pleural effusion or pneumothorax. Bones demineralized. Free air identified under the hemidiaphragms bilaterally; EHR indicates patient underwent a laparoscopic procedure on 05/10/2018, small-bowel resection and lysis of adhesions for small-bowel obstruction prior operative note. IMPRESSION: COPD changes without infiltrate. Free air consistent with abdominal surgery 1 day ago. Electronically Signed   By: Ulyses Southward M.D.   On: 05/11/2018 10:21   Dg Chest Port 1 View  Result Date: 05/09/2018 CLINICAL DATA:  Cough EXAM: PORTABLE CHEST 1 VIEW  COMPARISON:  05/04/2018 FINDINGS: NG tube is stable. Right upper extremity PICC placed. Tip is at the cavoatrial junction. Lungs remain hyperaerated and clear. Small right pleural effusion is stable. No pneumothorax. Aorta remains somewhat prominent due to tortuosity and rotation of the thorax. IMPRESSION: New right upper extremity PICC with its tip at the cavoatrial junction. No active cardiopulmonary disease. Electronically Signed   By: Jolaine Click M.D.   On: 05/09/2018 07:37   Dg Chest Port 1 View  Result Date: 04/29/2018 CLINICAL DATA:  Altered mental status EXAM: PORTABLE  CHEST 1 VIEW COMPARISON:  April 12, 2018 FINDINGS: There is mild scarring in the lateral right base and in the medial right lower lung region. There is no edema or consolidation. The heart size and pulmonary vascularity are normal. No adenopathy. There is a recent appearing fracture of the posterior right seventh rib with alignment essentially anatomic. An older fracture of the posterior right eighth rib is noted. No pneumothorax. IMPRESSION: Recent appearing fracture posterior right seventh rib. Older fracture posterior right eighth rib noted. Areas of mild scarring noted on the right. No edema or consolidation. Stable cardiac silhouette. Electronically Signed   By: Bretta Bang III M.D.   On: 04/29/2018 08:46   Dg Chest Port 1v Same Day  Result Date: 05/01/2018 CLINICAL DATA:  history of schizophrenia, hypertension, BPH-mostly wheelchair-bound-presented to the hospital with approximately 1 week history of intermittent vomiting, confusion for a few days prior to this hospital admission-presented with sepsis secondary to UTI, and significant constipation. C/o SOB EXAM: PORTABLE CHEST - 1 VIEW SAME DAY COMPARISON:  04/29/2018 FINDINGS: Lungs are hyperinflated. Subsegmental atelectasis or early infiltrate at the left lung base, new since previous. Right lung clear. Heart size normal.  Tortuous ectatic thoracic aorta. No  effusion. No pneumothorax. Right anterolateral fourth rib fracture, age indeterminate. Nasogastric tube extends to the stomach. IMPRESSION: 1. New patchy infiltrate or subsegmental atelectasis at the left lung base. 2. Right fourth rib fracture without pneumothorax. 3. Nasogastric tube placement to the stomach. Electronically Signed   By: Corlis Leak M.D.   On: 05/01/2018 08:24   Dg Abd 2 Views  Result Date: 05/05/2018 CLINICAL DATA:  Vomiting.  History of right lumpectomy. EXAM: ABDOMEN - 2 VIEW COMPARISON:  05/04/2018 FINDINGS: Improve dilation of small bowel loops with residual borderline gas-filled distended small bowel loops in the left upper quadrant of the abdomen. Normal colonic bowel gas pattern. No evidence of free intra-abdominal gas. Normal cardiac silhouette. Tortuosity of the thoracic aorta. Right pleural reflection versus small pleural effusion. No lobar airspace consolidation. Enteric catheter tip within the expected location the gastric body, side hole at the GE junction level. Right PICC line terminates in the expected location of the cavoatrial junction. IMPRESSION: Improved appearance of small-bowel obstruction. Enteric catheter with side hole at the level of the GE junction. Advancement may be considered. Possible small right pleural effusion. Electronically Signed   By: Ted Mcalpine M.D.   On: 05/05/2018 12:51   Dg Abd Acute W/chest  Result Date: 05/04/2018 CLINICAL DATA:  Follow-up small bowel obstruction. EXAM: DG ABDOMEN ACUTE W/ 1V CHEST COMPARISON:  Abdominal x-rays 05/04/1999 19 dating back to 04/29/2018. CT abdomen and pelvis 04/30/2018. Chest x-rays 05/01/2018, 04/12/2018. FINDINGS: Nasogastric tube tip in the fundus of the stomach. Persistent marked gaseous distension of the jejunum in the LEFT UPPER QUADRANT, slowly progressive over the past 3 days, with air-fluid levels on the LATERAL decubitus image. No evidence of free intraperitoneal air. Normal caliber colon with  moderate stool burden. Calcified uterine fibroid in the pelvic midline. Cardiac silhouette normal in size, unchanged. Thoracic aorta tortuous and atherosclerotic. Hilar and mediastinal contours otherwise unremarkable. Interval improvement in aeration in the LEFT lung base, with only minimal patchy opacities persisting. Lungs otherwise clear. Emphysematous changes in both lungs with scarring in the RIGHT mid lung, unchanged. Pleuroparenchymal scarring at the RIGHT lung base which accounts for the blunting of the costophrenic angle, unchanged dating back to the original 04/12/2018 examination. IMPRESSION: 1. Persistent partial small bowel obstruction which has slowly worsened  over the past 3 days. 2. No evidence of free intraperitoneal air. 3. Improved aeration in the LEFT LOWER LOBE, with only mild atelectasis and/or pneumonia persisting. Electronically Signed   By: Hulan Saas M.D.   On: 05/04/2018 09:28   Dg Abd Portable 1v  Result Date: 05/24/2018 CLINICAL DATA:  Follow-up small bowel obstruction. EXAM: PORTABLE ABDOMEN - 1 VIEW COMPARISON:  Portable supine abdominal radiograph of May 21, 2018 FINDINGS: The colonic stool burden is moderate. There are loops of mildly distended gas-filled small bowel to the left of midline. There is gas and stool in the rectum. There is no free extraluminal gas. The esophagogastric tube tip in proximal port project below the GE junction. The bony structures exhibit no acute abnormality. IMPRESSION: There are a few loops of mildly distended gas-filled small bowel to the left of midline. The colonic stool burden and gas pattern is within the limits of normal. There is no evidence of perforation. Electronically Signed   By: Aarin  [place] M.D.   On: 05/24/2018 09:25   Dg Abd Portable 1v-small Bowel Obstruction Protocol-initial, 8 Hr Delay  Result Date: 05/21/2018 CLINICAL DATA:  Small-bowel obstruction.  Follow-up exam. EXAM: PORTABLE ABDOMEN - 1 VIEW COMPARISON:   05/20/2018 FINDINGS: Significant improvement in small bowel dilation is noted since the prior study. The small bowel obstruction has essentially resolved radiographically. Mild generalized increased stool burden in the colon, unchanged from the previous exam. Midline abdominal wall surgical staples are stable. Nasogastric tube is well positioned, tip in the mid stomach. IMPRESSION: 1. Significant interval improvement from the previous day's exam. Small-bowel obstruction appears resolved radiographically. Electronically Signed   By: Amie Portland M.D.   On: 05/21/2018 13:12   Dg Abd Portable 1v  Result Date: 05/17/2018 CLINICAL DATA:  Small-bowel obstruction. EXAM: PORTABLE ABDOMEN - 1 VIEW COMPARISON:  05/16/2018.  05/15/2018.  CT 04/30/2018. FINDINGS: Surgical staples noted over the abdomen. NG tube noted with its tip in the stomach. Distended loops of small and large bowel are again noted. Similar findings noted on prior exam. No free air. Degenerative change in osteopenia lumbar spine and both hips. Prostate calcifications are noted. Aortoiliac atherosclerotic vascular calcification. Diffuse osteopenia degenerative changes lumbar spine and both hips. Mild blunting of the right costophrenic angle again noted suggesting scarring and/or tiny effusion. IMPRESSION: NG tube noted with its tip in the stomach. Dilated loops of small and large bowel are again noted without significant interim change. No free air identified. Electronically Signed   By: Maisie Fus  Register   On: 05/17/2018 07:32   Dg Abd Portable 1v  Result Date: 05/15/2018 CLINICAL DATA:  NG tube placement EXAM: PORTABLE ABDOMEN - 1 VIEW COMPARISON:  05/15/2018 FINDINGS: NG tube is in placed into the fundus of the stomach. Continued gaseous distention of visualized upper abdominal bowel loops. IMPRESSION: NG tube in the fundus of the stomach Electronically Signed   By: Charlett Nose M.D.   On: 05/15/2018 20:11   Dg Abd Portable 1v  Result Date:  05/15/2018 CLINICAL DATA:  Small bowel obstruction. EXAM: PORTABLE ABDOMEN - 1 VIEW COMPARISON:  One-view abdomen 05/14/2018 FINDINGS: Patient's retained left.  Heart size normal.  Lung bases are clear. Extend loops of large and small bowel are stable. There is no free air or pneumatosis. Surgical clips are noted. IMPRESSION: 1. Similar appearance of distended large and small bowel consistent with obstruction or ileus. Electronically Signed   By: Marin Roberts M.D.   On: 05/15/2018 08:10   Dg Abd  Portable 1v  Result Date: 05/14/2018 CLINICAL DATA:  Abdominal pain EXAM: PORTABLE ABDOMEN - 1 VIEW COMPARISON:  05/13/2018 FINDINGS: Interval NG tube removal. Some interval improvement in gaseous distention of the bowel. Stool and air throughout the transverse colon. Postop staples again noted. Air in the rectum. No significant interval change. Degenerative changes of the spine. Basilar chronic interstitial changes and atelectasis. IMPRESSION: NG tube removed. Stable nonspecific bowel gas pattern, suspect resolving obstruction or ileus. Little interval change. Electronically Signed   By: Judie Petit.  Shick M.D.   On: 05/14/2018 10:23   Dg Abd Portable 1v  Result Date: 05/13/2018 CLINICAL DATA:  Abdominal pain, check NG placement EXAM: PORTABLE ABDOMEN - 1 VIEW COMPARISON:  05/09/2018 FINDINGS: Nasogastric catheter is noted with the tip in the stomach. Proximal side port lies in the distal esophagus. This is stable from the prior exam. Scattered large and small bowel gas is noted. Postsurgical changes are now seen. Mild small bowel dilatation is noted which may be related to a postoperative ileus. IMPRESSION: Mild postoperative ileus. Nasogastric catheter as described. Electronically Signed   By: Alcide Clever M.D.   On: 05/13/2018 09:56   Dg Abd Portable 1v  Result Date: 05/09/2018 CLINICAL DATA:  Small-bowel obstruction EXAM: PORTABLE ABDOMEN - 1 VIEW COMPARISON:  05/08/2018; 05/06/2018; 05/05/2018 FINDINGS:  Re-demonstrated gaseous distention of several loops of small bowel with index loop of small bowel within the left mid hemiabdomen measuring 3.6 cm in diameter. Moderate colonic stool burden. No supine evidence of pneumoperitoneum. No pneumatosis or portal venous gas. Presumed calcified fibroid overlies the left hemipelvis. Stable position of support apparatus. No acute osseus abnormalities. IMPRESSION: No change to slight improvement in suspected small-bowel obstruction. Electronically Signed   By: Simonne Come M.D.   On: 05/09/2018 07:47   Dg Abd Portable 1v  Result Date: 05/08/2018 CLINICAL DATA:  Follow up small bowel obstruction EXAM: PORTABLE ABDOMEN - 1 VIEW COMPARISON:  05/06/2018 FINDINGS: Scattered large and small bowel gas is noted. Multiple dilated loops of small bowel are again identified and stable in appearance. Nasogastric catheter is again noted within the stomach. No free air is seen. Degenerative changes of the lumbar spine are noted. IMPRESSION: Persistent small bowel dilatation. No new focal abnormality is noted. Electronically Signed   By: Alcide Clever M.D.   On: 05/08/2018 08:36   Dg Abd Portable 1v  Result Date: 05/06/2018 CLINICAL DATA:  Small bowel obstruction EXAM: PORTABLE ABDOMEN - 1 VIEW COMPARISON:  Portable exam at 0628 hours compared to 05/05/2018 FINDINGS: Stool scattered throughout colon. Persistent dilatation of small bowel loops in the LEFT mid abdomen up to 4.9 cm diameter. No bowel wall thickening. Bones demineralized. No definite renal calcifications. IMPRESSION: Persistent small bowel dilatation consistent with small bowel obstruction. Little interval change. Electronically Signed   By: Ulyses Southward M.D.   On: 05/06/2018 10:21   Dg Abd Portable 1v-small Bowel Obstruction Protocol-initial, 8 Hr Delay  Result Date: 05/04/2018 CLINICAL DATA:  Small bowel obstruction protocol. 8 hour delay. EXAM: PORTABLE ABDOMEN - 1 VIEW COMPARISON:  05/03/2018 FINDINGS: Gaseous  distention of left upper quadrant small bowel. Enteric tube with tip projected over the mid abdomen consistent with location in the upper stomach. Contrast material is demonstrated in the dilated small bowel. No contrast material is identified in the colon. This is suggestive of high-grade obstruction. Degenerative changes in the spine. IMPRESSION: Contrast material is demonstrated in the dilated small bowel but not in the colon consistent with high-grade obstruction. Electronically Signed  By: Burman Nieves M.D.   On: 05/04/2018 01:55   Dg Abd Portable 1v-small Bowel Obstruction Protocol-initial, 8 Hr Delay  Result Date: 05/01/2018 CLINICAL DATA:  Small bowel obstruction. EXAM: PORTABLE ABDOMEN - 1 VIEW COMPARISON:  Radiograph earlier this day at 1109 hour, CT yesterday. FINDINGS: Enteric tube in place with tip in the stomach, side-port just beyond the gastroesophageal junction. Improving small bowel dilatation in the left abdomen. Moderate stool in the right and transverse colon. No evidence of free air. Pelvic calcification may be stone in the urinary bladder or exophytic prostate calcification. IMPRESSION: Improving small bowel dilatation in the left abdomen since radiograph earlier this day. Electronically Signed   By: Narda Rutherford M.D.   On: 05/01/2018 21:14   Dg Abd Portable 1v-small Bowel Protocol-position Verification  Result Date: 05/01/2018 CLINICAL DATA:  Nasogastric tube placement EXAM: PORTABLE ABDOMEN - 1 VIEW COMPARISON:  Portable exam 1109 hours compared to 04/30/2018 FINDINGS: Tip of nasogastric tube projects over mid stomach. Dilated small bowel loops are seen in the LEFT mid abdomen. Increased stool in RIGHT colon. Lung bases appear emphysematous with subsegmental atelectasis at LEFT base. Bones demineralized. IMPRESSION: Tip of nasogastric tube projects over mid stomach. Small bowel dilatation in LEFT mid abdomen with increased stool in RIGHT colon. Electronically Signed   By:  Ulyses Southward M.D.   On: 05/01/2018 11:22   Dg Abd Portable 1v  Result Date: 04/30/2018 CLINICAL DATA:  67 year old male with vomiting. EXAM: PORTABLE ABDOMEN - 1 VIEW COMPARISON:  04/29/2018 abdominal radiographs. FINDINGS: Portable AP semi upright and supine views at 0847 hours. Enteric tube has been placed and the stomach now appears decompressed. NG tube side hole at the gastric body. Non obstructed bowel gas pattern. Moderate volume of retained stool redemonstrated in the transverse colon and distal sigmoid/rectum. Negative visible lung bases. No pneumoperitoneum. Stable abdominal and pelvic visceral contours. Dystrophic calcification in the pelvis. Stable pelvic catheter, likely a Foley. No acute osseous abnormality identified. IMPRESSION: 1. NG tube in place with resolved gastric distention. 2. Non obstructed bowel gas pattern with moderate volume of retained stool. Electronically Signed   By: Odessa Fleming M.D.   On: 04/30/2018 09:06   Dg Abd Portable 1v  Result Date: 04/29/2018 CLINICAL DATA:  Nausea and vomiting EXAM: PORTABLE ABDOMEN - 1 VIEW COMPARISON:  None. FINDINGS: Gaseous distention of the stomach. Formed stool throughout the colon. No concerning mass effect or calcification. IMPRESSION: Prominent gaseous distension of the stomach. Constipated appearance. Electronically Signed   By: Marnee Spring M.D.   On: 04/29/2018 14:44   [place] Ekg Site Rite  Result Date: 05/04/2018 If Site Rite image not attached, placement could not be confirmed due to current cardiac rhythm.    LOS: 27 days   Signature  Susa Raring M.D on 05/26/2018 at 11:26 AM   -  To page go to [email or web address] - password [facility]

## 2018-05-26 NOTE — Progress Notes (Signed)
Physical Therapy Treatment Patient Details Name: Jeffrey Davila MRN: [id number] DOB: [birth year] Today's Date: 05/26/2018    History of Present Illness Pt is a 67 y/o male admitted secondary to AMS and Sepsis most likely secondary to UTI. CT negative for acute abnormality. Work up during hospitalizations shows, aspiration PNA, SBO, acute metabolic encephalopathy, anemia, AKI, severe malnutrition, deconditioning/debility, and hyponatremia.  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  PMH inlcudes BI, dementia, HTN, and schizophrenia.     PT Comments    Pt is generally inactive and slow to improve.  Emphasis is on balance in general and gait stability.  Pt is left in the chair for general sitting tolerance.   Follow Up Recommendations  SNF;Supervision/Assistance - 24 hour     Equipment Recommendations  Other (comment)(TBA)    Recommendations for Other Services       Precautions / Restrictions Precautions Precautions: Fall Precaution Comments: NG tube    Mobility  Bed Mobility Overal bed mobility: Needs Assistance Bed Mobility: Supine to Sit     Supine to sit: Min assist     General bed mobility comments: cues for direction and time for pt to process. Pt will then try to surge forward in attempt to accomplish task, but needs asssit  Transfers Overall transfer level: Needs assistance   Transfers: Sit to/from Stand Sit to Stand: Min assist;+2 safety/equipment            Ambulation/Gait Ambulation/Gait assistance: Mod assist Gait Distance (Feet): 45 Feet Assistive device: Rolling walker (2 wheeled) Gait Pattern/deviations: Step-through pattern Gait velocity: slower Gait velocity interpretation: <1.31 ft/sec, indicative of household ambulator General Gait Details: retropulsive, almost resistive to walking.   Stairs             Wheelchair Mobility    Modified Rankin (Stroke Patients Only)        Balance Overall balance assessment: Needs assistance Sitting-balance support: No upper extremity supported;Feet supported Sitting balance-Leahy Scale: Fair Sitting balance - Comments: guard assist for safety at EOB   Standing balance support: During functional activity Standing balance-Leahy Scale: Poor Standing balance comment: posterior lean needing external assist                            Cognition Arousal/Alertness: Awake/alert Behavior During Therapy: WFL for tasks assessed/performed Overall Cognitive Status: History of cognitive impairments - at baseline                                        Exercises Other Exercises Other Exercises: warm up LE ROM bil x7 reps.    General Comments General comments (skin integrity, edema, etc.): pt agreed to stay up for a while.  Staff will need to put on new linens.      Pertinent Vitals/Pain Pain Assessment: Faces Faces Pain Scale: Hurts a little bit Pain Location: general,  ROM of legs Pain Descriptors / Indicators: Grimacing Pain Intervention(s): Monitored during session    Home Living                      Prior Function            PT Goals (current goals can now be found in the care plan section) Acute Rehab PT Goals Patient Stated Goal: Sister "to go to rehab and then home."  PT Goal Formulation: With patient/family Time For Goal Achievement: 05/30/18 Potential to Achieve Goals: Fair Progress towards PT goals: Progressing toward goals    Frequency    Min 2X/week      PT Plan Current plan remains appropriate    Co-evaluation              AM-PAC PT "6 Clicks" Daily Activity  Outcome Measure  Difficulty turning over in bed (including adjusting bedclothes, sheets and blankets)?: Unable Difficulty moving from lying on back to sitting on the side of the bed? : Unable Difficulty sitting down on and standing up from a chair with arms (e.g., wheelchair, bedside  commode, etc,.)?: Unable Help needed moving to and from a bed to chair (including a wheelchair)?: A Little Help needed walking in hospital room?: A Lot Help needed climbing 3-5 steps with a railing? : A Lot 6 Click Score: 10    End of Session   Activity Tolerance: Patient limited by fatigue Patient left: in chair;with call bell/phone within reach;with chair alarm set Nurse Communication: Mobility status PT Visit Diagnosis: Unsteadiness on feet (R26.81);Muscle weakness (generalized) (M62.81)     Time: 8657-8469 PT Time Calculation (min) (ACUTE ONLY): 24 min  Charges:  $Gait Training: 8-22 mins $Therapeutic Activity: 8-22 mins                     05/26/2018  [place] Bing, PT Acute Rehabilitation Services [phone]  (pager) [phone]  (office)   Eliseo Gum Monserath Neff 05/26/2018, 4:12 PM

## 2018-05-26 NOTE — Progress Notes (Signed)
Patient ID: Jeffrey Davila, male   DOB: [birth year], 67 y.o.   MRN: [id number]    16 Days Post-Op  Subjective: Patient asking if his sister is coming today.  Otherwise still answers "yes" to everything.  RN states he only had a smear with SMOG yesterday.  Objective: Vital signs in last 24 hours: Temp:  [97.6 F (36.4 C)-98.1 F (36.7 C)] 98.1 F (36.7 C) (10/31 0439) Pulse Rate:  [78] 78 (10/31 0439) Resp:  [16-18] 18 (10/31 0439) BP: (94-99)/(64-71) 99/71 (10/31 0439) SpO2:  [99 %-100 %] 100 % (10/31 0439) Last BM Date: 05/25/18  Intake/Output from previous day: 10/30 0701 - 10/31 0700 In: 2352.9 [I.V.:1639.1; IV Piggyback:713.8] Out: 1220 [Urine:850; Emesis/NG output:370] Intake/Output this shift: No intake/output data recorded.  PE: Abd: soft, seems NT, ND, some BS, NGT with some bilious output, output down to 370cc yesterday  Lab Results:  Recent Labs    05/24/18 0908  WBC 13.4*  HGB 10.2*  HCT 33.0*  PLT 524*   BMET Recent Labs    05/25/18 0411 05/26/18 0445  NA 136 138  K 5.2* 5.0  CL 108 109  CO2 24 25  GLUCOSE 98 87  BUN 61* 69*  CREATININE 0.95 0.87  CALCIUM 9.5 9.3   PT/INR No results for input(s): LABPROT, INR in the last 72 hours. CMP     Component Value Date/Time   NA 138 05/26/2018 0445   K 5.0 05/26/2018 0445   CL 109 05/26/2018 0445   CO2 25 05/26/2018 0445   GLUCOSE 87 05/26/2018 0445   BUN 69 (H) 05/26/2018 0445   CREATININE 0.87 05/26/2018 0445   CALCIUM 9.3 05/26/2018 0445   PROT 7.1 05/23/2018 0347   ALBUMIN 2.7 (L) 05/23/2018 0347   AST 10 (L) 05/23/2018 0347   ALT 8 05/23/2018 0347   ALKPHOS 114 05/23/2018 0347   BILITOT 0.3 05/23/2018 0347   GFRNONAA >60 05/26/2018 0445   GFRAA >60 05/26/2018 0445   Lipase     Component Value Date/Time   LIPASE 204 (H) 04/29/2018 0904       Studies/Results: No results found.  Anti-infectives: Anti-infectives (From admission, onward)   Start     Dose/Rate Route Frequency  Ordered Stop   05/10/18 0941  ceFAZolin (ANCEF) 2-4 GM/100ML-% IVPB    Note to Pharmacy:  [name]   : cabinet override      05/10/18 0941 05/10/18 2144   05/01/18 1215  metroNIDAZOLE (FLAGYL) IVPB 500 mg  Status:  Discontinued     500 mg 100 mL/hr over 60 Minutes Intravenous Every 8 hours 05/01/18 1201 05/06/18 1153   04/30/18 1100  cefTRIAXone (ROCEPHIN) 2 g in sodium chloride 0.9 % 100 mL IVPB  Status:  Discontinued     2 g 200 mL/hr over 30 Minutes Intravenous Every 24 hours 04/29/18 1416 05/06/18 1153   04/29/18 1430  cefTRIAXone (ROCEPHIN) 1 g in sodium chloride 0.9 % 100 mL IVPB     1 g 200 mL/hr over 30 Minutes Intravenous Every 24 hours 04/29/18 1416 04/30/18 1429   04/29/18 1130  cefTRIAXone (ROCEPHIN) 1 g in sodium chloride 0.9 % 100 mL IVPB     1 g 200 mL/hr over 30 Minutes Intravenous  Once 04/29/18 1118 04/29/18 1251       Assessment/Plan Hx of prior lung surgery Acute metabolic encephalopathy Anemia- transfused 10/14 - 6.9/21 today Dr. 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 SBO vs ileus vs dysmotility.  Not a candidate for repeat operation if truly has another SBO given duration since first surgery. -minimal results with SMOG yesterday.  Will try miralax down NGT today to try and get stool moving from above and give SMOG again. -NGT output has decreased yesterday to 370cc from 800-1L.  -OOB to chair -cont TNA for nutritional support at this time, once tolerating more to eat, will start to  wean  ID -rocephin 10/4>>10/11, flagyl 10/6>>10/11 FEN -IVF,NPO/NGT, TNA  VTE -SCDs, SQ heparin Foley -condom cath   LOS: 27 days    Letha Cape , [facility] Surgery 05/26/2018, 10:53 AM Pager: [phone]

## 2018-05-26 NOTE — Progress Notes (Signed)
OT Cancellation Note  Patient Details Name: Jeffrey Davila MRN: [id number] DOB: May 17, 1951   Cancelled Treatment:    Reason Eval/Treat Not Completed: Fatigue/lethargy limiting ability to participate;Pain limiting ability to participate ( stomach- communicated with RN)  Jeanie Sewer, Dorena Bodo, OT Acute Rehabilitation Services [phone] Office- [phone]    05/26/2018, 4:37 PM

## 2018-05-27 ENCOUNTER — Inpatient Hospital Stay (HOSPITAL_COMMUNITY): Payer: Medicare Other

## 2018-05-27 LAB — BASIC METABOLIC PANEL
Anion gap: 5 (ref 5–15)
BUN: 58 mg/dL — AB (ref 8–23)
CO2: 25 mmol/L (ref 22–32)
CREATININE: 0.8 mg/dL (ref 0.61–1.24)
Calcium: 9.3 mg/dL (ref 8.9–10.3)
Chloride: 107 mmol/L (ref 98–111)
GFR calc Af Amer: >60 mL/min (ref >60)
Glucose, Bld: 99 mg/dL (ref 70–99)
Potassium: 4.5 mmol/L (ref 3.5–5.1)
SODIUM: 137 mmol/L (ref 135–145)

## 2018-05-27 LAB — PHOSPHORUS: Phosphorus: 4.1 mg/dL (ref 2.5–4.6)

## 2018-05-27 LAB — GLUCOSE, CAPILLARY
Glucose-Capillary: 96 mg/dL (ref 70–99)
Glucose-Capillary: 79 mg/dL (ref 70–99)

## 2018-05-27 LAB — MAGNESIUM: MAGNESIUM: 2.5 mg/dL — AB (ref 1.7–2.4)

## 2018-05-27 MED ORDER — TRAVASOL 10 % IV SOLN
INTRAVENOUS | Status: AC
  Administered 2018-05-27: 19:00:00 via INTRAVENOUS
  Filled 2018-05-27: qty 936

## 2018-05-27 MED ORDER — SORBITOL 70 % SOLN
960.0000 mL | TOPICAL_OIL | Freq: Once | ORAL | Status: AC
  Administered 2018-05-27: 700 mL via RECTAL
  Filled 2018-05-27: qty 473

## 2018-05-27 NOTE — Progress Notes (Addendum)
Spoke with pt's sister [name]. Reassured her that pt is resting, and is receiving medication through his IV. She expressed concerns about pt's blood pressure; reassured her that while his bp is low, it is still in normal range at this time. Pt's sister expressed that if he does not improve, she will start a lawsuit with the hospital and will move her brother. Reassured her that he is receiving the care he needs, and that we will continue to monitor him overnight.

## 2018-05-27 NOTE — Progress Notes (Signed)
Patient ID: Jeffrey Davila, male   DOB: 01/27/1951, 67 y.o.   MRN: [id number]    17 Days Post-Op  Subjective: Up in chair.  No new issues.  Smear with enema yesterday again  Objective: Vital signs in last 24 hours: Temp:  [97.6 F (36.4 C)-97.8 F (36.6 C)] 97.6 F (36.4 C) (10/31 2132) Pulse Rate:  [79] 79 (10/31 2132) Resp:  [16-18] 18 (10/31 2132) BP: (95-106)/(65-79) 106/79 (10/31 2132) SpO2:  [89 %-100 %] 99 % (10/31 2132) Last BM Date: 05/26/18  Intake/Output from previous day: 10/31 0701 - 11/01 0700 In: 1798.1 [I.V.:1381.4; IV Piggyback:416.8] Out: 2300 [Urine:2150; Emesis/NG output:150] Intake/Output this shift: Total I/O In: -  Out: 200 [Emesis/NG output:200]  PE: Abd: soft, seems nontender, ND, +BS, NGT with minimal output.  Lab Results:  No results for input(s): WBC, HGB, HCT, PLT in the last 72 hours. BMET Recent Labs    05/26/18 0445 05/27/18 0402  NA 138 137  K 5.0 4.5  CL 109 107  CO2 25 25  GLUCOSE 87 99  BUN 69* 58*  CREATININE 0.87 0.80  CALCIUM 9.3 9.3   PT/INR No results for input(s): LABPROT, INR in the last 72 hours. CMP     Component Value Date/Time   NA 137 05/27/2018 0402   K 4.5 05/27/2018 0402   CL 107 05/27/2018 0402   CO2 25 05/27/2018 0402   GLUCOSE 99 05/27/2018 0402   BUN 58 (H) 05/27/2018 0402   CREATININE 0.80 05/27/2018 0402   CALCIUM 9.3 05/27/2018 0402   PROT 7.1 05/23/2018 0347   ALBUMIN 2.7 (L) 05/23/2018 0347   AST 10 (L) 05/23/2018 0347   ALT 8 05/23/2018 0347   ALKPHOS 114 05/23/2018 0347   BILITOT 0.3 05/23/2018 0347   GFRNONAA >60 05/27/2018 0402   GFRAA >60 05/27/2018 0402   Lipase     Component Value Date/Time   LIPASE 204 (H) 04/29/2018 0904       Studies/Results: Dg Abd Portable 1v  Result Date: 05/27/2018 CLINICAL DATA:  Small bowel obstruction EXAM: PORTABLE ABDOMEN - 1 VIEW COMPARISON:  05/24/2018 FINDINGS: There is a nasogastric tube with the tip projecting over the stomach. There is  mild gaseous distension of the small bowel and colon without evidence of obstruction. There is no evidence of pneumoperitoneum, portal venous gas or pneumatosis. There are no pathologic calcifications along the expected course of the ureters. The osseous structures are unremarkable. IMPRESSION: There is a nasogastric tube with the tip projecting over the stomach. There is mild gaseous distension of the small bowel and colon without evidence of obstruction. Electronically Signed   By: Jeffrey Davila   On: 05/27/2018 09:44    Anti-infectives: Anti-infectives (From admission, onward)   Start     Dose/Rate Route Frequency Ordered Stop   05/10/18 0941  ceFAZolin (ANCEF) 2-4 GM/100ML-% IVPB    Note to Pharmacy:  Jeffrey Davila   : cabinet override      05/10/18 0941 05/10/18 2144   05/01/18 1215  metroNIDAZOLE (FLAGYL) IVPB 500 mg  Status:  Discontinued     500 mg 100 mL/hr over 60 Minutes Intravenous Every 8 hours 05/01/18 1201 05/06/18 1153   04/30/18 1100  cefTRIAXone (ROCEPHIN) 2 g in sodium chloride 0.9 % 100 mL IVPB  Status:  Discontinued     2 g 200 mL/hr over 30 Minutes Intravenous Every 24 hours 04/29/18 1416 05/06/18 1153   04/29/18 1430  cefTRIAXone (ROCEPHIN) 1 g in sodium chloride 0.9 %  100 mL IVPB     1 g 200 mL/hr over 30 Minutes Intravenous Every 24 hours 04/29/18 1416 04/30/18 1429   04/29/18 1130  cefTRIAXone (ROCEPHIN) 1 g in sodium chloride 0.9 % 100 mL IVPB     1 g 200 mL/hr over 30 Minutes Intravenous  Once 04/29/18 1118 04/29/18 1251       Assessment/Plan Hx of prior lung surgery Acute metabolic encephalopathy Anemia- transfused 10/14 - 6.9/21 today Dr. 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 SBO vs ileus vs dysmotility.  Not a candidate for repeat operation if truly has another SBO given duration since first surgery. -minimal results with SMOG yesterday.  Will try miralax down NGT today to try and get stool moving from above and give SMOG again. -NGT output has decreased significantly.  Repeat film today shows air in colon and small bowel.  Will clamp NGT today and see how he does.  -OOB to chair -cont TNA for nutritional support at this time, once tolerating more to eat, will start to wean  ID -rocephin 10/4>>10/11, flagyl 10/6>>10/11 FEN -IVF,NPO/NGT/clamped, TNA  VTE -SCDs, SQ heparin Foley -condom cath   LOS: 28 days    Jeffrey Davila , [facility] 05/27/2018, 10:01 AM Pager: [phone]
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
## 2018-05-27 NOTE — Progress Notes (Signed)
PROGRESS NOTE        PATIENT DETAILS Name: Jeffrey Davila Age: 67 y.o. Sex: male Date of Birth: [birth year] Admit Date: 04/29/2018 Admitting Physician Starleen Arms, MD PCP:Uba, Reuel Boom, MD  Brief Narrative: Patient is a 67 y.o. male with history of schizophrenia/?  Cognitive dysfunction (dementia), hypertension, BPH, remote history (in the 1990s) of trauma causing small bowel injury requiring laparotomy-mostly wheelchair-bound-presented to the hospital with approximately 1 week history of intermittent vomiting, confusion for a few days prior to this hospital admission-presented with sepsis secondary to UTI, vomiting and significant constipation.  Patient was started on IV antibiotics and admitted to the hospital service,initial imaging was suggestive of constipation causing gastric distention-NG tube was placed, however upon further evaluation with a CT scan he was found  to have a small bowel obstruction.  General surgery was consulted, even with n.p.o. status/NG tube decompression-he continues to have SBO-patient's sister has refused to sign consent for surgery and wanted patient to receive several days of TNA before she consented to surgery.  Subsequently underwent laparotomy with lysis of adhesions on 10/15, postoperative course complicated by ileus and low-grade fever.  See below for further details.  Subjective:  In bed, no chest pain, but says yes to abdominal pain, says yes to BM, unreliable historian.  Assessment/Plan:  Small bowel obstruction: Underwent laparotomy with lysis of adhesions 05/10/2018 - postoperative course complicated by ileus and likely repeat obstruction as per CT scan done on 05/20/2018 with a left lower quadrant transition point, complicated by lack of activity and baseline wheelchair/bedbound status, no surgery monitoring and following will defer management of this problem to general surgery.    Currently being managed conservatively  with bowel rest, still has significant return with NG tube on 05/24/2018 Case discussed with surgery PA on 05/26/2018 he was given smog enema on 05/25/2018 with limited success, he also received MiraLAX via his NG tube on 05/26/2018 with a small bowel movement. Continue with IV TNA and monitor, minimize narcotic use, monitor electrolytes.  Encouraged to sit up in the chair and increase activity.  Surgery is planning to clamp NG tube on 05/27/2018 and monitor. Again I will defer management of this problem to general surgery.  Low-grade fever on 10/19: No foci of infection apparent-could have been from atelectasis-not on any antimicrobial therapy.  Blood culture on 10/20 neg so far, chest x-ray on 10/20 neg for PNA.  Remains afebrile since then.  Follow.  Sepsis secondary to complicated UTI: Sepsis pathophysiology has resolved, urine culture positive for Klebsiella-has completed a course of IV Rocephin.  Blood cultures remain negative.    Aspiration pneumonia: This occurred in the early part of his hospitalization-felt to be secondary to o SBO/vomiting with underlying frailty.  Has completed a course of antimicrobial therapy.  Continue to mobilize as much as possible.    Acute metabolic encephalopathy: Improved and back to his usual baseline.  Encephalopathy was present on initial presentation-this was felt secondary to UTI, and developing SBO.Marland Kitchen  Anemia: Suspect anemia secondary to acute illness, and IV fluid dilution.  Has been transfused a total of 3 units so far, last transfusion on 10/20.  Follow CBC periodically.  No indication of any overt blood loss at this time.   AKI: Hemodynamically mediated, resolved.  Schizophrenia: Appears stable-continue IV Depakote and olanzapine.    Hypertension: Blood pressure appears stable-continue to  hold antihypertensives.   Severe malnutrition: Continue TNA-we will start low-dose nutritional supplements when diet is more stable.    Deconditioning/debility:  Suspect has significant amount of debility at baseline-he is very cachectic-deconditioning/debility has worsened due to acute illness/SBO.  PT following.  Hyperkalemia - resolved after 1 dose of Lasix will monitor.  Other issues:  Patient's sister initially wanted guarantees with surgery, wanted transfer to [facility] (refused by [facility]).  See my prior notes.  I have reviewed notes by Dr. Marena Chancy issues with patient's sister refusing NG tube placement, wanting transfer to [facility] (refused by [place] [facility] [place]).  Per nursing staff-she has on occasion been very disruptive to their workflow and in the care of the patient.  I completely agree with Dr. Marena Chancy concerns whether patient's sister is the best person to be making decisions for this patient-given numerous documentations by nursing staff and by prior MDs.  Social work, risk Insurance account manager, Camera operator and palliative care following.   Please  see my note from 05/17/2018.   DVT Prophylaxis:  Prophylactic Heparin   Code Status: Full code   Family Communication:  Sister bedside on 05/22/2018, 05/24/18 with nursing staff present  Disposition Plan:  Remain inpatient-ultimately to SNF when he is more stable.  Antimicrobial agents: Anti-infectives (From admission, onward)   Start     Dose/Rate Route Frequency Ordered Stop   05/10/18 0941  ceFAZolin (ANCEF) 2-4 GM/100ML-% IVPB    Note to Pharmacy:  [name]   : cabinet override      05/10/18 0941 05/10/18 2144   05/01/18 1215  metroNIDAZOLE (FLAGYL) IVPB 500 mg  Status:  Discontinued     500 mg 100 mL/hr over 60 Minutes Intravenous Every 8 hours 05/01/18 1201 05/06/18 1153   04/30/18 1100  cefTRIAXone (ROCEPHIN) 2 g in sodium chloride 0.9 % 100 mL IVPB  Status:  Discontinued     2 g 200 mL/hr over 30 Minutes Intravenous Every 24 hours 04/29/18 1416 05/06/18 1153   04/29/18 1430  cefTRIAXone (ROCEPHIN) 1 g in sodium chloride 0.9 % 100 mL IVPB     1  g 200 mL/hr over 30 Minutes Intravenous Every 24 hours 04/29/18 1416 04/30/18 1429   04/29/18 1130  cefTRIAXone (ROCEPHIN) 1 g in sodium chloride 0.9 % 100 mL IVPB     1 g 200 mL/hr over 30 Minutes Intravenous  Once 04/29/18 1118 04/29/18 1251      Procedures: None  CONSULTS:  None  Time spent: 25 minutes   MEDICATIONS: Scheduled Meds: . benztropine  1 mg Oral BID  . Chlorhexidine Gluconate Cloth  6 each Topical 2 times per day on Tue  . heparin  5,000 Units Subcutaneous Q8H  . lip balm  1 application Topical BID  . OLANZapine zydis  10 mg Oral BID  . polyethylene glycol  17 g Oral Daily  . polyvinyl alcohol  1 drop Both Eyes TID  . sorbitol, milk of mag, mineral oil, glycerin (SMOG) enema  960 mL Rectal Once   Continuous Infusions: . sodium chloride 10 mL/hr at 05/26/18 1320  . lactated ringers 10 mL/hr at 05/11/18 0617  . TPN ADULT (ION) 65 mL/hr at 05/27/18 0306  . TPN ADULT (ION)    . valproate sodium 250 mg (05/27/18 0446)   PRN Meds:.sodium chloride, acetaminophen, albuterol, alum & mag hydroxide-simeth, bisacodyl, diphenhydrAMINE, ketorolac, magic mouthwash, menthol-cetylpyridinium, metoprolol tartrate, ondansetron (ZOFRAN) IV, phenol, sodium chloride flush, traMADol   PHYSICAL EXAM: Vital signs: Vitals:   05/26/18 0439  05/26/18 1500 05/26/18 2132 05/26/18 2132  BP: 99/71 95/65 106/79   Pulse: 78 79 79 79  Resp: 18 16 18    Temp: 98.1 F (36.7 C) 97.8 F (36.6 C) 97.6 F (36.4 C)   TempSrc: Oral Oral Oral   SpO2: 100% 100% (!) 89% 99%  Weight:      Height:       Filed Weights   04/29/18 0842  Weight: 49.9 kg   Body mass index is 17.75 kg/m.   Exam  Awake looks to be in no distress, NG tube in place  .AT,PERRAL Supple Neck,No JVD, No cervical lymphadenopathy appriciated.  Symmetrical Chest wall movement, Good air movement bilaterally, CTAB RRR,No Gallops, Rubs or new Murmurs, No Parasternal Heave +ve B.Sounds, Abd Soft, No tenderness, No  organomegaly appriciated, No rebound - guarding or rigidity. No Cyanosis, Clubbing or edema, No new Rash or bruise  I have personally reviewed following labs and imaging studies  LABORATORY DATA: CBC: Recent Labs  Lab 05/21/18 0306 05/22/18 0400 05/23/18 0347 05/24/18 0908  WBC 12.4* 12.1*  --  13.4*  NEUTROABS  --   --  7.1  --   HGB 9.3* 9.5*  --  10.2*  HCT 30.3* 30.8*  --  33.0*  MCV 92.9 92.8  --  93.8  PLT 579* 546*  --  524*    Basic Metabolic Panel: Recent Labs  Lab 05/21/18 0306 05/22/18 0400 05/23/18 0347 05/24/18 0908 05/25/18 0411 05/26/18 0445 05/27/18 0402  NA 136 138 135 136 136 138 137  K 4.7 4.9 4.5 4.8 5.2* 5.0 4.5  CL 105 102 105 104 108 109 107  CO2 24 24 25 24 24 25 25   GLUCOSE 108* 99 93 105* 98 87 99  BUN 25* 33* 37* 41* 61* 69* 58*  CREATININE 0.69 0.78 0.72 0.73 0.95 0.87 0.80  CALCIUM 9.3 9.7 9.4 9.7 9.5 9.3 9.3  MG 2.2 2.3 2.1 2.3  --   --  2.5*  PHOS  --   --  4.3  --   --  5.3* 4.1    GFR: Estimated Creatinine Clearance: 64.1 mL/min (by C-G formula based on SCr of 0.8 mg/dL).  Liver Function Tests: Recent Labs  Lab 05/21/18 0306 05/22/18 0400 05/23/18 0347  AST 10* 10* 10*  ALT 11 10 8   ALKPHOS 129* 127* 114  BILITOT 0.2* 0.2* 0.3  PROT 6.7 6.9 7.1  ALBUMIN 2.5* 2.6* 2.7*   No results for input(s): LIPASE, AMYLASE in the last 168 hours. No results for input(s): AMMONIA in the last 168 hours.  Coagulation Profile: No results for input(s): INR, PROTIME in the last 168 hours.  Cardiac Enzymes: No results for input(s): CKTOTAL, CKMB, CKMBINDEX, TROPONINI in the last 168 hours.  BNP (last 3 results) No results for input(s): PROBNP in the last 8760 hours.  HbA1C: No results for input(s): HGBA1C in the last 72 hours.  CBG: Recent Labs  Lab 05/26/18 0030 05/26/18 0922 05/26/18 1807 05/26/18 2348 05/27/18 0831  GLUCAP 94 94 82 106* 96    Lipid Profile: No results for input(s): CHOL, HDL, LDLCALC, TRIG, CHOLHDL,  LDLDIRECT in the last 72 hours.  Thyroid Function Tests: No results for input(s): TSH, T4TOTAL, FREET4, T3FREE, THYROIDAB in the last 72 hours.  Anemia Panel: No results for input(s): VITAMINB12, FOLATE, FERRITIN, TIBC, IRON, RETICCTPCT in the last 72 hours.  Urine analysis:    Component Value Date/Time   COLORURINE YELLOW 05/15/2018 1109   APPEARANCEUR CLEAR 05/15/2018 1109  LABSPEC 1.013 05/15/2018 1109   PHURINE 7.0 05/15/2018 1109   GLUCOSEU NEGATIVE 05/15/2018 1109   HGBUR SMALL (A) 05/15/2018 1109   BILIRUBINUR NEGATIVE 05/15/2018 1109   KETONESUR NEGATIVE 05/15/2018 1109   PROTEINUR NEGATIVE 05/15/2018 1109   UROBILINOGEN 1.0 06/08/2010 1523   NITRITE NEGATIVE 05/15/2018 1109   LEUKOCYTESUR TRACE (A) 05/15/2018 1109    Sepsis Labs: Lactic Acid, Venous    Component Value Date/Time   LATICACIDVEN 1.07 04/29/2018 1334    MICROBIOLOGY: No results found for this or any previous visit (from the past 240 hour(s)).  RADIOLOGY STUDIES/RESULTS: Ct Abdomen Pelvis Wo Contrast  Result Date: 04/30/2018 CLINICAL DATA:  Nausea, bilious vomiting EXAM: CT ABDOMEN AND PELVIS WITHOUT CONTRAST TECHNIQUE: Multidetector CT imaging of the abdomen and pelvis was performed following the standard protocol without IV contrast. Sagittal and coronal MPR images reconstructed from axial data set. Oral contrast was not administered. COMPARISON:  None FINDINGS: Lower chest: Emphysematous changes with BILATERAL lower lobe infiltrates consistent with either pneumonia or aspiration. Hepatobiliary: Liver unremarkable. Gallbladder not well visualized due to streak artifacts, grossly unremarkable. Pancreas: Normal appearance Spleen: Normal appearance Adrenals/Urinary Tract: Question adrenal thickening bilaterally. No obvious renal mass or hydronephrosis. Ureters not visualized. Foley catheter decompresses urinary bladder. Stomach/Bowel: Nasogastric tube in stomach. Low-attenuation fluid distends stomach and  multiple proximal small bowel loops, with small bowel loops up to 4.1 cm diameter. Distal small bowel loops and colon are decompressed. Scattered stool throughout colon. Findings likely represent mid small bowel obstruction. Appendix not visualized. Suspected rectal wall thickening. Vascular/Lymphatic: Atherosclerotic calcification aorta. Aorta normal caliber. Reproductive: Minimal prostatic enlargement and scattered prostatic calcifications. Other: No definite free air or free fluid. Nonspecific stranding of presacral fat. Musculoskeletal: No acute osseous findings. IMPRESSION: Dilated proximal and decompressed distal small bowel loops compatible with small bowel obstruction, likely in the mid small bowel. Due to lack of fat planes, streak artifacts, and lack of contrast, unable to localize site of obstruction. No evidence of perforation/free air. Question rectal wall thickening, recommend correlation with proctoscopy. Emphysematous changes with bibasilar pulmonary infiltrates either representing pneumonia or aspiration. Electronically Signed   By: Ulyses Southward M.D.   On: 04/30/2018 19:38   Dg Abd 1 View  Result Date: 05/20/2018 CLINICAL DATA:  NG tube adjustment EXAM: ABDOMEN - 1 VIEW COMPARISON:  05/20/2018 FINDINGS: Esophageal tube tip and side-port project over gastric fundus, somewhat abrupt appearing curvature at the distal tip with persistent gaseous dilatation. Continued marked gaseous enlargement of bowel in the left abdomen up to 5 cm. Moderate stool in the colon. Residual contrast within the bladder. IMPRESSION: 1. Esophageal tube tip and side port overlie the gastric fundus, somewhat abrupt curvature of the tip, could be due to mild kinking. 2. Persistent dilatation of stomach and left abdominal small bowel consistent with a bowel obstruction. Electronically Signed   By: Jasmine Pang M.D.   On: 05/20/2018 18:37   Dg Abd 1 View  Result Date: 05/20/2018 CLINICAL DATA:  NG tube placement. EXAM:  ABDOMEN - 1 VIEW COMPARISON:  CT abdomen pelvis from same day. FINDINGS: Tip of the NG tube in the stomach with the proximal side port at the gastroesophageal junction. Dilated loops of small bowel in the left abdomen are unchanged. Contrast is seen within the renal collecting systems and bladder. No acute osseous abnormality. IMPRESSION: 1. NG tube tip in the stomach with the proximal side port at the gastroesophageal junction. Consider advancing 2-3 cm. 2. Unchanged small bowel obstruction. Electronically Signed  By: Obie Dredge M.D.   On: 05/20/2018 16:47   Dg Abd 1 View  Result Date: 05/16/2018 CLINICAL DATA:  Feeding tube advanced today. EXAM: ABDOMEN - 1 VIEW COMPARISON:  Plain films of the abdomen dated 05/15/2018 and 05/14/2018. FINDINGS: Enteric tube is coiled in the stomach with tip at the level of the gastric fundus/cardia. Distended gas-filled loops of large and small bowel are again appreciated throughout the abdomen and upper pelvis. IMPRESSION: 1. Feeding tube is coiled in the stomach with tip at the level of the gastric fundus/cardia. 2. Persistent gaseous distention of both large and small bowel loops throughout the abdomen and pelvis, compatible with previous reports of obstruction or ileus. Electronically Signed   By: Bary Richard M.D.   On: 05/16/2018 20:26   Dg Abd 1 View  Result Date: 05/03/2018 CLINICAL DATA:  Small bowel obstruction EXAM: ABDOMEN - 1 VIEW COMPARISON:  05/02/2018 FINDINGS: NG tube is in the stomach. Gaseous distention of the stomach and left abdominal small bowel loops. Gas and stool within nondistended colon. No free air or organomegaly. IMPRESSION: Gaseous distention of stomach and left abdominal small bowel loops likely reflecting small bowel obstruction. NG tube is in the stomach. Electronically Signed   By: Charlett Nose M.D.   On: 05/03/2018 10:05   Dg Abd 1 View  Result Date: 05/02/2018 CLINICAL DATA:  Small bowel obstruction EXAM: ABDOMEN - 1 VIEW  COMPARISON:  May 01, 2018 abdominal radiograph and CT abdomen and pelvis April 30, 2018 FINDINGS: Nasogastric tube no longer present. There is slightly less bowel dilatation in the left upper quadrant compared to 1 day prior. There is moderate air in the stomach. No bowel dilatation elsewhere. No air-fluid levels. No free air. There is moderate stool in the colon. There is atelectatic change in the right lung base. IMPRESSION: Nasogastric tube no longer appreciable. Less small bowel dilatation in the left upper quadrant. Question resolving bowel obstruction. No free air evident. Electronically Signed   By: Bretta Bang III M.D.   On: 05/02/2018 14:20   Ct Head Wo Contrast  Result Date: 04/29/2018 CLINICAL DATA:  Altered level of consciousness increased over last 2 days, 2 episodes of nausea and vomiting, history of prior brain injury, dementia, former smoker, hypertension EXAM: CT HEAD WITHOUT CONTRAST TECHNIQUE: Contiguous axial images were obtained from the base of the skull through the vertex without intravenous contrast. COMPARISON:  06/08/2010 FINDINGS: Brain: Generalized atrophy. Normal ventricular morphology. No midline shift or mass effect. Small vessel chronic ischemic changes of deep cerebral white matter. 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 No intracranial hemorrhage, mass lesion, evidence of acute infarction, or extra-axial fluid collection. Vascular: Minimal atherosclerotic calcification of internal carotid arteries at skull base Skull: Intact Sinuses/Orbits: Clear Other: N/A IMPRESSION: Atrophy with small vessel chronic ischemic changes of deep cerebral white matter. 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 No acute intracranial abnormalities. Electronically Signed   By: Ulyses Southward M.D.   On: 04/29/2018 09:58   Ct Abdomen Pelvis W Contrast  Result Date: 05/20/2018 CLINICAL DATA:  Nausea and vomiting. 10 days status post laparoscopic lysis of adhesions and  bowel resection. EXAM: CT ABDOMEN AND PELVIS WITH CONTRAST TECHNIQUE: Multidetector CT imaging of the abdomen and pelvis was performed using the standard protocol following bolus administration of intravenous contrast. CONTRAST:  ISOVUE-300 IOPAMIDOL (ISOVUE-300) INJECTION 61% COMPARISON:  04/30/2018 FINDINGS: Lower Chest: Decreased airspace disease seen in both lower lobes, likely due to resolving pneumonia. Hepatobiliary:  A few tiny left hepatic lobe cysts are seen. No hepatic masses identified. Tiny calcified gallstones or layering sludge noted, however there is no evidence of cholecystitis or biliary ductal dilatation. Pancreas:  No mass or inflammatory changes. Spleen: Within normal limits in size and appearance. Adrenals/Urinary Tract: No masses identified. No evidence of hydronephrosis. Stomach/Bowel: Small amount of free air is seen, likely postoperative in etiology. No abscess identified. Multiple moderately dilated fluid-filled small bowel loops are seen in the left abdomen, with transition to nondilated small bowel in the left lower quadrant in area of surgical staples. This is consistent with a small-bowel obstruction. No mass or inflammatory process identified. Moderate amount of stool again seen in the right colon. Vascular/Lymphatic: No pathologically enlarged lymph nodes. No abdominal aortic aneurysm. Reproductive: Mild-to-moderate enlargement of prostate gland shows no significant change. Other: Subcutaneous emphysema is seen in the left anterior abdominal wall, likely postoperative in etiology. Musculoskeletal:  No suspicious bone lesions identified. IMPRESSION: Small bowel obstruction, with transition point in left lower quadrant. This may be due to adhesion, as no obstructing mass or inflammatory process identified. Small amount of postop intraperitoneal air and subcutaneous emphysema. No evidence of abscess. Cholelithiasis.  No radiographic evidence of cholecystitis. Decreased bilateral  lower lobe airspace disease, consistent with resolving pneumonia or inflammatory process. Electronically Signed   By: Myles Rosenthal M.D.   On: 05/20/2018 13:41   Dg Chest Port 1 View  Result Date: 05/15/2018 CLINICAL DATA:  Shortness of breath. Patient 5 days postop bowel resection with lysis of adhesions. EXAM: PORTABLE CHEST 1 VIEW COMPARISON:  05/11/2018 FINDINGS: Right-sided PICC line with tip over the SVC. Lungs are adequately inflated with subtle left retrocardiac opacification which may be due to vascular crowding or atelectasis and less likely infection. Remainder of the lungs are clear. Cardiomediastinal silhouette is within normal. Persistent evidence of free peritoneal air compatible with patient's recent surgery. IMPRESSION: Mild left base opacification likely atelectasis or vascular crowding and less likely infection. Persistent free peritoneal air compatible recent postop state. Right-sided PICC line with tip over the SVC. Electronically Signed   By: Elberta Fortis M.D.   On: 05/15/2018 09:38   Dg Chest Port 1 View  Result Date: 05/11/2018 CLINICAL DATA:  Shortness of breath, hypertension, pneumonia, dementia, former smoker EXAM: PORTABLE CHEST 1 VIEW COMPARISON:  Portable exam 0945 hours compared to 05/09/2018 FINDINGS: Nasogastric tube extends into stomach. RIGHT arm PICC line tip projects over SVC. Normal heart size and pulmonary vascularity. Elongation of thoracic aorta. Lungs appear emphysematous but clear. No pleural effusion or pneumothorax. Bones demineralized. Free air identified under the hemidiaphragms bilaterally; EHR indicates patient underwent a laparoscopic procedure on 05/10/2018, small-bowel resection and lysis of adhesions for small-bowel obstruction prior operative note. IMPRESSION: COPD changes without infiltrate. Free air consistent with abdominal surgery 1 day ago. Electronically Signed   By: Ulyses Southward M.D.   On: 05/11/2018 10:21   Dg Chest Port 1 View  Result Date:  05/09/2018 CLINICAL DATA:  Cough EXAM: PORTABLE CHEST 1 VIEW COMPARISON:  05/04/2018 FINDINGS: NG tube is stable. Right upper extremity PICC placed. Tip is at the cavoatrial junction. Lungs remain hyperaerated and clear. Small right pleural effusion is stable. No pneumothorax. Aorta remains somewhat prominent due to tortuosity and rotation of the thorax. IMPRESSION: New right upper extremity PICC with its tip at the cavoatrial junction. No active cardiopulmonary disease. Electronically Signed   By: Jolaine Click M.D.   On: 05/09/2018 07:37   Dg Chest [facility]  Result Date: 04/29/2018 CLINICAL DATA:  Altered mental status EXAM: PORTABLE CHEST 1 VIEW COMPARISON:  April 12, 2018 FINDINGS: There is mild scarring in the lateral right base and in the medial right lower lung region. There is no edema or consolidation. The heart size and pulmonary vascularity are normal. No adenopathy. There is a recent appearing fracture of the posterior right seventh rib with alignment essentially anatomic. An older fracture of the posterior right eighth rib is noted. No pneumothorax. IMPRESSION: Recent appearing fracture posterior right seventh rib. Older fracture posterior right eighth rib noted. Areas of mild scarring noted on the right. No edema or consolidation. Stable cardiac silhouette. Electronically Signed   By: Bretta Bang III M.D.   On: 04/29/2018 08:46   Dg Chest Port 1v Same Day  Result Date: 05/01/2018 CLINICAL DATA:  history of schizophrenia, hypertension, BPH-mostly wheelchair-bound-presented to the hospital with approximately 1 week history of intermittent vomiting, confusion for a few days prior to this hospital admission-presented with sepsis secondary to UTI, and significant constipation. C/o SOB EXAM: PORTABLE CHEST - 1 VIEW SAME DAY COMPARISON:  04/29/2018 FINDINGS: Lungs are hyperinflated. Subsegmental atelectasis or early infiltrate at the left lung base, new since previous. Right lung clear.  Heart size normal.  Tortuous ectatic thoracic aorta. No effusion. No pneumothorax. Right anterolateral fourth rib fracture, age indeterminate. Nasogastric tube extends to the stomach. IMPRESSION: 1. New patchy infiltrate or subsegmental atelectasis at the left lung base. 2. Right fourth rib fracture without pneumothorax. 3. Nasogastric tube placement to the stomach. Electronically Signed   By: Corlis Leak M.D.   On: 05/01/2018 08:24   Dg Abd 2 Views  Result Date: 05/05/2018 CLINICAL DATA:  Vomiting.  History of right lumpectomy. EXAM: ABDOMEN - 2 VIEW COMPARISON:  05/04/2018 FINDINGS: Improve dilation of small bowel loops with residual borderline gas-filled distended small bowel loops in the left upper quadrant of the abdomen. Normal colonic bowel gas pattern. No evidence of free intra-abdominal gas. Normal cardiac silhouette. Tortuosity of the thoracic aorta. Right pleural reflection versus small pleural effusion. No lobar airspace consolidation. Enteric catheter tip within the expected location the gastric body, side hole at the GE junction level. Right PICC line terminates in the expected location of the cavoatrial junction. IMPRESSION: Improved appearance of small-bowel obstruction. Enteric catheter with side hole at the level of the GE junction. Advancement may be considered. Possible small right pleural effusion. Electronically Signed   By: Ted Mcalpine M.D.   On: 05/05/2018 12:51   Dg Abd Acute W/chest  Result Date: 05/04/2018 CLINICAL DATA:  Follow-up small bowel obstruction. EXAM: DG ABDOMEN ACUTE W/ 1V CHEST COMPARISON:  Abdominal x-rays 05/04/1999 19 dating back to 04/29/2018. CT abdomen and pelvis 04/30/2018. Chest x-rays 05/01/2018, 04/12/2018. FINDINGS: Nasogastric tube tip in the fundus of the stomach. Persistent marked gaseous distension of the jejunum in the LEFT UPPER QUADRANT, slowly progressive over the past 3 days, with air-fluid levels on the LATERAL decubitus image. No evidence  of free intraperitoneal air. Normal caliber colon with moderate stool burden. Calcified uterine fibroid in the pelvic midline. Cardiac silhouette normal in size, unchanged. Thoracic aorta tortuous and atherosclerotic. Hilar and mediastinal contours otherwise unremarkable. Interval improvement in aeration in the LEFT lung base, with only minimal patchy opacities persisting. Lungs otherwise clear. Emphysematous changes in both lungs with scarring in the RIGHT mid lung, unchanged. Pleuroparenchymal scarring at the RIGHT lung base which accounts for the blunting of the costophrenic angle, unchanged dating back to the original 04/12/2018 examination.  IMPRESSION: 1. Persistent partial small bowel obstruction which has slowly worsened over the past 3 days. 2. No evidence of free intraperitoneal air. 3. Improved aeration in the LEFT LOWER LOBE, with only mild atelectasis and/or pneumonia persisting. Electronically Signed   By: Hulan Saas M.D.   On: 05/04/2018 09:28   Dg Abd Portable 1v  Result Date: 05/27/2018 CLINICAL DATA:  Small bowel obstruction EXAM: PORTABLE ABDOMEN - 1 VIEW COMPARISON:  05/24/2018 FINDINGS: There is a nasogastric tube with the tip projecting over the stomach. There is mild gaseous distension of the small bowel and colon without evidence of obstruction. There is no evidence of pneumoperitoneum, portal venous gas or pneumatosis. There are no pathologic calcifications along the expected course of the ureters. The osseous structures are unremarkable. IMPRESSION: There is a nasogastric tube with the tip projecting over the stomach. There is mild gaseous distension of the small bowel and colon without evidence of obstruction. Electronically Signed   By: Elige Ko   On: 05/27/2018 09:44   Dg Abd Portable 1v  Result Date: 05/24/2018 CLINICAL DATA:  Follow-up small bowel obstruction. EXAM: PORTABLE ABDOMEN - 1 VIEW COMPARISON:  Portable supine abdominal radiograph of May 21, 2018  FINDINGS: The colonic stool burden is moderate. There are loops of mildly distended gas-filled small bowel to the left of midline. There is gas and stool in the rectum. There is no free extraluminal gas. The esophagogastric tube tip in proximal port project below the GE junction. The bony structures exhibit no acute abnormality. IMPRESSION: There are a few loops of mildly distended gas-filled small bowel to the left of midline. The colonic stool burden and gas pattern is within the limits of normal. There is no evidence of perforation. Electronically Signed   By: Elih  [place] M.D.   On: 05/24/2018 09:25   Dg Abd Portable 1v-small Bowel Obstruction Protocol-initial, 8 Hr Delay  Result Date: 05/21/2018 CLINICAL DATA:  Small-bowel obstruction.  Follow-up exam. EXAM: PORTABLE ABDOMEN - 1 VIEW COMPARISON:  05/20/2018 FINDINGS: Significant improvement in small bowel dilation is noted since the prior study. The small bowel obstruction has essentially resolved radiographically. Mild generalized increased stool burden in the colon, unchanged from the previous exam. Midline abdominal wall surgical staples are stable. Nasogastric tube is well positioned, tip in the mid stomach. IMPRESSION: 1. Significant interval improvement from the previous day's exam. Small-bowel obstruction appears resolved radiographically. Electronically Signed   By: Amie Portland M.D.   On: 05/21/2018 13:12   Dg Abd Portable 1v  Result Date: 05/17/2018 CLINICAL DATA:  Small-bowel obstruction. EXAM: PORTABLE ABDOMEN - 1 VIEW COMPARISON:  05/16/2018.  05/15/2018.  CT 04/30/2018. FINDINGS: Surgical staples noted over the abdomen. NG tube noted with its tip in the stomach. Distended loops of small and large bowel are again noted. Similar findings noted on prior exam. No free air. Degenerative change in osteopenia lumbar spine and both hips. Prostate calcifications are noted. Aortoiliac atherosclerotic vascular calcification. Diffuse osteopenia  degenerative changes lumbar spine and both hips. Mild blunting of the right costophrenic angle again noted suggesting scarring and/or tiny effusion. IMPRESSION: NG tube noted with its tip in the stomach. Dilated loops of small and large bowel are again noted without significant interim change. No free air identified. Electronically Signed   By: Maisie Fus  Register   On: 05/17/2018 07:32   Dg Abd Portable 1v  Result Date: 05/15/2018 CLINICAL DATA:  NG tube placement EXAM: PORTABLE ABDOMEN - 1 VIEW COMPARISON:  05/15/2018 FINDINGS: NG tube is  in placed into the fundus of the stomach. Continued gaseous distention of visualized upper abdominal bowel loops. IMPRESSION: NG tube in the fundus of the stomach Electronically Signed   By: Charlett Nose M.D.   On: 05/15/2018 20:11   Dg Abd Portable 1v  Result Date: 05/15/2018 CLINICAL DATA:  Small bowel obstruction. EXAM: PORTABLE ABDOMEN - 1 VIEW COMPARISON:  One-view abdomen 05/14/2018 FINDINGS: Patient's retained left.  Heart size normal.  Lung bases are clear. Extend loops of large and small bowel are stable. There is no free air or pneumatosis. Surgical clips are noted. IMPRESSION: 1. Similar appearance of distended large and small bowel consistent with obstruction or ileus. Electronically Signed   By: Marin Roberts M.D.   On: 05/15/2018 08:10   Dg Abd Portable 1v  Result Date: 05/14/2018 CLINICAL DATA:  Abdominal pain EXAM: PORTABLE ABDOMEN - 1 VIEW COMPARISON:  05/13/2018 FINDINGS: Interval NG tube removal. Some interval improvement in gaseous distention of the bowel. Stool and air throughout the transverse colon. Postop staples again noted. Air in the rectum. No significant interval change. Degenerative changes of the spine. Basilar chronic interstitial changes and atelectasis. IMPRESSION: NG tube removed. Stable nonspecific bowel gas pattern, suspect resolving obstruction or ileus. Little interval change. Electronically Signed   By: Judie Petit.  Shick M.D.    On: 05/14/2018 10:23   Dg Abd Portable 1v  Result Date: 05/13/2018 CLINICAL DATA:  Abdominal pain, check NG placement EXAM: PORTABLE ABDOMEN - 1 VIEW COMPARISON:  05/09/2018 FINDINGS: Nasogastric catheter is noted with the tip in the stomach. Proximal side port lies in the distal esophagus. This is stable from the prior exam. Scattered large and small bowel gas is noted. Postsurgical changes are now seen. Mild small bowel dilatation is noted which may be related to a postoperative ileus. IMPRESSION: Mild postoperative ileus. Nasogastric catheter as described. Electronically Signed   By: Alcide Clever M.D.   On: 05/13/2018 09:56   Dg Abd Portable 1v  Result Date: 05/09/2018 CLINICAL DATA:  Small-bowel obstruction EXAM: PORTABLE ABDOMEN - 1 VIEW COMPARISON:  05/08/2018; 05/06/2018; 05/05/2018 FINDINGS: Re-demonstrated gaseous distention of several loops of small bowel with index loop of small bowel within the left mid hemiabdomen measuring 3.6 cm in diameter. Moderate colonic stool burden. No supine evidence of pneumoperitoneum. No pneumatosis or portal venous gas. Presumed calcified fibroid overlies the left hemipelvis. Stable position of support apparatus. No acute osseus abnormalities. IMPRESSION: No change to slight improvement in suspected small-bowel obstruction. Electronically Signed   By: Simonne Come M.D.   On: 05/09/2018 07:47   Dg Abd Portable 1v  Result Date: 05/08/2018 CLINICAL DATA:  Follow up small bowel obstruction EXAM: PORTABLE ABDOMEN - 1 VIEW COMPARISON:  05/06/2018 FINDINGS: Scattered large and small bowel gas is noted. Multiple dilated loops of small bowel are again identified and stable in appearance. Nasogastric catheter is again noted within the stomach. No free air is seen. Degenerative changes of the lumbar spine are noted. IMPRESSION: Persistent small bowel dilatation. No new focal abnormality is noted. Electronically Signed   By: Alcide Clever M.D.   On: 05/08/2018 08:36   Dg  Abd Portable 1v  Result Date: 05/06/2018 CLINICAL DATA:  Small bowel obstruction EXAM: PORTABLE ABDOMEN - 1 VIEW COMPARISON:  Portable exam at 0628 hours compared to 05/05/2018 FINDINGS: Stool scattered throughout colon. Persistent dilatation of small bowel loops in the LEFT mid abdomen up to 4.9 cm diameter. No bowel wall thickening. Bones demineralized. No definite renal calcifications. IMPRESSION: Persistent small  bowel dilatation consistent with small bowel obstruction. Little interval change. Electronically Signed   By: Ulyses Southward M.D.   On: 05/06/2018 10:21   Dg Abd Portable 1v-small Bowel Obstruction Protocol-initial, 8 Hr Delay  Result Date: 05/04/2018 CLINICAL DATA:  Small bowel obstruction protocol. 8 hour delay. EXAM: PORTABLE ABDOMEN - 1 VIEW COMPARISON:  05/03/2018 FINDINGS: Gaseous distention of left upper quadrant small bowel. Enteric tube with tip projected over the mid abdomen consistent with location in the upper stomach. Contrast material is demonstrated in the dilated small bowel. No contrast material is identified in the colon. This is suggestive of high-grade obstruction. Degenerative changes in the spine. IMPRESSION: Contrast material is demonstrated in the dilated small bowel but not in the colon consistent with high-grade obstruction. Electronically Signed   By: Burman Nieves M.D.   On: 05/04/2018 01:55   Dg Abd Portable 1v-small Bowel Obstruction Protocol-initial, 8 Hr Delay  Result Date: 05/01/2018 CLINICAL DATA:  Small bowel obstruction. EXAM: PORTABLE ABDOMEN - 1 VIEW COMPARISON:  Radiograph earlier this day at 1109 hour, CT yesterday. FINDINGS: Enteric tube in place with tip in the stomach, side-port just beyond the gastroesophageal junction. Improving small bowel dilatation in the left abdomen. Moderate stool in the right and transverse colon. No evidence of free air. Pelvic calcification may be stone in the urinary bladder or exophytic prostate calcification.  IMPRESSION: Improving small bowel dilatation in the left abdomen since radiograph earlier this day. Electronically Signed   By: Narda Rutherford M.D.   On: 05/01/2018 21:14   Dg Abd Portable 1v-small Bowel Protocol-position Verification  Result Date: 05/01/2018 CLINICAL DATA:  Nasogastric tube placement EXAM: PORTABLE ABDOMEN - 1 VIEW COMPARISON:  Portable exam 1109 hours compared to 04/30/2018 FINDINGS: Tip of nasogastric tube projects over mid stomach. Dilated small bowel loops are seen in the LEFT mid abdomen. Increased stool in RIGHT colon. Lung bases appear emphysematous with subsegmental atelectasis at LEFT base. Bones demineralized. IMPRESSION: Tip of nasogastric tube projects over mid stomach. Small bowel dilatation in LEFT mid abdomen with increased stool in RIGHT colon. Electronically Signed   By: Ulyses Southward M.D.   On: 05/01/2018 11:22   Dg Abd Portable 1v  Result Date: 04/30/2018 CLINICAL DATA:  67 year old male with vomiting. EXAM: PORTABLE ABDOMEN - 1 VIEW COMPARISON:  04/29/2018 abdominal radiographs. FINDINGS: Portable AP semi upright and supine views at 0847 hours. Enteric tube has been placed and the stomach now appears decompressed. NG tube side hole at the gastric body. Non obstructed bowel gas pattern. Moderate volume of retained stool redemonstrated in the transverse colon and distal sigmoid/rectum. Negative visible lung bases. No pneumoperitoneum. Stable abdominal and pelvic visceral contours. Dystrophic calcification in the pelvis. Stable pelvic catheter, likely a Foley. No acute osseous abnormality identified. IMPRESSION: 1. NG tube in place with resolved gastric distention. 2. Non obstructed bowel gas pattern with moderate volume of retained stool. Electronically Signed   By: Odessa Fleming M.D.   On: 04/30/2018 09:06   Dg Abd Portable 1v  Result Date: 04/29/2018 CLINICAL DATA:  Nausea and vomiting EXAM: PORTABLE ABDOMEN - 1 VIEW COMPARISON:  None. FINDINGS: Gaseous distention of the  stomach. Formed stool throughout the colon. No concerning mass effect or calcification. IMPRESSION: Prominent gaseous distension of the stomach. Constipated appearance. Electronically Signed   By: Marnee Spring M.D.   On: 04/29/2018 14:44   [place] Ekg Site Rite  Result Date: 05/04/2018 If Site Rite image not attached, placement could not be confirmed due to current  cardiac rhythm.    LOS: 28 days   Signature  Susa Raring M.D on 05/27/2018 at 11:02 AM   -  To page go to [email or web address] - password [email or web address]

## 2018-05-28 ENCOUNTER — Inpatient Hospital Stay (HOSPITAL_COMMUNITY): Payer: Medicare Other

## 2018-05-28 LAB — BASIC METABOLIC PANEL
ANION GAP: 4 — AB (ref 5–15)
BUN: 49 mg/dL — ABNORMAL HIGH (ref 8–23)
CALCIUM: 9.3 mg/dL (ref 8.9–10.3)
CO2: 24 mmol/L (ref 22–32)
Chloride: 110 mmol/L (ref 98–111)
Creatinine, Ser: 0.79 mg/dL (ref 0.61–1.24)
Glucose, Bld: 94 mg/dL (ref 70–99)
Potassium: 4.6 mmol/L (ref 3.5–5.1)
Sodium: 138 mmol/L (ref 135–145)

## 2018-05-28 LAB — GLUCOSE, CAPILLARY
GLUCOSE-CAPILLARY: 96 mg/dL (ref 70–99)
Glucose-Capillary: 105 mg/dL — ABNORMAL HIGH (ref 70–99)
Glucose-Capillary: 88 mg/dL (ref 70–99)

## 2018-05-28 LAB — MAGNESIUM: Magnesium: 2 mg/dL (ref 1.7–2.4)

## 2018-05-28 MED ORDER — TRAVASOL 10 % IV SOLN
INTRAVENOUS | Status: AC
  Administered 2018-05-28: 18:00:00 via INTRAVENOUS
  Filled 2018-05-28: qty 936

## 2018-05-28 NOTE — Progress Notes (Signed)
Offered oral care again to pt and pt refused. Also offered to turn pt and pt also refused mobility. Will continue to monitor.

## 2018-05-28 NOTE — Progress Notes (Signed)
PROGRESS NOTE        PATIENT DETAILS Name: Jeffrey Davila Age: 67 y.o. Sex: male Date of Birth: 01/06/1951 Admit Date: 04/29/2018 Admitting Physician Starleen Arms, MD PCP:Uba, Reuel Boom, MD  Brief Narrative: Patient is a 67 y.o. male with history of schizophrenia/?  Cognitive dysfunction (dementia), hypertension, BPH, remote history (in the 1990s) of trauma causing small bowel injury requiring laparotomy-mostly wheelchair-bound-presented to the hospital with approximately 1 week history of intermittent vomiting, confusion for a few days prior to this hospital admission-presented with sepsis secondary to UTI, vomiting and significant constipation.  Patient was started on IV antibiotics and admitted to the hospital service,initial imaging was suggestive of constipation causing gastric distention-NG tube was placed, however upon further evaluation with a CT scan he was found  to have a small bowel obstruction.  General surgery was consulted, even with n.p.o. status/NG tube decompression-he continues to have SBO-patient's sister has refused to sign consent for surgery and wanted patient to receive several days of TNA before she consented to surgery.  Subsequently underwent laparotomy with lysis of adhesions on 10/15, postoperative course complicated by ileus and low-grade fever.  See below for further details.  Subjective:  Patient in bed, appears comfortable, denies any headache, no fever, no chest pain or pressure, no shortness of breath , no abdominal pain. No focal weakness.   Assessment/Plan:  Small bowel obstruction: Underwent laparotomy with lysis of adhesions 05/10/2018 - postoperative course complicated by ileus and likely repeat obstruction as per CT scan done on 05/20/2018 with a left lower quadrant transition point, complicated by lack of activity and baseline wheelchair/bedbound status, no surgery monitoring and following will defer management of this problem  to general surgery.    Currently being managed conservatively with bowel rest, still has significant return with NG tube on 05/24/2018 Case discussed with surgery PA on 05/26/2018 he was given smog enema on 05/25/2018 with limited success, he also received MiraLAX via his NG tube on 05/26/2018 with a small bowel movement. Continue with IV TNA and monitor, minimize narcotic use, monitor electrolytes.  Encouraged to sit up in the chair and increase activity.  Surgery has clamped NG tube on 05/27/2018, continue to monitor, repeat x-ray on 05/28/2018 appears stable, defer management of this problem to general surgery.  Low-grade fever on 10/19: No foci of infection apparent-could have been from atelectasis-not on any antimicrobial therapy.  Blood culture on 10/20 neg so far, chest x-ray on 10/20 neg for PNA.  Remains afebrile since then.  Follow.  Sepsis secondary to complicated UTI: Sepsis pathophysiology has resolved, urine culture positive for Klebsiella-has completed a course of IV Rocephin.  Blood cultures remain negative.    Aspiration pneumonia: This occurred in the early part of his hospitalization-felt to be secondary to o SBO/vomiting with underlying frailty.  Has completed a course of antimicrobial therapy.  Continue to mobilize as much as possible.    Acute metabolic encephalopathy: Improved and back to his usual baseline.  Encephalopathy was present on initial presentation-this was felt secondary to UTI, and developing SBO.Marland Kitchen  Anemia: Suspect anemia secondary to acute illness, and IV fluid dilution.  Has been transfused a total of 3 units so far, last transfusion on 10/20.  Follow CBC periodically.  No indication of any overt blood loss at this time.   AKI: Hemodynamically mediated, resolved.  Schizophrenia: Appears stable-continue IV  Depakote and olanzapine.    Hypertension: Blood pressure appears stable-continue to hold antihypertensives.   Severe malnutrition: Continue TNA-we will start  low-dose nutritional supplements when diet is more stable.    Deconditioning/debility: Suspect has significant amount of debility at baseline-he is very cachectic-deconditioning/debility has worsened due to acute illness/SBO.  PT following.  Hyperkalemia - resolved after 1 dose of Lasix will monitor.  Other issues:  Patient's sister initially wanted guarantees with surgery, wanted transfer to [facility] (refused by [facility]).  See my prior notes.  I have reviewed notes by Dr. Marena Chancy issues with patient's sister refusing NG tube placement, wanting transfer to [facility] ([facility] [place] [facility] [place]).  Per nursing staff-she has on occasion been very disruptive to their workflow and in the care of the patient.  I completely agree with Dr. Marena Chancy concerns whether patient's sister is the best person to be making decisions for this patient-given numerous documentations by nursing staff and by prior MDs.  Social work, risk Insurance account manager, Camera operator and palliative care following.   Please  see my note from 05/17/2018.   DVT Prophylaxis:  Prophylactic Heparin   Code Status: Full code   Family Communication:  Sister bedside on 05/22/2018, 05/24/18 with nursing staff present  Disposition Plan:  Remain inpatient-ultimately to SNF when he is more stable.  Antimicrobial agents: Anti-infectives (From admission, onward)   Start     Dose/Rate Route Frequency Ordered Stop   05/10/18 0941  ceFAZolin (ANCEF) 2-4 GM/100ML-% IVPB    Note to Pharmacy:  [name]   : cabinet override      05/10/18 0941 05/10/18 2144   05/01/18 1215  metroNIDAZOLE (FLAGYL) IVPB 500 mg  Status:  Discontinued     500 mg 100 mL/hr over 60 Minutes Intravenous Every 8 hours 05/01/18 1201 05/06/18 1153   04/30/18 1100  cefTRIAXone (ROCEPHIN) 2 g in sodium chloride 0.9 % 100 mL IVPB  Status:  Discontinued     2 g 200 mL/hr over 30 Minutes Intravenous Every 24 hours 04/29/18 1416 05/06/18 1153    04/29/18 1430  cefTRIAXone (ROCEPHIN) 1 g in sodium chloride 0.9 % 100 mL IVPB     1 g 200 mL/hr over 30 Minutes Intravenous Every 24 hours 04/29/18 1416 04/30/18 1429   04/29/18 1130  cefTRIAXone (ROCEPHIN) 1 g in sodium chloride 0.9 % 100 mL IVPB     1 g 200 mL/hr over 30 Minutes Intravenous  Once 04/29/18 1118 04/29/18 1251      Procedures: None  CONSULTS:  None  Time spent: 25 minutes   MEDICATIONS: Scheduled Meds: . benztropine  1 mg Oral BID  . Chlorhexidine Gluconate Cloth  6 each Topical 2 times per day on Tue  . heparin  5,000 Units Subcutaneous Q8H  . lip balm  1 application Topical BID  . OLANZapine zydis  10 mg Oral BID  . polyethylene glycol  17 g Oral Daily  . polyvinyl alcohol  1 drop Both Eyes TID   Continuous Infusions: . sodium chloride 10 mL/hr at 05/26/18 1320  . lactated ringers 10 mL/hr at 05/11/18 0617  . TPN ADULT (ION) 65 mL/hr at 05/28/18 0315  . TPN ADULT (ION)    . valproate sodium 250 mg (05/28/18 1125)   PRN Meds:.sodium chloride, acetaminophen, albuterol, alum & mag hydroxide-simeth, bisacodyl, diphenhydrAMINE, ketorolac, magic mouthwash, menthol-cetylpyridinium, metoprolol tartrate, ondansetron (ZOFRAN) IV, phenol, sodium chloride flush, traMADol   PHYSICAL EXAM: Vital signs: Vitals:   05/27/18 1514 05/27/18 2134 05/28/18 0320  05/28/18 0455  BP: 105/73 91/62 95/70  96/65  Pulse: 83 83 88 91  Resp: 16 16 17 16   Temp: 97.8 F (36.6 C) 98.3 F (36.8 C) 99.3 F (37.4 C) 98.3 F (36.8 C)  TempSrc: Oral Oral Oral Oral  SpO2: 99% 99% 97% 98%  Weight:      Height:       Filed Weights   04/29/18 0842  Weight: 49.9 kg   Body mass index is 17.75 kg/m.   Exam  Awake in no distress, NG tube in place, [place].AT,PERRAL Supple Neck,No JVD, No cervical lymphadenopathy appriciated.  Symmetrical Chest wall movement, Good air movement bilaterally, CTAB RRR,No Gallops, Rubs or new Murmurs, No Parasternal Heave +ve B.Sounds, Abd Soft, No  tenderness, No organomegaly appriciated, No rebound - guarding or rigidity. No Cyanosis, Clubbing or edema, No new Rash or bruise   I have personally reviewed following labs and imaging studies  LABORATORY DATA: CBC: Recent Labs  Lab 05/22/18 0400 05/23/18 0347 05/24/18 0908  WBC 12.1*  --  13.4*  NEUTROABS  --  7.1  --   HGB 9.5*  --  10.2*  HCT 30.8*  --  33.0*  MCV 92.8  --  93.8  PLT 546*  --  524*    Basic Metabolic Panel: Recent Labs  Lab 05/22/18 0400 05/23/18 0347 05/24/18 0908 05/25/18 0411 05/26/18 0445 05/27/18 0402 05/28/18 0443  NA 138 135 136 136 138 137 138  K 4.9 4.5 4.8 5.2* 5.0 4.5 4.6  CL 102 105 104 108 109 107 110  CO2 24 25 24 24 25 25 24   GLUCOSE 99 93 105* 98 87 99 94  BUN 33* 37* 41* 61* 69* 58* 49*  CREATININE 0.78 0.72 0.73 0.95 0.87 0.80 0.79  CALCIUM 9.7 9.4 9.7 9.5 9.3 9.3 9.3  MG 2.3 2.1 2.3  --   --  2.5* 2.0  PHOS  --  4.3  --   --  5.3* 4.1  --     GFR: Estimated Creatinine Clearance: 64.1 mL/min (by C-G formula based on SCr of 0.79 mg/dL).  Liver Function Tests: Recent Labs  Lab 05/22/18 0400 05/23/18 0347  AST 10* 10*  ALT 10 8  ALKPHOS 127* 114  BILITOT 0.2* 0.3  PROT 6.9 7.1  ALBUMIN 2.6* 2.7*   No results for input(s): LIPASE, AMYLASE in the last 168 hours. No results for input(s): AMMONIA in the last 168 hours.  Coagulation Profile: No results for input(s): INR, PROTIME in the last 168 hours.  Cardiac Enzymes: No results for input(s): CKTOTAL, CKMB, CKMBINDEX, TROPONINI in the last 168 hours.  BNP (last 3 results) No results for input(s): PROBNP in the last 8760 hours.  HbA1C: No results for input(s): HGBA1C in the last 72 hours.  CBG: Recent Labs  Lab 05/26/18 1807 05/26/18 2348 05/27/18 0831 05/27/18 1656 05/28/18 0113  GLUCAP 82 106* 96 79 96    Lipid Profile: No results for input(s): CHOL, HDL, LDLCALC, TRIG, CHOLHDL, LDLDIRECT in the last 72 hours.  Thyroid Function Tests: No results  for input(s): TSH, T4TOTAL, FREET4, T3FREE, THYROIDAB in the last 72 hours.  Anemia Panel: No results for input(s): VITAMINB12, FOLATE, FERRITIN, TIBC, IRON, RETICCTPCT in the last 72 hours.  Urine analysis:    Component Value Date/Time   COLORURINE YELLOW 05/15/2018 1109   APPEARANCEUR CLEAR 05/15/2018 1109   LABSPEC 1.013 05/15/2018 1109   PHURINE 7.0 05/15/2018 1109   GLUCOSEU NEGATIVE 05/15/2018 1109   HGBUR SMALL (A)  05/15/2018 1109   BILIRUBINUR NEGATIVE 05/15/2018 1109   KETONESUR NEGATIVE 05/15/2018 1109   PROTEINUR NEGATIVE 05/15/2018 1109   UROBILINOGEN 1.0 06/08/2010 1523   NITRITE NEGATIVE 05/15/2018 1109   LEUKOCYTESUR TRACE (A) 05/15/2018 1109    Sepsis Labs: Lactic Acid, Venous    Component Value Date/Time   LATICACIDVEN 1.07 04/29/2018 1334    MICROBIOLOGY: No results found for this or any previous visit (from the past 240 hour(s)).  RADIOLOGY STUDIES/RESULTS: Ct Abdomen Pelvis Wo Contrast  Result Date: 04/30/2018 CLINICAL DATA:  Nausea, bilious vomiting EXAM: CT ABDOMEN AND PELVIS WITHOUT CONTRAST TECHNIQUE: Multidetector CT imaging of the abdomen and pelvis was performed following the standard protocol without IV contrast. Sagittal and coronal MPR images reconstructed from axial data set. Oral contrast was not administered. COMPARISON:  None FINDINGS: Lower chest: Emphysematous changes with BILATERAL lower lobe infiltrates consistent with either pneumonia or aspiration. Hepatobiliary: Liver unremarkable. Gallbladder not well visualized due to streak artifacts, grossly unremarkable. Pancreas: Normal appearance Spleen: Normal appearance Adrenals/Urinary Tract: Question adrenal thickening bilaterally. No obvious renal mass or hydronephrosis. Ureters not visualized. Foley catheter decompresses urinary bladder. Stomach/Bowel: Nasogastric tube in stomach. Low-attenuation fluid distends stomach and multiple proximal small bowel loops, with small bowel loops up to 4.1 cm  diameter. Distal small bowel loops and colon are decompressed. Scattered stool throughout colon. Findings likely represent mid small bowel obstruction. Appendix not visualized. Suspected rectal wall thickening. Vascular/Lymphatic: Atherosclerotic calcification aorta. Aorta normal caliber. Reproductive: Minimal prostatic enlargement and scattered prostatic calcifications. Other: No definite free air or free fluid. Nonspecific stranding of presacral fat. Musculoskeletal: No acute osseous findings. IMPRESSION: Dilated proximal and decompressed distal small bowel loops compatible with small bowel obstruction, likely in the mid small bowel. Due to lack of fat planes, streak artifacts, and lack of contrast, unable to localize site of obstruction. No evidence of perforation/free air. Question rectal wall thickening, recommend correlation with proctoscopy. Emphysematous changes with bibasilar pulmonary infiltrates either representing pneumonia or aspiration. Electronically Signed   By: Ulyses Southward M.D.   On: 04/30/2018 19:38   Dg Abd 1 View  Result Date: 05/20/2018 CLINICAL DATA:  NG tube adjustment EXAM: ABDOMEN - 1 VIEW COMPARISON:  05/20/2018 FINDINGS: Esophageal tube tip and side-port project over gastric fundus, somewhat abrupt appearing curvature at the distal tip with persistent gaseous dilatation. Continued marked gaseous enlargement of bowel in the left abdomen up to 5 cm. Moderate stool in the colon. Residual contrast within the bladder. IMPRESSION: 1. Esophageal tube tip and side port overlie the gastric fundus, somewhat abrupt curvature of the tip, could be due to mild kinking. 2. Persistent dilatation of stomach and left abdominal small bowel consistent with a bowel obstruction. Electronically Signed   By: Jasmine Pang M.D.   On: 05/20/2018 18:37   Dg Abd 1 View  Result Date: 05/20/2018 CLINICAL DATA:  NG tube placement. EXAM: ABDOMEN - 1 VIEW COMPARISON:  CT abdomen pelvis from same day. FINDINGS:  Tip of the NG tube in the stomach with the proximal side port at the gastroesophageal junction. Dilated loops of small bowel in the left abdomen are unchanged. Contrast is seen within the renal collecting systems and bladder. No acute osseous abnormality. IMPRESSION: 1. NG tube tip in the stomach with the proximal side port at the gastroesophageal junction. Consider advancing 2-3 cm. 2. Unchanged small bowel obstruction. Electronically Signed   By: Obie Dredge M.D.   On: 05/20/2018 16:47   Dg Abd 1 View  Result Date: 05/16/2018 CLINICAL  DATA:  Feeding tube advanced today. EXAM: ABDOMEN - 1 VIEW COMPARISON:  Plain films of the abdomen dated 05/15/2018 and 05/14/2018. FINDINGS: Enteric tube is coiled in the stomach with tip at the level of the gastric fundus/cardia. Distended gas-filled loops of large and small bowel are again appreciated throughout the abdomen and upper pelvis. IMPRESSION: 1. Feeding tube is coiled in the stomach with tip at the level of the gastric fundus/cardia. 2. Persistent gaseous distention of both large and small bowel loops throughout the abdomen and pelvis, compatible with previous reports of obstruction or ileus. Electronically Signed   By: Bary Richard M.D.   On: 05/16/2018 20:26   Dg Abd 1 View  Result Date: 05/03/2018 CLINICAL DATA:  Small bowel obstruction EXAM: ABDOMEN - 1 VIEW COMPARISON:  05/02/2018 FINDINGS: NG tube is in the stomach. Gaseous distention of the stomach and left abdominal small bowel loops. Gas and stool within nondistended colon. No free air or organomegaly. IMPRESSION: Gaseous distention of stomach and left abdominal small bowel loops likely reflecting small bowel obstruction. NG tube is in the stomach. Electronically Signed   By: Charlett Nose M.D.   On: 05/03/2018 10:05   Dg Abd 1 View  Result Date: 05/02/2018 CLINICAL DATA:  Small bowel obstruction EXAM: ABDOMEN - 1 VIEW COMPARISON:  May 01, 2018 abdominal radiograph and CT abdomen and pelvis  April 30, 2018 FINDINGS: Nasogastric tube no longer present. There is slightly less bowel dilatation in the left upper quadrant compared to 1 day prior. There is moderate air in the stomach. No bowel dilatation elsewhere. No air-fluid levels. No free air. There is moderate stool in the colon. There is atelectatic change in the right lung base. IMPRESSION: Nasogastric tube no longer appreciable. Less small bowel dilatation in the left upper quadrant. Question resolving bowel obstruction. No free air evident. Electronically Signed   By: Bretta Bang III M.D.   On: 05/02/2018 14:20   Ct Head Wo Contrast  Result Date: 04/29/2018 CLINICAL DATA:  Altered level of consciousness increased over last 2 days, 2 episodes of nausea and vomiting, history of prior brain injury, dementia, former smoker, hypertension EXAM: CT HEAD WITHOUT CONTRAST TECHNIQUE: Contiguous axial images were obtained from the base of the skull through the vertex without intravenous contrast. COMPARISON:  06/08/2010 FINDINGS: Brain: Generalized atrophy. Normal ventricular morphology. No midline shift or mass effect. Small vessel chronic ischemic changes of deep cerebral white matter. 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 No intracranial hemorrhage, mass lesion, evidence of acute infarction, or extra-axial fluid collection. Vascular: Minimal atherosclerotic calcification of internal carotid arteries at skull base Skull: Intact Sinuses/Orbits: Clear Other: N/A IMPRESSION: Atrophy with small vessel chronic ischemic changes of deep cerebral white matter. 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 No acute intracranial abnormalities. Electronically Signed   By: Ulyses Southward M.D.   On: 04/29/2018 09:58   Ct Abdomen Pelvis W Contrast  Result Date: 05/20/2018 CLINICAL DATA:  Nausea and vomiting. 10 days status post laparoscopic lysis of adhesions and bowel resection. EXAM: CT ABDOMEN AND PELVIS WITH CONTRAST TECHNIQUE:  Multidetector CT imaging of the abdomen and pelvis was performed using the standard protocol following bolus administration of intravenous contrast. CONTRAST:  ISOVUE-300 IOPAMIDOL (ISOVUE-300) INJECTION 61% COMPARISON:  04/30/2018 FINDINGS: Lower Chest: Decreased airspace disease seen in both lower lobes, likely due to resolving pneumonia. Hepatobiliary: A few tiny left hepatic lobe cysts are seen. No hepatic masses identified. Tiny calcified gallstones or layering sludge noted, however  there is no evidence of cholecystitis or biliary ductal dilatation. Pancreas:  No mass or inflammatory changes. Spleen: Within normal limits in size and appearance. Adrenals/Urinary Tract: No masses identified. No evidence of hydronephrosis. Stomach/Bowel: Small amount of free air is seen, likely postoperative in etiology. No abscess identified. Multiple moderately dilated fluid-filled small bowel loops are seen in the left abdomen, with transition to nondilated small bowel in the left lower quadrant in area of surgical staples. This is consistent with a small-bowel obstruction. No mass or inflammatory process identified. Moderate amount of stool again seen in the right colon. Vascular/Lymphatic: No pathologically enlarged lymph nodes. No abdominal aortic aneurysm. Reproductive: Mild-to-moderate enlargement of prostate gland shows no significant change. Other: Subcutaneous emphysema is seen in the left anterior abdominal wall, likely postoperative in etiology. Musculoskeletal:  No suspicious bone lesions identified. IMPRESSION: Small bowel obstruction, with transition point in left lower quadrant. This may be due to adhesion, as no obstructing mass or inflammatory process identified. Small amount of postop intraperitoneal air and subcutaneous emphysema. No evidence of abscess. Cholelithiasis.  No radiographic evidence of cholecystitis. Decreased bilateral lower lobe airspace disease, consistent with resolving pneumonia or  inflammatory process. Electronically Signed   By: Myles Rosenthal M.D.   On: 05/20/2018 13:41   Dg Chest Port 1 View  Result Date: 05/15/2018 CLINICAL DATA:  Shortness of breath. Patient 5 days postop bowel resection with lysis of adhesions. EXAM: PORTABLE CHEST 1 VIEW COMPARISON:  05/11/2018 FINDINGS: Right-sided PICC line with tip over the SVC. Lungs are adequately inflated with subtle left retrocardiac opacification which may be due to vascular crowding or atelectasis and less likely infection. Remainder of the lungs are clear. Cardiomediastinal silhouette is within normal. Persistent evidence of free peritoneal air compatible with patient's recent surgery. IMPRESSION: Mild left base opacification likely atelectasis or vascular crowding and less likely infection. Persistent free peritoneal air compatible recent postop state. Right-sided PICC line with tip over the SVC. Electronically Signed   By: Elberta Fortis M.D.   On: 05/15/2018 09:38   Dg Chest Port 1 View  Result Date: 05/11/2018 CLINICAL DATA:  Shortness of breath, hypertension, pneumonia, dementia, former smoker EXAM: PORTABLE CHEST 1 VIEW COMPARISON:  Portable exam 0945 hours compared to 05/09/2018 FINDINGS: Nasogastric tube extends into stomach. RIGHT arm PICC line tip projects over SVC. Normal heart size and pulmonary vascularity. Elongation of thoracic aorta. Lungs appear emphysematous but clear. No pleural effusion or pneumothorax. Bones demineralized. Free air identified under the hemidiaphragms bilaterally; EHR indicates patient underwent a laparoscopic procedure on 05/10/2018, small-bowel resection and lysis of adhesions for small-bowel obstruction prior operative note. IMPRESSION: COPD changes without infiltrate. Free air consistent with abdominal surgery 1 day ago. Electronically Signed   By: Ulyses Southward M.D.   On: 05/11/2018 10:21   Dg Chest Port 1 View  Result Date: 05/09/2018 CLINICAL DATA:  Cough EXAM: PORTABLE CHEST 1 VIEW  COMPARISON:  05/04/2018 FINDINGS: NG tube is stable. Right upper extremity PICC placed. Tip is at the cavoatrial junction. Lungs remain hyperaerated and clear. Small right pleural effusion is stable. No pneumothorax. Aorta remains somewhat prominent due to tortuosity and rotation of the thorax. IMPRESSION: New right upper extremity PICC with its tip at the cavoatrial junction. No active cardiopulmonary disease. Electronically Signed   By: Jolaine Click M.D.   On: 05/09/2018 07:37   Dg Chest Port 1 View  Result Date: 04/29/2018 CLINICAL DATA:  Altered mental status EXAM: PORTABLE CHEST 1 VIEW COMPARISON:  April 12, 2018 FINDINGS:  There is mild scarring in the lateral right base and in the medial right lower lung region. There is no edema or consolidation. The heart size and pulmonary vascularity are normal. No adenopathy. There is a recent appearing fracture of the posterior right seventh rib with alignment essentially anatomic. An older fracture of the posterior right eighth rib is noted. No pneumothorax. IMPRESSION: Recent appearing fracture posterior right seventh rib. Older fracture posterior right eighth rib noted. Areas of mild scarring noted on the right. No edema or consolidation. Stable cardiac silhouette. Electronically Signed   By: Bretta Bang III M.D.   On: 04/29/2018 08:46   Dg Chest Port 1v Same Day  Result Date: 05/01/2018 CLINICAL DATA:  history of schizophrenia, hypertension, BPH-mostly wheelchair-bound-presented to the hospital with approximately 1 week history of intermittent vomiting, confusion for a few days prior to this hospital admission-presented with sepsis secondary to UTI, and significant constipation. C/o SOB EXAM: PORTABLE CHEST - 1 VIEW SAME DAY COMPARISON:  04/29/2018 FINDINGS: Lungs are hyperinflated. Subsegmental atelectasis or early infiltrate at the left lung base, new since previous. Right lung clear. Heart size normal.  Tortuous ectatic thoracic aorta. No  effusion. No pneumothorax. Right anterolateral fourth rib fracture, age indeterminate. Nasogastric tube extends to the stomach. IMPRESSION: 1. New patchy infiltrate or subsegmental atelectasis at the left lung base. 2. Right fourth rib fracture without pneumothorax. 3. Nasogastric tube placement to the stomach. Electronically Signed   By: Corlis Leak M.D.   On: 05/01/2018 08:24   Dg Abd 2 Views  Result Date: 05/05/2018 CLINICAL DATA:  Vomiting.  History of right lumpectomy. EXAM: ABDOMEN - 2 VIEW COMPARISON:  05/04/2018 FINDINGS: Improve dilation of small bowel loops with residual borderline gas-filled distended small bowel loops in the left upper quadrant of the abdomen. Normal colonic bowel gas pattern. No evidence of free intra-abdominal gas. Normal cardiac silhouette. Tortuosity of the thoracic aorta. Right pleural reflection versus small pleural effusion. No lobar airspace consolidation. Enteric catheter tip within the expected location the gastric body, side hole at the GE junction level. Right PICC line terminates in the expected location of the cavoatrial junction. IMPRESSION: Improved appearance of small-bowel obstruction. Enteric catheter with side hole at the level of the GE junction. Advancement may be considered. Possible small right pleural effusion. Electronically Signed   By: Ted Mcalpine M.D.   On: 05/05/2018 12:51   Dg Abd Acute W/chest  Result Date: 05/04/2018 CLINICAL DATA:  Follow-up small bowel obstruction. EXAM: DG ABDOMEN ACUTE W/ 1V CHEST COMPARISON:  Abdominal x-rays 05/04/1999 19 dating back to 04/29/2018. CT abdomen and pelvis 04/30/2018. Chest x-rays 05/01/2018, 04/12/2018. FINDINGS: Nasogastric tube tip in the fundus of the stomach. Persistent marked gaseous distension of the jejunum in the LEFT UPPER QUADRANT, slowly progressive over the past 3 days, with air-fluid levels on the LATERAL decubitus image. No evidence of free intraperitoneal air. Normal caliber colon with  moderate stool burden. Calcified uterine fibroid in the pelvic midline. Cardiac silhouette normal in size, unchanged. Thoracic aorta tortuous and atherosclerotic. Hilar and mediastinal contours otherwise unremarkable. Interval improvement in aeration in the LEFT lung base, with only minimal patchy opacities persisting. Lungs otherwise clear. Emphysematous changes in both lungs with scarring in the RIGHT mid lung, unchanged. Pleuroparenchymal scarring at the RIGHT lung base which accounts for the blunting of the costophrenic angle, unchanged dating back to the original 04/12/2018 examination. IMPRESSION: 1. Persistent partial small bowel obstruction which has slowly worsened over the past 3 days. 2. No evidence of  free intraperitoneal air. 3. Improved aeration in the LEFT LOWER LOBE, with only mild atelectasis and/or pneumonia persisting. Electronically Signed   By: Hulan Saas M.D.   On: 05/04/2018 09:28   Dg Abd Portable 1v  Result Date: 05/28/2018 CLINICAL DATA:  Small-bowel obstruction EXAM: PORTABLE ABDOMEN - 1 VIEW COMPARISON:  Portable exam 0746 hours compared to 05/27/2018 FINDINGS: Scattered gas and minimal stool in colon. Few air-filled loops of small bowel in the LEFT mid abdomen, decreased in prominence since previous exam. Single loop demonstrates questionable mild wall bowel wall thickening. Tip of nasogastric tube projects over mid stomach. Bones appear demineralized. IMPRESSION: Decreased small bowel distention since prior study. Electronically Signed   By: Ulyses Southward M.D.   On: 05/28/2018 08:16   Dg Abd Portable 1v  Result Date: 05/27/2018 CLINICAL DATA:  Small bowel obstruction EXAM: PORTABLE ABDOMEN - 1 VIEW COMPARISON:  05/24/2018 FINDINGS: There is a nasogastric tube with the tip projecting over the stomach. There is mild gaseous distension of the small bowel and colon without evidence of obstruction. There is no evidence of pneumoperitoneum, portal venous gas or pneumatosis. There  are no pathologic calcifications along the expected course of the ureters. The osseous structures are unremarkable. IMPRESSION: There is a nasogastric tube with the tip projecting over the stomach. There is mild gaseous distension of the small bowel and colon without evidence of obstruction. Electronically Signed   By: Elige Ko   On: 05/27/2018 09:44   Dg Abd Portable 1v  Result Date: 05/24/2018 CLINICAL DATA:  Follow-up small bowel obstruction. EXAM: PORTABLE ABDOMEN - 1 VIEW COMPARISON:  Portable supine abdominal radiograph of May 21, 2018 FINDINGS: The colonic stool burden is moderate. There are loops of mildly distended gas-filled small bowel to the left of midline. There is gas and stool in the rectum. There is no free extraluminal gas. The esophagogastric tube tip in proximal port project below the GE junction. The bony structures exhibit no acute abnormality. IMPRESSION: There are a few loops of mildly distended gas-filled small bowel to the left of midline. The colonic stool burden and gas pattern is within the limits of normal. There is no evidence of perforation. Electronically Signed   By: Kollin  [place] M.D.   On: 05/24/2018 09:25   Dg Abd Portable 1v-small Bowel Obstruction Protocol-initial, 8 Hr Delay  Result Date: 05/21/2018 CLINICAL DATA:  Small-bowel obstruction.  Follow-up exam. EXAM: PORTABLE ABDOMEN - 1 VIEW COMPARISON:  05/20/2018 FINDINGS: Significant improvement in small bowel dilation is noted since the prior study. The small bowel obstruction has essentially resolved radiographically. Mild generalized increased stool burden in the colon, unchanged from the previous exam. Midline abdominal wall surgical staples are stable. Nasogastric tube is well positioned, tip in the mid stomach. IMPRESSION: 1. Significant interval improvement from the previous day's exam. Small-bowel obstruction appears resolved radiographically. Electronically Signed   By: Amie Portland M.D.   On:  05/21/2018 13:12   Dg Abd Portable 1v  Result Date: 05/17/2018 CLINICAL DATA:  Small-bowel obstruction. EXAM: PORTABLE ABDOMEN - 1 VIEW COMPARISON:  05/16/2018.  05/15/2018.  CT 04/30/2018. FINDINGS: Surgical staples noted over the abdomen. NG tube noted with its tip in the stomach. Distended loops of small and large bowel are again noted. Similar findings noted on prior exam. No free air. Degenerative change in osteopenia lumbar spine and both hips. Prostate calcifications are noted. Aortoiliac atherosclerotic vascular calcification. Diffuse osteopenia degenerative changes lumbar spine and both hips. Mild blunting of the right costophrenic angle  again noted suggesting scarring and/or tiny effusion. IMPRESSION: NG tube noted with its tip in the stomach. Dilated loops of small and large bowel are again noted without significant interim change. No free air identified. Electronically Signed   By: Maisie Fus  Register   On: 05/17/2018 07:32   Dg Abd Portable 1v  Result Date: 05/15/2018 CLINICAL DATA:  NG tube placement EXAM: PORTABLE ABDOMEN - 1 VIEW COMPARISON:  05/15/2018 FINDINGS: NG tube is in placed into the fundus of the stomach. Continued gaseous distention of visualized upper abdominal bowel loops. IMPRESSION: NG tube in the fundus of the stomach Electronically Signed   By: Charlett Nose M.D.   On: 05/15/2018 20:11   Dg Abd Portable 1v  Result Date: 05/15/2018 CLINICAL DATA:  Small bowel obstruction. EXAM: PORTABLE ABDOMEN - 1 VIEW COMPARISON:  One-view abdomen 05/14/2018 FINDINGS: Patient's retained left.  Heart size normal.  Lung bases are clear. Extend loops of large and small bowel are stable. There is no free air or pneumatosis. Surgical clips are noted. IMPRESSION: 1. Similar appearance of distended large and small bowel consistent with obstruction or ileus. Electronically Signed   By: Marin Roberts M.D.   On: 05/15/2018 08:10   Dg Abd Portable 1v  Result Date: 05/14/2018 CLINICAL  DATA:  Abdominal pain EXAM: PORTABLE ABDOMEN - 1 VIEW COMPARISON:  05/13/2018 FINDINGS: Interval NG tube removal. Some interval improvement in gaseous distention of the bowel. Stool and air throughout the transverse colon. Postop staples again noted. Air in the rectum. No significant interval change. Degenerative changes of the spine. Basilar chronic interstitial changes and atelectasis. IMPRESSION: NG tube removed. Stable nonspecific bowel gas pattern, suspect resolving obstruction or ileus. Little interval change. Electronically Signed   By: Judie Petit.  Shick M.D.   On: 05/14/2018 10:23   Dg Abd Portable 1v  Result Date: 05/13/2018 CLINICAL DATA:  Abdominal pain, check NG placement EXAM: PORTABLE ABDOMEN - 1 VIEW COMPARISON:  05/09/2018 FINDINGS: Nasogastric catheter is noted with the tip in the stomach. Proximal side port lies in the distal esophagus. This is stable from the prior exam. Scattered large and small bowel gas is noted. Postsurgical changes are now seen. Mild small bowel dilatation is noted which may be related to a postoperative ileus. IMPRESSION: Mild postoperative ileus. Nasogastric catheter as described. Electronically Signed   By: Alcide Clever M.D.   On: 05/13/2018 09:56   Dg Abd Portable 1v  Result Date: 05/09/2018 CLINICAL DATA:  Small-bowel obstruction EXAM: PORTABLE ABDOMEN - 1 VIEW COMPARISON:  05/08/2018; 05/06/2018; 05/05/2018 FINDINGS: Re-demonstrated gaseous distention of several loops of small bowel with index loop of small bowel within the left mid hemiabdomen measuring 3.6 cm in diameter. Moderate colonic stool burden. No supine evidence of pneumoperitoneum. No pneumatosis or portal venous gas. Presumed calcified fibroid overlies the left hemipelvis. Stable position of support apparatus. No acute osseus abnormalities. IMPRESSION: No change to slight improvement in suspected small-bowel obstruction. Electronically Signed   By: Simonne Come M.D.   On: 05/09/2018 07:47   Dg Abd  Portable 1v  Result Date: 05/08/2018 CLINICAL DATA:  Follow up small bowel obstruction EXAM: PORTABLE ABDOMEN - 1 VIEW COMPARISON:  05/06/2018 FINDINGS: Scattered large and small bowel gas is noted. Multiple dilated loops of small bowel are again identified and stable in appearance. Nasogastric catheter is again noted within the stomach. No free air is seen. Degenerative changes of the lumbar spine are noted. IMPRESSION: Persistent small bowel dilatation. No new focal abnormality is noted. Electronically Signed  By: Alcide Clever M.D.   On: 05/08/2018 08:36   Dg Abd Portable 1v  Result Date: 05/06/2018 CLINICAL DATA:  Small bowel obstruction EXAM: PORTABLE ABDOMEN - 1 VIEW COMPARISON:  Portable exam at 0628 hours compared to 05/05/2018 FINDINGS: Stool scattered throughout colon. Persistent dilatation of small bowel loops in the LEFT mid abdomen up to 4.9 cm diameter. No bowel wall thickening. Bones demineralized. No definite renal calcifications. IMPRESSION: Persistent small bowel dilatation consistent with small bowel obstruction. Little interval change. Electronically Signed   By: Ulyses Southward M.D.   On: 05/06/2018 10:21   Dg Abd Portable 1v-small Bowel Obstruction Protocol-initial, 8 Hr Delay  Result Date: 05/04/2018 CLINICAL DATA:  Small bowel obstruction protocol. 8 hour delay. EXAM: PORTABLE ABDOMEN - 1 VIEW COMPARISON:  05/03/2018 FINDINGS: Gaseous distention of left upper quadrant small bowel. Enteric tube with tip projected over the mid abdomen consistent with location in the upper stomach. Contrast material is demonstrated in the dilated small bowel. No contrast material is identified in the colon. This is suggestive of high-grade obstruction. Degenerative changes in the spine. IMPRESSION: Contrast material is demonstrated in the dilated small bowel but not in the colon consistent with high-grade obstruction. Electronically Signed   By: Burman Nieves M.D.   On: 05/04/2018 01:55   Dg Abd  Portable 1v-small Bowel Obstruction Protocol-initial, 8 Hr Delay  Result Date: 05/01/2018 CLINICAL DATA:  Small bowel obstruction. EXAM: PORTABLE ABDOMEN - 1 VIEW COMPARISON:  Radiograph earlier this day at 1109 hour, CT yesterday. FINDINGS: Enteric tube in place with tip in the stomach, side-port just beyond the gastroesophageal junction. Improving small bowel dilatation in the left abdomen. Moderate stool in the right and transverse colon. No evidence of free air. Pelvic calcification may be stone in the urinary bladder or exophytic prostate calcification. IMPRESSION: Improving small bowel dilatation in the left abdomen since radiograph earlier this day. Electronically Signed   By: Narda Rutherford M.D.   On: 05/01/2018 21:14   Dg Abd Portable 1v-small Bowel Protocol-position Verification  Result Date: 05/01/2018 CLINICAL DATA:  Nasogastric tube placement EXAM: PORTABLE ABDOMEN - 1 VIEW COMPARISON:  Portable exam 1109 hours compared to 04/30/2018 FINDINGS: Tip of nasogastric tube projects over mid stomach. Dilated small bowel loops are seen in the LEFT mid abdomen. Increased stool in RIGHT colon. Lung bases appear emphysematous with subsegmental atelectasis at LEFT base. Bones demineralized. IMPRESSION: Tip of nasogastric tube projects over mid stomach. Small bowel dilatation in LEFT mid abdomen with increased stool in RIGHT colon. Electronically Signed   By: Ulyses Southward M.D.   On: 05/01/2018 11:22   Dg Abd Portable 1v  Result Date: 04/30/2018 CLINICAL DATA:  67 year old male with vomiting. EXAM: PORTABLE ABDOMEN - 1 VIEW COMPARISON:  04/29/2018 abdominal radiographs. FINDINGS: Portable AP semi upright and supine views at 0847 hours. Enteric tube has been placed and the stomach now appears decompressed. NG tube side hole at the gastric body. Non obstructed bowel gas pattern. Moderate volume of retained stool redemonstrated in the transverse colon and distal sigmoid/rectum. Negative visible lung bases. No  pneumoperitoneum. Stable abdominal and pelvic visceral contours. Dystrophic calcification in the pelvis. Stable pelvic catheter, likely a Foley. No acute osseous abnormality identified. IMPRESSION: 1. NG tube in place with resolved gastric distention. 2. Non obstructed bowel gas pattern with moderate volume of retained stool. Electronically Signed   By: Odessa Fleming M.D.   On: 04/30/2018 09:06   Dg Abd Portable 1v  Result Date: 04/29/2018 CLINICAL DATA:  Nausea and vomiting EXAM: PORTABLE ABDOMEN - 1 VIEW COMPARISON:  None. FINDINGS: Gaseous distention of the stomach. Formed stool throughout the colon. No concerning mass effect or calcification. IMPRESSION: Prominent gaseous distension of the stomach. Constipated appearance. Electronically Signed   By: Marnee Spring M.D.   On: 04/29/2018 14:44   [place] Ekg Site Rite  Result Date: 05/04/2018 If Site Rite image not attached, placement could not be confirmed due to current cardiac rhythm.    LOS: 29 days   Signature  Susa Raring M.D on 05/28/2018 at 11:34 AM   -  To page go to [email or web address] - password [facility]

## 2018-05-28 NOTE — Progress Notes (Signed)
PHARMACY - ADULT TOTAL PARENTERAL NUTRITION CONSULT NOTE   Pharmacy Consult: TPN Indication: SBO  Patient Measurements: Height: 5\' 6"  (167.6 cm) Weight: 110 lb (49.9 kg) IBW/kg (Calculated) : 63.8 TPN AdjBW (KG): 49.9 Body mass index is 17.75 kg/m.  Assessment:  8 YOM admitted on 10/4 with sepsis. Found to have PNA and a possible SBO on 10/5 CT. Pt has a history of SBO s/p colectomy in 1993 (history (in the 1990s) of trauma causing small bowel injury requiring laparotomy). An NG tube was placed with improving small bowel dilatation but patient had 3-4 episodes of vomiting overnight on 10/7-10/8 and the NG tube had to be replaced due to incorrect placement. Pharmacy now consulted to manage TPN due to worsening SBO and concern for adhesions that require surgery.  Actual body is significantly below ideal body weight.  Of note, patient is extremely cachectic. Patient hx unreliable due to mental status.  GI: s/p ex-lap with LOA, SBR and anastomosis on 10/15. NG O/P down to (bilous on 10/30). Abd xray shows moderate stool burden, SMOG daily x3 doses, LBM 10/31, refusing Miralax. NGT output reduced - clamping 11/1 - will f/u tolerance.  Endo: No hx DM - CBGs low normal, SSI d/c'd 10/14 Lytes: all WNL, CoCa 10.34 (Ca x Phos = 42, goal < 55) Renal: SCr down to 0.79, BUN down to 49 - UOP 0.3 ml/kg/hr, LR at [facility] Pulm: stable on RA Cards: VSS  Hepatobil: LFTs / tbili / TG WNL Neuro: Schizophrenia/dementia. Benztropine, Zyprexa, IV valproate. Can't really make appropriate decisions ID: s/p abx for Klebsiella UTI and aspiration PNA - afebrile, WBC WNL TPN Access: double lumen PICC placed 05/05/18 TPN start date: 05/05/18  Nutritional Goals (per RD rec on 10/28): 1550-1750kCal, 85-100 gm protein, >1.5 L fluid per day  Current Nutrition:  TPN  Plan:  Continue TPN at 65 ml/hr, providing 94g AA, 218g CHO and 47g ILE for a total of 1585 kCals per day, meeting 100% of patient  needs Electrolytes in TPN: reduce K slightly, Cl:Ac 1:1 Add MVI, trace elements, Pepcid 20mg  in TPN F/U plans for advancing diet / weaning TPN   Vincenzo Stave D. Laney [place], PharmD, BCPS, BCCCP 05/28/2018, 9:16 AM

## 2018-05-28 NOTE — Progress Notes (Signed)
Patient ID: Jeffrey Davila, male   DOB: [birth year], 67 y.o.   MRN: [id number] [facility] Surgery Progress Note:   18 Days Post-Op  Subjective: Mental status is fairly uncommunicative.  Objective: Vital signs in last 24 hours: Temp:  [97.8 F (36.6 C)-99.3 F (37.4 C)] 98.3 F (36.8 C) (11/02 0455) Pulse Rate:  [83-91] 91 (11/02 0455) Resp:  [16-17] 16 (11/02 0455) BP: (91-105)/(62-73) 96/65 (11/02 0455) SpO2:  [97 %-99 %] 98 % (11/02 0455)  Intake/Output from previous day: 11/01 0701 - 11/02 0700 In: 1930.2 [I.V.:1430.1; NG/GT:290; IV Piggyback:210.1] Out: 625 [Urine:375; Emesis/NG output:250] Intake/Output this shift: No intake/output data recorded.  Physical Exam: Work of breathing is not labored.  NG in place.  Incision bland with steristrips  Lab Results:  Results for orders placed or performed during the hospital encounter of 04/29/18 (from the past 48 hour(s))  Glucose, capillary     Status: None   Collection Time: 05/26/18  6:07 PM  Result Value Ref Range   Glucose-Capillary 82 70 - 99 mg/dL  Glucose, capillary     Status: Abnormal   Collection Time: 05/26/18 11:48 PM  Result Value Ref Range   Glucose-Capillary 106 (H) 70 - 99 mg/dL  Basic metabolic panel     Status: Abnormal   Collection Time: 05/27/18  4:02 AM  Result Value Ref Range   Sodium 137 135 - 145 mmol/L   Potassium 4.5 3.5 - 5.1 mmol/L   Chloride 107 98 - 111 mmol/L   CO2 25 22 - 32 mmol/L   Glucose, Bld 99 70 - 99 mg/dL   BUN 58 (H) 8 - 23 mg/dL   Creatinine, Ser 0.80 0.61 - 1.24 mg/dL   Calcium 9.3 8.9 - 10.3 mg/dL   GFR calc non Af Amer >60 >60 mL/min   GFR calc Af Amer >60 >60 mL/min    Comment: (NOTE) The eGFR has been calculated using the CKD EPI equation. This calculation has not been validated in all clinical situations. eGFR's persistently <60 mL/min signify possible Chronic Kidney Disease.    Anion gap 5 5 - 15    Comment: Performed at [facility] [address],  [place], [place] [address]  Magnesium     Status: Abnormal   Collection Time: 05/27/18  4:02 AM  Result Value Ref Range   Magnesium 2.5 (H) 1.7 - 2.4 mg/dL    Comment: Performed at [facility] [address], [place], [place] [address]  Phosphorus     Status: None   Collection Time: 05/27/18  4:02 AM  Result Value Ref Range   Phosphorus 4.1 2.5 - 4.6 mg/dL    Comment: Performed at [facility] [address], [place], [place] [address]  Glucose, capillary     Status: None   Collection Time: 05/27/18  8:31 AM  Result Value Ref Range   Glucose-Capillary 96 70 - 99 mg/dL  Glucose, capillary     Status: None   Collection Time: 05/27/18  4:56 PM  Result Value Ref Range   Glucose-Capillary 79 70 - 99 mg/dL  Glucose, capillary     Status: None   Collection Time: 05/28/18  1:13 AM  Result Value Ref Range   Glucose-Capillary 96 70 - 99 mg/dL  Basic metabolic panel     Status: Abnormal   Collection Time: 05/28/18  4:43 AM  Result Value Ref Range   Sodium 138 135 - 145 mmol/L   Potassium 4.6 3.5 -  5.1 mmol/L   Chloride 110 98 - 111 mmol/L   CO2 24 22 - 32 mmol/L   Glucose, Bld 94 70 - 99 mg/dL   BUN 49 (H) 8 - 23 mg/dL   Creatinine, Ser 0.79 0.61 - 1.24 mg/dL   Calcium 9.3 8.9 - 10.3 mg/dL   GFR calc non Af Amer >60 >60 mL/min   GFR calc Af Amer >60 >60 mL/min    Comment: (NOTE) The eGFR has been calculated using the CKD EPI equation. This calculation has not been validated in all clinical situations. eGFR's persistently <60 mL/min signify possible Chronic Kidney Disease.    Anion gap 4 (L) 5 - 15    Comment: Performed at [facility] [address] [place], [place] [address]  Magnesium     Status: None   Collection Time: 05/28/18  4:43 AM  Result Value Ref Range   Magnesium 2.0 1.7 - 2.4 mg/dL    Comment: Performed at [facility] [address], [place],  [address]    Radiology/Results: Dg Abd Portable 1v  Result Date: 05/28/2018 CLINICAL  DATA:  Small-bowel obstruction EXAM: PORTABLE ABDOMEN - 1 VIEW COMPARISON:  Portable exam 0746 hours compared to 05/27/2018 FINDINGS: Scattered gas and minimal stool in colon. Few air-filled loops of small bowel in the LEFT mid abdomen, decreased in prominence since previous exam. Single loop demonstrates questionable mild wall bowel wall thickening. Tip of nasogastric tube projects over mid stomach. Bones appear demineralized. IMPRESSION: Decreased small bowel distention since prior study. Electronically Signed   By: Lavonia Dana M.D.   On: 05/28/2018 08:16   Dg Abd Portable 1v  Result Date: 05/27/2018 CLINICAL DATA:  Small bowel obstruction EXAM: PORTABLE ABDOMEN - 1 VIEW COMPARISON:  05/24/2018 FINDINGS: There is a nasogastric tube with the tip projecting over the stomach. There is mild gaseous distension of the small bowel and colon without evidence of obstruction. There is no evidence of pneumoperitoneum, portal venous gas or pneumatosis. There are no pathologic calcifications along the expected course of the ureters. The osseous structures are unremarkable. IMPRESSION: There is a nasogastric tube with the tip projecting over the stomach. There is mild gaseous distension of the small bowel and colon without evidence of obstruction. Electronically Signed   By: Kathreen Devoid   On: 05/27/2018 09:44    Anti-infectives: Anti-infectives (From admission, onward)   Start     Dose/Rate Route Frequency Ordered Stop   05/10/18 0941  ceFAZolin (ANCEF) 2-4 GM/100ML-% IVPB    Note to Pharmacy:  Lisette Grinder   : cabinet override      05/10/18 0941 05/10/18 2144   05/01/18 1215  metroNIDAZOLE (FLAGYL) IVPB 500 mg  Status:  Discontinued     500 mg 100 mL/hr over 60 Minutes Intravenous Every 8 hours 05/01/18 1201 05/06/18 1153   04/30/18 1100  cefTRIAXone (ROCEPHIN) 2 g in sodium chloride 0.9 % 100 mL IVPB  Status:  Discontinued     2 g 200 mL/hr over 30 Minutes Intravenous Every 24 hours 04/29/18 1416  05/06/18 1153   04/29/18 1430  cefTRIAXone (ROCEPHIN) 1 g in sodium chloride 0.9 % 100 mL IVPB     1 g 200 mL/hr over 30 Minutes Intravenous Every 24 hours 04/29/18 1416 04/30/18 1429   04/29/18 1130  cefTRIAXone (ROCEPHIN) 1 g in sodium chloride 0.9 % 100 mL IVPB     1 g 200 mL/hr over 30 Minutes Intravenous  Once 04/29/18 1118 04/29/18 1251  Assessment/Plan: Problem List: Patient Active Problem List   Diagnosis Date Noted  . SBO (small bowel obstruction) s/p adhesiolysis & SB resection 05/10/2018 05/12/2018  . Bipolar disorder ([place])   . Schizophrenia ([place])   . Hypertension   . Anxiety   . Anemia   . Protein-calorie malnutrition, severe 05/02/2018  . Sepsis ([facility]) 04/29/2018    Not much in NG tube;  Slow progress.   18 Days Post-Op    LOS: 29 days   Matt B. Hassell Done, MD, [facility] [phone] beeper [phone]  05/28/2018 11:00 AM

## 2018-05-29 ENCOUNTER — Inpatient Hospital Stay (HOSPITAL_COMMUNITY): Payer: Medicare Other

## 2018-05-29 LAB — CBC
HCT: 30.8 % — ABNORMAL LOW (ref 39.0–52.0)
Hemoglobin: 8.8 g/dL — ABNORMAL LOW (ref 13.0–17.0)
MCH: 27.4 pg (ref 26.0–34.0)
MCHC: 28.6 g/dL — ABNORMAL LOW (ref 30.0–36.0)
MCV: 96 fL (ref 80.0–100.0)
NRBC: 0 % (ref 0.0–0.2)
PLATELETS: 295 10*3/uL (ref 150–400)
RBC: 3.21 MIL/uL — ABNORMAL LOW (ref 4.22–5.81)
RDW: 17.1 % — ABNORMAL HIGH (ref 11.5–15.5)
WBC: 8.5 10*3/uL (ref 4.0–10.5)

## 2018-05-29 LAB — BASIC METABOLIC PANEL
Anion gap: 5 (ref 5–15)
BUN: 41 mg/dL — ABNORMAL HIGH (ref 8–23)
CO2: 22 mmol/L (ref 22–32)
Calcium: 9.1 mg/dL (ref 8.9–10.3)
Chloride: 110 mmol/L (ref 98–111)
Creatinine, Ser: 0.74 mg/dL (ref 0.61–1.24)
GFR calc Af Amer: >60 mL/min (ref >60)
GLUCOSE: 97 mg/dL (ref 70–99)
Potassium: 4.3 mmol/L (ref 3.5–5.1)
SODIUM: 137 mmol/L (ref 135–145)

## 2018-05-29 LAB — MAGNESIUM: Magnesium: 1.7 mg/dL (ref 1.7–2.4)

## 2018-05-29 LAB — GLUCOSE, CAPILLARY
GLUCOSE-CAPILLARY: 100 mg/dL — AB (ref 70–99)
GLUCOSE-CAPILLARY: 100 mg/dL — AB (ref 70–99)
GLUCOSE-CAPILLARY: 73 mg/dL (ref 70–99)

## 2018-05-29 LAB — BRAIN NATRIURETIC PEPTIDE: B NATRIURETIC PEPTIDE 5: 15 pg/mL (ref 0.0–100.0)

## 2018-05-29 MED ORDER — FUROSEMIDE 10 MG/ML IJ SOLN
40.0000 mg | Freq: Once | INTRAMUSCULAR | Status: AC
  Administered 2018-05-29: 40 mg via INTRAVENOUS
  Filled 2018-05-29: qty 4

## 2018-05-29 MED ORDER — DIATRIZOATE MEGLUMINE & SODIUM 66-10 % PO SOLN
90.0000 mL | Freq: Once | ORAL | Status: AC
  Administered 2018-05-29: 90 mL via NASOGASTRIC
  Filled 2018-05-29: qty 90

## 2018-05-29 MED ORDER — TRAVASOL 10 % IV SOLN
INTRAVENOUS | Status: AC
  Administered 2018-05-29: 17:00:00 via INTRAVENOUS
  Filled 2018-05-29: qty 936

## 2018-05-29 MED ORDER — BISACODYL 10 MG RE SUPP: 10.0000 mg | Freq: Every day | RECTAL | Status: DC

## 2018-05-29 MED ORDER — BISACODYL 10 MG RE SUPP
10.0000 mg | Freq: Every day | RECTAL | Status: DC
  Administered 2018-05-29 – 2018-05-30 (×2): 10 mg via RECTAL
  Filled 2018-05-29 (×4): qty 1

## 2018-05-29 NOTE — Progress Notes (Signed)
Patient ID: Jeffrey Davila, male   DOB: [birth year], 67 y.o.   MRN: [id number] [facility] Progress Note:   19 Days Post-Op  Subjective: Mental status is subdued.  He did respond to me today Objective: Vital signs in last 24 hours: Temp:  [97.9 F (36.6 C)-99.8 F (37.7 C)] 97.9 F (36.6 C) (11/03 0629) Pulse Rate:  [85-89] 87 (11/03 0629) Resp:  [18] 18 (11/02 1539) BP: (92-100)/(62-74) 98/74 (11/03 0629) SpO2:  [98 %-100 %] 100 % (11/03 0629)  Intake/Output from previous day: 11/02 0701 - 11/03 0700 In: -  Out: 550 [Urine:550] Intake/Output this shift: No intake/output data recorded.  Physical Exam: Work of breathing is normal.  Abdomen is flat and nontender  Lab Results:  Results for orders placed or performed during the hospital encounter of 04/29/18 (from the past 48 hour(s))  Glucose, capillary     Status: None   Collection Time: 05/27/18  4:56 PM  Result Value Ref Range   Glucose-Capillary 79 70 - 99 mg/dL  Glucose, capillary     Status: None   Collection Time: 05/28/18  1:13 AM  Result Value Ref Range   Glucose-Capillary 96 70 - 99 mg/dL  Basic metabolic panel     Status: Abnormal   Collection Time: 05/28/18  4:43 AM  Result Value Ref Range   Sodium 138 135 - 145 mmol/L   Potassium 4.6 3.5 - 5.1 mmol/L   Chloride 110 98 - 111 mmol/L   CO2 24 22 - 32 mmol/L   Glucose, Bld 94 70 - 99 mg/dL   BUN 49 (H) 8 - 23 mg/dL   Creatinine, Ser 0.79 0.61 - 1.24 mg/dL   Calcium 9.3 8.9 - 10.3 mg/dL   GFR calc non Af Amer >60 >60 mL/min   GFR calc Af Amer >60 >60 mL/min    Comment: (NOTE) The eGFR has been calculated using the CKD EPI equation. This calculation has not been validated in all clinical situations. eGFR's persistently <60 mL/min signify possible Chronic Kidney Disease.    Anion gap 4 (L) 5 - 15    Comment: Performed at [facility] [address], [place], [place] [address]  Magnesium     Status: None   Collection Time: 05/28/18  4:43 AM   Result Value Ref Range   Magnesium 2.0 1.7 - 2.4 mg/dL    Comment: Performed at [facility] [address] [place], [place] [address]  Glucose, capillary     Status: Abnormal   Collection Time: 05/28/18 12:32 PM  Result Value Ref Range   Glucose-Capillary 105 (H) 70 - 99 mg/dL  Glucose, capillary     Status: None   Collection Time: 05/28/18  4:06 PM  Result Value Ref Range   Glucose-Capillary 88 70 - 99 mg/dL  Glucose, capillary     Status: Abnormal   Collection Time: 05/29/18 12:28 AM  Result Value Ref Range   Glucose-Capillary 100 (H) 70 - 99 mg/dL  CBC     Status: Abnormal   Collection Time: 05/29/18  3:31 AM  Result Value Ref Range   WBC 8.5 4.0 - 10.5 K/uL   RBC 3.21 (L) 4.22 - 5.81 MIL/uL   Hemoglobin 8.8 (L) 13.0 - 17.0 g/dL   HCT 30.8 (L) 39.0 - 52.0 %   MCV 96.0 80.0 - 100.0 fL   MCH 27.4 26.0 - 34.0 pg   MCHC 28.6 (L) 30.0 - 36.0 g/dL   RDW 17.1 (H) 11.5 -  15.5 %   Platelets 295 150 - 400 K/uL   nRBC 0.0 0.0 - 0.2 %    Comment: Performed at [facility], [place] [address], [place], [place] [address]  Basic metabolic panel     Status: Abnormal   Collection Time: 05/29/18  3:31 AM  Result Value Ref Range   Sodium 137 135 - 145 mmol/L   Potassium 4.3 3.5 - 5.1 mmol/L   Chloride 110 98 - 111 mmol/L   CO2 22 22 - 32 mmol/L   Glucose, Bld 97 70 - 99 mg/dL   BUN 41 (H) 8 - 23 mg/dL   Creatinine, Ser 0.74 0.61 - 1.24 mg/dL   Calcium 9.1 8.9 - 10.3 mg/dL   GFR calc non Af Amer >60 >60 mL/min   GFR calc Af Amer >60 >60 mL/min    Comment: (NOTE) The eGFR has been calculated using the CKD EPI equation. This calculation has not been validated in all clinical situations. eGFR's persistently <60 mL/min signify possible Chronic Kidney Disease.    Anion gap 5 5 - 15    Comment: Performed at [facility] [address] [place], [place] [address]  Magnesium     Status: None   Collection Time: 05/29/18  3:31 AM  Result Value Ref Range   Magnesium 1.7  1.7 - 2.4 mg/dL    Comment: Performed at [facility] [address], [place], [place] [address]  Glucose, capillary     Status: None   Collection Time: 05/29/18  8:25 AM  Result Value Ref Range   Glucose-Capillary 73 70 - 99 mg/dL  Brain natriuretic peptide     Status: None   Collection Time: 05/29/18 10:13 AM  Result Value Ref Range   B Natriuretic Peptide 15.0 0.0 - 100.0 pg/mL    Comment: Performed at [facility], [place] [address], [place], [place] [address]    Radiology/Results: Dg Chest Port 1 View  Result Date: 05/29/2018 CLINICAL DATA:  Order for SBO EXAM: PORTABLE CHEST 1 VIEW COMPARISON:  Radiograph 05/15/2018 FINDINGS: NG tube extends into the stomach. PICC line unchanged. Normal cardiac silhouette with ectatic aorta. Lungs are hyperinflated. Remote RIGHT rib fractures. IMPRESSION: No acute cardiopulmonary process. Electronically Signed   By: Suzy Bouchard M.D.   On: 05/29/2018 10:41   Dg Abd Portable 1v  Result Date: 05/29/2018 CLINICAL DATA:  Small bowel obstruction. EXAM: PORTABLE ABDOMEN - 1 VIEW COMPARISON:  One-view abdomen 05/28/2018 and 05/27/2018. FINDINGS: Bowel gas pattern is normal. No significant dilated loops of bowel remain. There is no free air. The side port of the NG tube is above the GE junction and could be advanced for more optimal positioning. The lung bases are clear. IMPRESSION: 1. Normal bowel gas pattern. No residual dilated loops of small bowel. 2. The side port of the NG tube is above the GE junction. Electronically Signed   By: San Morelle M.D.   On: 05/29/2018 10:42   Dg Abd Portable 1v  Result Date: 05/28/2018 CLINICAL DATA:  Small-bowel obstruction EXAM: PORTABLE ABDOMEN - 1 VIEW COMPARISON:  Portable exam 0746 hours compared to 05/27/2018 FINDINGS: Scattered gas and minimal stool in colon. Few air-filled loops of small bowel in the LEFT mid abdomen, decreased in prominence since previous exam. Single loop demonstrates  questionable mild wall bowel wall thickening. Tip of nasogastric tube projects over mid stomach. Bones appear demineralized. IMPRESSION: Decreased small bowel distention since prior study. Electronically Signed  By: Lavonia Dana M.D.   On: 05/28/2018 08:16    Anti-infectives: Anti-infectives (From admission, onward)   Start     Dose/Rate Route Frequency Ordered Stop   05/10/18 0941  ceFAZolin (ANCEF) 2-4 GM/100ML-% IVPB    Note to Pharmacy:  Lisette Grinder   : cabinet override      05/10/18 0941 05/10/18 2144   05/01/18 1215  metroNIDAZOLE (FLAGYL) IVPB 500 mg  Status:  Discontinued     500 mg 100 mL/hr over 60 Minutes Intravenous Every 8 hours 05/01/18 1201 05/06/18 1153   04/30/18 1100  cefTRIAXone (ROCEPHIN) 2 g in sodium chloride 0.9 % 100 mL IVPB  Status:  Discontinued     2 g 200 mL/hr over 30 Minutes Intravenous Every 24 hours 04/29/18 1416 05/06/18 1153   04/29/18 1430  cefTRIAXone (ROCEPHIN) 1 g in sodium chloride 0.9 % 100 mL IVPB     1 g 200 mL/hr over 30 Minutes Intravenous Every 24 hours 04/29/18 1416 04/30/18 1429   04/29/18 1130  cefTRIAXone (ROCEPHIN) 1 g in sodium chloride 0.9 % 100 mL IVPB     1 g 200 mL/hr over 30 Minutes Intravenous  Once 04/29/18 1118 04/29/18 1251      Assessment/Plan: Problem List: Patient Active Problem List   Diagnosis Date Noted  . SBO (small bowel obstruction) s/p adhesiolysis & SB resection 05/10/2018 05/12/2018  . Bipolar disorder ([place])   . Schizophrenia ([place])   . Hypertension   . Anxiety   . Anemia   . Protein-calorie malnutrition, severe 05/02/2018  . Sepsis ([place]) 04/29/2018    Will get small bowel protocol ordered before removal of NG 19 Days Post-Op    LOS: 30 days   Matt B. Hassell Done, MD, [facility] [phone] beeper [phone]  05/29/2018 12:09 PM

## 2018-05-29 NOTE — Progress Notes (Signed)
PROGRESS NOTE        PATIENT DETAILS Name: Jeffrey Davila Age: 67 y.o. Sex: male Date of Birth: [birth year] Admit Date: 04/29/2018 Admitting Physician Starleen Arms, MD PCP:Uba, Reuel Boom, MD  Brief Narrative: Patient is a 66 y.o. male with history of schizophrenia/?  Cognitive dysfunction (dementia), hypertension, BPH, remote history (in the 1990s) of trauma causing small bowel injury requiring laparotomy-mostly wheelchair-bound-presented to the hospital with approximately 1 week history of intermittent vomiting, confusion for a few days prior to this hospital admission-presented with sepsis secondary to UTI, vomiting and significant constipation.  Patient was started on IV antibiotics and admitted to the hospital service,initial imaging was suggestive of constipation causing gastric distention-NG tube was placed, however upon further evaluation with a CT scan he was found  to have a small bowel obstruction.  General surgery was consulted, even with n.p.o. status/NG tube decompression-he continues to have SBO-patient's sister has refused to sign consent for surgery and wanted patient to receive several days of TNA before she consented to surgery.  Subsequently underwent laparotomy with lysis of adhesions on 10/15, postoperative course complicated by ileus and low-grade fever.  See below for further details.  Subjective: And in bed denies any headache or chest pain, no abdominal pain.   Assessment/Plan:  Small bowel obstruction: Underwent laparotomy with lysis of adhesions 05/10/2018 - postoperative course complicated by ileus and likely repeat obstruction as per CT scan done on 05/20/2018 with a left lower quadrant transition point, complicated by lack of activity and baseline wheelchair/bedbound status, no surgery monitoring and following will defer management of this problem to general surgery.    Currently being managed conservatively with bowel rest, still has  significant return with NG tube on 05/24/2018 Case discussed with surgery PA on 05/26/2018 he was given smog enema on 05/25/2018 with limited success, he also received MiraLAX via his NG tube on 05/26/2018 with a small bowel movement. Continue with IV TNA and monitor, minimize narcotic use, monitor electrolytes.  Encouraged to sit up in the chair and increase activity.  Surgery has clamped NG tube on 05/27/2018, on 05/28/2018 he had a large bowel movement, repeat x-ray on 05/29/2018 is stable with almost resolved SBO, discussed with Dr. Daphine Deutscher general surgeon on 05/29/2018 will place him on 3 days of scheduled Dulcolax suppository, speech therapy also requested to see the patient and will try to start oral diet soon, keep NG clamped.  Low-grade fever on 10/19: No foci of infection apparent-could have been from atelectasis-not on any antimicrobial therapy.  Blood culture on 10/20 neg so far, chest x-ray on 10/20 neg for PNA.  Remains afebrile since then.  Follow.  Sepsis secondary to complicated UTI: Sepsis pathophysiology has resolved, urine culture positive for Klebsiella-has completed a course of IV Rocephin.  Blood cultures remain negative.    Aspiration pneumonia: This occurred in the early part of his hospitalization-felt to be secondary to o SBO/vomiting with underlying frailty.  Has completed a course of antimicrobial therapy.  Continue to mobilize as much as possible.    Acute metabolic encephalopathy: Improved and back to his usual baseline.  Encephalopathy was present on initial presentation-this was felt secondary to UTI, and developing SBO.Marland Kitchen  Anemia: Suspect anemia secondary to acute illness, and IV fluid dilution.  Has been transfused a total of 3 units so far, last transfusion on 10/20.  Follow CBC  periodically.  No indication of any overt blood loss at this time.   AKI: Hemodynamically mediated, resolved.  Schizophrenia: Appears stable-continue IV Depakote and olanzapine.    Hypertension:  Blood pressure appears stable-continue to hold antihypertensives.   Severe malnutrition: Continue TNA-we will start low-dose nutritional supplements when diet is more stable.    Deconditioning/debility: Suspect has significant amount of debility at baseline-he is very cachectic-deconditioning/debility has worsened due to acute illness/SBO.  PT following.  Hyperkalemia - resolved after 1 dose of Lasix will monitor.  Other issues:  Patient's sister initially wanted guarantees with surgery, wanted transfer to [facility] (refused by [facility]).  See my prior notes.  I have reviewed notes by Dr. Marena Chancy issues with patient's sister refusing NG tube placement, wanting transfer to [facility] ([facility] [place] [facility]).  Per nursing staff-she has on occasion been very disruptive to their workflow and in the care of the patient.  I completely agree with Dr. Marena Chancy concerns whether patient's sister is the best person to be making decisions for this patient-given numerous documentations by nursing staff and by prior MDs.  Social work, risk Insurance account manager, Camera operator and palliative care following.   Please  see my note from 05/17/2018.   DVT Prophylaxis:  Prophylactic Heparin   Code Status: Full code   Family Communication:  Sister bedside on 05/22/2018, 05/24/18 with nursing staff present  Disposition Plan:  Remain inpatient-ultimately to SNF when he is more stable.  Antimicrobial agents: Anti-infectives (From admission, onward)   Start     Dose/Rate Route Frequency Ordered Stop   05/10/18 0941  ceFAZolin (ANCEF) 2-4 GM/100ML-% IVPB    Note to Pharmacy:  [name]   : cabinet override      05/10/18 0941 05/10/18 2144   05/01/18 1215  metroNIDAZOLE (FLAGYL) IVPB 500 mg  Status:  Discontinued     500 mg 100 mL/hr over 60 Minutes Intravenous Every 8 hours 05/01/18 1201 05/06/18 1153   04/30/18 1100  cefTRIAXone (ROCEPHIN) 2 g in sodium chloride 0.9 % 100 mL IVPB   Status:  Discontinued     2 g 200 mL/hr over 30 Minutes Intravenous Every 24 hours 04/29/18 1416 05/06/18 1153   04/29/18 1430  cefTRIAXone (ROCEPHIN) 1 g in sodium chloride 0.9 % 100 mL IVPB     1 g 200 mL/hr over 30 Minutes Intravenous Every 24 hours 04/29/18 1416 04/30/18 1429   04/29/18 1130  cefTRIAXone (ROCEPHIN) 1 g in sodium chloride 0.9 % 100 mL IVPB     1 g 200 mL/hr over 30 Minutes Intravenous  Once 04/29/18 1118 04/29/18 1251      Procedures: None  CONSULTS:  None  Time spent: 25 minutes   MEDICATIONS: Scheduled Meds: . benztropine  1 mg Oral BID  . bisacodyl  10 mg Rectal Daily  . Chlorhexidine Gluconate Cloth  6 each Topical 2 times per day on Tue  . furosemide  40 mg Intravenous Once  . heparin  5,000 Units Subcutaneous Q8H  . lip balm  1 application Topical BID  . OLANZapine zydis  10 mg Oral BID  . polyethylene glycol  17 g Oral Daily  . polyvinyl alcohol  1 drop Both Eyes TID   Continuous Infusions: . sodium chloride 10 mL/hr at 05/26/18 1320  . lactated ringers 10 mL/hr at 05/11/18 0617  . TPN ADULT (ION) 65 mL/hr at 05/28/18 1730  . TPN ADULT (ION)    . valproate sodium 250 mg (05/29/18 0943)   PRN  Meds:.sodium chloride, acetaminophen, albuterol, alum & mag hydroxide-simeth, diphenhydrAMINE, magic mouthwash, menthol-cetylpyridinium, metoprolol tartrate, ondansetron (ZOFRAN) IV, phenol, sodium chloride flush, traMADol   PHYSICAL EXAM: Vital signs: Vitals:   05/28/18 0455 05/28/18 1539 05/28/18 2133 05/29/18 0629  BP: 96/65 92/62 100/66 98/74  Pulse: 91 89 85 87  Resp: 16 18    Temp: 98.3 F (36.8 C) 99.5 F (37.5 C) 99.8 F (37.7 C) 97.9 F (36.6 C)  TempSrc: Oral     SpO2: 98% 99% 98% 100%  Weight:      Height:       Filed Weights   04/29/18 0842  Weight: 49.9 kg   Body mass index is 17.75 kg/m.   Exam  Awake, NG in place [place].AT,PERRAL Supple Neck,No JVD, No cervical lymphadenopathy appriciated.  Symmetrical Chest wall  movement, Good air movement bilaterally, CTAB RRR,No Gallops, Rubs or new Murmurs, No Parasternal Heave +ve B.Sounds, Abd Soft, No tenderness, No organomegaly appriciated, No rebound - guarding or rigidity. No Cyanosis, Clubbing or edema, No new Rash or bruise    I have personally reviewed following labs and imaging studies  LABORATORY DATA: CBC: Recent Labs  Lab 05/23/18 0347 05/24/18 0908 05/29/18 0331  WBC  --  13.4* 8.5  NEUTROABS 7.1  --   --   HGB  --  10.2* 8.8*  HCT  --  33.0* 30.8*  MCV  --  93.8 96.0  PLT  --  524* 295    Basic Metabolic Panel: Recent Labs  Lab 05/23/18 0347 05/24/18 0908 05/25/18 0411 05/26/18 0445 05/27/18 0402 05/28/18 0443 05/29/18 0331  NA 135 136 136 138 137 138 137  K 4.5 4.8 5.2* 5.0 4.5 4.6 4.3  CL 105 104 108 109 107 110 110  CO2 25 24 24 25 25 24 22   GLUCOSE 93 105* 98 87 99 94 97  BUN 37* 41* 61* 69* 58* 49* 41*  CREATININE 0.72 0.73 0.95 0.87 0.80 0.79 0.74  CALCIUM 9.4 9.7 9.5 9.3 9.3 9.3 9.1  MG 2.1 2.3  --   --  2.5* 2.0 1.7  PHOS 4.3  --   --  5.3* 4.1  --   --     GFR: Estimated Creatinine Clearance: 64.1 mL/min (by C-G formula based on SCr of 0.74 mg/dL).  Liver Function Tests: Recent Labs  Lab 05/23/18 0347  AST 10*  ALT 8  ALKPHOS 114  BILITOT 0.3  PROT 7.1  ALBUMIN 2.7*   No results for input(s): LIPASE, AMYLASE in the last 168 hours. No results for input(s): AMMONIA in the last 168 hours.  Coagulation Profile: No results for input(s): INR, PROTIME in the last 168 hours.  Cardiac Enzymes: No results for input(s): CKTOTAL, CKMB, CKMBINDEX, TROPONINI in the last 168 hours.  BNP (last 3 results) No results for input(s): PROBNP in the last 8760 hours.  HbA1C: No results for input(s): HGBA1C in the last 72 hours.  CBG: Recent Labs  Lab 05/28/18 0113 05/28/18 1232 05/28/18 1606 05/29/18 0028 05/29/18 0825  GLUCAP 96 105* 88 100* 73    Lipid Profile: No results for input(s): CHOL, HDL,  LDLCALC, TRIG, CHOLHDL, LDLDIRECT in the last 72 hours.  Thyroid Function Tests: No results for input(s): TSH, T4TOTAL, FREET4, T3FREE, THYROIDAB in the last 72 hours.  Anemia Panel: No results for input(s): VITAMINB12, FOLATE, FERRITIN, TIBC, IRON, RETICCTPCT in the last 72 hours.  Urine analysis:    Component Value Date/Time   COLORURINE YELLOW 05/15/2018 1109   APPEARANCEUR  CLEAR 05/15/2018 1109   LABSPEC 1.013 05/15/2018 1109   PHURINE 7.0 05/15/2018 1109   GLUCOSEU NEGATIVE 05/15/2018 1109   HGBUR SMALL (A) 05/15/2018 1109   BILIRUBINUR NEGATIVE 05/15/2018 1109   KETONESUR NEGATIVE 05/15/2018 1109   PROTEINUR NEGATIVE 05/15/2018 1109   UROBILINOGEN 1.0 06/08/2010 1523   NITRITE NEGATIVE 05/15/2018 1109   LEUKOCYTESUR TRACE (A) 05/15/2018 1109    Sepsis Labs: Lactic Acid, Venous    Component Value Date/Time   LATICACIDVEN 1.07 04/29/2018 1334    MICROBIOLOGY: No results found for this or any previous visit (from the past 240 hour(s)).  RADIOLOGY STUDIES/RESULTS: Ct Abdomen Pelvis Wo Contrast  Result Date: 04/30/2018 CLINICAL DATA:  Nausea, bilious vomiting EXAM: CT ABDOMEN AND PELVIS WITHOUT CONTRAST TECHNIQUE: Multidetector CT imaging of the abdomen and pelvis was performed following the standard protocol without IV contrast. Sagittal and coronal MPR images reconstructed from axial data set. Oral contrast was not administered. COMPARISON:  None FINDINGS: Lower chest: Emphysematous changes with BILATERAL lower lobe infiltrates consistent with either pneumonia or aspiration. Hepatobiliary: Liver unremarkable. Gallbladder not well visualized due to streak artifacts, grossly unremarkable. Pancreas: Normal appearance Spleen: Normal appearance Adrenals/Urinary Tract: Question adrenal thickening bilaterally. No obvious renal mass or hydronephrosis. Ureters not visualized. Foley catheter decompresses urinary bladder. Stomach/Bowel: Nasogastric tube in stomach. Low-attenuation fluid  distends stomach and multiple proximal small bowel loops, with small bowel loops up to 4.1 cm diameter. Distal small bowel loops and colon are decompressed. Scattered stool throughout colon. Findings likely represent mid small bowel obstruction. Appendix not visualized. Suspected rectal wall thickening. Vascular/Lymphatic: Atherosclerotic calcification aorta. Aorta normal caliber. Reproductive: Minimal prostatic enlargement and scattered prostatic calcifications. Other: No definite free air or free fluid. Nonspecific stranding of presacral fat. Musculoskeletal: No acute osseous findings. IMPRESSION: Dilated proximal and decompressed distal small bowel loops compatible with small bowel obstruction, likely in the mid small bowel. Due to lack of fat planes, streak artifacts, and lack of contrast, unable to localize site of obstruction. No evidence of perforation/free air. Question rectal wall thickening, recommend correlation with proctoscopy. Emphysematous changes with bibasilar pulmonary infiltrates either representing pneumonia or aspiration. Electronically Signed   By: Ulyses Southward M.D.   On: 04/30/2018 19:38   Dg Abd 1 View  Result Date: 05/20/2018 CLINICAL DATA:  NG tube adjustment EXAM: ABDOMEN - 1 VIEW COMPARISON:  05/20/2018 FINDINGS: Esophageal tube tip and side-port project over gastric fundus, somewhat abrupt appearing curvature at the distal tip with persistent gaseous dilatation. Continued marked gaseous enlargement of bowel in the left abdomen up to 5 cm. Moderate stool in the colon. Residual contrast within the bladder. IMPRESSION: 1. Esophageal tube tip and side port overlie the gastric fundus, somewhat abrupt curvature of the tip, could be due to mild kinking. 2. Persistent dilatation of stomach and left abdominal small bowel consistent with a bowel obstruction. Electronically Signed   By: Jasmine Pang M.D.   On: 05/20/2018 18:37   Dg Abd 1 View  Result Date: 05/20/2018 CLINICAL DATA:  NG  tube placement. EXAM: ABDOMEN - 1 VIEW COMPARISON:  CT abdomen pelvis from same day. FINDINGS: Tip of the NG tube in the stomach with the proximal side port at the gastroesophageal junction. Dilated loops of small bowel in the left abdomen are unchanged. Contrast is seen within the renal collecting systems and bladder. No acute osseous abnormality. IMPRESSION: 1. NG tube tip in the stomach with the proximal side port at the gastroesophageal junction. Consider advancing 2-3 cm. 2. Unchanged small bowel  obstruction. Electronically Signed   By: Obie Dredge M.D.   On: 05/20/2018 16:47   Dg Abd 1 View  Result Date: 05/16/2018 CLINICAL DATA:  Feeding tube advanced today. EXAM: ABDOMEN - 1 VIEW COMPARISON:  Plain films of the abdomen dated 05/15/2018 and 05/14/2018. FINDINGS: Enteric tube is coiled in the stomach with tip at the level of the gastric fundus/cardia. Distended gas-filled loops of large and small bowel are again appreciated throughout the abdomen and upper pelvis. IMPRESSION: 1. Feeding tube is coiled in the stomach with tip at the level of the gastric fundus/cardia. 2. Persistent gaseous distention of both large and small bowel loops throughout the abdomen and pelvis, compatible with previous reports of obstruction or ileus. Electronically Signed   By: Bary Richard M.D.   On: 05/16/2018 20:26   Dg Abd 1 View  Result Date: 05/03/2018 CLINICAL DATA:  Small bowel obstruction EXAM: ABDOMEN - 1 VIEW COMPARISON:  05/02/2018 FINDINGS: NG tube is in the stomach. Gaseous distention of the stomach and left abdominal small bowel loops. Gas and stool within nondistended colon. No free air or organomegaly. IMPRESSION: Gaseous distention of stomach and left abdominal small bowel loops likely reflecting small bowel obstruction. NG tube is in the stomach. Electronically Signed   By: Charlett Nose M.D.   On: 05/03/2018 10:05   Dg Abd 1 View  Result Date: 05/02/2018 CLINICAL DATA:  Small bowel obstruction  EXAM: ABDOMEN - 1 VIEW COMPARISON:  May 01, 2018 abdominal radiograph and CT abdomen and pelvis April 30, 2018 FINDINGS: Nasogastric tube no longer present. There is slightly less bowel dilatation in the left upper quadrant compared to 1 day prior. There is moderate air in the stomach. No bowel dilatation elsewhere. No air-fluid levels. No free air. There is moderate stool in the colon. There is atelectatic change in the right lung base. IMPRESSION: Nasogastric tube no longer appreciable. Less small bowel dilatation in the left upper quadrant. Question resolving bowel obstruction. No free air evident. Electronically Signed   By: Bretta Bang III M.D.   On: 05/02/2018 14:20   Ct Abdomen Pelvis W Contrast  Result Date: 05/20/2018 CLINICAL DATA:  Nausea and vomiting. 10 days status post laparoscopic lysis of adhesions and bowel resection. EXAM: CT ABDOMEN AND PELVIS WITH CONTRAST TECHNIQUE: Multidetector CT imaging of the abdomen and pelvis was performed using the standard protocol following bolus administration of intravenous contrast. CONTRAST:  ISOVUE-300 IOPAMIDOL (ISOVUE-300) INJECTION 61% COMPARISON:  04/30/2018 FINDINGS: Lower Chest: Decreased airspace disease seen in both lower lobes, likely due to resolving pneumonia. Hepatobiliary: A few tiny left hepatic lobe cysts are seen. No hepatic masses identified. Tiny calcified gallstones or layering sludge noted, however there is no evidence of cholecystitis or biliary ductal dilatation. Pancreas:  No mass or inflammatory changes. Spleen: Within normal limits in size and appearance. Adrenals/Urinary Tract: No masses identified. No evidence of hydronephrosis. Stomach/Bowel: Small amount of free air is seen, likely postoperative in etiology. No abscess identified. Multiple moderately dilated fluid-filled small bowel loops are seen in the left abdomen, with transition to nondilated small bowel in the left lower quadrant in area of surgical staples.  This is consistent with a small-bowel obstruction. No mass or inflammatory process identified. Moderate amount of stool again seen in the right colon. Vascular/Lymphatic: No pathologically enlarged lymph nodes. No abdominal aortic aneurysm. Reproductive: Mild-to-moderate enlargement of prostate gland shows no significant change. Other: Subcutaneous emphysema is seen in the left anterior abdominal wall, likely postoperative in etiology. Musculoskeletal:  No suspicious bone lesions identified. IMPRESSION: Small bowel obstruction, with transition point in left lower quadrant. This may be due to adhesion, as no obstructing mass or inflammatory process identified. Small amount of postop intraperitoneal air and subcutaneous emphysema. No evidence of abscess. Cholelithiasis.  No radiographic evidence of cholecystitis. Decreased bilateral lower lobe airspace disease, consistent with resolving pneumonia or inflammatory process. Electronically Signed   By: Myles Rosenthal M.D.   On: 05/20/2018 13:41   Dg Chest Port 1 View  Result Date: 05/29/2018 CLINICAL DATA:  Order for SBO EXAM: PORTABLE CHEST 1 VIEW COMPARISON:  Radiograph 05/15/2018 FINDINGS: NG tube extends into the stomach. PICC line unchanged. Normal cardiac silhouette with ectatic aorta. Lungs are hyperinflated. Remote RIGHT rib fractures. IMPRESSION: No acute cardiopulmonary process. Electronically Signed   By: Genevive Bi M.D.   On: 05/29/2018 10:41   Dg Chest Port 1 View  Result Date: 05/15/2018 CLINICAL DATA:  Shortness of breath. Patient 5 days postop bowel resection with lysis of adhesions. EXAM: PORTABLE CHEST 1 VIEW COMPARISON:  05/11/2018 FINDINGS: Right-sided PICC line with tip over the SVC. Lungs are adequately inflated with subtle left retrocardiac opacification which may be due to vascular crowding or atelectasis and less likely infection. Remainder of the lungs are clear. Cardiomediastinal silhouette is within normal. Persistent evidence of  free peritoneal air compatible with patient's recent surgery. IMPRESSION: Mild left base opacification likely atelectasis or vascular crowding and less likely infection. Persistent free peritoneal air compatible recent postop state. Right-sided PICC line with tip over the SVC. Electronically Signed   By: Elberta Fortis M.D.   On: 05/15/2018 09:38   Dg Chest Port 1 View  Result Date: 05/11/2018 CLINICAL DATA:  Shortness of breath, hypertension, pneumonia, dementia, former smoker EXAM: PORTABLE CHEST 1 VIEW COMPARISON:  Portable exam 0945 hours compared to 05/09/2018 FINDINGS: Nasogastric tube extends into stomach. RIGHT arm PICC line tip projects over SVC. Normal heart size and pulmonary vascularity. Elongation of thoracic aorta. Lungs appear emphysematous but clear. No pleural effusion or pneumothorax. Bones demineralized. Free air identified under the hemidiaphragms bilaterally; EHR indicates patient underwent a laparoscopic procedure on 05/10/2018, small-bowel resection and lysis of adhesions for small-bowel obstruction prior operative note. IMPRESSION: COPD changes without infiltrate. Free air consistent with abdominal surgery 1 day ago. Electronically Signed   By: Ulyses Southward M.D.   On: 05/11/2018 10:21   Dg Chest Port 1 View  Result Date: 05/09/2018 CLINICAL DATA:  Cough EXAM: PORTABLE CHEST 1 VIEW COMPARISON:  05/04/2018 FINDINGS: NG tube is stable. Right upper extremity PICC placed. Tip is at the cavoatrial junction. Lungs remain hyperaerated and clear. Small right pleural effusion is stable. No pneumothorax. Aorta remains somewhat prominent due to tortuosity and rotation of the thorax. IMPRESSION: New right upper extremity PICC with its tip at the cavoatrial junction. No active cardiopulmonary disease. Electronically Signed   By: Jolaine Click M.D.   On: 05/09/2018 07:37   Dg Chest Port 1v Same Day  Result Date: 05/01/2018 CLINICAL DATA:  history of schizophrenia, hypertension, BPH-mostly  wheelchair-bound-presented to the hospital with approximately 1 week history of intermittent vomiting, confusion for a few days prior to this hospital admission-presented with sepsis secondary to UTI, and significant constipation. C/o SOB EXAM: PORTABLE CHEST - 1 VIEW SAME DAY COMPARISON:  04/29/2018 FINDINGS: Lungs are hyperinflated. Subsegmental atelectasis or early infiltrate at the left lung base, new since previous. Right lung clear. Heart size normal.  Tortuous ectatic thoracic aorta. No effusion. No pneumothorax. Right anterolateral fourth  rib fracture, age indeterminate. Nasogastric tube extends to the stomach. IMPRESSION: 1. New patchy infiltrate or subsegmental atelectasis at the left lung base. 2. Right fourth rib fracture without pneumothorax. 3. Nasogastric tube placement to the stomach. Electronically Signed   By: Corlis Leak M.D.   On: 05/01/2018 08:24   Dg Abd 2 Views  Result Date: 05/05/2018 CLINICAL DATA:  Vomiting.  History of right lumpectomy. EXAM: ABDOMEN - 2 VIEW COMPARISON:  05/04/2018 FINDINGS: Improve dilation of small bowel loops with residual borderline gas-filled distended small bowel loops in the left upper quadrant of the abdomen. Normal colonic bowel gas pattern. No evidence of free intra-abdominal gas. Normal cardiac silhouette. Tortuosity of the thoracic aorta. Right pleural reflection versus small pleural effusion. No lobar airspace consolidation. Enteric catheter tip within the expected location the gastric body, side hole at the GE junction level. Right PICC line terminates in the expected location of the cavoatrial junction. IMPRESSION: Improved appearance of small-bowel obstruction. Enteric catheter with side hole at the level of the GE junction. Advancement may be considered. Possible small right pleural effusion. Electronically Signed   By: Ted Mcalpine M.D.   On: 05/05/2018 12:51   Dg Abd Acute W/chest  Result Date: 05/04/2018 CLINICAL DATA:  Follow-up small  bowel obstruction. EXAM: DG ABDOMEN ACUTE W/ 1V CHEST COMPARISON:  Abdominal x-rays 05/04/1999 19 dating back to 04/29/2018. CT abdomen and pelvis 04/30/2018. Chest x-rays 05/01/2018, 04/12/2018. FINDINGS: Nasogastric tube tip in the fundus of the stomach. Persistent marked gaseous distension of the jejunum in the LEFT UPPER QUADRANT, slowly progressive over the past 3 days, with air-fluid levels on the LATERAL decubitus image. No evidence of free intraperitoneal air. Normal caliber colon with moderate stool burden. Calcified uterine fibroid in the pelvic midline. Cardiac silhouette normal in size, unchanged. Thoracic aorta tortuous and atherosclerotic. Hilar and mediastinal contours otherwise unremarkable. Interval improvement in aeration in the LEFT lung base, with only minimal patchy opacities persisting. Lungs otherwise clear. Emphysematous changes in both lungs with scarring in the RIGHT mid lung, unchanged. Pleuroparenchymal scarring at the RIGHT lung base which accounts for the blunting of the costophrenic angle, unchanged dating back to the original 04/12/2018 examination. IMPRESSION: 1. Persistent partial small bowel obstruction which has slowly worsened over the past 3 days. 2. No evidence of free intraperitoneal air. 3. Improved aeration in the LEFT LOWER LOBE, with only mild atelectasis and/or pneumonia persisting. Electronically Signed   By: Hulan Saas M.D.   On: 05/04/2018 09:28   Dg Abd Portable 1v  Result Date: 05/29/2018 CLINICAL DATA:  Small bowel obstruction. EXAM: PORTABLE ABDOMEN - 1 VIEW COMPARISON:  One-view abdomen 05/28/2018 and 05/27/2018. FINDINGS: Bowel gas pattern is normal. No significant dilated loops of bowel remain. There is no free air. The side port of the NG tube is above the GE junction and could be advanced for more optimal positioning. The lung bases are clear. IMPRESSION: 1. Normal bowel gas pattern. No residual dilated loops of small bowel. 2. The side port of the  NG tube is above the GE junction. Electronically Signed   By: Marin Roberts M.D.   On: 05/29/2018 10:42   Dg Abd Portable 1v  Result Date: 05/28/2018 CLINICAL DATA:  Small-bowel obstruction EXAM: PORTABLE ABDOMEN - 1 VIEW COMPARISON:  Portable exam 0746 hours compared to 05/27/2018 FINDINGS: Scattered gas and minimal stool in colon. Few air-filled loops of small bowel in the LEFT mid abdomen, decreased in prominence since previous exam. Single loop demonstrates questionable mild wall  bowel wall thickening. Tip of nasogastric tube projects over mid stomach. Bones appear demineralized. IMPRESSION: Decreased small bowel distention since prior study. Electronically Signed   By: Ulyses Southward M.D.   On: 05/28/2018 08:16   Dg Abd Portable 1v  Result Date: 05/27/2018 CLINICAL DATA:  Small bowel obstruction EXAM: PORTABLE ABDOMEN - 1 VIEW COMPARISON:  05/24/2018 FINDINGS: There is a nasogastric tube with the tip projecting over the stomach. There is mild gaseous distension of the small bowel and colon without evidence of obstruction. There is no evidence of pneumoperitoneum, portal venous gas or pneumatosis. There are no pathologic calcifications along the expected course of the ureters. The osseous structures are unremarkable. IMPRESSION: There is a nasogastric tube with the tip projecting over the stomach. There is mild gaseous distension of the small bowel and colon without evidence of obstruction. Electronically Signed   By: Elige Ko   On: 05/27/2018 09:44   Dg Abd Portable 1v  Result Date: 05/24/2018 CLINICAL DATA:  Follow-up small bowel obstruction. EXAM: PORTABLE ABDOMEN - 1 VIEW COMPARISON:  Portable supine abdominal radiograph of May 21, 2018 FINDINGS: The colonic stool burden is moderate. There are loops of mildly distended gas-filled small bowel to the left of midline. There is gas and stool in the rectum. There is no free extraluminal gas. The esophagogastric tube tip in proximal port  project below the GE junction. The bony structures exhibit no acute abnormality. IMPRESSION: There are a few loops of mildly distended gas-filled small bowel to the left of midline. The colonic stool burden and gas pattern is within the limits of normal. There is no evidence of perforation. Electronically Signed   By: Audiel  [place] M.D.   On: 05/24/2018 09:25   Dg Abd Portable 1v-small Bowel Obstruction Protocol-initial, 8 Hr Delay  Result Date: 05/21/2018 CLINICAL DATA:  Small-bowel obstruction.  Follow-up exam. EXAM: PORTABLE ABDOMEN - 1 VIEW COMPARISON:  05/20/2018 FINDINGS: Significant improvement in small bowel dilation is noted since the prior study. The small bowel obstruction has essentially resolved radiographically. Mild generalized increased stool burden in the colon, unchanged from the previous exam. Midline abdominal wall surgical staples are stable. Nasogastric tube is well positioned, tip in the mid stomach. IMPRESSION: 1. Significant interval improvement from the previous day's exam. Small-bowel obstruction appears resolved radiographically. Electronically Signed   By: Amie Portland M.D.   On: 05/21/2018 13:12   Dg Abd Portable 1v  Result Date: 05/17/2018 CLINICAL DATA:  Small-bowel obstruction. EXAM: PORTABLE ABDOMEN - 1 VIEW COMPARISON:  05/16/2018.  05/15/2018.  CT 04/30/2018. FINDINGS: Surgical staples noted over the abdomen. NG tube noted with its tip in the stomach. Distended loops of small and large bowel are again noted. Similar findings noted on prior exam. No free air. Degenerative change in osteopenia lumbar spine and both hips. Prostate calcifications are noted. Aortoiliac atherosclerotic vascular calcification. Diffuse osteopenia degenerative changes lumbar spine and both hips. Mild blunting of the right costophrenic angle again noted suggesting scarring and/or tiny effusion. IMPRESSION: NG tube noted with its tip in the stomach. Dilated loops of small and large bowel are again  noted without significant interim change. No free air identified. Electronically Signed   By: Maisie Fus  Register   On: 05/17/2018 07:32   Dg Abd Portable 1v  Result Date: 05/15/2018 CLINICAL DATA:  NG tube placement EXAM: PORTABLE ABDOMEN - 1 VIEW COMPARISON:  05/15/2018 FINDINGS: NG tube is in placed into the fundus of the stomach. Continued gaseous distention of visualized upper abdominal bowel  loops. IMPRESSION: NG tube in the fundus of the stomach Electronically Signed   By: Charlett Nose M.D.   On: 05/15/2018 20:11   Dg Abd Portable 1v  Result Date: 05/15/2018 CLINICAL DATA:  Small bowel obstruction. EXAM: PORTABLE ABDOMEN - 1 VIEW COMPARISON:  One-view abdomen 05/14/2018 FINDINGS: Patient's retained left.  Heart size normal.  Lung bases are clear. Extend loops of large and small bowel are stable. There is no free air or pneumatosis. Surgical clips are noted. IMPRESSION: 1. Similar appearance of distended large and small bowel consistent with obstruction or ileus. Electronically Signed   By: Marin Roberts M.D.   On: 05/15/2018 08:10   Dg Abd Portable 1v  Result Date: 05/14/2018 CLINICAL DATA:  Abdominal pain EXAM: PORTABLE ABDOMEN - 1 VIEW COMPARISON:  05/13/2018 FINDINGS: Interval NG tube removal. Some interval improvement in gaseous distention of the bowel. Stool and air throughout the transverse colon. Postop staples again noted. Air in the rectum. No significant interval change. Degenerative changes of the spine. Basilar chronic interstitial changes and atelectasis. IMPRESSION: NG tube removed. Stable nonspecific bowel gas pattern, suspect resolving obstruction or ileus. Little interval change. Electronically Signed   By: Judie Petit.  Shick M.D.   On: 05/14/2018 10:23   Dg Abd Portable 1v  Result Date: 05/13/2018 CLINICAL DATA:  Abdominal pain, check NG placement EXAM: PORTABLE ABDOMEN - 1 VIEW COMPARISON:  05/09/2018 FINDINGS: Nasogastric catheter is noted with the tip in the stomach.  Proximal side port lies in the distal esophagus. This is stable from the prior exam. Scattered large and small bowel gas is noted. Postsurgical changes are now seen. Mild small bowel dilatation is noted which may be related to a postoperative ileus. IMPRESSION: Mild postoperative ileus. Nasogastric catheter as described. Electronically Signed   By: Alcide Clever M.D.   On: 05/13/2018 09:56   Dg Abd Portable 1v  Result Date: 05/09/2018 CLINICAL DATA:  Small-bowel obstruction EXAM: PORTABLE ABDOMEN - 1 VIEW COMPARISON:  05/08/2018; 05/06/2018; 05/05/2018 FINDINGS: Re-demonstrated gaseous distention of several loops of small bowel with index loop of small bowel within the left mid hemiabdomen measuring 3.6 cm in diameter. Moderate colonic stool burden. No supine evidence of pneumoperitoneum. No pneumatosis or portal venous gas. Presumed calcified fibroid overlies the left hemipelvis. Stable position of support apparatus. No acute osseus abnormalities. IMPRESSION: No change to slight improvement in suspected small-bowel obstruction. Electronically Signed   By: Simonne Come M.D.   On: 05/09/2018 07:47   Dg Abd Portable 1v  Result Date: 05/08/2018 CLINICAL DATA:  Follow up small bowel obstruction EXAM: PORTABLE ABDOMEN - 1 VIEW COMPARISON:  05/06/2018 FINDINGS: Scattered large and small bowel gas is noted. Multiple dilated loops of small bowel are again identified and stable in appearance. Nasogastric catheter is again noted within the stomach. No free air is seen. Degenerative changes of the lumbar spine are noted. IMPRESSION: Persistent small bowel dilatation. No new focal abnormality is noted. Electronically Signed   By: Alcide Clever M.D.   On: 05/08/2018 08:36   Dg Abd Portable 1v  Result Date: 05/06/2018 CLINICAL DATA:  Small bowel obstruction EXAM: PORTABLE ABDOMEN - 1 VIEW COMPARISON:  Portable exam at 0628 hours compared to 05/05/2018 FINDINGS: Stool scattered throughout colon. Persistent dilatation  of small bowel loops in the LEFT mid abdomen up to 4.9 cm diameter. No bowel wall thickening. Bones demineralized. No definite renal calcifications. IMPRESSION: Persistent small bowel dilatation consistent with small bowel obstruction. Little interval change. Electronically Signed   By: Loraine Leriche  Tyron Russell M.D.   On: 05/06/2018 10:21   Dg Abd Portable 1v-small Bowel Obstruction Protocol-initial, 8 Hr Delay  Result Date: 05/04/2018 CLINICAL DATA:  Small bowel obstruction protocol. 8 hour delay. EXAM: PORTABLE ABDOMEN - 1 VIEW COMPARISON:  05/03/2018 FINDINGS: Gaseous distention of left upper quadrant small bowel. Enteric tube with tip projected over the mid abdomen consistent with location in the upper stomach. Contrast material is demonstrated in the dilated small bowel. No contrast material is identified in the colon. This is suggestive of high-grade obstruction. Degenerative changes in the spine. IMPRESSION: Contrast material is demonstrated in the dilated small bowel but not in the colon consistent with high-grade obstruction. Electronically Signed   By: Burman Nieves M.D.   On: 05/04/2018 01:55   Dg Abd Portable 1v-small Bowel Obstruction Protocol-initial, 8 Hr Delay  Result Date: 05/01/2018 CLINICAL DATA:  Small bowel obstruction. EXAM: PORTABLE ABDOMEN - 1 VIEW COMPARISON:  Radiograph earlier this day at 1109 hour, CT yesterday. FINDINGS: Enteric tube in place with tip in the stomach, side-port just beyond the gastroesophageal junction. Improving small bowel dilatation in the left abdomen. Moderate stool in the right and transverse colon. No evidence of free air. Pelvic calcification may be stone in the urinary bladder or exophytic prostate calcification. IMPRESSION: Improving small bowel dilatation in the left abdomen since radiograph earlier this day. Electronically Signed   By: Narda Rutherford M.D.   On: 05/01/2018 21:14   Dg Abd Portable 1v-small Bowel Protocol-position Verification  Result Date:  05/01/2018 CLINICAL DATA:  Nasogastric tube placement EXAM: PORTABLE ABDOMEN - 1 VIEW COMPARISON:  Portable exam 1109 hours compared to 04/30/2018 FINDINGS: Tip of nasogastric tube projects over mid stomach. Dilated small bowel loops are seen in the LEFT mid abdomen. Increased stool in RIGHT colon. Lung bases appear emphysematous with subsegmental atelectasis at LEFT base. Bones demineralized. IMPRESSION: Tip of nasogastric tube projects over mid stomach. Small bowel dilatation in LEFT mid abdomen with increased stool in RIGHT colon. Electronically Signed   By: Ulyses Southward M.D.   On: 05/01/2018 11:22   Dg Abd Portable 1v  Result Date: 04/30/2018 CLINICAL DATA:  67 year old male with vomiting. EXAM: PORTABLE ABDOMEN - 1 VIEW COMPARISON:  04/29/2018 abdominal radiographs. FINDINGS: Portable AP semi upright and supine views at 0847 hours. Enteric tube has been placed and the stomach now appears decompressed. NG tube side hole at the gastric body. Non obstructed bowel gas pattern. Moderate volume of retained stool redemonstrated in the transverse colon and distal sigmoid/rectum. Negative visible lung bases. No pneumoperitoneum. Stable abdominal and pelvic visceral contours. Dystrophic calcification in the pelvis. Stable pelvic catheter, likely a Foley. No acute osseous abnormality identified. IMPRESSION: 1. NG tube in place with resolved gastric distention. 2. Non obstructed bowel gas pattern with moderate volume of retained stool. Electronically Signed   By: Odessa Fleming M.D.   On: 04/30/2018 09:06   Dg Abd Portable 1v  Result Date: 04/29/2018 CLINICAL DATA:  Nausea and vomiting EXAM: PORTABLE ABDOMEN - 1 VIEW COMPARISON:  None. FINDINGS: Gaseous distention of the stomach. Formed stool throughout the colon. No concerning mass effect or calcification. IMPRESSION: Prominent gaseous distension of the stomach. Constipated appearance. Electronically Signed   By: Marnee Spring M.D.   On: 04/29/2018 14:44   [place] Ekg Site  Rite  Result Date: 05/04/2018 If Site Rite image not attached, placement could not be confirmed due to current cardiac rhythm.    LOS: 30 days   Signature  Susa Raring M.D on 05/29/2018  at 10:59 AM   -  To page go to [email or web address] - password [facility]

## 2018-05-29 NOTE — Evaluation (Signed)
SLP Cancellation Note  Patient Details Name: Jeffrey Davila MRN: [id number] DOB: 23-Jun-1951   Cancelled treatment:       Reason Eval/Treat Not Completed: Other (comment)  New order for speech/swallow evaluation received, note pt has been npo for significant length of time and has TNA going.  Also appers pt continues with NG in place.  Will see pt next date 05/30/18 for evaluation.  Thanks.    Chales Abrahams 05/29/2018, 10:24 AM  Donavan Burnet, MS [facility] SLP Acute Rehab Services Pager [phone] Office [phone]

## 2018-05-29 NOTE — Progress Notes (Signed)
PHARMACY - ADULT TOTAL PARENTERAL NUTRITION CONSULT NOTE   Pharmacy Consult: TPN Indication: SBO  Patient Measurements: Height: 5\' 6"  (167.6 cm) Weight: 110 lb (49.9 kg) IBW/kg (Calculated) : 63.8 TPN AdjBW (KG): 49.9 Body mass index is 17.75 kg/m.  Assessment:  51 YOM admitted on 10/4 with sepsis. Found to have PNA and a possible SBO on 10/5 CT. Pt has a history of SBO s/p colectomy in 1993 (history (in the 1990s) of trauma causing small bowel injury requiring laparotomy). An NG tube was placed with improving small bowel dilatation but patient had 3-4 episodes of vomiting overnight on 10/7-10/8 and the NG tube had to be replaced due to incorrect placement. Pharmacy now consulted to manage TPN due to worsening SBO and concern for adhesions that require surgery.  Actual body is significantly below ideal body weight.  Of note, patient is extremely cachectic. Patient hx unreliable due to mental status.  GI: s/p ex-lap with LOA, SBR and anastomosis on 10/15. NG O/P down to (bilous on 10/30). Abd xray shows moderate stool burden, SMOG daily x3 doses, LBM 10/31, Miralax. NGT clamped, no O/P Endo: No hx DM - CBGs controlled, SSI d/c'd 10/14 Lytes: all WNL Renal: SCr down 0.74, BUN down to 41 - UOP 0.4 ml/kg/hr, LR at [facility] Pulm: stable on RA Cards: VSS  Hepatobil: LFTs / tbili / TG WNL Neuro: Schizophrenia/dementia. Benztropine, Zyprexa, IV valproate. Can't really make appropriate decisions ID: s/p abx for Klebsiella UTI and aspiration PNA - afebrile, WBC WNL TPN Access: double lumen PICC placed 05/05/18 TPN start date: 05/05/18  Nutritional Goals (per RD rec on 10/28): 1550-1750kCal, 85-100 gm protein, >1.5 L fluid per day  Current Nutrition:  TPN  Plan:  Continue TPN at 65 ml/hr, providing 94g AA, 218g CHO and 47g ILE for a total of 1585 kCals per day, meeting 100% of patient needs Electrolytes in TPN: increase Mag, Cl:Ac 1:1 Add MVI, trace elements, Pepcid 20mg  in TPN F/U AM  labs, plans for advancing diet / weaning TPN   Jamisen Duerson D. Laney Potash, PharmD, BCPS, BCCCP 05/29/2018, 8:34 AM

## 2018-05-30 LAB — COMPREHENSIVE METABOLIC PANEL
ALBUMIN: 2.7 g/dL — AB (ref 3.5–5.0)
ALT: 10 U/L (ref 0–44)
AST: 11 U/L — AB (ref 15–41)
Alkaline Phosphatase: 104 U/L (ref 38–126)
Anion gap: 5 (ref 5–15)
BUN: 42 mg/dL — AB (ref 8–23)
CHLORIDE: 111 mmol/L (ref 98–111)
CO2: 24 mmol/L (ref 22–32)
CREATININE: 0.64 mg/dL (ref 0.61–1.24)
Calcium: 9.3 mg/dL (ref 8.9–10.3)
GFR calc Af Amer: >60 mL/min (ref >60)
Glucose, Bld: 101 mg/dL — ABNORMAL HIGH (ref 70–99)
Potassium: 4 mmol/L (ref 3.5–5.1)
Sodium: 140 mmol/L (ref 135–145)
Total Bilirubin: 0.4 mg/dL (ref 0.3–1.2)
Total Protein: 7.4 g/dL (ref 6.5–8.1)

## 2018-05-30 LAB — CBC WITH DIFFERENTIAL/PLATELET
ABS IMMATURE GRANULOCYTES: 0.05 10*3/uL (ref 0.00–0.07)
BASOS ABS: 0.1 10*3/uL (ref 0.0–0.1)
BASOS PCT: 1 %
EOS ABS: 0.2 10*3/uL (ref 0.0–0.5)
Eosinophils Relative: 2 %
HCT: 31.8 % — ABNORMAL LOW (ref 39.0–52.0)
Hemoglobin: 9.9 g/dL — ABNORMAL LOW (ref 13.0–17.0)
Immature Granulocytes: 1 %
Lymphocytes Relative: 22 %
Lymphs Abs: 2.1 10*3/uL (ref 0.7–4.0)
MCH: 29.2 pg (ref 26.0–34.0)
MCHC: 31.1 g/dL (ref 30.0–36.0)
MCV: 93.8 fL (ref 80.0–100.0)
Monocytes Absolute: 0.8 10*3/uL (ref 0.1–1.0)
Monocytes Relative: 8 %
NEUTROS ABS: 6.5 10*3/uL (ref 1.7–7.7)
NEUTROS PCT: 66 %
NRBC: 0 % (ref 0.0–0.2)
PLATELETS: 284 10*3/uL (ref 150–400)
RBC: 3.39 MIL/uL — ABNORMAL LOW (ref 4.22–5.81)
RDW: 16.9 % — AB (ref 11.5–15.5)
WBC: 9.7 10*3/uL (ref 4.0–10.5)

## 2018-05-30 LAB — PREALBUMIN: PREALBUMIN: 34.4 mg/dL (ref 18–38)

## 2018-05-30 LAB — MAGNESIUM: Magnesium: 1.9 mg/dL (ref 1.7–2.4)

## 2018-05-30 LAB — GLUCOSE, CAPILLARY
GLUCOSE-CAPILLARY: 96 mg/dL (ref 70–99)
GLUCOSE-CAPILLARY: 91 mg/dL (ref 70–99)
Glucose-Capillary: 84 mg/dL (ref 70–99)

## 2018-05-30 LAB — PHOSPHORUS: Phosphorus: 3.9 mg/dL (ref 2.5–4.6)

## 2018-05-30 LAB — TRIGLYCERIDES: Triglycerides: 30 mg/dL (ref <150)

## 2018-05-30 MED ORDER — TRAVASOL 10 % IV SOLN
INTRAVENOUS | Status: AC
  Administered 2018-05-30: 17:00:00 via INTRAVENOUS
  Filled 2018-05-30: qty 990

## 2018-05-30 MED ORDER — TRAVASOL 10 % IV SOLN
INTRAVENOUS | Status: DC
  Filled 2018-05-30: qty 936

## 2018-05-30 NOTE — Progress Notes (Addendum)
[facility] Progress Note  20 Days Post-Op  Subjective: CC-  Patient just had another BM, getting washed up.  Xray yesterday showed contrast in colon and no evidence of bowel obstruction.  Objective: Vital signs in last 24 hours: Temp:  [98 F (36.7 C)-98.2 F (36.8 C)] 98 F (36.7 C) (11/03 2045) Pulse Rate:  [104] 104 (11/03 2045) Resp:  [16] 16 (11/03 2045) BP: (97-100)/(68-78) 100/78 (11/03 2045) SpO2:  [99 %-100 %] 100 % (11/03 2045) Last BM Date: 05/29/18  Intake/Output from previous day: 11/03 0701 - 11/04 0700 In: 1811.9 [I.V.:1391.9; IV Piggyback:420] Out: 1675 [Urine:1675] Intake/Output this shift: Total I/O In: 189.4 [I.V.:136.9; IV Piggyback:52.5] Out: -   PE: Gen:  Alert, NAD, nonverbal Pulm:  effort normal Abd: Soft, NT/ND, +BS, no HSM, incisions cdi with steri strips in place Skin: no rashes noted, warm and dry  Lab Results:  Recent Labs    05/29/18 0331 05/30/18 0353  WBC 8.5 9.7  HGB 8.8* 9.9*  HCT 30.8* 31.8*  PLT 295 284   BMET Recent Labs    05/28/18 0443 05/29/18 0331  NA 138 137  K 4.6 4.3  CL 110 110  CO2 24 22  GLUCOSE 94 97  BUN 49* 41*  CREATININE 0.79 0.74  CALCIUM 9.3 9.1   PT/INR No results for input(s): LABPROT, INR in the last 72 hours. CMP     Component Value Date/Time   NA 137 05/29/2018 0331   K 4.3 05/29/2018 0331   CL 110 05/29/2018 0331   CO2 22 05/29/2018 0331   GLUCOSE 97 05/29/2018 0331   BUN 41 (H) 05/29/2018 0331   CREATININE 0.74 05/29/2018 0331   CALCIUM 9.1 05/29/2018 0331   PROT 7.1 05/23/2018 0347   ALBUMIN 2.7 (L) 05/23/2018 0347   AST 10 (L) 05/23/2018 0347   ALT 8 05/23/2018 0347   ALKPHOS 114 05/23/2018 0347   BILITOT 0.3 05/23/2018 0347   GFRNONAA >60 05/29/2018 0331   GFRAA >60 05/29/2018 0331   Lipase     Component Value Date/Time   LIPASE 204 (H) 04/29/2018 0904       Studies/Results: Dg Chest Port 1 View  Result Date: 05/29/2018 CLINICAL DATA:  Order for  SBO EXAM: PORTABLE CHEST 1 VIEW COMPARISON:  Radiograph 05/15/2018 FINDINGS: NG tube extends into the stomach. PICC line unchanged. Normal cardiac silhouette with ectatic aorta. Lungs are hyperinflated. Remote RIGHT rib fractures. IMPRESSION: No acute cardiopulmonary process. Electronically Signed   By: Genevive Bi M.D.   On: 05/29/2018 10:41   Dg Abd Portable 1v-small Bowel Obstruction Protocol-initial, 8 Hr Delay  Result Date: 05/29/2018 CLINICAL DATA:  8 hour delay small-bowel obstruction film. EXAM: PORTABLE ABDOMEN - 1 VIEW COMPARISON:  05/29/2018 FINDINGS: Contrast material demonstrated throughout the colon. No small or large bowel distention is identified. Enteric tube tip is in the left upper quadrant consistent with location in the upper stomach. Proximal side hole projects at or just above the expected location of the EG junction. No change in position. IMPRESSION: Contrast material throughout the colon. No evidence of bowel obstruction. Electronically Signed   By: Burman Nieves M.D.   On: 05/29/2018 23:44   Dg Abd Portable 1v  Result Date: 05/29/2018 CLINICAL DATA:  Small bowel obstruction. EXAM: PORTABLE ABDOMEN - 1 VIEW COMPARISON:  One-view abdomen 05/28/2018 and 05/27/2018. FINDINGS: Bowel gas pattern is normal. No significant dilated loops of bowel remain. There is no free air. The side port of the NG tube is above  the GE junction and could be advanced for more optimal positioning. The lung bases are clear. IMPRESSION: 1. Normal bowel gas pattern. No residual dilated loops of small bowel. 2. The side port of the NG tube is above the GE junction. Electronically Signed   By: Marin Roberts M.D.   On: 05/29/2018 10:42    Anti-infectives: Anti-infectives (From admission, onward)   Start     Dose/Rate Route Frequency Ordered Stop   05/10/18 0941  ceFAZolin (ANCEF) 2-4 GM/100ML-% IVPB    Note to Pharmacy:  [name]   : cabinet override      05/10/18 0941 05/10/18 2144    05/01/18 1215  metroNIDAZOLE (FLAGYL) IVPB 500 mg  Status:  Discontinued     500 mg 100 mL/hr over 60 Minutes Intravenous Every 8 hours 05/01/18 1201 05/06/18 1153   04/30/18 1100  cefTRIAXone (ROCEPHIN) 2 g in sodium chloride 0.9 % 100 mL IVPB  Status:  Discontinued     2 g 200 mL/hr over 30 Minutes Intravenous Every 24 hours 04/29/18 1416 05/06/18 1153   04/29/18 1430  cefTRIAXone (ROCEPHIN) 1 g in sodium chloride 0.9 % 100 mL IVPB     1 g 200 mL/hr over 30 Minutes Intravenous Every 24 hours 04/29/18 1416 04/30/18 1429   04/29/18 1130  cefTRIAXone (ROCEPHIN) 1 g in sodium chloride 0.9 % 100 mL IVPB     1 g 200 mL/hr over 30 Minutes Intravenous  Once 04/29/18 1118 04/29/18 1251       Assessment/Plan Hx of prior lung surgery Acute metabolic encephalopathy Anemia- transfused 10/14 - hg 9.9 today AKI- resolved Hypertension- controlled Hypokalemia/Hyponatremia- resolved Hx of schizophrenia- cogentin/Zyprexa - very difficult to get answers from him Deconditioning - bedbound Malnutrition - severe- TNA. 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 He is having bowel function. Could d/c NG tube and start clear liquids, but patient is high aspiration risk and is recommended NPO by SLP. Needs NG for medication administration as he cannot tolerate sips without aspiration risk. Will discuss switching to Cortrak and initiating tube feeding with MD. Continue bowel regimen with daily miralax and dulcolax suppository. - OOB to chair - cont TNA for nutritional support at this time, once tolerating more to eat, will start to wean  ID -rocephin 10/4>>10/11, flagyl 10/6>>10/11 FEN - TPN, CLD when cleared by  SLP VTE -SCDs, SQ heparin Foley -condom cath   LOS: 31 days    Franne Forts , [facility] 05/30/2018, 8:48 AM Pager: [phone] Mon 7:00 am -11:30 AM Tues-Fri 7:00 am-4:30 pm Sat-Sun 7:00 am-11:30 am

## 2018-05-30 NOTE — Progress Notes (Signed)
Nutrition Follow-up  DOCUMENTATION CODES:   Severe malnutrition in context of chronic illness, Underweight  INTERVENTION:  - Recommend exchanging NG tube for Cortrak for pt comfort and medication administration (pt NPO per SLP)  If Cortrak placed, recommend initiation of tube feeds: - Start Osmolite 1.2 @ 25 ml/hr and increase by 10 ml q 8 hours until goal rate of 65 ml/hr (1560 ml/day) is reached  Recommended tube feeding regimen at goal rate provides 1872 kcal, 87 grams of protein, and 1279 ml free water (101% of estimated kcal needs, 100% of estimated protein needs).  - TPN per pharmacy Re-estimated needs: 1650-1850 kcal, 85-100 grams of protein, >/= 1.6 L fluid  - Placed nursing order for daily weights (verbal with readback per Dr. Candiss Norse)  NUTRITION DIAGNOSIS:   Severe Malnutrition related to chronic illness (persistent post-op ileus) as evidenced by severe fat depletion, severe muscle depletion, percent weight loss (14.7% weight loss in less than 2 months).  New diagnosis  GOAL:   Patient will meet greater than or equal to 90% of their needs  Met via TPN at increased rate  MONITOR:   Diet advancement, Weight trends, I & O's, Labs, Skin  REASON FOR ASSESSMENT:   Consult New TPN/TNA  ASSESSMENT:    Jeffrey Davila  is a 67 y.o. male, past medical history of schizophrenia, hypertension, BPH, who lives with his sister at home, for last 15 years, he is wheelchair dependent at baseline, but able to transfer from bed to chair, communicative and appropriate per sister, she reports over the last few days patient has been less interactive, sleeping more often during the day, and over the last 2 days almost with no significant oral intake, which prompted her to come to ED.  10/4 - NGT placed 10/8 - NGT removed, NGT replaced 10/10 - TPN started 10/15 - s/p ex lap, LOA, small bowel resection and anastomosis 10/17 - NGT clamped, advanced to clear liquids 10/18 - NGT removed,  advanced to full liquids 10/19 - back to clear liquids due to pt reporting emesis 10/20 - developed worsening ileus, NGT replaced, NPO 10/23 - NGT clamped, sips of clears from floor 10/24 - NGT removed, diet advanced to clear liquids, experienced emesis x 2 overnight 10/25 - NGT replaced, NPO 11/1 - NGT clamped  Per surgery, x-ray yesterday showed no evidence of bowel obstruction. NGT likely to be removed today. NGT not removed yet at time of RD visit.  SLP saw pt today with recommendations for NPO. Surgery considering switching enteral access to Cortrak for medication administration and initiation of tube feeds. Will leave TF recommendations.  Pt with 17.6 lb weight loss since 04/12/18. This is a 14.7% weight loss in less than 2 months which is significant for timeframe.  Discussed pt with RN and MD. Placed order per MD for daily weights to better monitor weight trends and adjust TPN as appropriate.  Discussed re-estimated needs with pharmacy. Noted plan to increase TPN rate today.  Spoke with pt at bedside. Pt nodding to RD questions. Pt asked RD to leave door open at end of visit.  Medications reviewed and include: Dulcolax suppository daily, Zyprexa 10 mg BID, Miralax daily, TPN  Current TPN: 65 ml/hr to provide 1585 kcal, 94 grams protein, 218 grams CHO, and 47 grams ILE (TPN rate to increase to 75 ml/hr today to meet pt's re-estimated needs which will provide 1790 kcal, 99 grams protein, 252 grams CHO, and 54 grams ILE)  Labs reviewed. CBG's: 91, 96 x  24 hours  UOP: 1675 ml x 24 hours I/O's: -1.5 L since admit  NUTRITION - FOCUSED PHYSICAL EXAM: Repeat NFPE. Initial NFPE completed on 04/29/18.    Most Recent Value  Orbital Region  Moderate depletion  Upper Arm Region  Severe depletion  Thoracic and Lumbar Region  Severe depletion  Buccal Region  Moderate depletion  Temple Region  Moderate depletion  Clavicle Bone Region  Severe depletion  Clavicle and Acromion Bone Region   Severe depletion  Scapular Bone Region  Moderate depletion  Dorsal Hand  Severe depletion  Patellar Region  Severe depletion  Anterior Thigh Region  Severe depletion  Posterior Calf Region  Severe depletion  Edema (RD Assessment)  None  Hair  Reviewed  Eyes  Reviewed  Mouth  Reviewed  Skin  Reviewed  Nails  Reviewed    NFPE shows progressing muscle and fat depletions. Pt now meets criteria for severe protein-calorie malnutrition in the context of chronic illness.   Diet Order:   Diet Order            Diet NPO time specified  Diet effective now              EDUCATION NEEDS:   No education needs have been identified at this time  Skin:  Skin Assessment: Skin Integrity Issues: Incisions: closed incision to abdomen  Last BM:  11/3 (large type 7)  Height:   Ht Readings from Last 1 Encounters:  05/30/18 _0  (1.676 m)    Weight:   Wt Readings from Last 1 Encounters:  05/30/18 46.4 kg    Ideal Body Weight:  64.5 kg  BMI:  Body mass index is 16.53 kg/m.  Estimated Nutritional Needs:   Kcal:  1650-1850  Protein:  85-100 grams  Fluid:  >/= 1.6 L    Gaynell Face, MS, RD, LDN Inpatient Clinical Dietitian Pager: [phone] Weekend/After Hours: [phone]

## 2018-05-30 NOTE — Progress Notes (Signed)
PMT progress note  Jeffrey Davila is awake alert resting in bed, he has an NGT which is currently clamped. Palliative care was initially consult her for goals of care discussions. Chart reviewed. Hospital course noted. Surgical colleagues following. Patient is status post diagnostic laparoscopy, underwent lysis of adhesions on 05-10-18. Patient's sister is noted to be the next of kin, decision maker if the patient is not able to make his own decisions. Overall, it appears that the patient will be appropriate to consider skilled nursing facility rehabilitation at the time of discharge. He remains on TPN for now. He is to try clear liquids. He is asking for some water. He is awake and alert. He is having bowel movements now.  BP 100/78 (BP Location: Left Arm)   Pulse (!) 104   Temp 98 F (36.7 C) (Oral)   Resp 16   Ht 5\' 6"  (1.676 m)   Wt 49.9 kg   SpO2 100%   BMI 17.75 kg/m  Labs imaging medications noted.   PPS 30%  Awake alert Slow to talk but is fully oriented and interactive NGT + No abd distension No edema Appears thin weak with muscle wasting S 1 S 2 Clear breath sounds  No family at bedside  Continue current mode of care.   No additional palliative specific recommendations at this time. Please call us if we can assist.  15 minutes spent  Rosalin Hawking MD [facility] health palliative medicine team [phone]

## 2018-05-30 NOTE — Progress Notes (Signed)
PROGRESS NOTE        PATIENT DETAILS Name: Jeffrey Davila Age: 67 y.o. Sex: male Date of Birth: 02-17-1951 Admit Date: 04/29/2018 Admitting Physician Starleen Arms, MD PCP:Uba, Reuel Boom, MD  Brief Narrative: Patient is a 67 y.o. male with history of schizophrenia/?  Cognitive dysfunction (dementia), hypertension, BPH, remote history (in the 1990s) of trauma causing small bowel injury requiring laparotomy-mostly wheelchair-bound-presented to the hospital with approximately 1 week history of intermittent vomiting, confusion for a few days prior to this hospital admission-presented with sepsis secondary to UTI, vomiting and significant constipation.  Patient was started on IV antibiotics and admitted to the hospital service,initial imaging was suggestive of constipation causing gastric distention-NG tube was placed, however upon further evaluation with a CT scan he was found  to have a small bowel obstruction.  General surgery was consulted, even with n.p.o. status/NG tube decompression-he continues to have SBO-patient's sister has refused to sign consent for surgery and wanted patient to receive several days of TNA before she consented to surgery.  Subsequently underwent laparotomy with lysis of adhesions on 10/15, postoperative course complicated by ileus and low-grade fever.  See below for further details.  Subjective: In bed, denies any headache chest or abdominal pain.   Assessment/Plan:  Small bowel obstruction: Underwent laparotomy with lysis of adhesions 05/10/2018 - postoperative course complicated by ileus and likely repeat obstruction as per CT scan done on 05/20/2018 with a left lower quadrant transition point, complicated by lack of activity and baseline wheelchair/bedbound status, no surgery monitoring and following will defer management of this problem to general surgery.    Currently being managed conservatively with bowel rest, demise narcotics and  benzodiazepines, encouraged to sit up in chair and increase activity, general surgery following. Surgery has clamped NG tube on 05/27/2018, he has been given MiraLAX along with Dulcolax suppositories since 05/28/2018 and has had about 4 bowel movements since then, NG remains clamped, no nausea or abdominal pain.  Unfortunately he is unable to swallow safely speech is following closely, for now continue TNA once cleared by speech will commence oral diet.   Low-grade fever on 10/19: No foci of infection apparent-could have been from atelectasis-not on any antimicrobial therapy.  Blood culture on 10/20 neg so far, chest x-ray on 10/20 neg for PNA.  Remains afebrile since then.  Follow.  Sepsis secondary to complicated UTI: Sepsis pathophysiology has resolved, urine culture positive for Klebsiella-has completed a course of IV Rocephin.  Blood cultures remain negative.    Aspiration pneumonia: This occurred in the early part of his hospitalization-felt to be secondary to o SBO/vomiting with underlying frailty.  Has completed a course of antimicrobial therapy.  Continue to mobilize as much as possible.    Acute metabolic encephalopathy: Improved and back to his usual baseline.  Encephalopathy was present on initial presentation-this was felt secondary to UTI, and developing SBO.Marland Kitchen  Anemia: Suspect anemia secondary to acute illness, and IV fluid dilution.  Has been transfused a total of 3 units so far, last transfusion on 10/20.  Follow CBC periodically.  No indication of any overt blood loss at this time.   AKI: Hemodynamically mediated, resolved.  Schizophrenia: Appears stable-continue IV Depakote and olanzapine.    Hypertension: Blood pressure appears stable-continue to hold antihypertensives.   Severe malnutrition: Continue TNA-we will start low-dose nutritional supplements when diet is more stable.  Deconditioning/debility: Suspect has significant amount of debility at baseline-he is very  cachectic-deconditioning/debility has worsened due to acute illness/SBO.  PT following.  Hyperkalemia - resolved after 1 dose of Lasix will monitor.  Other issues:  Patient's sister initially wanted guarantees with surgery, wanted transfer to [facility] (refused by [facility]).  See my prior notes.  I have reviewed notes by Dr. Marena Chancy issues with patient's sister refusing NG tube placement, wanting transfer to [facility] ([facility]used by [place] [facility] and [place]).  Per nursing staff-she has on occasion been very disruptive to their workflow and in the care of the patient.  I completely agree with Dr. Marena Chancy concerns whether patient's sister is the best person to be making decisions for this patient-given numerous documentations by nursing staff and by prior MDs.  Social work, risk Insurance account manager, Camera operator and palliative care following.   Please  see my note from 05/17/2018.   DVT Prophylaxis:  Prophylactic Heparin   Code Status: Full code   Family Communication:  Sister bedside on 05/22/2018, 05/24/18 with nursing staff present  Disposition Plan:  Remain inpatient-ultimately to SNF when he is more stable.  Antimicrobial agents: Anti-infectives (From admission, onward)   Start     Dose/Rate Route Frequency Ordered Stop   05/10/18 0941  ceFAZolin (ANCEF) 2-4 GM/100ML-% IVPB    Note to Pharmacy:  [name]   : cabinet override      05/10/18 0941 05/10/18 2144   05/01/18 1215  metroNIDAZOLE (FLAGYL) IVPB 500 mg  Status:  Discontinued     500 mg 100 mL/hr over 60 Minutes Intravenous Every 8 hours 05/01/18 1201 05/06/18 1153   04/30/18 1100  cefTRIAXone (ROCEPHIN) 2 g in sodium chloride 0.9 % 100 mL IVPB  Status:  Discontinued     2 g 200 mL/hr over 30 Minutes Intravenous Every 24 hours 04/29/18 1416 05/06/18 1153   04/29/18 1430  cefTRIAXone (ROCEPHIN) 1 g in sodium chloride 0.9 % 100 mL IVPB     1 g 200 mL/hr over 30 Minutes Intravenous Every 24 hours 04/29/18 1416  04/30/18 1429   04/29/18 1130  cefTRIAXone (ROCEPHIN) 1 g in sodium chloride 0.9 % 100 mL IVPB     1 g 200 mL/hr over 30 Minutes Intravenous  Once 04/29/18 1118 04/29/18 1251      Procedures: None  CONSULTS:  None  Time spent: 25 minutes   MEDICATIONS: Scheduled Meds: . benztropine  1 mg Oral BID  . bisacodyl  10 mg Rectal Daily  . Chlorhexidine Gluconate Cloth  6 each Topical 2 times per day on Tue  . heparin  5,000 Units Subcutaneous Q8H  . lip balm  1 application Topical BID  . OLANZapine zydis  10 mg Oral BID  . polyethylene glycol  17 g Oral Daily  . polyvinyl alcohol  1 drop Both Eyes TID   Continuous Infusions: . sodium chloride 10 mL/hr at 05/30/18 0800  . lactated ringers 10 mL/hr at 05/11/18 0617  . TPN ADULT (ION) 65 mL/hr at 05/30/18 0800  . TPN ADULT (ION)    . valproate sodium 250 mg (05/30/18 0948)   PRN Meds:.sodium chloride, acetaminophen, albuterol, alum & mag hydroxide-simeth, diphenhydrAMINE, magic mouthwash, menthol-cetylpyridinium, metoprolol tartrate, ondansetron (ZOFRAN) IV, phenol, sodium chloride flush, traMADol   PHYSICAL EXAM: Vital signs: Vitals:   05/28/18 2133 05/29/18 0629 05/29/18 1420 05/29/18 2045  BP: 100/66 98/74 97/68  100/78  Pulse: 85 87  (!) 104  Resp:   16 16  Temp: 99.8 F (37.7 C)  97.9 F (36.6 C) 98.2 F (36.8 C) 98 F (36.7 C)  TempSrc:   Oral Oral  SpO2: 98% 100% 99% 100%  Weight:      Height:       Filed Weights   04/29/18 0842  Weight: 49.9 kg   Body mass index is 17.75 kg/m.   Exam  Awake Alert, NG in palce [place].AT,PERRAL Supple Neck,No JVD, No cervical lymphadenopathy appriciated.  Symmetrical Chest wall movement, Good air movement bilaterally, CTAB RRR,No Gallops, Rubs or new Murmurs, No Parasternal Heave +ve B.Sounds, Abd Soft, No tenderness, No organomegaly appriciated, No rebound - guarding or rigidity. No Cyanosis, Clubbing or edema, No new Rash or bruise   I have personally reviewed following  labs and imaging studies  LABORATORY DATA: CBC: Recent Labs  Lab 05/24/18 0908 05/29/18 0331 05/30/18 0353  WBC 13.4* 8.5 9.7  NEUTROABS  --   --  6.5  HGB 10.2* 8.8* 9.9*  HCT 33.0* 30.8* 31.8*  MCV 93.8 96.0 93.8  PLT 524* 295 284    Basic Metabolic Panel: Recent Labs  Lab 05/24/18 0908  05/26/18 0445 05/27/18 0402 05/28/18 0443 05/29/18 0331 05/30/18 0353 05/30/18 1002  NA 136   < > 138 137 138 137  --  140  K 4.8   < > 5.0 4.5 4.6 4.3  --  4.0  CL 104   < > 109 107 110 110  --  111  CO2 24   < > 25 25 24 22   --  24  GLUCOSE 105*   < > 87 99 94 97  --  101*  BUN 41*   < > 69* 58* 49* 41*  --  42*  CREATININE 0.73   < > 0.87 0.80 0.79 0.74  --  0.64  CALCIUM 9.7   < > 9.3 9.3 9.3 9.1  --  9.3  MG 2.3  --   --  2.5* 2.0 1.7  --  1.9  PHOS  --   --  5.3* 4.1  --   --  3.9  --    < > = values in this interval not displayed.    GFR: Estimated Creatinine Clearance: 64.1 mL/min (by C-G formula based on SCr of 0.64 mg/dL).  Liver Function Tests: Recent Labs  Lab 05/30/18 1002  AST 11*  ALT 10  ALKPHOS 104  BILITOT 0.4  PROT 7.4  ALBUMIN 2.7*   No results for input(s): LIPASE, AMYLASE in the last 168 hours. No results for input(s): AMMONIA in the last 168 hours.  Coagulation Profile: No results for input(s): INR, PROTIME in the last 168 hours.  Cardiac Enzymes: No results for input(s): CKTOTAL, CKMB, CKMBINDEX, TROPONINI in the last 168 hours.  BNP (last 3 results) No results for input(s): PROBNP in the last 8760 hours.  HbA1C: No results for input(s): HGBA1C in the last 72 hours.  CBG: Recent Labs  Lab 05/29/18 0028 05/29/18 0825 05/29/18 1746 05/30/18 0127 05/30/18 0813  GLUCAP 100* 73 100* 96 91    Lipid Profile: Recent Labs    05/30/18 0353  TRIG 30    Thyroid Function Tests: No results for input(s): TSH, T4TOTAL, FREET4, T3FREE, THYROIDAB in the last 72 hours.  Anemia Panel: No results for input(s): VITAMINB12, FOLATE,  FERRITIN, TIBC, IRON, RETICCTPCT in the last 72 hours.  Urine analysis:    Component Value Date/Time   COLORURINE YELLOW 05/15/2018 1109   APPEARANCEUR CLEAR 05/15/2018 1109   LABSPEC 1.013 05/15/2018 1109  PHURINE 7.0 05/15/2018 1109   GLUCOSEU NEGATIVE 05/15/2018 1109   HGBUR SMALL (A) 05/15/2018 1109   BILIRUBINUR NEGATIVE 05/15/2018 1109   KETONESUR NEGATIVE 05/15/2018 1109   PROTEINUR NEGATIVE 05/15/2018 1109   UROBILINOGEN 1.0 06/08/2010 1523   NITRITE NEGATIVE 05/15/2018 1109   LEUKOCYTESUR TRACE (A) 05/15/2018 1109    Sepsis Labs: Lactic Acid, Venous    Component Value Date/Time   LATICACIDVEN 1.07 04/29/2018 1334    MICROBIOLOGY: No results found for this or any previous visit (from the past 240 hour(s)).  RADIOLOGY STUDIES/RESULTS: Ct Abdomen Pelvis Wo Contrast  Result Date: 04/30/2018 CLINICAL DATA:  Nausea, bilious vomiting EXAM: CT ABDOMEN AND PELVIS WITHOUT CONTRAST TECHNIQUE: Multidetector CT imaging of the abdomen and pelvis was performed following the standard protocol without IV contrast. Sagittal and coronal MPR images reconstructed from axial data set. Oral contrast was not administered. COMPARISON:  None FINDINGS: Lower chest: Emphysematous changes with BILATERAL lower lobe infiltrates consistent with either pneumonia or aspiration. Hepatobiliary: Liver unremarkable. Gallbladder not well visualized due to streak artifacts, grossly unremarkable. Pancreas: Normal appearance Spleen: Normal appearance Adrenals/Urinary Tract: Question adrenal thickening bilaterally. No obvious renal mass or hydronephrosis. Ureters not visualized. Foley catheter decompresses urinary bladder. Stomach/Bowel: Nasogastric tube in stomach. Low-attenuation fluid distends stomach and multiple proximal small bowel loops, with small bowel loops up to 4.1 cm diameter. Distal small bowel loops and colon are decompressed. Scattered stool throughout colon. Findings likely represent mid small bowel  obstruction. Appendix not visualized. Suspected rectal wall thickening. Vascular/Lymphatic: Atherosclerotic calcification aorta. Aorta normal caliber. Reproductive: Minimal prostatic enlargement and scattered prostatic calcifications. Other: No definite free air or free fluid. Nonspecific stranding of presacral fat. Musculoskeletal: No acute osseous findings. IMPRESSION: Dilated proximal and decompressed distal small bowel loops compatible with small bowel obstruction, likely in the mid small bowel. Due to lack of fat planes, streak artifacts, and lack of contrast, unable to localize site of obstruction. No evidence of perforation/free air. Question rectal wall thickening, recommend correlation with proctoscopy. Emphysematous changes with bibasilar pulmonary infiltrates either representing pneumonia or aspiration. Electronically Signed   By: Ulyses Southward M.D.   On: 04/30/2018 19:38   Dg Abd 1 View  Result Date: 05/20/2018 CLINICAL DATA:  NG tube adjustment EXAM: ABDOMEN - 1 VIEW COMPARISON:  05/20/2018 FINDINGS: Esophageal tube tip and side-port project over gastric fundus, somewhat abrupt appearing curvature at the distal tip with persistent gaseous dilatation. Continued marked gaseous enlargement of bowel in the left abdomen up to 5 cm. Moderate stool in the colon. Residual contrast within the bladder. IMPRESSION: 1. Esophageal tube tip and side port overlie the gastric fundus, somewhat abrupt curvature of the tip, could be due to mild kinking. 2. Persistent dilatation of stomach and left abdominal small bowel consistent with a bowel obstruction. Electronically Signed   By: Jasmine Pang M.D.   On: 05/20/2018 18:37   Dg Abd 1 View  Result Date: 05/20/2018 CLINICAL DATA:  NG tube placement. EXAM: ABDOMEN - 1 VIEW COMPARISON:  CT abdomen pelvis from same day. FINDINGS: Tip of the NG tube in the stomach with the proximal side port at the gastroesophageal junction. Dilated loops of small bowel in the left  abdomen are unchanged. Contrast is seen within the renal collecting systems and bladder. No acute osseous abnormality. IMPRESSION: 1. NG tube tip in the stomach with the proximal side port at the gastroesophageal junction. Consider advancing 2-3 cm. 2. Unchanged small bowel obstruction. Electronically Signed   By: Vickki Hearing.D.  On: 05/20/2018 16:47   Dg Abd 1 View  Result Date: 05/16/2018 CLINICAL DATA:  Feeding tube advanced today. EXAM: ABDOMEN - 1 VIEW COMPARISON:  Plain films of the abdomen dated 05/15/2018 and 05/14/2018. FINDINGS: Enteric tube is coiled in the stomach with tip at the level of the gastric fundus/cardia. Distended gas-filled loops of large and small bowel are again appreciated throughout the abdomen and upper pelvis. IMPRESSION: 1. Feeding tube is coiled in the stomach with tip at the level of the gastric fundus/cardia. 2. Persistent gaseous distention of both large and small bowel loops throughout the abdomen and pelvis, compatible with previous reports of obstruction or ileus. Electronically Signed   By: Bary Richard M.D.   On: 05/16/2018 20:26   Dg Abd 1 View  Result Date: 05/03/2018 CLINICAL DATA:  Small bowel obstruction EXAM: ABDOMEN - 1 VIEW COMPARISON:  05/02/2018 FINDINGS: NG tube is in the stomach. Gaseous distention of the stomach and left abdominal small bowel loops. Gas and stool within nondistended colon. No free air or organomegaly. IMPRESSION: Gaseous distention of stomach and left abdominal small bowel loops likely reflecting small bowel obstruction. NG tube is in the stomach. Electronically Signed   By: Charlett Nose M.D.   On: 05/03/2018 10:05   Dg Abd 1 View  Result Date: 05/02/2018 CLINICAL DATA:  Small bowel obstruction EXAM: ABDOMEN - 1 VIEW COMPARISON:  May 01, 2018 abdominal radiograph and CT abdomen and pelvis April 30, 2018 FINDINGS: Nasogastric tube no longer present. There is slightly less bowel dilatation in the left upper quadrant compared  to 1 day prior. There is moderate air in the stomach. No bowel dilatation elsewhere. No air-fluid levels. No free air. There is moderate stool in the colon. There is atelectatic change in the right lung base. IMPRESSION: Nasogastric tube no longer appreciable. Less small bowel dilatation in the left upper quadrant. Question resolving bowel obstruction. No free air evident. Electronically Signed   By: Bretta Bang III M.D.   On: 05/02/2018 14:20   Ct Abdomen Pelvis W Contrast  Result Date: 05/20/2018 CLINICAL DATA:  Nausea and vomiting. 10 days status post laparoscopic lysis of adhesions and bowel resection. EXAM: CT ABDOMEN AND PELVIS WITH CONTRAST TECHNIQUE: Multidetector CT imaging of the abdomen and pelvis was performed using the standard protocol following bolus administration of intravenous contrast. CONTRAST:  ISOVUE-300 IOPAMIDOL (ISOVUE-300) INJECTION 61% COMPARISON:  04/30/2018 FINDINGS: Lower Chest: Decreased airspace disease seen in both lower lobes, likely due to resolving pneumonia. Hepatobiliary: A few tiny left hepatic lobe cysts are seen. No hepatic masses identified. Tiny calcified gallstones or layering sludge noted, however there is no evidence of cholecystitis or biliary ductal dilatation. Pancreas:  No mass or inflammatory changes. Spleen: Within normal limits in size and appearance. Adrenals/Urinary Tract: No masses identified. No evidence of hydronephrosis. Stomach/Bowel: Small amount of free air is seen, likely postoperative in etiology. No abscess identified. Multiple moderately dilated fluid-filled small bowel loops are seen in the left abdomen, with transition to nondilated small bowel in the left lower quadrant in area of surgical staples. This is consistent with a small-bowel obstruction. No mass or inflammatory process identified. Moderate amount of stool again seen in the right colon. Vascular/Lymphatic: No pathologically enlarged lymph nodes. No abdominal aortic  aneurysm. Reproductive: Mild-to-moderate enlargement of prostate gland shows no significant change. Other: Subcutaneous emphysema is seen in the left anterior abdominal wall, likely postoperative in etiology. Musculoskeletal:  No suspicious bone lesions identified. IMPRESSION: Small bowel obstruction, with transition  point in left lower quadrant. This may be due to adhesion, as no obstructing mass or inflammatory process identified. Small amount of postop intraperitoneal air and subcutaneous emphysema. No evidence of abscess. Cholelithiasis.  No radiographic evidence of cholecystitis. Decreased bilateral lower lobe airspace disease, consistent with resolving pneumonia or inflammatory process. Electronically Signed   By: Myles Rosenthal M.D.   On: 05/20/2018 13:41   Dg Chest Port 1 View  Result Date: 05/29/2018 CLINICAL DATA:  Order for SBO EXAM: PORTABLE CHEST 1 VIEW COMPARISON:  Radiograph 05/15/2018 FINDINGS: NG tube extends into the stomach. PICC line unchanged. Normal cardiac silhouette with ectatic aorta. Lungs are hyperinflated. Remote RIGHT rib fractures. IMPRESSION: No acute cardiopulmonary process. Electronically Signed   By: Genevive Bi M.D.   On: 05/29/2018 10:41   Dg Chest Port 1 View  Result Date: 05/15/2018 CLINICAL DATA:  Shortness of breath. Patient 5 days postop bowel resection with lysis of adhesions. EXAM: PORTABLE CHEST 1 VIEW COMPARISON:  05/11/2018 FINDINGS: Right-sided PICC line with tip over the SVC. Lungs are adequately inflated with subtle left retrocardiac opacification which may be due to vascular crowding or atelectasis and less likely infection. Remainder of the lungs are clear. Cardiomediastinal silhouette is within normal. Persistent evidence of free peritoneal air compatible with patient's recent surgery. IMPRESSION: Mild left base opacification likely atelectasis or vascular crowding and less likely infection. Persistent free peritoneal air compatible recent postop state.  Right-sided PICC line with tip over the SVC. Electronically Signed   By: Elberta Fortis M.D.   On: 05/15/2018 09:38   Dg Chest Port 1 View  Result Date: 05/11/2018 CLINICAL DATA:  Shortness of breath, hypertension, pneumonia, dementia, former smoker EXAM: PORTABLE CHEST 1 VIEW COMPARISON:  Portable exam 0945 hours compared to 05/09/2018 FINDINGS: Nasogastric tube extends into stomach. RIGHT arm PICC line tip projects over SVC. Normal heart size and pulmonary vascularity. Elongation of thoracic aorta. Lungs appear emphysematous but clear. No pleural effusion or pneumothorax. Bones demineralized. Free air identified under the hemidiaphragms bilaterally; EHR indicates patient underwent a laparoscopic procedure on 05/10/2018, small-bowel resection and lysis of adhesions for small-bowel obstruction prior operative note. IMPRESSION: COPD changes without infiltrate. Free air consistent with abdominal surgery 1 day ago. Electronically Signed   By: Ulyses Southward M.D.   On: 05/11/2018 10:21   Dg Chest Port 1 View  Result Date: 05/09/2018 CLINICAL DATA:  Cough EXAM: PORTABLE CHEST 1 VIEW COMPARISON:  05/04/2018 FINDINGS: NG tube is stable. Right upper extremity PICC placed. Tip is at the cavoatrial junction. Lungs remain hyperaerated and clear. Small right pleural effusion is stable. No pneumothorax. Aorta remains somewhat prominent due to tortuosity and rotation of the thorax. IMPRESSION: New right upper extremity PICC with its tip at the cavoatrial junction. No active cardiopulmonary disease. Electronically Signed   By: Jolaine Click M.D.   On: 05/09/2018 07:37   Dg Chest Port 1v Same Day  Result Date: 05/01/2018 CLINICAL DATA:  history of schizophrenia, hypertension, BPH-mostly wheelchair-bound-presented to the hospital with approximately 1 week history of intermittent vomiting, confusion for a few days prior to this hospital admission-presented with sepsis secondary to UTI, and significant constipation. C/o SOB  EXAM: PORTABLE CHEST - 1 VIEW SAME DAY COMPARISON:  04/29/2018 FINDINGS: Lungs are hyperinflated. Subsegmental atelectasis or early infiltrate at the left lung base, new since previous. Right lung clear. Heart size normal.  Tortuous ectatic thoracic aorta. No effusion. No pneumothorax. Right anterolateral fourth rib fracture, age indeterminate. Nasogastric tube extends to the stomach. IMPRESSION:  1. New patchy infiltrate or subsegmental atelectasis at the left lung base. 2. Right fourth rib fracture without pneumothorax. 3. Nasogastric tube placement to the stomach. Electronically Signed   By: Corlis Leak M.D.   On: 05/01/2018 08:24   Dg Abd 2 Views  Result Date: 05/05/2018 CLINICAL DATA:  Vomiting.  History of right lumpectomy. EXAM: ABDOMEN - 2 VIEW COMPARISON:  05/04/2018 FINDINGS: Improve dilation of small bowel loops with residual borderline gas-filled distended small bowel loops in the left upper quadrant of the abdomen. Normal colonic bowel gas pattern. No evidence of free intra-abdominal gas. Normal cardiac silhouette. Tortuosity of the thoracic aorta. Right pleural reflection versus small pleural effusion. No lobar airspace consolidation. Enteric catheter tip within the expected location the gastric body, side hole at the GE junction level. Right PICC line terminates in the expected location of the cavoatrial junction. IMPRESSION: Improved appearance of small-bowel obstruction. Enteric catheter with side hole at the level of the GE junction. Advancement may be considered. Possible small right pleural effusion. Electronically Signed   By: Ted Mcalpine M.D.   On: 05/05/2018 12:51   Dg Abd Acute W/chest  Result Date: 05/04/2018 CLINICAL DATA:  Follow-up small bowel obstruction. EXAM: DG ABDOMEN ACUTE W/ 1V CHEST COMPARISON:  Abdominal x-rays 05/04/1999 19 dating back to 04/29/2018. CT abdomen and pelvis 04/30/2018. Chest x-rays 05/01/2018, 04/12/2018. FINDINGS: Nasogastric tube tip in the fundus  of the stomach. Persistent marked gaseous distension of the jejunum in the LEFT UPPER QUADRANT, slowly progressive over the past 3 days, with air-fluid levels on the LATERAL decubitus image. No evidence of free intraperitoneal air. Normal caliber colon with moderate stool burden. Calcified uterine fibroid in the pelvic midline. Cardiac silhouette normal in size, unchanged. Thoracic aorta tortuous and atherosclerotic. Hilar and mediastinal contours otherwise unremarkable. Interval improvement in aeration in the LEFT lung base, with only minimal patchy opacities persisting. Lungs otherwise clear. Emphysematous changes in both lungs with scarring in the RIGHT mid lung, unchanged. Pleuroparenchymal scarring at the RIGHT lung base which accounts for the blunting of the costophrenic angle, unchanged dating back to the original 04/12/2018 examination. IMPRESSION: 1. Persistent partial small bowel obstruction which has slowly worsened over the past 3 days. 2. No evidence of free intraperitoneal air. 3. Improved aeration in the LEFT LOWER LOBE, with only mild atelectasis and/or pneumonia persisting. Electronically Signed   By: Hulan Saas M.D.   On: 05/04/2018 09:28   Dg Abd Portable 1v-small Bowel Obstruction Protocol-initial, 8 Hr Delay  Result Date: 05/29/2018 CLINICAL DATA:  8 hour delay small-bowel obstruction film. EXAM: PORTABLE ABDOMEN - 1 VIEW COMPARISON:  05/29/2018 FINDINGS: Contrast material demonstrated throughout the colon. No small or large bowel distention is identified. Enteric tube tip is in the left upper quadrant consistent with location in the upper stomach. Proximal side hole projects at or just above the expected location of the EG junction. No change in position. IMPRESSION: Contrast material throughout the colon. No evidence of bowel obstruction. Electronically Signed   By: Burman Nieves M.D.   On: 05/29/2018 23:44   Dg Abd Portable 1v  Result Date: 05/29/2018 CLINICAL DATA:  Small  bowel obstruction. EXAM: PORTABLE ABDOMEN - 1 VIEW COMPARISON:  One-view abdomen 05/28/2018 and 05/27/2018. FINDINGS: Bowel gas pattern is normal. No significant dilated loops of bowel remain. There is no free air. The side port of the NG tube is above the GE junction and could be advanced for more optimal positioning. The lung bases are clear. IMPRESSION: 1. Normal  bowel gas pattern. No residual dilated loops of small bowel. 2. The side port of the NG tube is above the GE junction. Electronically Signed   By: Marin Roberts M.D.   On: 05/29/2018 10:42   Dg Abd Portable 1v  Result Date: 05/28/2018 CLINICAL DATA:  Small-bowel obstruction EXAM: PORTABLE ABDOMEN - 1 VIEW COMPARISON:  Portable exam 0746 hours compared to 05/27/2018 FINDINGS: Scattered gas and minimal stool in colon. Few air-filled loops of small bowel in the LEFT mid abdomen, decreased in prominence since previous exam. Single loop demonstrates questionable mild wall bowel wall thickening. Tip of nasogastric tube projects over mid stomach. Bones appear demineralized. IMPRESSION: Decreased small bowel distention since prior study. Electronically Signed   By: Ulyses Southward M.D.   On: 05/28/2018 08:16   Dg Abd Portable 1v  Result Date: 05/27/2018 CLINICAL DATA:  Small bowel obstruction EXAM: PORTABLE ABDOMEN - 1 VIEW COMPARISON:  05/24/2018 FINDINGS: There is a nasogastric tube with the tip projecting over the stomach. There is mild gaseous distension of the small bowel and colon without evidence of obstruction. There is no evidence of pneumoperitoneum, portal venous gas or pneumatosis. There are no pathologic calcifications along the expected course of the ureters. The osseous structures are unremarkable. IMPRESSION: There is a nasogastric tube with the tip projecting over the stomach. There is mild gaseous distension of the small bowel and colon without evidence of obstruction. Electronically Signed   By: Elige Ko   On: 05/27/2018 09:44     Dg Abd Portable 1v  Result Date: 05/24/2018 CLINICAL DATA:  Follow-up small bowel obstruction. EXAM: PORTABLE ABDOMEN - 1 VIEW COMPARISON:  Portable supine abdominal radiograph of May 21, 2018 FINDINGS: The colonic stool burden is moderate. There are loops of mildly distended gas-filled small bowel to the left of midline. There is gas and stool in the rectum. There is no free extraluminal gas. The esophagogastric tube tip in proximal port project below the GE junction. The bony structures exhibit no acute abnormality. IMPRESSION: There are a few loops of mildly distended gas-filled small bowel to the left of midline. The colonic stool burden and gas pattern is within the limits of normal. There is no evidence of perforation. Electronically Signed   By: Cortlandt  [place] M.D.   On: 05/24/2018 09:25   Dg Abd Portable 1v-small Bowel Obstruction Protocol-initial, 8 Hr Delay  Result Date: 05/21/2018 CLINICAL DATA:  Small-bowel obstruction.  Follow-up exam. EXAM: PORTABLE ABDOMEN - 1 VIEW COMPARISON:  05/20/2018 FINDINGS: Significant improvement in small bowel dilation is noted since the prior study. The small bowel obstruction has essentially resolved radiographically. Mild generalized increased stool burden in the colon, unchanged from the previous exam. Midline abdominal wall surgical staples are stable. Nasogastric tube is well positioned, tip in the mid stomach. IMPRESSION: 1. Significant interval improvement from the previous day's exam. Small-bowel obstruction appears resolved radiographically. Electronically Signed   By: Amie Portland M.D.   On: 05/21/2018 13:12   Dg Abd Portable 1v  Result Date: 05/17/2018 CLINICAL DATA:  Small-bowel obstruction. EXAM: PORTABLE ABDOMEN - 1 VIEW COMPARISON:  05/16/2018.  05/15/2018.  CT 04/30/2018. FINDINGS: Surgical staples noted over the abdomen. NG tube noted with its tip in the stomach. Distended loops of small and large bowel are again noted. Similar findings  noted on prior exam. No free air. Degenerative change in osteopenia lumbar spine and both hips. Prostate calcifications are noted. Aortoiliac atherosclerotic vascular calcification. Diffuse osteopenia degenerative changes lumbar spine and both hips. Mild  blunting of the right costophrenic angle again noted suggesting scarring and/or tiny effusion. IMPRESSION: NG tube noted with its tip in the stomach. Dilated loops of small and large bowel are again noted without significant interim change. No free air identified. Electronically Signed   By: Maisie Fus  Register   On: 05/17/2018 07:32   Dg Abd Portable 1v  Result Date: 05/15/2018 CLINICAL DATA:  NG tube placement EXAM: PORTABLE ABDOMEN - 1 VIEW COMPARISON:  05/15/2018 FINDINGS: NG tube is in placed into the fundus of the stomach. Continued gaseous distention of visualized upper abdominal bowel loops. IMPRESSION: NG tube in the fundus of the stomach Electronically Signed   By: Charlett Nose M.D.   On: 05/15/2018 20:11   Dg Abd Portable 1v  Result Date: 05/15/2018 CLINICAL DATA:  Small bowel obstruction. EXAM: PORTABLE ABDOMEN - 1 VIEW COMPARISON:  One-view abdomen 05/14/2018 FINDINGS: Patient's retained left.  Heart size normal.  Lung bases are clear. Extend loops of large and small bowel are stable. There is no free air or pneumatosis. Surgical clips are noted. IMPRESSION: 1. Similar appearance of distended large and small bowel consistent with obstruction or ileus. Electronically Signed   By: Marin Roberts M.D.   On: 05/15/2018 08:10   Dg Abd Portable 1v  Result Date: 05/14/2018 CLINICAL DATA:  Abdominal pain EXAM: PORTABLE ABDOMEN - 1 VIEW COMPARISON:  05/13/2018 FINDINGS: Interval NG tube removal. Some interval improvement in gaseous distention of the bowel. Stool and air throughout the transverse colon. Postop staples again noted. Air in the rectum. No significant interval change. Degenerative changes of the spine. Basilar chronic interstitial  changes and atelectasis. IMPRESSION: NG tube removed. Stable nonspecific bowel gas pattern, suspect resolving obstruction or ileus. Little interval change. Electronically Signed   By: Judie Petit.  Shick M.D.   On: 05/14/2018 10:23   Dg Abd Portable 1v  Result Date: 05/13/2018 CLINICAL DATA:  Abdominal pain, check NG placement EXAM: PORTABLE ABDOMEN - 1 VIEW COMPARISON:  05/09/2018 FINDINGS: Nasogastric catheter is noted with the tip in the stomach. Proximal side port lies in the distal esophagus. This is stable from the prior exam. Scattered large and small bowel gas is noted. Postsurgical changes are now seen. Mild small bowel dilatation is noted which may be related to a postoperative ileus. IMPRESSION: Mild postoperative ileus. Nasogastric catheter as described. Electronically Signed   By: Alcide Clever M.D.   On: 05/13/2018 09:56   Dg Abd Portable 1v  Result Date: 05/09/2018 CLINICAL DATA:  Small-bowel obstruction EXAM: PORTABLE ABDOMEN - 1 VIEW COMPARISON:  05/08/2018; 05/06/2018; 05/05/2018 FINDINGS: Re-demonstrated gaseous distention of several loops of small bowel with index loop of small bowel within the left mid hemiabdomen measuring 3.6 cm in diameter. Moderate colonic stool burden. No supine evidence of pneumoperitoneum. No pneumatosis or portal venous gas. Presumed calcified fibroid overlies the left hemipelvis. Stable position of support apparatus. No acute osseus abnormalities. IMPRESSION: No change to slight improvement in suspected small-bowel obstruction. Electronically Signed   By: Simonne Come M.D.   On: 05/09/2018 07:47   Dg Abd Portable 1v  Result Date: 05/08/2018 CLINICAL DATA:  Follow up small bowel obstruction EXAM: PORTABLE ABDOMEN - 1 VIEW COMPARISON:  05/06/2018 FINDINGS: Scattered large and small bowel gas is noted. Multiple dilated loops of small bowel are again identified and stable in appearance. Nasogastric catheter is again noted within the stomach. No free air is seen.  Degenerative changes of the lumbar spine are noted. IMPRESSION: Persistent small bowel dilatation. No new focal  abnormality is noted. Electronically Signed   By: Alcide Clever M.D.   On: 05/08/2018 08:36   Dg Abd Portable 1v  Result Date: 05/06/2018 CLINICAL DATA:  Small bowel obstruction EXAM: PORTABLE ABDOMEN - 1 VIEW COMPARISON:  Portable exam at 0628 hours compared to 05/05/2018 FINDINGS: Stool scattered throughout colon. Persistent dilatation of small bowel loops in the LEFT mid abdomen up to 4.9 cm diameter. No bowel wall thickening. Bones demineralized. No definite renal calcifications. IMPRESSION: Persistent small bowel dilatation consistent with small bowel obstruction. Little interval change. Electronically Signed   By: Ulyses Southward M.D.   On: 05/06/2018 10:21   Dg Abd Portable 1v-small Bowel Obstruction Protocol-initial, 8 Hr Delay  Result Date: 05/04/2018 CLINICAL DATA:  Small bowel obstruction protocol. 8 hour delay. EXAM: PORTABLE ABDOMEN - 1 VIEW COMPARISON:  05/03/2018 FINDINGS: Gaseous distention of left upper quadrant small bowel. Enteric tube with tip projected over the mid abdomen consistent with location in the upper stomach. Contrast material is demonstrated in the dilated small bowel. No contrast material is identified in the colon. This is suggestive of high-grade obstruction. Degenerative changes in the spine. IMPRESSION: Contrast material is demonstrated in the dilated small bowel but not in the colon consistent with high-grade obstruction. Electronically Signed   By: Burman Nieves M.D.   On: 05/04/2018 01:55   Dg Abd Portable 1v-small Bowel Obstruction Protocol-initial, 8 Hr Delay  Result Date: 05/01/2018 CLINICAL DATA:  Small bowel obstruction. EXAM: PORTABLE ABDOMEN - 1 VIEW COMPARISON:  Radiograph earlier this day at 1109 hour, CT yesterday. FINDINGS: Enteric tube in place with tip in the stomach, side-port just beyond the gastroesophageal junction. Improving small bowel  dilatation in the left abdomen. Moderate stool in the right and transverse colon. No evidence of free air. Pelvic calcification may be stone in the urinary bladder or exophytic prostate calcification. IMPRESSION: Improving small bowel dilatation in the left abdomen since radiograph earlier this day. Electronically Signed   By: Narda Rutherford M.D.   On: 05/01/2018 21:14   Dg Abd Portable 1v-small Bowel Protocol-position Verification  Result Date: 05/01/2018 CLINICAL DATA:  Nasogastric tube placement EXAM: PORTABLE ABDOMEN - 1 VIEW COMPARISON:  Portable exam 1109 hours compared to 04/30/2018 FINDINGS: Tip of nasogastric tube projects over mid stomach. Dilated small bowel loops are seen in the LEFT mid abdomen. Increased stool in RIGHT colon. Lung bases appear emphysematous with subsegmental atelectasis at LEFT base. Bones demineralized. IMPRESSION: Tip of nasogastric tube projects over mid stomach. Small bowel dilatation in LEFT mid abdomen with increased stool in RIGHT colon. Electronically Signed   By: Ulyses Southward M.D.   On: 05/01/2018 11:22   [place] Ekg Site Rite  Result Date: 05/04/2018 If Site Rite image not attached, placement could not be confirmed due to current cardiac rhythm.    LOS: 31 days   Signature  Susa Raring M.D on 05/30/2018 at 11:12 AM   -  To page go to [email or web address] - password [facility]

## 2018-05-30 NOTE — Progress Notes (Signed)
Occupational Therapy Treatment Patient Details Name: Jeffrey Davila MRN: [id number] DOB: 1951/01/21 Today's Date: 05/30/2018    History of present illness Pt is a 67 y/o male admitted secondary to AMS and Sepsis most likely secondary to UTI. CT negative for acute abnormality. Work up during hospitalizations shows, aspiration PNA, SBO, acute metabolic encephalopathy, anemia, AKI, severe malnutrition, deconditioning/debility, and hyponatremia.  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  PMH inlcudes BI, dementia, HTN, and schizophrenia.    OT comments  Pt progressing slowly towards established OT goals. Upon arrival, pt with bowel incontinence in bed. Pt requiring Max A for bed mobility and Max A +2 for toilet hygiene. Pt performing stand pivot transfer to recliner with Mod A +2. Pt requiring Max cues for participation in ADLs. Continue to recommend dc to SNF for further OT and will continue to follow acutely as admitted.    Follow Up Recommendations  SNF;Supervision/Assistance - 24 hour    Equipment Recommendations  Other (comment)(TBD in next venue)    Recommendations for Other Services PT consult;Speech consult    Precautions / Restrictions Precautions Precautions: Fall Precaution Comments: NG tube Restrictions Weight Bearing Restrictions: No       Mobility Bed Mobility Overal bed mobility: Needs Assistance Bed Mobility: Supine to Sit     Supine to sit: Max assist     General bed mobility comments: Max A to bring BLEs towards EOB and then elevate trunk.  Transfers Overall transfer level: Needs assistance Equipment used: 2 person hand held assist Transfers: Sit to/from [employer] Sit to Stand: Min assist;+2 safety/equipment Stand pivot transfers: Mod assist;+2 safety/equipment       General transfer comment: Min A to power up into standing and then Mod A to pivot towards recliner after peri  care    Balance Overall balance assessment: Needs assistance Sitting-balance support: No upper extremity supported;Feet supported Sitting balance-Leahy Scale: Fair Sitting balance - Comments: guard assist for safety at EOB   Standing balance support: During functional activity Standing balance-Leahy Scale: Poor Standing balance comment: posterior lean needing external assist                           ADL either performed or assessed with clinical judgement   ADL Overall ADL's : Needs assistance/impaired                 Upper Body Dressing : Maximal assistance;Sitting Upper Body Dressing Details (indicate cue type and reason): While sitting at EOB, Max A to doff soiled gown and don new gown. Pt requiring Max cues for particiapation. Lower Body Dressing: Bed level;Total assistance Lower Body Dressing Details (indicate cue type and reason): Total A to don socks while in bed Toilet Transfer: +2 for safety/equipment;Stand-pivot;Moderate assistance(Simulated to recliner) Toilet Transfer Details (indicate cue type and reason): Min A to power up into standing and then Mod A for pivot to recliner.  Toileting- Clothing Manipulation and Hygiene: Maximal assistance;+2 for physical assistance;Sit to/from stand Toileting - Clothing Manipulation Details (indicate cue type and reason): Requiring +2. Max A for peri care with Min A for standing balance     Functional mobility during ADLs: Moderate assistance;+2 for safety/equipment(stand pivot only) General ADL Comments: Pt with bowel incontenience in bed upon arrival. Pt requiring Max A for toilet hygiene and Mod A +2 to stand pivot to recliner. Pt repeating "I dodo myself" to inform OT of incontience in bed.  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        Exercises     Shoulder Instructions       General Comments RN was present throughout session    Pertinent Vitals/ Pain       Pain Assessment: Faces Faces Pain Scale: Hurts whole lot Pain Location: "all over" Pain Descriptors / Indicators: Grimacing;Other (Comment)(yelling out) Pain Intervention(s): Monitored during session;Limited activity within patient's tolerance;Repositioned  Home Living                                          Prior Functioning/Environment              Frequency  Min 2X/week        Progress Toward Goals  OT Goals(current goals can now be found in the care plan section)  Progress towards OT goals: Progressing toward goals  Acute Rehab OT Goals Patient Stated Goal: Sister "to go to rehab and then home." OT Goal Formulation: Patient unable to participate in goal setting Time For Goal Achievement: 05/18/18 Potential to Achieve Goals: Fair ADL Goals Pt Will Perform Upper Body Bathing: with set-up;with supervision Pt Will Perform Lower Body Bathing: with min assist Pt Will Perform Upper Body Dressing: with supervision Pt Will Perform Lower Body Dressing: with min assist Pt Will Transfer to Toilet: with min guard assist;bedside commode;stand pivot transfer  Plan Discharge plan remains appropriate    Co-evaluation                 AM-PAC PT "6 Clicks" Daily Activity     Outcome Measure   Help from another person eating meals?: Total Help from another person taking care of personal grooming?: A Little Help from another person toileting, which includes using toliet, bedpan, or urinal?: A Lot Help from another person bathing (including washing, rinsing, drying)?: A Lot Help from another person to put on and taking off regular upper body clothing?: A Lot Help from another person to put on and taking off  regular lower body clothing?: A Lot 6 Click Score: 12    End of Session Equipment Utilized During Treatment: Gait belt  OT Visit Diagnosis: Muscle weakness (generalized) (M62.81);Other symptoms and signs involving cognitive function   Activity Tolerance Patient tolerated treatment well   Patient Left with call bell/phone within reach;in chair;with chair alarm set;with nursing/sitter in room   Nurse Communication Mobility status        Time: 1610-9604 OT Time Calculation (min): 20 min  Charges: OT General Charges $OT Visit: 1 Visit OT Treatments $Self Care/Home Management : 8-22 mins  Shontavia Mickel MSOT, OTR/L Acute Rehab Pager: [phone] Office: [phone]   Theodoro Grist Zaydenn Balaguer 05/30/2018, 5:04 PM
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
## 2018-05-30 NOTE — Progress Notes (Addendum)
CSW continuing to follow patient and updated APS worker on current condition.   CSW faxed requested clinicals to APS worker, Ms. Kyla Balzarine [phone]).   [name] Truda Staub LCSW [phone]

## 2018-05-31 LAB — BASIC METABOLIC PANEL
ANION GAP: 4 — AB (ref 5–15)
BUN: 41 mg/dL — ABNORMAL HIGH (ref 8–23)
CALCIUM: 9 mg/dL (ref 8.9–10.3)
CO2: 24 mmol/L (ref 22–32)
Chloride: 111 mmol/L (ref 98–111)
Creatinine, Ser: 0.66 mg/dL (ref 0.61–1.24)
GFR calc Af Amer: >60 mL/min (ref >60)
GFR calc non Af Amer: >60 mL/min (ref >60)
GLUCOSE: 90 mg/dL (ref 70–99)
Potassium: 4 mmol/L (ref 3.5–5.1)
Sodium: 139 mmol/L (ref 135–145)

## 2018-05-31 LAB — MAGNESIUM: Magnesium: 2 mg/dL (ref 1.7–2.4)

## 2018-05-31 LAB — GLUCOSE, CAPILLARY
GLUCOSE-CAPILLARY: 88 mg/dL (ref 70–99)
Glucose-Capillary: 78 mg/dL (ref 70–99)
Glucose-Capillary: 87 mg/dL (ref 70–99)

## 2018-05-31 MED ORDER — BOOST PLUS PO LIQD
237.0000 mL | Freq: Three times a day (TID) | ORAL | Status: DC
  Administered 2018-05-31 – 2018-06-03 (×8): 237 mL via ORAL
  Filled 2018-05-31 (×12): qty 237

## 2018-05-31 MED ORDER — PRO-STAT SUGAR FREE PO LIQD
30.0000 mL | Freq: Three times a day (TID) | ORAL | Status: DC
  Administered 2018-05-31 – 2018-06-03 (×8): 30 mL via ORAL
  Filled 2018-05-31 (×7): qty 30

## 2018-05-31 MED ORDER — TRAVASOL 10 % IV SOLN
INTRAVENOUS | Status: AC
  Administered 2018-05-31: 17:00:00 via INTRAVENOUS
  Filled 2018-05-31: qty 990

## 2018-05-31 NOTE — Progress Notes (Signed)
[facility] Progress Note  21 Days Post-Op  Subjective: CC-  NG tube removed yesterday. Aspiration risk so he remains NPO on TPN. Continues to have bowel function.  Objective: Vital signs in last 24 hours: Temp:  [97.5 F (36.4 C)-98.8 F (37.1 C)] 98.8 F (37.1 C) (11/05 0505) Pulse Rate:  [79-88] 81 (11/05 0505) Resp:  [16-18] 16 (11/05 0505) BP: (91-110)/(65-69) 99/66 (11/05 0505) SpO2:  [100 %] 100 % (11/05 0505) Weight:  [46.4 kg] 46.4 kg (11/04 1129) Last BM Date: 05/29/18  Intake/Output from previous day: 11/04 0701 - 11/05 0700 In: 922.8 [I.V.:817.8; IV Piggyback:105] Out: 500 [Urine:500] Intake/Output this shift: No intake/output data recorded.  PE: Gen:  Alert, NAD, nonverbal Pulm:  effort normal Abd: Soft, NT/ND, +BS, no HSM, incisions cdi with steri strips in place Skin: no rashes noted, warm and dry  Lab Results:  Recent Labs    05/29/18 0331 05/30/18 0353  WBC 8.5 9.7  HGB 8.8* 9.9*  HCT 30.8* 31.8*  PLT 295 284   BMET Recent Labs    05/30/18 1002 05/31/18 0317  NA 140 139  K 4.0 4.0  CL 111 111  CO2 24 24  GLUCOSE 101* 90  BUN 42* 41*  CREATININE 0.64 0.66  CALCIUM 9.3 9.0   PT/INR No results for input(s): LABPROT, INR in the last 72 hours. CMP     Component Value Date/Time   NA 139 05/31/2018 0317   K 4.0 05/31/2018 0317   CL 111 05/31/2018 0317   CO2 24 05/31/2018 0317   GLUCOSE 90 05/31/2018 0317   BUN 41 (H) 05/31/2018 0317   CREATININE 0.66 05/31/2018 0317   CALCIUM 9.0 05/31/2018 0317   PROT 7.4 05/30/2018 1002   ALBUMIN 2.7 (L) 05/30/2018 1002   AST 11 (L) 05/30/2018 1002   ALT 10 05/30/2018 1002   ALKPHOS 104 05/30/2018 1002   BILITOT 0.4 05/30/2018 1002   GFRNONAA >60 05/31/2018 0317   GFRAA >60 05/31/2018 0317   Lipase     Component Value Date/Time   LIPASE 204 (H) 04/29/2018 0904       Studies/Results: Dg Chest Port 1 View  Result Date: 05/29/2018 CLINICAL DATA:  Order for SBO EXAM:  PORTABLE CHEST 1 VIEW COMPARISON:  Radiograph 05/15/2018 FINDINGS: NG tube extends into the stomach. PICC line unchanged. Normal cardiac silhouette with ectatic aorta. Lungs are hyperinflated. Remote RIGHT rib fractures. IMPRESSION: No acute cardiopulmonary process. Electronically Signed   By: Genevive Bi M.D.   On: 05/29/2018 10:41   Dg Abd Portable 1v-small Bowel Obstruction Protocol-initial, 8 Hr Delay  Result Date: 05/29/2018 CLINICAL DATA:  8 hour delay small-bowel obstruction film. EXAM: PORTABLE ABDOMEN - 1 VIEW COMPARISON:  05/29/2018 FINDINGS: Contrast material demonstrated throughout the colon. No small or large bowel distention is identified. Enteric tube tip is in the left upper quadrant consistent with location in the upper stomach. Proximal side hole projects at or just above the expected location of the EG junction. No change in position. IMPRESSION: Contrast material throughout the colon. No evidence of bowel obstruction. Electronically Signed   By: Burman Nieves M.D.   On: 05/29/2018 23:44   Dg Abd Portable 1v  Result Date: 05/29/2018 CLINICAL DATA:  Small bowel obstruction. EXAM: PORTABLE ABDOMEN - 1 VIEW COMPARISON:  One-view abdomen 05/28/2018 and 05/27/2018. FINDINGS: Bowel gas pattern is normal. No significant dilated loops of bowel remain. There is no free air. The side port of the NG tube is above the GE junction  and could be advanced for more optimal positioning. The lung bases are clear. IMPRESSION: 1. Normal bowel gas pattern. No residual dilated loops of small bowel. 2. The side port of the NG tube is above the GE junction. Electronically Signed   By: Marin Roberts M.D.   On: 05/29/2018 10:42    Anti-infectives: Anti-infectives (From admission, onward)   Start     Dose/Rate Route Frequency Ordered Stop   05/10/18 0941  ceFAZolin (ANCEF) 2-4 GM/100ML-% IVPB    Note to Pharmacy:  [name]   : cabinet override      05/10/18 0941 05/10/18 2144   05/01/18  1215  metroNIDAZOLE (FLAGYL) IVPB 500 mg  Status:  Discontinued     500 mg 100 mL/hr over 60 Minutes Intravenous Every 8 hours 05/01/18 1201 05/06/18 1153   04/30/18 1100  cefTRIAXone (ROCEPHIN) 2 g in sodium chloride 0.9 % 100 mL IVPB  Status:  Discontinued     2 g 200 mL/hr over 30 Minutes Intravenous Every 24 hours 04/29/18 1416 05/06/18 1153   04/29/18 1430  cefTRIAXone (ROCEPHIN) 1 g in sodium chloride 0.9 % 100 mL IVPB     1 g 200 mL/hr over 30 Minutes Intravenous Every 24 hours 04/29/18 1416 04/30/18 1429   04/29/18 1130  cefTRIAXone (ROCEPHIN) 1 g in sodium chloride 0.9 % 100 mL IVPB     1 g 200 mL/hr over 30 Minutes Intravenous  Once 04/29/18 1118 04/29/18 1251       Assessment/Plan Hx of prior lung surgery Acute metabolic encephalopathy Anemia- transfused 10/14 - hg stable (11/4) AKI- resolved Hypertension- controlled Hypokalemia/Hyponatremia- resolved Hx of schizophrenia- cogentin/Zyprexa - very difficult to get answers from him Deconditioning - bedbound Malnutrition - severe- TNA. 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 If patient cannot advance diet recommend placing Cortrak and initiating tube feeds as enteral nutrition would be better than TPN. Continue bowel regimen with daily miralax and dulcolax suppository.   ID -rocephin 10/4>>10/11, flagyl 10/6>>10/11 FEN - TPN, CLD when cleared by SLP VTE -SCDs, SQ heparin Foley -condom cath   LOS: 32 days    Franne Forts , [facility] 05/31/2018, 7:46 AM Pager: [phone] Mon 7:00 am -11:30 AM Tues-Fri 7:00 am-4:30 pm Sat-Sun 7:00 am-11:30 am

## 2018-05-31 NOTE — Progress Notes (Signed)
  Speech Language Pathology Treatment: Dysphagia  Patient Details Name: Jeffrey Davila MRN: [id number] DOB: 11/05/1950 Today's Date: 05/31/2018 Time: 2440-1027 SLP Time Calculation (min) (ACUTE ONLY): 15 min  Assessment / Plan / Recommendation Clinical Impression  Pt continues to exhibit a cognitively-based dysphagia characterized by reduced oral awareness for bolus acceptance, oral holding, and suspected premature spillage resulting in intermittent cough/wet vocal quality. Pt needs Mod-Max cues to take in and initiate oral transit with small amounts of ice chips and sips of water. He makes no attempts to get water from the edge of a cup, but does get limited amounts by spoon or straw. Mild improvements include less pooling of oral secretions and less oral expectoration of boluses. Risk for aspiration persists given mentation and signs concerning for aspiration noted clinically. Suspect that his mentation may fluctuate throughout the day as well, likely making it challenging to support himself nutritionally. For today, recommend allowing ice chips with full staff supervision, to be given when fully alert and accepting, and after oral care is completed. Will continue to follow with diagnostic PO trials to see if automaticity of swallow improves and signs of suspected aspiration are reduced. Discussed plan with MD.    HPI HPI: Pt is a 67 y/o male admitted secondary to AMS and Sepsis most likely secondary to UTI. CT negative for acute abnormality. Work up during hospitalizations showed aspiration PNA, SBO, acute metabolic encephalopathy, anemia, AKI, severe malnutrition, deconditioning/debility, and hyponatremia.  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 Pt has had NGT in/out throughout admission, at time of SLP swallow eval it was in place but clamped. PMH inlcudes BI, dementia, HTN, and schizophrenia.       SLP Plan  Continue with current  plan of care       Recommendations  Diet recommendations: NPO;Other(comment)(ice chips after oral care if alert and accepting) Medication Administration: Via alternative means                Oral Care Recommendations: Oral care QID Follow up Recommendations: Skilled Nursing facility SLP Visit Diagnosis: Dysphagia, unspecified (R13.10) Plan: Continue with current plan of care       GO                Maxcine Ham 05/31/2018, 9:21 AM  Maxcine Ham, M.A. CCC-SLP Acute Herbalist [phone] Office [phone]

## 2018-05-31 NOTE — Progress Notes (Addendum)
  Speech Language Pathology Treatment: Dysphagia  Patient Details Name: Jeffrey Davila MRN: [id number] DOB: July 09, 1951 Today's Date: 05/31/2018 Time: 4098-1191 SLP Time Calculation (min) (ACUTE ONLY): 12 min  Assessment / Plan / Recommendation Clinical Impression  Pt was seen again per MD request and with MD present. Together we provided trials of thin liquids and purees. Pt in this moment had less oral holding, needing Min-Mod cues for oral transit, clearance of oral residue, and more effortful volitional coughing as a swallowing precaution. Discussed with MD the concern for fluctuating mentation resulting in fluctuating aspiration risk, as evidenced by varying performance even this morning. Per MD, he believes the risk of not getting PO nutrition outweighs the risk of aspiration. He would like for PO diet to be started with emphasis on careful assistance during meals and use of swallowing precautions. If starting a diet, would opt for no more advanced than Dys 1 diet and thin liquids (per surgery, cleared for clear liquid diet at this time). Full supervision would be important for careful monitoring particularly for oral clearance. Will continue to follow closely.   HPI HPI: Pt is a 67 y/o male admitted secondary to AMS and Sepsis most likely secondary to UTI. CT negative for acute abnormality. Work up during hospitalizations showed aspiration PNA, SBO, acute metabolic encephalopathy, anemia, AKI, severe malnutrition, deconditioning/debility, and hyponatremia.  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 Pt has had NGT in/out throughout admission, at time of SLP swallow eval it was in place but clamped. PMH inlcudes BI, dementia, HTN, and schizophrenia.       SLP Plan  Goals updated       Recommendations  Diet recommendations: Dysphagia 1 (puree);Thin liquid Liquids provided via: Straw;Teaspoon Medication Administration: Crushed with  puree Supervision: Staff to assist with self feeding;Full supervision/cueing for compensatory strategies Compensations: Minimize environmental distractions;Slow rate;Small sips/bites;Lingual sweep for clearance of pocketing;Follow solids with liquid Postural Changes and/or Swallow Maneuvers: Seated upright 90 degrees;Upright 30-60 min after meal                Oral Care Recommendations: Oral care QID Follow up Recommendations: Skilled Nursing facility SLP Visit Diagnosis: Dysphagia, unspecified (R13.10) Plan: Goals updated       GO                Maxcine Ham 05/31/2018, 9:46 AM  Maxcine Ham, M.A. CCC-SLP Acute Herbalist [phone] Office [phone]
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
## 2018-05-31 NOTE — Progress Notes (Signed)
PROGRESS NOTE        PATIENT DETAILS Name: Jeffrey Davila Age: 67 y.o. Sex: male Date of Birth: 1951/03/15 Admit Date: 04/29/2018 Admitting Physician Starleen Arms, MD PCP:Uba, Reuel Boom, MD  Brief Narrative: Patient is a 67 y.o. male with history of schizophrenia/?  Cognitive dysfunction (dementia), hypertension, BPH, remote history (in the 1990s) of trauma causing small bowel injury requiring laparotomy-mostly wheelchair-bound-presented to the hospital with approximately 1 week history of intermittent vomiting, confusion for a few days prior to this hospital admission-presented with sepsis secondary to UTI, vomiting and significant constipation.  Patient was started on IV antibiotics and admitted to the hospital service,initial imaging was suggestive of constipation causing gastric distention-NG tube was placed, however upon further evaluation with a CT scan he was found  to have a small bowel obstruction.  General surgery was consulted, even with n.p.o. status/NG tube decompression-he continues to have SBO-patient's sister has refused to sign consent for surgery and wanted patient to receive several days of TNA before she consented to surgery.  Subsequently underwent laparotomy with lysis of adhesions on 10/15, postoperative course complicated by ileus which seems to finally have resolved on 05/30/2018.  See below for details.   Subjective: Sitting up in chair, denying any headache chest or abdominal pain, says he wants to eat some chicken.   Assessment/Plan:  Small bowel obstruction: Underwent laparotomy with lysis of adhesions 05/10/2018 - postoperative course complicated by ileus and likely repeat obstruction as per CT scan done on 05/20/2018 with a left lower quadrant transition point, complicated by lack of activity and baseline wheelchair/bedbound status, no surgery monitoring and following will defer management of this problem to general surgery.     Currently being managed conservatively with bowel rest, demise narcotics and benzodiazepines, encouraged to sit up in chair and increase activity, general surgery following. Surgery has clamped NG tube on 05/27/2018, he has been given MiraLAX along with Dulcolax suppositories since 05/28/2018 and has had about 5 bowel movements since then, discussed with general surgery NG tube was removed on 05/30/2018.  He has been seen by speech therapy on 05/31/2018, currently cleared for clear liquid diet, will place him on boost and pro-stat 3 times daily, for now IV TNA has been continued.  If oral intake is better IV TNA and central line can be discontinued and he can be prepared for discharge.  Dietitian also following.   Low-grade fever on 10/19: No foci of infection apparent-could have been from atelectasis-not on any antimicrobial therapy.  Blood culture on 10/20 neg so far, chest x-ray on 10/20 neg for PNA.  Remains afebrile since then.  Follow.  Sepsis secondary to complicated UTI: Sepsis pathophysiology has resolved, urine culture positive for Klebsiella-has completed a course of IV Rocephin.  Blood cultures remain negative.    Aspiration pneumonia: This occurred in the early part of his hospitalization-felt to be secondary to o SBO/vomiting with underlying frailty.  Has completed a course of antimicrobial therapy.  Continue to mobilize as much as possible.    Acute metabolic encephalopathy: Improved and back to his usual baseline.  Encephalopathy was present on initial presentation-this was felt secondary to UTI, and developing SBO.Marland Kitchen  Anemia: Suspect anemia secondary to acute illness, and IV fluid dilution.  Has been transfused a total of 3 units so far, last transfusion on 10/20.  Follow CBC periodically.  No  indication of any overt blood loss at this time.   AKI: Hemodynamically mediated, resolved.  Schizophrenia: Appears stable-continue IV Depakote and olanzapine.    Hypertension: Blood pressure  appears stable-continue to hold antihypertensives.   Severe malnutrition: Continue TNA-we will start low-dose nutritional supplements when diet is more stable.    Deconditioning/debility: Suspect has significant amount of debility at baseline-he is very cachectic-deconditioning/debility has worsened due to acute illness/SBO.  PT following.  Hyperkalemia - resolved after 1 dose of Lasix will monitor.  Other issues:  Patient's sister initially wanted guarantees with surgery, wanted transfer to [facility] (refused by [facility]).  See my prior notes.  I have reviewed notes by Dr. Marena Chancy issues with patient's sister refusing NG tube placement, wanting transfer to [facility] ([facility] [place] [facility]).  Per nursing staff-she has on occasion been very disruptive to their workflow and in the care of the patient.  I completely agree with Dr. Marena Chancy concerns whether patient's sister is the best person to be making decisions for this patient-given numerous documentations by nursing staff and by prior MDs.  Social work, risk Insurance account manager, Camera operator and palliative care following.   Please  see my note from 05/17/2018.   DVT Prophylaxis:  Prophylactic Heparin   Code Status: Full code   Family Communication:  Sister bedside on 05/22/2018, 05/24/18 with nursing staff present  Disposition Plan:  SNF in 2-3 days.  Antimicrobial agents:  Anti-infectives (From admission, onward)   Start     Dose/Rate Route Frequency Ordered Stop   05/10/18 0941  ceFAZolin (ANCEF) 2-4 GM/100ML-% IVPB    Note to Pharmacy:  [name]   : cabinet override      05/10/18 0941 05/10/18 2144   05/01/18 1215  metroNIDAZOLE (FLAGYL) IVPB 500 mg  Status:  Discontinued     500 mg 100 mL/hr over 60 Minutes Intravenous Every 8 hours 05/01/18 1201 05/06/18 1153   04/30/18 1100  cefTRIAXone (ROCEPHIN) 2 g in sodium chloride 0.9 % 100 mL IVPB  Status:  Discontinued     2 g 200 mL/hr over 30  Minutes Intravenous Every 24 hours 04/29/18 1416 05/06/18 1153   04/29/18 1430  cefTRIAXone (ROCEPHIN) 1 g in sodium chloride 0.9 % 100 mL IVPB     1 g 200 mL/hr over 30 Minutes Intravenous Every 24 hours 04/29/18 1416 04/30/18 1429   04/29/18 1130  cefTRIAXone (ROCEPHIN) 1 g in sodium chloride 0.9 % 100 mL IVPB     1 g 200 mL/hr over 30 Minutes Intravenous  Once 04/29/18 1118 04/29/18 1251      Procedures: None  CONSULTS:  None  Time spent: 25 minutes   MEDICATIONS: Scheduled Meds: . benztropine  1 mg Oral BID  . Chlorhexidine Gluconate Cloth  6 each Topical 2 times per day on Tue  . feeding supplement (PRO-STAT SUGAR FREE 64)  30 mL Oral TID WC  . heparin  5,000 Units Subcutaneous Q8H  . lactose free nutrition  237 mL Oral TID WC  . lip balm  1 application Topical BID  . OLANZapine zydis  10 mg Oral BID  . polyethylene glycol  17 g Oral Daily  . polyvinyl alcohol  1 drop Both Eyes TID   Continuous Infusions: . sodium chloride 10 mL/hr at 05/30/18 1710  . TPN ADULT (ION) 75 mL/hr at 05/30/18 1710  . TPN ADULT (ION)    . valproate sodium 250 mg (05/31/18 0651)   PRN Meds:.sodium chloride, acetaminophen, albuterol, alum & mag  hydroxide-simeth, diphenhydrAMINE, magic mouthwash, menthol-cetylpyridinium, metoprolol tartrate, ondansetron (ZOFRAN) IV, phenol, sodium chloride flush, traMADol   PHYSICAL EXAM: Vital signs: Vitals:   05/30/18 1129 05/30/18 1421 05/30/18 2054 05/31/18 0505  BP:  91/65 110/69 99/66  Pulse:  79 88 81  Resp:  16 18 16   Temp:  98.4 F (36.9 C) (!) 97.5 F (36.4 C) 98.8 F (37.1 C)  TempSrc:  Oral Oral Oral  SpO2:  100% 100% 100%  Weight: 46.4 kg     Height: 5\' 6"  (1.676 m)      Filed Weights   04/29/18 0842 05/30/18 1129  Weight: 49.9 kg 46.4 kg   Body mass index is 16.53 kg/m.   Exam  Awake, sitting in chair in no distress [place].AT,PERRAL Supple Neck,No JVD, No cervical lymphadenopathy appriciated.  Symmetrical Chest wall movement,  Good air movement bilaterally, CTAB RRR,No Gallops, Rubs or new Murmurs, No Parasternal Heave +ve B.Sounds, Abd Soft, No tenderness, No organomegaly appriciated, No rebound - guarding or rigidity. No Cyanosis, Clubbing or edema, No new Rash or bruise    I have personally reviewed following labs and imaging studies  LABORATORY DATA: CBC: Recent Labs  Lab 05/29/18 0331 05/30/18 0353  WBC 8.5 9.7  NEUTROABS  --  6.5  HGB 8.8* 9.9*  HCT 30.8* 31.8*  MCV 96.0 93.8  PLT 295 284    Basic Metabolic Panel: Recent Labs  Lab 05/26/18 0445 05/27/18 0402 05/28/18 0443 05/29/18 0331 05/30/18 0353 05/30/18 1002 05/31/18 0317  NA 138 137 138 137  --  140 139  K 5.0 4.5 4.6 4.3  --  4.0 4.0  CL 109 107 110 110  --  111 111  CO2 25 25 24 22   --  24 24  GLUCOSE 87 99 94 97  --  101* 90  BUN 69* 58* 49* 41*  --  42* 41*  CREATININE 0.87 0.80 0.79 0.74  --  0.64 0.66  CALCIUM 9.3 9.3 9.3 9.1  --  9.3 9.0  MG  --  2.5* 2.0 1.7  --  1.9 2.0  PHOS 5.3* 4.1  --   --  3.9  --   --     GFR: Estimated Creatinine Clearance: 59.6 mL/min (by C-G formula based on SCr of 0.66 mg/dL).  Liver Function Tests: Recent Labs  Lab 05/30/18 1002  AST 11*  ALT 10  ALKPHOS 104  BILITOT 0.4  PROT 7.4  ALBUMIN 2.7*   No results for input(s): LIPASE, AMYLASE in the last 168 hours. No results for input(s): AMMONIA in the last 168 hours.  Coagulation Profile: No results for input(s): INR, PROTIME in the last 168 hours.  Cardiac Enzymes: No results for input(s): CKTOTAL, CKMB, CKMBINDEX, TROPONINI in the last 168 hours.  BNP (last 3 results) No results for input(s): PROBNP in the last 8760 hours.  HbA1C: No results for input(s): HGBA1C in the last 72 hours.  CBG: Recent Labs  Lab 05/30/18 0127 05/30/18 0813 05/30/18 1651 05/31/18 0046 05/31/18 0755  GLUCAP 96 91 84 87 88    Lipid Profile: Recent Labs    05/30/18 0353  TRIG 30    Thyroid Function Tests: No results for  input(s): TSH, T4TOTAL, FREET4, T3FREE, THYROIDAB in the last 72 hours.  Anemia Panel: No results for input(s): VITAMINB12, FOLATE, FERRITIN, TIBC, IRON, RETICCTPCT in the last 72 hours.  Urine analysis:    Component Value Date/Time   COLORURINE YELLOW 05/15/2018 1109   APPEARANCEUR CLEAR 05/15/2018 1109  LABSPEC 1.013 05/15/2018 1109   PHURINE 7.0 05/15/2018 1109   GLUCOSEU NEGATIVE 05/15/2018 1109   HGBUR SMALL (A) 05/15/2018 1109   BILIRUBINUR NEGATIVE 05/15/2018 1109   KETONESUR NEGATIVE 05/15/2018 1109   PROTEINUR NEGATIVE 05/15/2018 1109   UROBILINOGEN 1.0 06/08/2010 1523   NITRITE NEGATIVE 05/15/2018 1109   LEUKOCYTESUR TRACE (A) 05/15/2018 1109    Sepsis Labs: Lactic Acid, Venous    Component Value Date/Time   LATICACIDVEN 1.07 04/29/2018 1334    MICROBIOLOGY: No results found for this or any previous visit (from the past 240 hour(s)).  RADIOLOGY STUDIES/RESULTS: Dg Abd 1 View  Result Date: 05/20/2018 CLINICAL DATA:  NG tube adjustment EXAM: ABDOMEN - 1 VIEW COMPARISON:  05/20/2018 FINDINGS: Esophageal tube tip and side-port project over gastric fundus, somewhat abrupt appearing curvature at the distal tip with persistent gaseous dilatation. Continued marked gaseous enlargement of bowel in the left abdomen up to 5 cm. Moderate stool in the colon. Residual contrast within the bladder. IMPRESSION: 1. Esophageal tube tip and side port overlie the gastric fundus, somewhat abrupt curvature of the tip, could be due to mild kinking. 2. Persistent dilatation of stomach and left abdominal small bowel consistent with a bowel obstruction. Electronically Signed   By: Jasmine Pang M.D.   On: 05/20/2018 18:37   Dg Abd 1 View  Result Date: 05/20/2018 CLINICAL DATA:  NG tube placement. EXAM: ABDOMEN - 1 VIEW COMPARISON:  CT abdomen pelvis from same day. FINDINGS: Tip of the NG tube in the stomach with the proximal side port at the gastroesophageal junction. Dilated loops of small  bowel in the left abdomen are unchanged. Contrast is seen within the renal collecting systems and bladder. No acute osseous abnormality. IMPRESSION: 1. NG tube tip in the stomach with the proximal side port at the gastroesophageal junction. Consider advancing 2-3 cm. 2. Unchanged small bowel obstruction. Electronically Signed   By: Obie Dredge M.D.   On: 05/20/2018 16:47   Dg Abd 1 View  Result Date: 05/16/2018 CLINICAL DATA:  Feeding tube advanced today. EXAM: ABDOMEN - 1 VIEW COMPARISON:  Plain films of the abdomen dated 05/15/2018 and 05/14/2018. FINDINGS: Enteric tube is coiled in the stomach with tip at the level of the gastric fundus/cardia. Distended gas-filled loops of large and small bowel are again appreciated throughout the abdomen and upper pelvis. IMPRESSION: 1. Feeding tube is coiled in the stomach with tip at the level of the gastric fundus/cardia. 2. Persistent gaseous distention of both large and small bowel loops throughout the abdomen and pelvis, compatible with previous reports of obstruction or ileus. Electronically Signed   By: Bary Richard M.D.   On: 05/16/2018 20:26   Dg Abd 1 View  Result Date: 05/03/2018 CLINICAL DATA:  Small bowel obstruction EXAM: ABDOMEN - 1 VIEW COMPARISON:  05/02/2018 FINDINGS: NG tube is in the stomach. Gaseous distention of the stomach and left abdominal small bowel loops. Gas and stool within nondistended colon. No free air or organomegaly. IMPRESSION: Gaseous distention of stomach and left abdominal small bowel loops likely reflecting small bowel obstruction. NG tube is in the stomach. Electronically Signed   By: Charlett Nose M.D.   On: 05/03/2018 10:05   Dg Abd 1 View  Result Date: 05/02/2018 CLINICAL DATA:  Small bowel obstruction EXAM: ABDOMEN - 1 VIEW COMPARISON:  May 01, 2018 abdominal radiograph and CT abdomen and pelvis April 30, 2018 FINDINGS: Nasogastric tube no longer present. There is slightly less bowel dilatation in the left upper  quadrant compared to 1 day prior. There is moderate air in the stomach. No bowel dilatation elsewhere. No air-fluid levels. No free air. There is moderate stool in the colon. There is atelectatic change in the right lung base. IMPRESSION: Nasogastric tube no longer appreciable. Less small bowel dilatation in the left upper quadrant. Question resolving bowel obstruction. No free air evident. Electronically Signed   By: Bretta Bang III M.D.   On: 05/02/2018 14:20   Ct Abdomen Pelvis W Contrast  Result Date: 05/20/2018 CLINICAL DATA:  Nausea and vomiting. 10 days status post laparoscopic lysis of adhesions and bowel resection. EXAM: CT ABDOMEN AND PELVIS WITH CONTRAST TECHNIQUE: Multidetector CT imaging of the abdomen and pelvis was performed using the standard protocol following bolus administration of intravenous contrast. CONTRAST:  ISOVUE-300 IOPAMIDOL (ISOVUE-300) INJECTION 61% COMPARISON:  04/30/2018 FINDINGS: Lower Chest: Decreased airspace disease seen in both lower lobes, likely due to resolving pneumonia. Hepatobiliary: A few tiny left hepatic lobe cysts are seen. No hepatic masses identified. Tiny calcified gallstones or layering sludge noted, however there is no evidence of cholecystitis or biliary ductal dilatation. Pancreas:  No mass or inflammatory changes. Spleen: Within normal limits in size and appearance. Adrenals/Urinary Tract: No masses identified. No evidence of hydronephrosis. Stomach/Bowel: Small amount of free air is seen, likely postoperative in etiology. No abscess identified. Multiple moderately dilated fluid-filled small bowel loops are seen in the left abdomen, with transition to nondilated small bowel in the left lower quadrant in area of surgical staples. This is consistent with a small-bowel obstruction. No mass or inflammatory process identified. Moderate amount of stool again seen in the right colon. Vascular/Lymphatic: No pathologically enlarged lymph nodes. No  abdominal aortic aneurysm. Reproductive: Mild-to-moderate enlargement of prostate gland shows no significant change. Other: Subcutaneous emphysema is seen in the left anterior abdominal wall, likely postoperative in etiology. Musculoskeletal:  No suspicious bone lesions identified. IMPRESSION: Small bowel obstruction, with transition point in left lower quadrant. This may be due to adhesion, as no obstructing mass or inflammatory process identified. Small amount of postop intraperitoneal air and subcutaneous emphysema. No evidence of abscess. Cholelithiasis.  No radiographic evidence of cholecystitis. Decreased bilateral lower lobe airspace disease, consistent with resolving pneumonia or inflammatory process. Electronically Signed   By: Myles Rosenthal M.D.   On: 05/20/2018 13:41   Dg Chest Port 1 View  Result Date: 05/29/2018 CLINICAL DATA:  Order for SBO EXAM: PORTABLE CHEST 1 VIEW COMPARISON:  Radiograph 05/15/2018 FINDINGS: NG tube extends into the stomach. PICC line unchanged. Normal cardiac silhouette with ectatic aorta. Lungs are hyperinflated. Remote RIGHT rib fractures. IMPRESSION: No acute cardiopulmonary process. Electronically Signed   By: Genevive Bi M.D.   On: 05/29/2018 10:41   Dg Chest Port 1 View  Result Date: 05/15/2018 CLINICAL DATA:  Shortness of breath. Patient 5 days postop bowel resection with lysis of adhesions. EXAM: PORTABLE CHEST 1 VIEW COMPARISON:  05/11/2018 FINDINGS: Right-sided PICC line with tip over the SVC. Lungs are adequately inflated with subtle left retrocardiac opacification which may be due to vascular crowding or atelectasis and less likely infection. Remainder of the lungs are clear. Cardiomediastinal silhouette is within normal. Persistent evidence of free peritoneal air compatible with patient's recent surgery. IMPRESSION: Mild left base opacification likely atelectasis or vascular crowding and less likely infection. Persistent free peritoneal air compatible  recent postop state. Right-sided PICC line with tip over the SVC. Electronically Signed   By: Elberta Fortis M.D.   On: 05/15/2018 09:38  Dg Chest Port 1 View  Result Date: 05/11/2018 CLINICAL DATA:  Shortness of breath, hypertension, pneumonia, dementia, former smoker EXAM: PORTABLE CHEST 1 VIEW COMPARISON:  Portable exam 0945 hours compared to 05/09/2018 FINDINGS: Nasogastric tube extends into stomach. RIGHT arm PICC line tip projects over SVC. Normal heart size and pulmonary vascularity. Elongation of thoracic aorta. Lungs appear emphysematous but clear. No pleural effusion or pneumothorax. Bones demineralized. Free air identified under the hemidiaphragms bilaterally; EHR indicates patient underwent a laparoscopic procedure on 05/10/2018, small-bowel resection and lysis of adhesions for small-bowel obstruction prior operative note. IMPRESSION: COPD changes without infiltrate. Free air consistent with abdominal surgery 1 day ago. Electronically Signed   By: Ulyses Southward M.D.   On: 05/11/2018 10:21   Dg Chest Port 1 View  Result Date: 05/09/2018 CLINICAL DATA:  Cough EXAM: PORTABLE CHEST 1 VIEW COMPARISON:  05/04/2018 FINDINGS: NG tube is stable. Right upper extremity PICC placed. Tip is at the cavoatrial junction. Lungs remain hyperaerated and clear. Small right pleural effusion is stable. No pneumothorax. Aorta remains somewhat prominent due to tortuosity and rotation of the thorax. IMPRESSION: New right upper extremity PICC with its tip at the cavoatrial junction. No active cardiopulmonary disease. Electronically Signed   By: Jolaine Click M.D.   On: 05/09/2018 07:37   Dg Abd 2 Views  Result Date: 05/05/2018 CLINICAL DATA:  Vomiting.  History of right lumpectomy. EXAM: ABDOMEN - 2 VIEW COMPARISON:  05/04/2018 FINDINGS: Improve dilation of small bowel loops with residual borderline gas-filled distended small bowel loops in the left upper quadrant of the abdomen. Normal colonic bowel gas pattern. No  evidence of free intra-abdominal gas. Normal cardiac silhouette. Tortuosity of the thoracic aorta. Right pleural reflection versus small pleural effusion. No lobar airspace consolidation. Enteric catheter tip within the expected location the gastric body, side hole at the GE junction level. Right PICC line terminates in the expected location of the cavoatrial junction. IMPRESSION: Improved appearance of small-bowel obstruction. Enteric catheter with side hole at the level of the GE junction. Advancement may be considered. Possible small right pleural effusion. Electronically Signed   By: Ted Mcalpine M.D.   On: 05/05/2018 12:51   Dg Abd Acute W/chest  Result Date: 05/04/2018 CLINICAL DATA:  Follow-up small bowel obstruction. EXAM: DG ABDOMEN ACUTE W/ 1V CHEST COMPARISON:  Abdominal x-rays 05/04/1999 19 dating back to 04/29/2018. CT abdomen and pelvis 04/30/2018. Chest x-rays 05/01/2018, 04/12/2018. FINDINGS: Nasogastric tube tip in the fundus of the stomach. Persistent marked gaseous distension of the jejunum in the LEFT UPPER QUADRANT, slowly progressive over the past 3 days, with air-fluid levels on the LATERAL decubitus image. No evidence of free intraperitoneal air. Normal caliber colon with moderate stool burden. Calcified uterine fibroid in the pelvic midline. Cardiac silhouette normal in size, unchanged. Thoracic aorta tortuous and atherosclerotic. Hilar and mediastinal contours otherwise unremarkable. Interval improvement in aeration in the LEFT lung base, with only minimal patchy opacities persisting. Lungs otherwise clear. Emphysematous changes in both lungs with scarring in the RIGHT mid lung, unchanged. Pleuroparenchymal scarring at the RIGHT lung base which accounts for the blunting of the costophrenic angle, unchanged dating back to the original 04/12/2018 examination. IMPRESSION: 1. Persistent partial small bowel obstruction which has slowly worsened over the past 3 days. 2. No evidence of  free intraperitoneal air. 3. Improved aeration in the LEFT LOWER LOBE, with only mild atelectasis and/or pneumonia persisting. Electronically Signed   By: Hulan Saas M.D.   On: 05/04/2018 09:28   Dg  Abd Portable 1v-small Bowel Obstruction Protocol-initial, 8 Hr Delay  Result Date: 05/29/2018 CLINICAL DATA:  8 hour delay small-bowel obstruction film. EXAM: PORTABLE ABDOMEN - 1 VIEW COMPARISON:  05/29/2018 FINDINGS: Contrast material demonstrated throughout the colon. No small or large bowel distention is identified. Enteric tube tip is in the left upper quadrant consistent with location in the upper stomach. Proximal side hole projects at or just above the expected location of the EG junction. No change in position. IMPRESSION: Contrast material throughout the colon. No evidence of bowel obstruction. Electronically Signed   By: Burman Nieves M.D.   On: 05/29/2018 23:44   Dg Abd Portable 1v  Result Date: 05/29/2018 CLINICAL DATA:  Small bowel obstruction. EXAM: PORTABLE ABDOMEN - 1 VIEW COMPARISON:  One-view abdomen 05/28/2018 and 05/27/2018. FINDINGS: Bowel gas pattern is normal. No significant dilated loops of bowel remain. There is no free air. The side port of the NG tube is above the GE junction and could be advanced for more optimal positioning. The lung bases are clear. IMPRESSION: 1. Normal bowel gas pattern. No residual dilated loops of small bowel. 2. The side port of the NG tube is above the GE junction. Electronically Signed   By: Marin Roberts M.D.   On: 05/29/2018 10:42   Dg Abd Portable 1v  Result Date: 05/28/2018 CLINICAL DATA:  Small-bowel obstruction EXAM: PORTABLE ABDOMEN - 1 VIEW COMPARISON:  Portable exam 0746 hours compared to 05/27/2018 FINDINGS: Scattered gas and minimal stool in colon. Few air-filled loops of small bowel in the LEFT mid abdomen, decreased in prominence since previous exam. Single loop demonstrates questionable mild wall bowel wall thickening. Tip of  nasogastric tube projects over mid stomach. Bones appear demineralized. IMPRESSION: Decreased small bowel distention since prior study. Electronically Signed   By: Ulyses Southward M.D.   On: 05/28/2018 08:16   Dg Abd Portable 1v  Result Date: 05/27/2018 CLINICAL DATA:  Small bowel obstruction EXAM: PORTABLE ABDOMEN - 1 VIEW COMPARISON:  05/24/2018 FINDINGS: There is a nasogastric tube with the tip projecting over the stomach. There is mild gaseous distension of the small bowel and colon without evidence of obstruction. There is no evidence of pneumoperitoneum, portal venous gas or pneumatosis. There are no pathologic calcifications along the expected course of the ureters. The osseous structures are unremarkable. IMPRESSION: There is a nasogastric tube with the tip projecting over the stomach. There is mild gaseous distension of the small bowel and colon without evidence of obstruction. Electronically Signed   By: Elige Ko   On: 05/27/2018 09:44   Dg Abd Portable 1v  Result Date: 05/24/2018 CLINICAL DATA:  Follow-up small bowel obstruction. EXAM: PORTABLE ABDOMEN - 1 VIEW COMPARISON:  Portable supine abdominal radiograph of May 21, 2018 FINDINGS: The colonic stool burden is moderate. There are loops of mildly distended gas-filled small bowel to the left of midline. There is gas and stool in the rectum. There is no free extraluminal gas. The esophagogastric tube tip in proximal port project below the GE junction. The bony structures exhibit no acute abnormality. IMPRESSION: There are a few loops of mildly distended gas-filled small bowel to the left of midline. The colonic stool burden and gas pattern is within the limits of normal. There is no evidence of perforation. Electronically Signed   By: Rilee  [place] M.D.   On: 05/24/2018 09:25   Dg Abd Portable 1v-small Bowel Obstruction Protocol-initial, 8 Hr Delay  Result Date: 05/21/2018 CLINICAL DATA:  Small-bowel obstruction.  Follow-up exam. EXAM:  PORTABLE ABDOMEN - 1 VIEW COMPARISON:  05/20/2018 FINDINGS: Significant improvement in small bowel dilation is noted since the prior study. The small bowel obstruction has essentially resolved radiographically. Mild generalized increased stool burden in the colon, unchanged from the previous exam. Midline abdominal wall surgical staples are stable. Nasogastric tube is well positioned, tip in the mid stomach. IMPRESSION: 1. Significant interval improvement from the previous day's exam. Small-bowel obstruction appears resolved radiographically. Electronically Signed   By: Amie Portland M.D.   On: 05/21/2018 13:12   Dg Abd Portable 1v  Result Date: 05/17/2018 CLINICAL DATA:  Small-bowel obstruction. EXAM: PORTABLE ABDOMEN - 1 VIEW COMPARISON:  05/16/2018.  05/15/2018.  CT 04/30/2018. FINDINGS: Surgical staples noted over the abdomen. NG tube noted with its tip in the stomach. Distended loops of small and large bowel are again noted. Similar findings noted on prior exam. No free air. Degenerative change in osteopenia lumbar spine and both hips. Prostate calcifications are noted. Aortoiliac atherosclerotic vascular calcification. Diffuse osteopenia degenerative changes lumbar spine and both hips. Mild blunting of the right costophrenic angle again noted suggesting scarring and/or tiny effusion. IMPRESSION: NG tube noted with its tip in the stomach. Dilated loops of small and large bowel are again noted without significant interim change. No free air identified. Electronically Signed   By: Maisie Fus  Register   On: 05/17/2018 07:32   Dg Abd Portable 1v  Result Date: 05/15/2018 CLINICAL DATA:  NG tube placement EXAM: PORTABLE ABDOMEN - 1 VIEW COMPARISON:  05/15/2018 FINDINGS: NG tube is in placed into the fundus of the stomach. Continued gaseous distention of visualized upper abdominal bowel loops. IMPRESSION: NG tube in the fundus of the stomach Electronically Signed   By: Charlett Nose M.D.   On: 05/15/2018 20:11     Dg Abd Portable 1v  Result Date: 05/15/2018 CLINICAL DATA:  Small bowel obstruction. EXAM: PORTABLE ABDOMEN - 1 VIEW COMPARISON:  One-view abdomen 05/14/2018 FINDINGS: Patient's retained left.  Heart size normal.  Lung bases are clear. Extend loops of large and small bowel are stable. There is no free air or pneumatosis. Surgical clips are noted. IMPRESSION: 1. Similar appearance of distended large and small bowel consistent with obstruction or ileus. Electronically Signed   By: Marin Roberts M.D.   On: 05/15/2018 08:10   Dg Abd Portable 1v  Result Date: 05/14/2018 CLINICAL DATA:  Abdominal pain EXAM: PORTABLE ABDOMEN - 1 VIEW COMPARISON:  05/13/2018 FINDINGS: Interval NG tube removal. Some interval improvement in gaseous distention of the bowel. Stool and air throughout the transverse colon. Postop staples again noted. Air in the rectum. No significant interval change. Degenerative changes of the spine. Basilar chronic interstitial changes and atelectasis. IMPRESSION: NG tube removed. Stable nonspecific bowel gas pattern, suspect resolving obstruction or ileus. Little interval change. Electronically Signed   By: Judie Petit.  Shick M.D.   On: 05/14/2018 10:23   Dg Abd Portable 1v  Result Date: 05/13/2018 CLINICAL DATA:  Abdominal pain, check NG placement EXAM: PORTABLE ABDOMEN - 1 VIEW COMPARISON:  05/09/2018 FINDINGS: Nasogastric catheter is noted with the tip in the stomach. Proximal side port lies in the distal esophagus. This is stable from the prior exam. Scattered large and small bowel gas is noted. Postsurgical changes are now seen. Mild small bowel dilatation is noted which may be related to a postoperative ileus. IMPRESSION: Mild postoperative ileus. Nasogastric catheter as described. Electronically Signed   By: Alcide Clever M.D.   On: 05/13/2018 09:56   Dg Abd Portable 1v  Result Date: 05/09/2018 CLINICAL DATA:  Small-bowel obstruction EXAM: PORTABLE ABDOMEN - 1 VIEW COMPARISON:   05/08/2018; 05/06/2018; 05/05/2018 FINDINGS: Re-demonstrated gaseous distention of several loops of small bowel with index loop of small bowel within the left mid hemiabdomen measuring 3.6 cm in diameter. Moderate colonic stool burden. No supine evidence of pneumoperitoneum. No pneumatosis or portal venous gas. Presumed calcified fibroid overlies the left hemipelvis. Stable position of support apparatus. No acute osseus abnormalities. IMPRESSION: No change to slight improvement in suspected small-bowel obstruction. Electronically Signed   By: Simonne Come M.D.   On: 05/09/2018 07:47   Dg Abd Portable 1v  Result Date: 05/08/2018 CLINICAL DATA:  Follow up small bowel obstruction EXAM: PORTABLE ABDOMEN - 1 VIEW COMPARISON:  05/06/2018 FINDINGS: Scattered large and small bowel gas is noted. Multiple dilated loops of small bowel are again identified and stable in appearance. Nasogastric catheter is again noted within the stomach. No free air is seen. Degenerative changes of the lumbar spine are noted. IMPRESSION: Persistent small bowel dilatation. No new focal abnormality is noted. Electronically Signed   By: Alcide Clever M.D.   On: 05/08/2018 08:36   Dg Abd Portable 1v  Result Date: 05/06/2018 CLINICAL DATA:  Small bowel obstruction EXAM: PORTABLE ABDOMEN - 1 VIEW COMPARISON:  Portable exam at 0628 hours compared to 05/05/2018 FINDINGS: Stool scattered throughout colon. Persistent dilatation of small bowel loops in the LEFT mid abdomen up to 4.9 cm diameter. No bowel wall thickening. Bones demineralized. No definite renal calcifications. IMPRESSION: Persistent small bowel dilatation consistent with small bowel obstruction. Little interval change. Electronically Signed   By: Ulyses Southward M.D.   On: 05/06/2018 10:21   Dg Abd Portable 1v-small Bowel Obstruction Protocol-initial, 8 Hr Delay  Result Date: 05/04/2018 CLINICAL DATA:  Small bowel obstruction protocol. 8 hour delay. EXAM: PORTABLE ABDOMEN - 1 VIEW  COMPARISON:  05/03/2018 FINDINGS: Gaseous distention of left upper quadrant small bowel. Enteric tube with tip projected over the mid abdomen consistent with location in the upper stomach. Contrast material is demonstrated in the dilated small bowel. No contrast material is identified in the colon. This is suggestive of high-grade obstruction. Degenerative changes in the spine. IMPRESSION: Contrast material is demonstrated in the dilated small bowel but not in the colon consistent with high-grade obstruction. Electronically Signed   By: Burman Nieves M.D.   On: 05/04/2018 01:55   Dg Abd Portable 1v-small Bowel Obstruction Protocol-initial, 8 Hr Delay  Result Date: 05/01/2018 CLINICAL DATA:  Small bowel obstruction. EXAM: PORTABLE ABDOMEN - 1 VIEW COMPARISON:  Radiograph earlier this day at 1109 hour, CT yesterday. FINDINGS: Enteric tube in place with tip in the stomach, side-port just beyond the gastroesophageal junction. Improving small bowel dilatation in the left abdomen. Moderate stool in the right and transverse colon. No evidence of free air. Pelvic calcification may be stone in the urinary bladder or exophytic prostate calcification. IMPRESSION: Improving small bowel dilatation in the left abdomen since radiograph earlier this day. Electronically Signed   By: Narda Rutherford M.D.   On: 05/01/2018 21:14   Dg Abd Portable 1v-small Bowel Protocol-position Verification  Result Date: 05/01/2018 CLINICAL DATA:  Nasogastric tube placement EXAM: PORTABLE ABDOMEN - 1 VIEW COMPARISON:  Portable exam 1109 hours compared to 04/30/2018 FINDINGS: Tip of nasogastric tube projects over mid stomach. Dilated small bowel loops are seen in the LEFT mid abdomen. Increased stool in RIGHT colon. Lung bases appear emphysematous with subsegmental atelectasis at LEFT base. Bones demineralized. IMPRESSION: Tip of nasogastric  tube projects over mid stomach. Small bowel dilatation in LEFT mid abdomen with increased stool in  RIGHT colon. Electronically Signed   By: Ulyses Southward M.D.   On: 05/01/2018 11:22   [place] Ekg Site Rite  Result Date: 05/04/2018 If Site Rite image not attached, placement could not be confirmed due to current cardiac rhythm.    LOS: 32 days   Signature  Susa Raring M.D on 05/31/2018 at 10:17 AM   -  To page go to [email or web address] - password [email or web address]

## 2018-05-31 NOTE — Progress Notes (Signed)
Patient out of bed to chair for dinnerfor dinner.

## 2018-05-31 NOTE — Progress Notes (Signed)
PT Cancellation Note  Patient Details Name: Jeffrey Davila MRN: [id number] DOB: February 10, 1951   Cancelled Treatment:    Reason Eval/Treat Not Completed: Patient declined, no reason specified.  "My stomach is upset." 05/31/2018  [place] Bing, PT [facility] [phone]  (pager) [phone]  (office)   Eliseo Gum Jandiel Magallanes 05/31/2018, 1:53 PM

## 2018-06-01 DIAGNOSIS — I10 Essential (primary) hypertension: Secondary | ICD-10-CM

## 2018-06-01 LAB — GLUCOSE, CAPILLARY
Glucose-Capillary: 79 mg/dL (ref 70–99)
Glucose-Capillary: 70 mg/dL (ref 70–99)

## 2018-06-01 MED ORDER — TRAVASOL 10 % IV SOLN
INTRAVENOUS | Status: AC
  Administered 2018-06-01: 17:00:00 via INTRAVENOUS
  Filled 2018-06-01: qty 462

## 2018-06-01 NOTE — Progress Notes (Signed)
PROGRESS NOTE        PATIENT DETAILS Name: Jeffrey Davila Age: 67 y.o. Sex: male Date of Birth: 07-Dec-1950 Admit Date: 04/29/2018 Admitting Physician Starleen Arms, MD PCP:Uba, Reuel Boom, MD  Brief Narrative: Patient is a 67 y.o. male with history of schizophrenia/?  Cognitive dysfunction (dementia), hypertension, BPH, remote history (in the 1990s) of trauma causing small bowel injury requiring laparotomy-mostly wheelchair-bound-presented to the hospital with approximately 1 week history of intermittent vomiting, confusion for a few days prior to this hospital admission-presented with sepsis secondary to UTI, vomiting and significant constipation.  Patient was started on IV antibiotics and admitted to the hospital service,initial imaging was suggestive of constipation causing gastric distention-NG tube was placed, however upon further evaluation with a CT scan he was found  to have a small bowel obstruction.  General surgery was consulted, even with n.p.o. status/NG tube decompression-he continues to have SBO-patient's sister has refused to sign consent for surgery and wanted patient to receive several days of TNA before she consented to surgery.  Subsequently underwent laparotomy with lysis of adhesions on 10/15, postoperative course complicated by ileus which seems to finally have resolved on 05/30/2018.  See below for details.  Subjective:  Upper GI at bedside-follows commands.  Per nursing staff tolerating liquids.  No vomiting.    Assessment/Plan: Small bowel obstruction: Underwent laparotomy with lysis of adhesions 05/10/2018 - postoperative course complicated by ileus, recurrent bowel obstruction and constipation.  Managed conservatively-with bowel rest and subsequent enemas.  Thankfully much improved-no longer with an NG tube (removed 11/4)-tolerating a dysphagia 1 diet.  General surgery recommending to decrease TNA today-as diet being increased.  If patient  continues to improve-can be discharged to SNF on 11/7.    Sepsis secondary to complicated UTI: Sepsis pathophysiology has resolved, urine culture positive for Klebsiella-has completed a course of IV Rocephin.  Blood cultures remain negative.   Aspiration pneumonia: This occurred in the early part of his hospitalization-secondary to recurrent vomiting in the setting of SBO and underlying frailty.  Has completed a course of antimicrobial therapy.  Continue to mobilize.  Acute metabolic encephalopathy: Resolved-secondary to UTI/SBO.  Back to baseline.  Improved and back to his usual baseline.    Anemia: Suspect anemia secondary to acute illness and IV fluid dilution. Has been transfused a total of 3 units of PRBC so far-last was on 10/20.  Hemoglobin stable at 9.9.  AKI: Hemodynamically mediated, resolved.  Schizophrenia: Appears stable-continue Depakote and olanzapine.    Hypertension: Blood pressure appears stable-continue to hold antihypertensives.   Severe malnutrition: Continue TNA-we will start low-dose nutritional supplements when diet is more stable.    Deconditioning/debility: Suspect has significant amount of debility at baseline-he is very cachectic-deconditioning/debility has worsened due to acute illness/SBO.  PT following.  Plans are for SNF on discharge.  DVT Prophylaxis:  Prophylactic Heparin   Code Status: Full code   Family Communication: None at bedside  Disposition Plan:  SNF tomorrow if clinical improvement continues  Antimicrobial agents:  Anti-infectives (From admission, onward)   Start     Dose/Rate Route Frequency Ordered Stop   05/10/18 0941  ceFAZolin (ANCEF) 2-4 GM/100ML-% IVPB    Note to Pharmacy:  [name]   : cabinet override      05/10/18 0941 05/10/18 2144   05/01/18 1215  metroNIDAZOLE (FLAGYL) IVPB 500 mg  Status:  Discontinued     500 mg 100 mL/hr over 60 Minutes Intravenous Every 8 hours 05/01/18 1201 05/06/18 1153   04/30/18 1100   cefTRIAXone (ROCEPHIN) 2 g in sodium chloride 0.9 % 100 mL IVPB  Status:  Discontinued     2 g 200 mL/hr over 30 Minutes Intravenous Every 24 hours 04/29/18 1416 05/06/18 1153   04/29/18 1430  cefTRIAXone (ROCEPHIN) 1 g in sodium chloride 0.9 % 100 mL IVPB     1 g 200 mL/hr over 30 Minutes Intravenous Every 24 hours 04/29/18 1416 04/30/18 1429   04/29/18 1130  cefTRIAXone (ROCEPHIN) 1 g in sodium chloride 0.9 % 100 mL IVPB     1 g 200 mL/hr over 30 Minutes Intravenous  Once 04/29/18 1118 04/29/18 1251      Procedures: None  CONSULTS:  None  Time spent: 25 minutes   MEDICATIONS: Scheduled Meds: . benztropine  1 mg Oral BID  . Chlorhexidine Gluconate Cloth  6 each Topical 2 times per day on Tue  . feeding supplement (PRO-STAT SUGAR FREE 64)  30 mL Oral TID WC  . heparin  5,000 Units Subcutaneous Q8H  . lactose free nutrition  237 mL Oral TID WC  . lip balm  1 application Topical BID  . OLANZapine zydis  10 mg Oral BID  . polyethylene glycol  17 g Oral Daily  . polyvinyl alcohol  1 drop Both Eyes TID   Continuous Infusions: . sodium chloride Stopped (05/31/18 0506)  . TPN ADULT (ION) 75 mL/hr at 06/01/18 0600  . TPN ADULT (ION)    . valproate sodium 250 mg (06/01/18 1110)   PRN Meds:.sodium chloride, acetaminophen, albuterol, alum & mag hydroxide-simeth, diphenhydrAMINE, magic mouthwash, menthol-cetylpyridinium, metoprolol tartrate, ondansetron (ZOFRAN) IV, phenol, sodium chloride flush, traMADol   PHYSICAL EXAM: Vital signs: Vitals:   05/30/18 2054 05/31/18 0505 05/31/18 1439 06/01/18 0531  BP: 110/69 99/66 92/60  98/61  Pulse: 88 81 73 79  Resp: 18 16 16 18   Temp: (!) 97.5 F (36.4 C) 98.8 F (37.1 C) 97.6 F (36.4 C) 98.3 F (36.8 C)  TempSrc: Oral Oral Oral Oral  SpO2: 100% 100% 100% 100%  Weight:      Height:       Filed Weights   04/29/18 0842 05/30/18 1129  Weight: 49.9 kg 46.4 kg   Body mass index is 16.53 kg/m.   Exam General appearance:Awake,  alert, not in any distress.  Answers simple questions yes no-follows some commands. Eyes:no scleral icterus. HEENT: Atraumatic and Normocephalic Neck: supple, no JVD. Resp:Good air entry bilaterally,no rales or rhonchi CVS: S1 S2 regular, no murmurs.  GI: Bowel sounds present, Non tender and not distended with no gaurding, rigidity or rebound. Extremities: B/L Lower Ext shows no edema, both legs are warm to touch Neurology:  Non focal-but with generalized weakness Musculoskeletal:No digital cyanosis Skin:No Rash, warm and dry Wounds:N/A    I have personally reviewed following labs and imaging studies  LABORATORY DATA: CBC: Recent Labs  Lab 05/29/18 0331 05/30/18 0353  WBC 8.5 9.7  NEUTROABS  --  6.5  HGB 8.8* 9.9*  HCT 30.8* 31.8*  MCV 96.0 93.8  PLT 295 284    Basic Metabolic Panel: Recent Labs  Lab 05/26/18 0445 05/27/18 0402 05/28/18 0443 05/29/18 0331 05/30/18 0353 05/30/18 1002 05/31/18 0317  NA 138 137 138 137  --  140 139  K 5.0 4.5 4.6 4.3  --  4.0 4.0  CL 109 107 110 110  --  111 111  CO2 25 25 24 22   --  24 24  GLUCOSE 87 99 94 97  --  101* 90  BUN 69* 58* 49* 41*  --  42* 41*  CREATININE 0.87 0.80 0.79 0.74  --  0.64 0.66  CALCIUM 9.3 9.3 9.3 9.1  --  9.3 9.0  MG  --  2.5* 2.0 1.7  --  1.9 2.0  PHOS 5.3* 4.1  --   --  3.9  --   --     GFR: Estimated Creatinine Clearance: 59.6 mL/min (by C-G formula based on SCr of 0.66 mg/dL).  Liver Function Tests: Recent Labs  Lab 05/30/18 1002  AST 11*  ALT 10  ALKPHOS 104  BILITOT 0.4  PROT 7.4  ALBUMIN 2.7*   No results for input(s): LIPASE, AMYLASE in the last 168 hours. No results for input(s): AMMONIA in the last 168 hours.  Coagulation Profile: No results for input(s): INR, PROTIME in the last 168 hours.  Cardiac Enzymes: No results for input(s): CKTOTAL, CKMB, CKMBINDEX, TROPONINI in the last 168 hours.  BNP (last 3 results) No results for input(s): PROBNP in the last 8760  hours.  HbA1C: No results for input(s): HGBA1C in the last 72 hours.  CBG: Recent Labs  Lab 05/30/18 1651 05/31/18 0046 05/31/18 0755 05/31/18 1716 06/01/18 0820  GLUCAP 84 87 88 78 79    Lipid Profile: Recent Labs    05/30/18 0353  TRIG 30    Thyroid Function Tests: No results for input(s): TSH, T4TOTAL, FREET4, T3FREE, THYROIDAB in the last 72 hours.  Anemia Panel: No results for input(s): VITAMINB12, FOLATE, FERRITIN, TIBC, IRON, RETICCTPCT in the last 72 hours.  Urine analysis:    Component Value Date/Time   COLORURINE YELLOW 05/15/2018 1109   APPEARANCEUR CLEAR 05/15/2018 1109   LABSPEC 1.013 05/15/2018 1109   PHURINE 7.0 05/15/2018 1109   GLUCOSEU NEGATIVE 05/15/2018 1109   HGBUR SMALL (A) 05/15/2018 1109   BILIRUBINUR NEGATIVE 05/15/2018 1109   KETONESUR NEGATIVE 05/15/2018 1109   PROTEINUR NEGATIVE 05/15/2018 1109   UROBILINOGEN 1.0 06/08/2010 1523   NITRITE NEGATIVE 05/15/2018 1109   LEUKOCYTESUR TRACE (A) 05/15/2018 1109    Sepsis Labs: Lactic Acid, Venous    Component Value Date/Time   LATICACIDVEN 1.07 04/29/2018 1334    MICROBIOLOGY: No results found for this or any previous visit (from the past 240 hour(s)).  RADIOLOGY STUDIES/RESULTS: Dg Abd 1 View  Result Date: 05/20/2018 CLINICAL DATA:  NG tube adjustment EXAM: ABDOMEN - 1 VIEW COMPARISON:  05/20/2018 FINDINGS: Esophageal tube tip and side-port project over gastric fundus, somewhat abrupt appearing curvature at the distal tip with persistent gaseous dilatation. Continued marked gaseous enlargement of bowel in the left abdomen up to 5 cm. Moderate stool in the colon. Residual contrast within the bladder. IMPRESSION: 1. Esophageal tube tip and side port overlie the gastric fundus, somewhat abrupt curvature of the tip, could be due to mild kinking. 2. Persistent dilatation of stomach and left abdominal small bowel consistent with a bowel obstruction. Electronically Signed   By: Jasmine Pang  M.D.   On: 05/20/2018 18:37   Dg Abd 1 View  Result Date: 05/20/2018 CLINICAL DATA:  NG tube placement. EXAM: ABDOMEN - 1 VIEW COMPARISON:  CT abdomen pelvis from same day. FINDINGS: Tip of the NG tube in the stomach with the proximal side port at the gastroesophageal junction. Dilated loops of small bowel in the left abdomen are unchanged. Contrast is seen within the renal  collecting systems and bladder. No acute osseous abnormality. IMPRESSION: 1. NG tube tip in the stomach with the proximal side port at the gastroesophageal junction. Consider advancing 2-3 cm. 2. Unchanged small bowel obstruction. Electronically Signed   By: Obie Dredge M.D.   On: 05/20/2018 16:47   Dg Abd 1 View  Result Date: 05/16/2018 CLINICAL DATA:  Feeding tube advanced today. EXAM: ABDOMEN - 1 VIEW COMPARISON:  Plain films of the abdomen dated 05/15/2018 and 05/14/2018. FINDINGS: Enteric tube is coiled in the stomach with tip at the level of the gastric fundus/cardia. Distended gas-filled loops of large and small bowel are again appreciated throughout the abdomen and upper pelvis. IMPRESSION: 1. Feeding tube is coiled in the stomach with tip at the level of the gastric fundus/cardia. 2. Persistent gaseous distention of both large and small bowel loops throughout the abdomen and pelvis, compatible with previous reports of obstruction or ileus. Electronically Signed   By: Bary Richard M.D.   On: 05/16/2018 20:26   Dg Abd 1 View  Result Date: 05/03/2018 CLINICAL DATA:  Small bowel obstruction EXAM: ABDOMEN - 1 VIEW COMPARISON:  05/02/2018 FINDINGS: NG tube is in the stomach. Gaseous distention of the stomach and left abdominal small bowel loops. Gas and stool within nondistended colon. No free air or organomegaly. IMPRESSION: Gaseous distention of stomach and left abdominal small bowel loops likely reflecting small bowel obstruction. NG tube is in the stomach. Electronically Signed   By: Charlett Nose M.D.   On: 05/03/2018  10:05   Dg Abd 1 View  Result Date: 05/02/2018 CLINICAL DATA:  Small bowel obstruction EXAM: ABDOMEN - 1 VIEW COMPARISON:  May 01, 2018 abdominal radiograph and CT abdomen and pelvis April 30, 2018 FINDINGS: Nasogastric tube no longer present. There is slightly less bowel dilatation in the left upper quadrant compared to 1 day prior. There is moderate air in the stomach. No bowel dilatation elsewhere. No air-fluid levels. No free air. There is moderate stool in the colon. There is atelectatic change in the right lung base. IMPRESSION: Nasogastric tube no longer appreciable. Less small bowel dilatation in the left upper quadrant. Question resolving bowel obstruction. No free air evident. Electronically Signed   By: Bretta Bang III M.D.   On: 05/02/2018 14:20   Ct Abdomen Pelvis W Contrast  Result Date: 05/20/2018 CLINICAL DATA:  Nausea and vomiting. 10 days status post laparoscopic lysis of adhesions and bowel resection. EXAM: CT ABDOMEN AND PELVIS WITH CONTRAST TECHNIQUE: Multidetector CT imaging of the abdomen and pelvis was performed using the standard protocol following bolus administration of intravenous contrast. CONTRAST:  ISOVUE-300 IOPAMIDOL (ISOVUE-300) INJECTION 61% COMPARISON:  04/30/2018 FINDINGS: Lower Chest: Decreased airspace disease seen in both lower lobes, likely due to resolving pneumonia. Hepatobiliary: A few tiny left hepatic lobe cysts are seen. No hepatic masses identified. Tiny calcified gallstones or layering sludge noted, however there is no evidence of cholecystitis or biliary ductal dilatation. Pancreas:  No mass or inflammatory changes. Spleen: Within normal limits in size and appearance. Adrenals/Urinary Tract: No masses identified. No evidence of hydronephrosis. Stomach/Bowel: Small amount of free air is seen, likely postoperative in etiology. No abscess identified. Multiple moderately dilated fluid-filled small bowel loops are seen in the left abdomen, with  transition to nondilated small bowel in the left lower quadrant in area of surgical staples. This is consistent with a small-bowel obstruction. No mass or inflammatory process identified. Moderate amount of stool again seen in the right colon. Vascular/Lymphatic: No pathologically  enlarged lymph nodes. No abdominal aortic aneurysm. Reproductive: Mild-to-moderate enlargement of prostate gland shows no significant change. Other: Subcutaneous emphysema is seen in the left anterior abdominal wall, likely postoperative in etiology. Musculoskeletal:  No suspicious bone lesions identified. IMPRESSION: Small bowel obstruction, with transition point in left lower quadrant. This may be due to adhesion, as no obstructing mass or inflammatory process identified. Small amount of postop intraperitoneal air and subcutaneous emphysema. No evidence of abscess. Cholelithiasis.  No radiographic evidence of cholecystitis. Decreased bilateral lower lobe airspace disease, consistent with resolving pneumonia or inflammatory process. Electronically Signed   By: Myles Rosenthal M.D.   On: 05/20/2018 13:41   Dg Chest Port 1 View  Result Date: 05/29/2018 CLINICAL DATA:  Order for SBO EXAM: PORTABLE CHEST 1 VIEW COMPARISON:  Radiograph 05/15/2018 FINDINGS: NG tube extends into the stomach. PICC line unchanged. Normal cardiac silhouette with ectatic aorta. Lungs are hyperinflated. Remote RIGHT rib fractures. IMPRESSION: No acute cardiopulmonary process. Electronically Signed   By: Genevive Bi M.D.   On: 05/29/2018 10:41   Dg Chest Port 1 View  Result Date: 05/15/2018 CLINICAL DATA:  Shortness of breath. Patient 5 days postop bowel resection with lysis of adhesions. EXAM: PORTABLE CHEST 1 VIEW COMPARISON:  05/11/2018 FINDINGS: Right-sided PICC line with tip over the SVC. Lungs are adequately inflated with subtle left retrocardiac opacification which may be due to vascular crowding or atelectasis and less likely infection. Remainder of  the lungs are clear. Cardiomediastinal silhouette is within normal. Persistent evidence of free peritoneal air compatible with patient's recent surgery. IMPRESSION: Mild left base opacification likely atelectasis or vascular crowding and less likely infection. Persistent free peritoneal air compatible recent postop state. Right-sided PICC line with tip over the SVC. Electronically Signed   By: Elberta Fortis M.D.   On: 05/15/2018 09:38   Dg Chest Port 1 View  Result Date: 05/11/2018 CLINICAL DATA:  Shortness of breath, hypertension, pneumonia, dementia, former smoker EXAM: PORTABLE CHEST 1 VIEW COMPARISON:  Portable exam 0945 hours compared to 05/09/2018 FINDINGS: Nasogastric tube extends into stomach. RIGHT arm PICC line tip projects over SVC. Normal heart size and pulmonary vascularity. Elongation of thoracic aorta. Lungs appear emphysematous but clear. No pleural effusion or pneumothorax. Bones demineralized. Free air identified under the hemidiaphragms bilaterally; EHR indicates patient underwent a laparoscopic procedure on 05/10/2018, small-bowel resection and lysis of adhesions for small-bowel obstruction prior operative note. IMPRESSION: COPD changes without infiltrate. Free air consistent with abdominal surgery 1 day ago. Electronically Signed   By: Ulyses Southward M.D.   On: 05/11/2018 10:21   Dg Chest Port 1 View  Result Date: 05/09/2018 CLINICAL DATA:  Cough EXAM: PORTABLE CHEST 1 VIEW COMPARISON:  05/04/2018 FINDINGS: NG tube is stable. Right upper extremity PICC placed. Tip is at the cavoatrial junction. Lungs remain hyperaerated and clear. Small right pleural effusion is stable. No pneumothorax. Aorta remains somewhat prominent due to tortuosity and rotation of the thorax. IMPRESSION: New right upper extremity PICC with its tip at the cavoatrial junction. No active cardiopulmonary disease. Electronically Signed   By: Jolaine Click M.D.   On: 05/09/2018 07:37   Dg Abd 2 Views  Result Date:  05/05/2018 CLINICAL DATA:  Vomiting.  History of right lumpectomy. EXAM: ABDOMEN - 2 VIEW COMPARISON:  05/04/2018 FINDINGS: Improve dilation of small bowel loops with residual borderline gas-filled distended small bowel loops in the left upper quadrant of the abdomen. Normal colonic bowel gas pattern. No evidence of free intra-abdominal gas. Normal cardiac silhouette.  Tortuosity of the thoracic aorta. Right pleural reflection versus small pleural effusion. No lobar airspace consolidation. Enteric catheter tip within the expected location the gastric body, side hole at the GE junction level. Right PICC line terminates in the expected location of the cavoatrial junction. IMPRESSION: Improved appearance of small-bowel obstruction. Enteric catheter with side hole at the level of the GE junction. Advancement may be considered. Possible small right pleural effusion. Electronically Signed   By: Ted Mcalpine M.D.   On: 05/05/2018 12:51   Dg Abd Acute W/chest  Result Date: 05/04/2018 CLINICAL DATA:  Follow-up small bowel obstruction. EXAM: DG ABDOMEN ACUTE W/ 1V CHEST COMPARISON:  Abdominal x-rays 05/04/1999 19 dating back to 04/29/2018. CT abdomen and pelvis 04/30/2018. Chest x-rays 05/01/2018, 04/12/2018. FINDINGS: Nasogastric tube tip in the fundus of the stomach. Persistent marked gaseous distension of the jejunum in the LEFT UPPER QUADRANT, slowly progressive over the past 3 days, with air-fluid levels on the LATERAL decubitus image. No evidence of free intraperitoneal air. Normal caliber colon with moderate stool burden. Calcified uterine fibroid in the pelvic midline. Cardiac silhouette normal in size, unchanged. Thoracic aorta tortuous and atherosclerotic. Hilar and mediastinal contours otherwise unremarkable. Interval improvement in aeration in the LEFT lung base, with only minimal patchy opacities persisting. Lungs otherwise clear. Emphysematous changes in both lungs with scarring in the RIGHT mid lung,  unchanged. Pleuroparenchymal scarring at the RIGHT lung base which accounts for the blunting of the costophrenic angle, unchanged dating back to the original 04/12/2018 examination. IMPRESSION: 1. Persistent partial small bowel obstruction which has slowly worsened over the past 3 days. 2. No evidence of free intraperitoneal air. 3. Improved aeration in the LEFT LOWER LOBE, with only mild atelectasis and/or pneumonia persisting. Electronically Signed   By: Hulan Saas M.D.   On: 05/04/2018 09:28   Dg Abd Portable 1v-small Bowel Obstruction Protocol-initial, 8 Hr Delay  Result Date: 05/29/2018 CLINICAL DATA:  8 hour delay small-bowel obstruction film. EXAM: PORTABLE ABDOMEN - 1 VIEW COMPARISON:  05/29/2018 FINDINGS: Contrast material demonstrated throughout the colon. No small or large bowel distention is identified. Enteric tube tip is in the left upper quadrant consistent with location in the upper stomach. Proximal side hole projects at or just above the expected location of the EG junction. No change in position. IMPRESSION: Contrast material throughout the colon. No evidence of bowel obstruction. Electronically Signed   By: Burman Nieves M.D.   On: 05/29/2018 23:44   Dg Abd Portable 1v  Result Date: 05/29/2018 CLINICAL DATA:  Small bowel obstruction. EXAM: PORTABLE ABDOMEN - 1 VIEW COMPARISON:  One-view abdomen 05/28/2018 and 05/27/2018. FINDINGS: Bowel gas pattern is normal. No significant dilated loops of bowel remain. There is no free air. The side port of the NG tube is above the GE junction and could be advanced for more optimal positioning. The lung bases are clear. IMPRESSION: 1. Normal bowel gas pattern. No residual dilated loops of small bowel. 2. The side port of the NG tube is above the GE junction. Electronically Signed   By: Marin Roberts M.D.   On: 05/29/2018 10:42   Dg Abd Portable 1v  Result Date: 05/28/2018 CLINICAL DATA:  Small-bowel obstruction EXAM: PORTABLE ABDOMEN  - 1 VIEW COMPARISON:  Portable exam 0746 hours compared to 05/27/2018 FINDINGS: Scattered gas and minimal stool in colon. Few air-filled loops of small bowel in the LEFT mid abdomen, decreased in prominence since previous exam. Single loop demonstrates questionable mild wall bowel wall thickening. Tip of nasogastric  tube projects over mid stomach. Bones appear demineralized. IMPRESSION: Decreased small bowel distention since prior study. Electronically Signed   By: Ulyses Southward M.D.   On: 05/28/2018 08:16   Dg Abd Portable 1v  Result Date: 05/27/2018 CLINICAL DATA:  Small bowel obstruction EXAM: PORTABLE ABDOMEN - 1 VIEW COMPARISON:  05/24/2018 FINDINGS: There is a nasogastric tube with the tip projecting over the stomach. There is mild gaseous distension of the small bowel and colon without evidence of obstruction. There is no evidence of pneumoperitoneum, portal venous gas or pneumatosis. There are no pathologic calcifications along the expected course of the ureters. The osseous structures are unremarkable. IMPRESSION: There is a nasogastric tube with the tip projecting over the stomach. There is mild gaseous distension of the small bowel and colon without evidence of obstruction. Electronically Signed   By: Elige Ko   On: 05/27/2018 09:44   Dg Abd Portable 1v  Result Date: 05/24/2018 CLINICAL DATA:  Follow-up small bowel obstruction. EXAM: PORTABLE ABDOMEN - 1 VIEW COMPARISON:  Portable supine abdominal radiograph of May 21, 2018 FINDINGS: The colonic stool burden is moderate. There are loops of mildly distended gas-filled small bowel to the left of midline. There is gas and stool in the rectum. There is no free extraluminal gas. The esophagogastric tube tip in proximal port project below the GE junction. The bony structures exhibit no acute abnormality. IMPRESSION: There are a few loops of mildly distended gas-filled small bowel to the left of midline. The colonic stool burden and gas pattern is  within the limits of normal. There is no evidence of perforation. Electronically Signed   By: Caelan  [place] M.D.   On: 05/24/2018 09:25   Dg Abd Portable 1v-small Bowel Obstruction Protocol-initial, 8 Hr Delay  Result Date: 05/21/2018 CLINICAL DATA:  Small-bowel obstruction.  Follow-up exam. EXAM: PORTABLE ABDOMEN - 1 VIEW COMPARISON:  05/20/2018 FINDINGS: Significant improvement in small bowel dilation is noted since the prior study. The small bowel obstruction has essentially resolved radiographically. Mild generalized increased stool burden in the colon, unchanged from the previous exam. Midline abdominal wall surgical staples are stable. Nasogastric tube is well positioned, tip in the mid stomach. IMPRESSION: 1. Significant interval improvement from the previous day's exam. Small-bowel obstruction appears resolved radiographically. Electronically Signed   By: Amie Portland M.D.   On: 05/21/2018 13:12   Dg Abd Portable 1v  Result Date: 05/17/2018 CLINICAL DATA:  Small-bowel obstruction. EXAM: PORTABLE ABDOMEN - 1 VIEW COMPARISON:  05/16/2018.  05/15/2018.  CT 04/30/2018. FINDINGS: Surgical staples noted over the abdomen. NG tube noted with its tip in the stomach. Distended loops of small and large bowel are again noted. Similar findings noted on prior exam. No free air. Degenerative change in osteopenia lumbar spine and both hips. Prostate calcifications are noted. Aortoiliac atherosclerotic vascular calcification. Diffuse osteopenia degenerative changes lumbar spine and both hips. Mild blunting of the right costophrenic angle again noted suggesting scarring and/or tiny effusion. IMPRESSION: NG tube noted with its tip in the stomach. Dilated loops of small and large bowel are again noted without significant interim change. No free air identified. Electronically Signed   By: Maisie Fus  Register   On: 05/17/2018 07:32   Dg Abd Portable 1v  Result Date: 05/15/2018 CLINICAL DATA:  NG tube placement EXAM:  PORTABLE ABDOMEN - 1 VIEW COMPARISON:  05/15/2018 FINDINGS: NG tube is in placed into the fundus of the stomach. Continued gaseous distention of visualized upper abdominal bowel loops. IMPRESSION: NG tube in the  fundus of the stomach Electronically Signed   By: Charlett Nose M.D.   On: 05/15/2018 20:11   Dg Abd Portable 1v  Result Date: 05/15/2018 CLINICAL DATA:  Small bowel obstruction. EXAM: PORTABLE ABDOMEN - 1 VIEW COMPARISON:  One-view abdomen 05/14/2018 FINDINGS: Patient's retained left.  Heart size normal.  Lung bases are clear. Extend loops of large and small bowel are stable. There is no free air or pneumatosis. Surgical clips are noted. IMPRESSION: 1. Similar appearance of distended large and small bowel consistent with obstruction or ileus. Electronically Signed   By: Marin Roberts M.D.   On: 05/15/2018 08:10   Dg Abd Portable 1v  Result Date: 05/14/2018 CLINICAL DATA:  Abdominal pain EXAM: PORTABLE ABDOMEN - 1 VIEW COMPARISON:  05/13/2018 FINDINGS: Interval NG tube removal. Some interval improvement in gaseous distention of the bowel. Stool and air throughout the transverse colon. Postop staples again noted. Air in the rectum. No significant interval change. Degenerative changes of the spine. Basilar chronic interstitial changes and atelectasis. IMPRESSION: NG tube removed. Stable nonspecific bowel gas pattern, suspect resolving obstruction or ileus. Little interval change. Electronically Signed   By: Judie Petit.  Shick M.D.   On: 05/14/2018 10:23   Dg Abd Portable 1v  Result Date: 05/13/2018 CLINICAL DATA:  Abdominal pain, check NG placement EXAM: PORTABLE ABDOMEN - 1 VIEW COMPARISON:  05/09/2018 FINDINGS: Nasogastric catheter is noted with the tip in the stomach. Proximal side port lies in the distal esophagus. This is stable from the prior exam. Scattered large and small bowel gas is noted. Postsurgical changes are now seen. Mild small bowel dilatation is noted which may be related to a  postoperative ileus. IMPRESSION: Mild postoperative ileus. Nasogastric catheter as described. Electronically Signed   By: Alcide Clever M.D.   On: 05/13/2018 09:56   Dg Abd Portable 1v  Result Date: 05/09/2018 CLINICAL DATA:  Small-bowel obstruction EXAM: PORTABLE ABDOMEN - 1 VIEW COMPARISON:  05/08/2018; 05/06/2018; 05/05/2018 FINDINGS: Re-demonstrated gaseous distention of several loops of small bowel with index loop of small bowel within the left mid hemiabdomen measuring 3.6 cm in diameter. Moderate colonic stool burden. No supine evidence of pneumoperitoneum. No pneumatosis or portal venous gas. Presumed calcified fibroid overlies the left hemipelvis. Stable position of support apparatus. No acute osseus abnormalities. IMPRESSION: No change to slight improvement in suspected small-bowel obstruction. Electronically Signed   By: Simonne Come M.D.   On: 05/09/2018 07:47   Dg Abd Portable 1v  Result Date: 05/08/2018 CLINICAL DATA:  Follow up small bowel obstruction EXAM: PORTABLE ABDOMEN - 1 VIEW COMPARISON:  05/06/2018 FINDINGS: Scattered large and small bowel gas is noted. Multiple dilated loops of small bowel are again identified and stable in appearance. Nasogastric catheter is again noted within the stomach. No free air is seen. Degenerative changes of the lumbar spine are noted. IMPRESSION: Persistent small bowel dilatation. No new focal abnormality is noted. Electronically Signed   By: Alcide Clever M.D.   On: 05/08/2018 08:36   Dg Abd Portable 1v  Result Date: 05/06/2018 CLINICAL DATA:  Small bowel obstruction EXAM: PORTABLE ABDOMEN - 1 VIEW COMPARISON:  Portable exam at 0628 hours compared to 05/05/2018 FINDINGS: Stool scattered throughout colon. Persistent dilatation of small bowel loops in the LEFT mid abdomen up to 4.9 cm diameter. No bowel wall thickening. Bones demineralized. No definite renal calcifications. IMPRESSION: Persistent small bowel dilatation consistent with small bowel  obstruction. Little interval change. Electronically Signed   By: Ulyses Southward M.D.   On:  05/06/2018 10:21   Dg Abd Portable 1v-small Bowel Obstruction Protocol-initial, 8 Hr Delay  Result Date: 05/04/2018 CLINICAL DATA:  Small bowel obstruction protocol. 8 hour delay. EXAM: PORTABLE ABDOMEN - 1 VIEW COMPARISON:  05/03/2018 FINDINGS: Gaseous distention of left upper quadrant small bowel. Enteric tube with tip projected over the mid abdomen consistent with location in the upper stomach. Contrast material is demonstrated in the dilated small bowel. No contrast material is identified in the colon. This is suggestive of high-grade obstruction. Degenerative changes in the spine. IMPRESSION: Contrast material is demonstrated in the dilated small bowel but not in the colon consistent with high-grade obstruction. Electronically Signed   By: Burman Nieves M.D.   On: 05/04/2018 01:55   [place] Ekg Site Rite  Result Date: 05/04/2018 If Site Rite image not attached, placement could not be confirmed due to current cardiac rhythm.    LOS: 33 days   Signature  Jeoffrey Massed M.D on 06/01/2018 at 12:32 PM   -  To page go to [email or web address] - password [facility]

## 2018-06-01 NOTE — Progress Notes (Signed)
Physical Therapy Treatment Patient Details Name: Jeffrey Davila MRN: [id number] DOB: 1950-09-21 Today's Date: 06/01/2018    History of Present Illness Pt is a 67 y/o male admitted secondary to AMS and Sepsis most likely secondary to UTI. CT negative for acute abnormality. Work up during hospitalizations shows, aspiration PNA, SBO, acute metabolic encephalopathy, anemia, AKI, severe malnutrition, deconditioning/debility, and hyponatremia.  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  PMH inlcudes BI, dementia, HTN, and schizophrenia.     PT Comments    Pt was in chair upon arrival with no family present and requested to be transferred back to bed. Pt was cheerful at beginning of treatment, singing a song to show off his voice, but became easily distracted and agitated as treatment progressed. Pt participated in heel slides in supine position but when asked to do other exercises he would pull LE up into the fetal position when he decided he didn't want to participate. Continue to work on LE strengthening, balance and gt as pt tolerates. Pt continues to make progress toward stated goals and would benefit from continued PT in order to maximize function. He still seems appropriate for d/c to SNF based on current functional status.     Follow Up Recommendations  SNF;Supervision/Assistance - 24 hour     Equipment Recommendations  Other (comment)    Recommendations for Other Services       Precautions / Restrictions Precautions Precautions: Fall Precaution Comments: NG tube Restrictions Weight Bearing Restrictions: No    Mobility  Bed Mobility Overal bed mobility: Needs Assistance Bed Mobility: Sit to Sidelying         Sit to sidelying: Mod assist General bed mobility comments: Pt required cueing and assist to bring LE into bed and use of bed pad to position pt. Pt was apprehensive with fear of falling once on EOB.    Transfers Overall transfer level: Needs assistance Equipment used: None Transfers: Sit to/from [employer] Sit to Stand: Min assist(two trials from chair) Stand pivot transfers: Mod assist;+2 safety/equipment;+2 physical assistance       General transfer comment: Min A to power up into standing and then Mod A to pivot towards bed. Face to face transfer used.  Ambulation/Gait Ambulation/Gait assistance: Mod assist;+2 physical assistance Gait Distance (Feet): (3-4 steps) Assistive device: (bil UE holding on to therapists arms)       General Gait Details: Pt required VC for foot placement.   Stairs             Wheelchair Mobility    Modified Rankin (Stroke Patients Only)       Balance Overall balance assessment: Needs assistance Sitting-balance support: No upper extremity supported;Feet supported Sitting balance-Leahy Scale: Fair     Standing balance support: Bilateral upper extremity supported Standing balance-Leahy Scale: Poor                              Cognition Arousal/Alertness: Awake/alert Behavior During Therapy: WFL for tasks assessed/performed Overall Cognitive Status: History of cognitive impairments - at baseline                                 General Comments: Pt requested to get back in the bed upon arrival.       Exercises Total Joint Exercises Ankle Circles/Pumps: 5 reps;Right;Left;AROM;Supine(Attempted but pt got aggrivated and refused )  Heel Slides: AROM;10 reps;Right;Left;Supine    General Comments        Pertinent Vitals/Pain Faces Pain Scale: Hurts a little bit    Home Living                      Prior Function            PT Goals (current goals can now be found in the care plan section) Acute Rehab PT Goals Patient Stated Goal: Sister "to go to rehab and then home." PT Goal Formulation: With patient/family Time For Goal Achievement: 05/30/18 Potential to Achieve  Goals: Fair Progress towards PT goals: Progressing toward goals    Frequency    Min 2X/week      PT Plan Current plan remains appropriate    Co-evaluation              AM-PAC PT "6 Clicks" Daily Activity  Outcome Measure  Difficulty turning over in bed (including adjusting bedclothes, sheets and blankets)?: Unable Difficulty moving from lying on back to sitting on the side of the bed? : Unable Difficulty sitting down on and standing up from a chair with arms (e.g., wheelchair, bedside commode, etc,.)?: A Lot Help needed moving to and from a bed to chair (including a wheelchair)?: A Lot Help needed walking in hospital room?: Total Help needed climbing 3-5 steps with a railing? : Total 6 Click Score: 8    End of Session Equipment Utilized During Treatment: Gait belt Activity Tolerance: Patient limited by fatigue Patient left: in bed;with call bell/phone within reach;with bed alarm set Nurse Communication: Mobility status PT Visit Diagnosis: Unsteadiness on feet (R26.81);Muscle weakness (generalized) (M62.81)     Time: 4098-1191 PT Time Calculation (min) (ACUTE ONLY): 16 min  Charges:  $Therapeutic Activity: 8-22 mins           [address], SPTA           [place] 06/01/2018, 5:44 PM

## 2018-06-01 NOTE — Progress Notes (Signed)
PHARMACY - ADULT TOTAL PARENTERAL NUTRITION CONSULT NOTE   Pharmacy Consult: TPN Indication: SBO  Patient Measurements: Height: 5\' 6"  (167.6 cm) Weight: 102 lb 6.4 oz (46.4 kg) IBW/kg (Calculated) : 63.8 TPN AdjBW (KG): 46.4 Body mass index is 16.53 kg/m.  Assessment:  76 YOM admitted on 10/4 with sepsis. Found to have PNA and a possible SBO on 10/5 CT. Pt has a history of SBO s/p colectomy in 1993 (history (in the 1990s) of trauma causing small bowel injury requiring laparotomy). An NG tube was placed with improving small bowel dilatation but patient had 3-4 episodes of vomiting overnight on 10/7-10/8 and the NG tube had to be replaced due to incorrect placement. Pharmacy now consulted to manage TPN due to worsening SBO and concern for adhesions that require surgery.  Actual body weight is significantly below ideal body weight.  Of note, patient is extremely cachectic. Patient hx unreliable due to mental status.  GI: s/p ex-lap with LOA, SBR and anastomosis on 10/15. 11/3 Xray shows no evidence of bowel obstruction. NGT d/c 11/4. Change to dysphagia 1 diet 11/6 with tolerating clear liquids, feels hungry. Noted pt prealbumin up to 34.4. Miralax/dulcolax supp daily Endo: No hx DM - CBGs controlled (running low in 70-90s), SSI d/c'd 10/14 Lytes: WNL  Renal: SCr stable wnl, BUN down to 41 - UOP not recorded accurately, LR at [facility] Pulm: RA Cards: BP low-wnl Hepatobil: LFTs / tbili / TG WNL Neuro: Schizophrenia/dementia. Benztropine, Zyprexa, IV valproate. Can't really make appropriate decisions ID: s/p abx for Klebsiella UTI and aspiration PNA - afebrile, WBC WNL TPN Access: double lumen PICC placed 05/05/18 TPN start date: 05/05/18  Nutritional Goals (per RD rec on 11/4): 1650-1850kCal, 85-100 gm protein, >1.6 L fluid per day  Current Nutrition:  TPN Dysphagia 1 diet Prostat BID + Boost Plus BID - all doses charted 11/5  Plan:  Wean TPN to 35 ml/hr, providing 46g AA, 118g CHO and  25g ILE for a total of 836 kCals per day, meeting 54% of patient protein and 51% of Kcal needs. Plan to d/c TPN tomorrow if taking in enough PO per Surgery Electrolytes in TPN: increased Mag, Cl:Ac 1:1 MVI, trace elements, Pepcid 20mg  in TPN F/U TPN labs Mon and Thur F/u PO intake and ability to wean TPN  Babs Bertin, PharmD, BCPS Clinical Pharmacist Clinical phone [phone] Please check AMION for all [facility] contact numbers 06/01/2018 9:20 AM

## 2018-06-01 NOTE — Progress Notes (Signed)
CSW, RNCM, and Charge RN spoke with patient' sister on the phone. She was very pleasant and pleased that the patient may be able to discharge to SNF tomorrow. She reported that she thinks she would like [facility], but will confirm. CSW left another list in the room just in case she cannot find the previous list. CSW reached out to [facility] to see if they can accept the patient.   [name] Laurens Matheny LCSW [phone]

## 2018-06-01 NOTE — Progress Notes (Signed)
  Speech Language Pathology Treatment: Dysphagia  Patient Details Name: Jeffrey Davila MRN: [id number] DOB: [birth year] Today's Date: 06/01/2018 Time: 4098-1191 SLP Time Calculation (min) (ACUTE ONLY): 16 min  Assessment / Plan / Recommendation Clinical Impression  Patient sitting in chair resting comfortably. He reported pain in his stomach to be burning (6), however he was willing to try some applesauce and water. Notified nurse of his reported pain. Patient tolerated pureed with thin liquids with no s/s of aspiration. His oral transfer and swallow initiation was timely. No oral holding and clear vocal quality. Recommend continuing Dys 1, thin liquid diet and full assistance for meals to assist with self feeding, encouragement for intake and to monitor for oral holding (verbal cueing). Speech therapy to continue to follow for diet tolerance, education, aspiration precaution training.    HPI HPI: Pt is a 67 y/o male admitted secondary to AMS and Sepsis most likely secondary to UTI. CT negative for acute abnormality. Work up during hospitalizations showed aspiration PNA, SBO, acute metabolic encephalopathy, anemia, AKI, severe malnutrition, deconditioning/debility, and hyponatremia.  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 Pt has had NGT in/out throughout admission, at time of SLP swallow eval it was in place but clamped. PMH inlcudes BI, dementia, HTN, and schizophrenia.       SLP Plan  Continue with current plan of care       Recommendations  Diet recommendations: Dysphagia 1 (puree);Thin liquid Liquids provided via: Straw;Cup Medication Administration: Crushed with puree Supervision: Staff to assist with self feeding;Full supervision/cueing for compensatory strategies Compensations: Minimize environmental distractions;Slow rate;Small sips/bites;Lingual sweep for clearance of pocketing;Follow solids with liquid Postural Changes  and/or Swallow Maneuvers: Seated upright 90 degrees;Upright 30-60 min after meal                Oral Care Recommendations: Oral care QID Follow up Recommendations: Skilled Nursing facility SLP Visit Diagnosis: Dysphagia, unspecified (R13.10) Plan: Continue with current plan of care       GO               Lindalou Hose Firman Petrow, MA, CCC-SLP 06/01/2018 9:51 AM

## 2018-06-01 NOTE — Progress Notes (Signed)
[facility] Progress Note  22 Days Post-Op  Subjective: CC-  Patient up in chair. Shakes his head "no" when asked if he has any abdominal pain. Per RN he said that he was hungry yesterday. Tolerating clear liquids. No more BMs yesterday.  Objective: Vital signs in last 24 hours: Temp:  [97.6 F (36.4 C)-98.3 F (36.8 C)] 98.3 F (36.8 C) (11/06 0531) Pulse Rate:  [73-79] 79 (11/06 0531) Resp:  [16-18] 18 (11/06 0531) BP: (92-98)/(60-61) 98/61 (11/06 0531) SpO2:  [100 %] 100 % (11/06 0531) Last BM Date: 05/30/18  Intake/Output from previous day: 11/05 0701 - 11/06 0700 In: 1843.3 [P.O.:110; I.V.:1510; IV Piggyback:223.3] Out: 400 [Urine:400] Intake/Output this shift: No intake/output data recorded.  PE: Gen: Alert, NAD Pulm: effort normal Abd: Soft, NT/ND, +BS, no HSM,incisions cdi with steri strips in place Skin: no rashes noted, warm and dry  Lab Results:  Recent Labs    05/30/18 0353  WBC 9.7  HGB 9.9*  HCT 31.8*  PLT 284   BMET Recent Labs    05/30/18 1002 05/31/18 0317  NA 140 139  K 4.0 4.0  CL 111 111  CO2 24 24  GLUCOSE 101* 90  BUN 42* 41*  CREATININE 0.64 0.66  CALCIUM 9.3 9.0   PT/INR No results for input(s): LABPROT, INR in the last 72 hours. CMP     Component Value Date/Time   NA 139 05/31/2018 0317   K 4.0 05/31/2018 0317   CL 111 05/31/2018 0317   CO2 24 05/31/2018 0317   GLUCOSE 90 05/31/2018 0317   BUN 41 (H) 05/31/2018 0317   CREATININE 0.66 05/31/2018 0317   CALCIUM 9.0 05/31/2018 0317   PROT 7.4 05/30/2018 1002   ALBUMIN 2.7 (L) 05/30/2018 1002   AST 11 (L) 05/30/2018 1002   ALT 10 05/30/2018 1002   ALKPHOS 104 05/30/2018 1002   BILITOT 0.4 05/30/2018 1002   GFRNONAA >60 05/31/2018 0317   GFRAA >60 05/31/2018 0317   Lipase     Component Value Date/Time   LIPASE 204 (H) 04/29/2018 0904       Studies/Results: No results found.  Anti-infectives: Anti-infectives (From admission, onward)   Start     Dose/Rate Route Frequency Ordered Stop   05/10/18 0941  ceFAZolin (ANCEF) 2-4 GM/100ML-% IVPB    Note to Pharmacy:  [name]   : cabinet override      05/10/18 0941 05/10/18 2144   05/01/18 1215  metroNIDAZOLE (FLAGYL) IVPB 500 mg  Status:  Discontinued     500 mg 100 mL/hr over 60 Minutes Intravenous Every 8 hours 05/01/18 1201 05/06/18 1153   04/30/18 1100  cefTRIAXone (ROCEPHIN) 2 g in sodium chloride 0.9 % 100 mL IVPB  Status:  Discontinued     2 g 200 mL/hr over 30 Minutes Intravenous Every 24 hours 04/29/18 1416 05/06/18 1153   04/29/18 1430  cefTRIAXone (ROCEPHIN) 1 g in sodium chloride 0.9 % 100 mL IVPB     1 g 200 mL/hr over 30 Minutes Intravenous Every 24 hours 04/29/18 1416 04/30/18 1429   04/29/18 1130  cefTRIAXone (ROCEPHIN) 1 g in sodium chloride 0.9 % 100 mL IVPB     1 g 200 mL/hr over 30 Minutes Intravenous  Once 04/29/18 1118 04/29/18 1251       Assessment/Plan Hx of prior lung surgery Acute metabolic encephalopathy Anemia- transfused 10/14 -hg stable (11/4) AKI- resolved Hypertension- controlled Hypokalemia/Hyponatremia- resolved Hx of schizophrenia- cogentin/Zyprexa - very difficult to get answers from him  Deconditioning - bedbound Malnutrition - severe- TNA. 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 Wean TPN to 1/2 today and plan to d/c tomorrow if taking in enough PO. Continue bowel regimen with daily miralax and dulcolax suppository.   ID -rocephin 10/4>>10/11, flagyl 10/6>>10/11 FEN -TPN, dysphagia 1 VTE -SCDs, SQ heparin Foley -condom cath   LOS: 33 days    Franne Forts , [facility] 06/01/2018, 9:05 AM Pager: [phone] Mon 7:00 am -11:30 AM Tues-Fri  7:00 am-4:30 pm Sat-Sun 7:00 am-11:30 am

## 2018-06-02 LAB — MAGNESIUM: Magnesium: 1.8 mg/dL (ref 1.7–2.4)

## 2018-06-02 LAB — COMPREHENSIVE METABOLIC PANEL
ALBUMIN: 2.5 g/dL — AB (ref 3.5–5.0)
ALK PHOS: 89 U/L (ref 38–126)
ALT: 11 U/L (ref 0–44)
ANION GAP: 5 (ref 5–15)
AST: 15 U/L (ref 15–41)
BUN: 28 mg/dL — ABNORMAL HIGH (ref 8–23)
CALCIUM: 8.8 mg/dL — AB (ref 8.9–10.3)
CHLORIDE: 111 mmol/L (ref 98–111)
CO2: 22 mmol/L (ref 22–32)
Creatinine, Ser: 0.65 mg/dL (ref 0.61–1.24)
GFR calc Af Amer: >60 mL/min (ref >60)
GFR calc non Af Amer: >60 mL/min (ref >60)
GLUCOSE: 87 mg/dL (ref 70–99)
Potassium: 4.1 mmol/L (ref 3.5–5.1)
SODIUM: 138 mmol/L (ref 135–145)
Total Bilirubin: 0.3 mg/dL (ref 0.3–1.2)
Total Protein: 6.2 g/dL — ABNORMAL LOW (ref 6.5–8.1)

## 2018-06-02 LAB — GLUCOSE, CAPILLARY
Glucose-Capillary: 88 mg/dL (ref 70–99)
Glucose-Capillary: 91 mg/dL (ref 70–99)
Glucose-Capillary: 94 mg/dL (ref 70–99)

## 2018-06-02 LAB — PHOSPHORUS: PHOSPHORUS: 3.2 mg/dL (ref 2.5–4.6)

## 2018-06-02 MED ORDER — BOOST / RESOURCE BREEZE PO LIQD CUSTOM
1.0000 | Freq: Three times a day (TID) | ORAL | Status: DC
  Administered 2018-06-02 – 2018-06-03 (×3): 1 via ORAL

## 2018-06-02 MED ORDER — TRAVASOL 10 % IV SOLN
INTRAVENOUS | Status: DC
  Administered 2018-06-02: 17:00:00 via INTRAVENOUS
  Filled 2018-06-02: qty 462

## 2018-06-02 MED ORDER — SENNOSIDES-DOCUSATE SODIUM 8.6-50 MG PO TABS
2.0000 | ORAL_TABLET | Freq: Every day | ORAL | Status: DC
  Administered 2018-06-02: 2 via ORAL
  Filled 2018-06-02: qty 2

## 2018-06-02 NOTE — Progress Notes (Signed)
Pt. Ambulated in room a few times today.

## 2018-06-02 NOTE — Progress Notes (Signed)
Pt. Did well at breakfast.  He ate about 50% of his food.  He drank 2 orange juices.  And about half of a glucerna.  Pt. Began to say he didn't want anymore because he was full so we stopped feeding him.

## 2018-06-02 NOTE — Progress Notes (Signed)
Pt did well in chair today.  Stayed most of the day in chair.  From 9am to 4pm.

## 2018-06-02 NOTE — Progress Notes (Signed)
PHARMACY - ADULT TOTAL PARENTERAL NUTRITION CONSULT NOTE   Pharmacy Consult: TPN Indication: SBO  Patient Measurements: Height: 5\' 6"  (167.6 cm) Weight: 102 lb 6.4 oz (46.4 kg) IBW/kg (Calculated) : 63.8 TPN AdjBW (KG): 46.4 Body mass index is 16.53 kg/m.  Assessment:  29 YOM admitted on 10/4 with sepsis. Found to have PNA and a possible SBO on 10/5 CT. Pt has a history of SBO s/p colectomy in 1993 (history (in the 1990s) of trauma causing small bowel injury requiring laparotomy). An NG tube was placed with improving small bowel dilatation but patient had 3-4 episodes of vomiting overnight on 10/7-10/8 and the NG tube had to be replaced due to incorrect placement. Pharmacy now consulted to manage TPN due to worsening SBO and concern for adhesions that require surgery.  Actual body weight is significantly below ideal body weight.  Of note, patient is extremely cachectic. Patient hx unreliable due to mental status.  GI: s/p ex-lap with LOA, SBR and anastomosis on 10/15. 11/3 Xray shows no evidence of bowel obstruction. NGT d/c 11/4. Change to dysphagia 1 diet 11/6, feels hungry - eating well 11/7. Unfortunately intake not charted 11/6 and both today's RN and wife were not with patient yesterday. Noted pt prealbumin up to 34.4. Miralax/dulcolax supp daily Endo: No hx DM - CBGs controlled (running low in 70-90s), SSI d/c'd 10/14 Lytes: WNL  Renal: SCr stable wnl, BUN down to 28 - UOP not recorded accurately Pulm: RA Cards: BP low Hepatobil: LFTs / tbili / TG WNL Neuro: Schizophrenia/dementia. Benztropine, Zyprexa, IV valproate. Can't really make appropriate decisions ID: s/p abx for Klebsiella UTI and aspiration PNA - afebrile, WBC WNL TPN Access: double lumen PICC placed 05/05/18 TPN start date: 05/05/18  Nutritional Goals (per RD rec on 11/4): 1650-1850kCal, 85-100 gm protein, >1.6 L fluid per day  Current Nutrition:  TPN Dysphagia 1 diet - intake not charted, RN reports eating well  this AM with wife in room Prostat BID + Boost Plus TID (2 doses charted 11/6)  Plan:  Continue TPN weaned to 35 ml/hr for today per Surgery, providing 46g AA, 118g CHO and 25g ILE for a total of 836 kCals per day, meeting 54% of patient protein and 51% of Kcal needs Electrolytes in TPN: continue same, Cl:Ac 1:1 MVI, trace elements, Pepcid 20mg  in TPN F/U TPN labs Mon and Thur F/u PO intake and ability to wean TPN - Plan to d/c tomorrow if taking in enough PO per Surgery  Babs Bertin, PharmD, BCPS Clinical Pharmacist Clinical phone [phone] Please check AMION for all [facility] Pharmacy contact numbers 06/02/2018 9:46 AM

## 2018-06-02 NOTE — Progress Notes (Signed)
Patient's sister has requested [facility]. They are able to accept patient once off of TPN.   [name] Cardin Nitschke LCSW [phone]

## 2018-06-02 NOTE — Progress Notes (Signed)
PROGRESS NOTE        PATIENT DETAILS Name: Jeffrey Davila Age: 67 y.o. Sex: male Date of Birth: [birth year] Admit Date: 04/29/2018 Admitting Physician Starleen Arms, MD PCP:Uba, Reuel Boom, MD  Brief Narrative: Patient is a 67 y.o. male with history of schizophrenia/?  Cognitive dysfunction (dementia), hypertension, BPH, remote history (in the 1990s) of trauma causing small bowel injury requiring laparotomy-mostly wheelchair-bound-presented to the hospital with approximately 1 week history of intermittent vomiting, confusion for a few days prior to this hospital admission-presented with sepsis secondary to UTI, vomiting and significant constipation.  Patient was started on IV antibiotics and admitted to the hospital service,initial imaging was suggestive of constipation causing gastric distention-NG tube was placed, however upon further evaluation with a CT scan he was found  to have a small bowel obstruction.  General surgery was consulted, even with n.p.o. status/NG tube decompression-he continues to have SBO-patient's sister has refused to sign consent for surgery and wanted patient to receive several days of TNA before she consented to surgery.  Subsequently underwent laparotomy with lysis of adhesions on 10/15, postoperative course complicated by ileus which seems to finally have resolved on 05/30/2018.  See below for details.  Subjective:  No major events overnight-per nursing staff-tolerated significant amount of oral intake this morning.  No vomiting.  No abdominal pain.  Assessment/Plan: Small bowel obstruction: Underwent laparotomy with lysis of adhesions 05/10/2018 - postoperative course complicated by ileus, recurrent bowel obstruction and constipation.  He is conservatively with bowel rest and numerous enemas.  Much improved-NG tube removed on 11/4-tolerating a dysphagia 1 diet.  Cautiously continue with TNA-if patient continues to consume significant amount  of his calories orally-suspect PNA can be discontinued on 11/8.  General surgery continues to follow.   Sepsis secondary to complicated UTI: Sepsis pathophysiology has resolved, urine culture positive for Klebsiella-completed a course of IV Rocephin.  Blood cultures remain negative   Aspiration pneumonia: This occurred in the early part of his hospitalization-this is secondary recurrent vomiting the setting of SBO underlying frailty.  Has completed a course of antimicrobial therapy.  Continue to mobilize.    Acute metabolic encephalopathy: Resolved-secondary to UTI/SBO.  Back to baseline.   Anemia: Suspect anemia secondary to acute illness and IV fluid dilution. Has been transfused a total of 3 units of PRBC so far-last was on 10/20.  Last hemoglobin stable at 9.9.  AKI: Hemodynamically mediated-resolved.  Schizophrenia: Appears stable-continue Depakote and olanzapine.    Hypertension: Blood pressure appears stable-continue to hold antihypertensives.   Severe malnutrition: Continue supplements.  Deconditioning/debility: Suspect has significant amount of debility at baseline-he is very cachectic-deconditioning/debility has worsened due to acute illness/SBO.  PT following.  Plans are for SNF on discharge.  DVT Prophylaxis:  Prophylactic Heparin   Code Status: Full code   Family Communication: None at bedside  Disposition Plan:  SNF tomorrow if clinical improvement continues  Antimicrobial agents:  Anti-infectives (From admission, onward)   Start     Dose/Rate Route Frequency Ordered Stop   05/10/18 0941  ceFAZolin (ANCEF) 2-4 GM/100ML-% IVPB    Note to Pharmacy:  [name]   : cabinet override      05/10/18 0941 05/10/18 2144   05/01/18 1215  metroNIDAZOLE (FLAGYL) IVPB 500 mg  Status:  Discontinued     500 mg 100 mL/hr over 60 Minutes Intravenous Every 8 hours  05/01/18 1201 05/06/18 1153   04/30/18 1100  cefTRIAXone (ROCEPHIN) 2 g in sodium chloride 0.9 % 100 mL IVPB   Status:  Discontinued     2 g 200 mL/hr over 30 Minutes Intravenous Every 24 hours 04/29/18 1416 05/06/18 1153   04/29/18 1430  cefTRIAXone (ROCEPHIN) 1 g in sodium chloride 0.9 % 100 mL IVPB     1 g 200 mL/hr over 30 Minutes Intravenous Every 24 hours 04/29/18 1416 04/30/18 1429   04/29/18 1130  cefTRIAXone (ROCEPHIN) 1 g in sodium chloride 0.9 % 100 mL IVPB     1 g 200 mL/hr over 30 Minutes Intravenous  Once 04/29/18 1118 04/29/18 1251      Procedures: None  CONSULTS:  None  Time spent: 25 minutes   MEDICATIONS: Scheduled Meds: . benztropine  1 mg Oral BID  . Chlorhexidine Gluconate Cloth  6 each Topical 2 times per day on Tue  . feeding supplement  1 Container Oral TID BM  . feeding supplement (PRO-STAT SUGAR FREE 64)  30 mL Oral TID WC  . heparin  5,000 Units Subcutaneous Q8H  . lactose free nutrition  237 mL Oral TID WC  . lip balm  1 application Topical BID  . OLANZapine zydis  10 mg Oral BID  . polyethylene glycol  17 g Oral Daily  . polyvinyl alcohol  1 drop Both Eyes TID   Continuous Infusions: . sodium chloride Stopped (05/31/18 0506)  . TPN ADULT (ION) 35 mL/hr at 06/01/18 1643  . TPN ADULT (ION)    . valproate sodium 250 mg (06/02/18 1025)   PRN Meds:.sodium chloride, acetaminophen, albuterol, alum & mag hydroxide-simeth, diphenhydrAMINE, magic mouthwash, menthol-cetylpyridinium, metoprolol tartrate, ondansetron (ZOFRAN) IV, phenol, sodium chloride flush   PHYSICAL EXAM: Vital signs: Vitals:   06/01/18 0531 06/01/18 1330 06/01/18 2116 06/02/18 0520  BP: 98/61 (!) 84/58 (!) 88/60 (!) 89/59  Pulse: 79 68 78 81  Resp: 18 18 18 17   Temp: 98.3 F (36.8 C) 98.1 F (36.7 C) 98.4 F (36.9 C) 97.8 F (36.6 C)  TempSrc: Oral Oral Oral Oral  SpO2: 100% 100% 100% 99%  Weight:      Height:       Filed Weights   04/29/18 0842 05/30/18 1129  Weight: 49.9 kg 46.4 kg   Body mass index is 16.53 kg/m.   Exam General appearance:Awake, alert Eyes:no scleral  icterus. HEENT: Atraumatic and Normocephalic Neck: supple, no JVD. Resp:Good air entry bilaterally,no rales or rhonchi CVS: S1 S2 regular, no murmurs.  GI: Bowel sounds present, Non tender and not distended with no gaurding, rigidity or rebound. Extremities: B/L Lower Ext shows no edema, both legs are warm to touch Neurology:  Non focal Musculoskeletal:No digital cyanosis Skin:No Rash, warm and dry Wounds:N/A    I have personally reviewed following labs and imaging studies  LABORATORY DATA: CBC: Recent Labs  Lab 05/29/18 0331 05/30/18 0353  WBC 8.5 9.7  NEUTROABS  --  6.5  HGB 8.8* 9.9*  HCT 30.8* 31.8*  MCV 96.0 93.8  PLT 295 284    Basic Metabolic Panel: Recent Labs  Lab 05/27/18 0402 05/28/18 0443 05/29/18 0331 05/30/18 0353 05/30/18 1002 05/31/18 0317 06/02/18 0506  NA 137 138 137  --  140 139 138  K 4.5 4.6 4.3  --  4.0 4.0 4.1  CL 107 110 110  --  111 111 111  CO2 25 24 22   --  24 24 22   GLUCOSE 99 94 97  --  101* 90 87  BUN 58* 49* 41*  --  42* 41* 28*  CREATININE 0.80 0.79 0.74  --  0.64 0.66 0.65  CALCIUM 9.3 9.3 9.1  --  9.3 9.0 8.8*  MG 2.5* 2.0 1.7  --  1.9 2.0 1.8  PHOS 4.1  --   --  3.9  --   --  3.2    GFR: Estimated Creatinine Clearance: 59.6 mL/min (by C-G formula based on SCr of 0.65 mg/dL).  Liver Function Tests: Recent Labs  Lab 05/30/18 1002 06/02/18 0506  AST 11* 15  ALT 10 11  ALKPHOS 104 89  BILITOT 0.4 0.3  PROT 7.4 6.2*  ALBUMIN 2.7* 2.5*   No results for input(s): LIPASE, AMYLASE in the last 168 hours. No results for input(s): AMMONIA in the last 168 hours.  Coagulation Profile: No results for input(s): INR, PROTIME in the last 168 hours.  Cardiac Enzymes: No results for input(s): CKTOTAL, CKMB, CKMBINDEX, TROPONINI in the last 168 hours.  BNP (last 3 results) No results for input(s): PROBNP in the last 8760 hours.  HbA1C: No results for input(s): HGBA1C in the last 72 hours.  CBG: Recent Labs  Lab  05/31/18 1716 06/01/18 0820 06/01/18 1653 06/02/18 0030 06/02/18 0822  GLUCAP 78 79 70 88 91    Lipid Profile: No results for input(s): CHOL, HDL, LDLCALC, TRIG, CHOLHDL, LDLDIRECT in the last 72 hours.  Thyroid Function Tests: No results for input(s): TSH, T4TOTAL, FREET4, T3FREE, THYROIDAB in the last 72 hours.  Anemia Panel: No results for input(s): VITAMINB12, FOLATE, FERRITIN, TIBC, IRON, RETICCTPCT in the last 72 hours.  Urine analysis:    Component Value Date/Time   COLORURINE YELLOW 05/15/2018 1109   APPEARANCEUR CLEAR 05/15/2018 1109   LABSPEC 1.013 05/15/2018 1109   PHURINE 7.0 05/15/2018 1109   GLUCOSEU NEGATIVE 05/15/2018 1109   HGBUR SMALL (A) 05/15/2018 1109   BILIRUBINUR NEGATIVE 05/15/2018 1109   KETONESUR NEGATIVE 05/15/2018 1109   PROTEINUR NEGATIVE 05/15/2018 1109   UROBILINOGEN 1.0 06/08/2010 1523   NITRITE NEGATIVE 05/15/2018 1109   LEUKOCYTESUR TRACE (A) 05/15/2018 1109    Sepsis Labs: Lactic Acid, Venous    Component Value Date/Time   LATICACIDVEN 1.07 04/29/2018 1334    MICROBIOLOGY: No results found for this or any previous visit (from the past 240 hour(s)).  RADIOLOGY STUDIES/RESULTS: Dg Abd 1 View  Result Date: 05/20/2018 CLINICAL DATA:  NG tube adjustment EXAM: ABDOMEN - 1 VIEW COMPARISON:  05/20/2018 FINDINGS: Esophageal tube tip and side-port project over gastric fundus, somewhat abrupt appearing curvature at the distal tip with persistent gaseous dilatation. Continued marked gaseous enlargement of bowel in the left abdomen up to 5 cm. Moderate stool in the colon. Residual contrast within the bladder. IMPRESSION: 1. Esophageal tube tip and side port overlie the gastric fundus, somewhat abrupt curvature of the tip, could be due to mild kinking. 2. Persistent dilatation of stomach and left abdominal small bowel consistent with a bowel obstruction. Electronically Signed   By: Jasmine Pang M.D.   On: 05/20/2018 18:37   Dg Abd 1  View  Result Date: 05/20/2018 CLINICAL DATA:  NG tube placement. EXAM: ABDOMEN - 1 VIEW COMPARISON:  CT abdomen pelvis from same day. FINDINGS: Tip of the NG tube in the stomach with the proximal side port at the gastroesophageal junction. Dilated loops of small bowel in the left abdomen are unchanged. Contrast is seen within the renal collecting systems and bladder. No acute osseous abnormality. IMPRESSION: 1. NG tube  tip in the stomach with the proximal side port at the gastroesophageal junction. Consider advancing 2-3 cm. 2. Unchanged small bowel obstruction. Electronically Signed   By: Obie Dredge M.D.   On: 05/20/2018 16:47   Dg Abd 1 View  Result Date: 05/16/2018 CLINICAL DATA:  Feeding tube advanced today. EXAM: ABDOMEN - 1 VIEW COMPARISON:  Plain films of the abdomen dated 05/15/2018 and 05/14/2018. FINDINGS: Enteric tube is coiled in the stomach with tip at the level of the gastric fundus/cardia. Distended gas-filled loops of large and small bowel are again appreciated throughout the abdomen and upper pelvis. IMPRESSION: 1. Feeding tube is coiled in the stomach with tip at the level of the gastric fundus/cardia. 2. Persistent gaseous distention of both large and small bowel loops throughout the abdomen and pelvis, compatible with previous reports of obstruction or ileus. Electronically Signed   By: Bary Richard M.D.   On: 05/16/2018 20:26   Ct Abdomen Pelvis W Contrast  Result Date: 05/20/2018 CLINICAL DATA:  Nausea and vomiting. 10 days status post laparoscopic lysis of adhesions and bowel resection. EXAM: CT ABDOMEN AND PELVIS WITH CONTRAST TECHNIQUE: Multidetector CT imaging of the abdomen and pelvis was performed using the standard protocol following bolus administration of intravenous contrast. CONTRAST:  ISOVUE-300 IOPAMIDOL (ISOVUE-300) INJECTION 61% COMPARISON:  04/30/2018 FINDINGS: Lower Chest: Decreased airspace disease seen in both lower lobes, likely due to resolving  pneumonia. Hepatobiliary: A few tiny left hepatic lobe cysts are seen. No hepatic masses identified. Tiny calcified gallstones or layering sludge noted, however there is no evidence of cholecystitis or biliary ductal dilatation. Pancreas:  No mass or inflammatory changes. Spleen: Within normal limits in size and appearance. Adrenals/Urinary Tract: No masses identified. No evidence of hydronephrosis. Stomach/Bowel: Small amount of free air is seen, likely postoperative in etiology. No abscess identified. Multiple moderately dilated fluid-filled small bowel loops are seen in the left abdomen, with transition to nondilated small bowel in the left lower quadrant in area of surgical staples. This is consistent with a small-bowel obstruction. No mass or inflammatory process identified. Moderate amount of stool again seen in the right colon. Vascular/Lymphatic: No pathologically enlarged lymph nodes. No abdominal aortic aneurysm. Reproductive: Mild-to-moderate enlargement of prostate gland shows no significant change. Other: Subcutaneous emphysema is seen in the left anterior abdominal wall, likely postoperative in etiology. Musculoskeletal:  No suspicious bone lesions identified. IMPRESSION: Small bowel obstruction, with transition point in left lower quadrant. This may be due to adhesion, as no obstructing mass or inflammatory process identified. Small amount of postop intraperitoneal air and subcutaneous emphysema. No evidence of abscess. Cholelithiasis.  No radiographic evidence of cholecystitis. Decreased bilateral lower lobe airspace disease, consistent with resolving pneumonia or inflammatory process. Electronically Signed   By: Myles Rosenthal M.D.   On: 05/20/2018 13:41   Dg Chest Port 1 View  Result Date: 05/29/2018 CLINICAL DATA:  Order for SBO EXAM: PORTABLE CHEST 1 VIEW COMPARISON:  Radiograph 05/15/2018 FINDINGS: NG tube extends into the stomach. PICC line unchanged. Normal cardiac silhouette with ectatic  aorta. Lungs are hyperinflated. Remote RIGHT rib fractures. IMPRESSION: No acute cardiopulmonary process. Electronically Signed   By: Genevive Bi M.D.   On: 05/29/2018 10:41   Dg Chest Port 1 View  Result Date: 05/15/2018 CLINICAL DATA:  Shortness of breath. Patient 5 days postop bowel resection with lysis of adhesions. EXAM: PORTABLE CHEST 1 VIEW COMPARISON:  05/11/2018 FINDINGS: Right-sided PICC line with tip over the SVC. Lungs are adequately inflated with subtle left  retrocardiac opacification which may be due to vascular crowding or atelectasis and less likely infection. Remainder of the lungs are clear. Cardiomediastinal silhouette is within normal. Persistent evidence of free peritoneal air compatible with patient's recent surgery. IMPRESSION: Mild left base opacification likely atelectasis or vascular crowding and less likely infection. Persistent free peritoneal air compatible recent postop state. Right-sided PICC line with tip over the SVC. Electronically Signed   By: Elberta Fortis M.D.   On: 05/15/2018 09:38   Dg Chest Port 1 View  Result Date: 05/11/2018 CLINICAL DATA:  Shortness of breath, hypertension, pneumonia, dementia, former smoker EXAM: PORTABLE CHEST 1 VIEW COMPARISON:  Portable exam 0945 hours compared to 05/09/2018 FINDINGS: Nasogastric tube extends into stomach. RIGHT arm PICC line tip projects over SVC. Normal heart size and pulmonary vascularity. Elongation of thoracic aorta. Lungs appear emphysematous but clear. No pleural effusion or pneumothorax. Bones demineralized. Free air identified under the hemidiaphragms bilaterally; EHR indicates patient underwent a laparoscopic procedure on 05/10/2018, small-bowel resection and lysis of adhesions for small-bowel obstruction prior operative note. IMPRESSION: COPD changes without infiltrate. Free air consistent with abdominal surgery 1 day ago. Electronically Signed   By: Ulyses Southward M.D.   On: 05/11/2018 10:21   Dg Chest Port 1  View  Result Date: 05/09/2018 CLINICAL DATA:  Cough EXAM: PORTABLE CHEST 1 VIEW COMPARISON:  05/04/2018 FINDINGS: NG tube is stable. Right upper extremity PICC placed. Tip is at the cavoatrial junction. Lungs remain hyperaerated and clear. Small right pleural effusion is stable. No pneumothorax. Aorta remains somewhat prominent due to tortuosity and rotation of the thorax. IMPRESSION: New right upper extremity PICC with its tip at the cavoatrial junction. No active cardiopulmonary disease. Electronically Signed   By: Jolaine Click M.D.   On: 05/09/2018 07:37   Dg Abd 2 Views  Result Date: 05/05/2018 CLINICAL DATA:  Vomiting.  History of right lumpectomy. EXAM: ABDOMEN - 2 VIEW COMPARISON:  05/04/2018 FINDINGS: Improve dilation of small bowel loops with residual borderline gas-filled distended small bowel loops in the left upper quadrant of the abdomen. Normal colonic bowel gas pattern. No evidence of free intra-abdominal gas. Normal cardiac silhouette. Tortuosity of the thoracic aorta. Right pleural reflection versus small pleural effusion. No lobar airspace consolidation. Enteric catheter tip within the expected location the gastric body, side hole at the GE junction level. Right PICC line terminates in the expected location of the cavoatrial junction. IMPRESSION: Improved appearance of small-bowel obstruction. Enteric catheter with side hole at the level of the GE junction. Advancement may be considered. Possible small right pleural effusion. Electronically Signed   By: Ted Mcalpine M.D.   On: 05/05/2018 12:51   Dg Abd Acute W/chest  Result Date: 05/04/2018 CLINICAL DATA:  Follow-up small bowel obstruction. EXAM: DG ABDOMEN ACUTE W/ 1V CHEST COMPARISON:  Abdominal x-rays 05/04/1999 19 dating back to 04/29/2018. CT abdomen and pelvis 04/30/2018. Chest x-rays 05/01/2018, 04/12/2018. FINDINGS: Nasogastric tube tip in the fundus of the stomach. Persistent marked gaseous distension of the jejunum in  the LEFT UPPER QUADRANT, slowly progressive over the past 3 days, with air-fluid levels on the LATERAL decubitus image. No evidence of free intraperitoneal air. Normal caliber colon with moderate stool burden. Calcified uterine fibroid in the pelvic midline. Cardiac silhouette normal in size, unchanged. Thoracic aorta tortuous and atherosclerotic. Hilar and mediastinal contours otherwise unremarkable. Interval improvement in aeration in the LEFT lung base, with only minimal patchy opacities persisting. Lungs otherwise clear. Emphysematous changes in both lungs with scarring in the  RIGHT mid lung, unchanged. Pleuroparenchymal scarring at the RIGHT lung base which accounts for the blunting of the costophrenic angle, unchanged dating back to the original 04/12/2018 examination. IMPRESSION: 1. Persistent partial small bowel obstruction which has slowly worsened over the past 3 days. 2. No evidence of free intraperitoneal air. 3. Improved aeration in the LEFT LOWER LOBE, with only mild atelectasis and/or pneumonia persisting. Electronically Signed   By: Hulan Saas M.D.   On: 05/04/2018 09:28   Dg Abd Portable 1v-small Bowel Obstruction Protocol-initial, 8 Hr Delay  Result Date: 05/29/2018 CLINICAL DATA:  8 hour delay small-bowel obstruction film. EXAM: PORTABLE ABDOMEN - 1 VIEW COMPARISON:  05/29/2018 FINDINGS: Contrast material demonstrated throughout the colon. No small or large bowel distention is identified. Enteric tube tip is in the left upper quadrant consistent with location in the upper stomach. Proximal side hole projects at or just above the expected location of the EG junction. No change in position. IMPRESSION: Contrast material throughout the colon. No evidence of bowel obstruction. Electronically Signed   By: Burman Nieves M.D.   On: 05/29/2018 23:44   Dg Abd Portable 1v  Result Date: 05/29/2018 CLINICAL DATA:  Small bowel obstruction. EXAM: PORTABLE ABDOMEN - 1 VIEW COMPARISON:  One-view  abdomen 05/28/2018 and 05/27/2018. FINDINGS: Bowel gas pattern is normal. No significant dilated loops of bowel remain. There is no free air. The side port of the NG tube is above the GE junction and could be advanced for more optimal positioning. The lung bases are clear. IMPRESSION: 1. Normal bowel gas pattern. No residual dilated loops of small bowel. 2. The side port of the NG tube is above the GE junction. Electronically Signed   By: Marin Roberts M.D.   On: 05/29/2018 10:42   Dg Abd Portable 1v  Result Date: 05/28/2018 CLINICAL DATA:  Small-bowel obstruction EXAM: PORTABLE ABDOMEN - 1 VIEW COMPARISON:  Portable exam 0746 hours compared to 05/27/2018 FINDINGS: Scattered gas and minimal stool in colon. Few air-filled loops of small bowel in the LEFT mid abdomen, decreased in prominence since previous exam. Single loop demonstrates questionable mild wall bowel wall thickening. Tip of nasogastric tube projects over mid stomach. Bones appear demineralized. IMPRESSION: Decreased small bowel distention since prior study. Electronically Signed   By: Ulyses Southward M.D.   On: 05/28/2018 08:16   Dg Abd Portable 1v  Result Date: 05/27/2018 CLINICAL DATA:  Small bowel obstruction EXAM: PORTABLE ABDOMEN - 1 VIEW COMPARISON:  05/24/2018 FINDINGS: There is a nasogastric tube with the tip projecting over the stomach. There is mild gaseous distension of the small bowel and colon without evidence of obstruction. There is no evidence of pneumoperitoneum, portal venous gas or pneumatosis. There are no pathologic calcifications along the expected course of the ureters. The osseous structures are unremarkable. IMPRESSION: There is a nasogastric tube with the tip projecting over the stomach. There is mild gaseous distension of the small bowel and colon without evidence of obstruction. Electronically Signed   By: Elige Ko   On: 05/27/2018 09:44   Dg Abd Portable 1v  Result Date: 05/24/2018 CLINICAL DATA:   Follow-up small bowel obstruction. EXAM: PORTABLE ABDOMEN - 1 VIEW COMPARISON:  Portable supine abdominal radiograph of May 21, 2018 FINDINGS: The colonic stool burden is moderate. There are loops of mildly distended gas-filled small bowel to the left of midline. There is gas and stool in the rectum. There is no free extraluminal gas. The esophagogastric tube tip in proximal port project below the  GE junction. The bony structures exhibit no acute abnormality. IMPRESSION: There are a few loops of mildly distended gas-filled small bowel to the left of midline. The colonic stool burden and gas pattern is within the limits of normal. There is no evidence of perforation. Electronically Signed   By: Bocephus  [place] M.D.   On: 05/24/2018 09:25   Dg Abd Portable 1v-small Bowel Obstruction Protocol-initial, 8 Hr Delay  Result Date: 05/21/2018 CLINICAL DATA:  Small-bowel obstruction.  Follow-up exam. EXAM: PORTABLE ABDOMEN - 1 VIEW COMPARISON:  05/20/2018 FINDINGS: Significant improvement in small bowel dilation is noted since the prior study. The small bowel obstruction has essentially resolved radiographically. Mild generalized increased stool burden in the colon, unchanged from the previous exam. Midline abdominal wall surgical staples are stable. Nasogastric tube is well positioned, tip in the mid stomach. IMPRESSION: 1. Significant interval improvement from the previous day's exam. Small-bowel obstruction appears resolved radiographically. Electronically Signed   By: Amie Portland M.D.   On: 05/21/2018 13:12   Dg Abd Portable 1v  Result Date: 05/17/2018 CLINICAL DATA:  Small-bowel obstruction. EXAM: PORTABLE ABDOMEN - 1 VIEW COMPARISON:  05/16/2018.  05/15/2018.  CT 04/30/2018. FINDINGS: Surgical staples noted over the abdomen. NG tube noted with its tip in the stomach. Distended loops of small and large bowel are again noted. Similar findings noted on prior exam. No free air. Degenerative change in  osteopenia lumbar spine and both hips. Prostate calcifications are noted. Aortoiliac atherosclerotic vascular calcification. Diffuse osteopenia degenerative changes lumbar spine and both hips. Mild blunting of the right costophrenic angle again noted suggesting scarring and/or tiny effusion. IMPRESSION: NG tube noted with its tip in the stomach. Dilated loops of small and large bowel are again noted without significant interim change. No free air identified. Electronically Signed   By: Maisie Fus  Register   On: 05/17/2018 07:32   Dg Abd Portable 1v  Result Date: 05/15/2018 CLINICAL DATA:  NG tube placement EXAM: PORTABLE ABDOMEN - 1 VIEW COMPARISON:  05/15/2018 FINDINGS: NG tube is in placed into the fundus of the stomach. Continued gaseous distention of visualized upper abdominal bowel loops. IMPRESSION: NG tube in the fundus of the stomach Electronically Signed   By: Charlett Nose M.D.   On: 05/15/2018 20:11   Dg Abd Portable 1v  Result Date: 05/15/2018 CLINICAL DATA:  Small bowel obstruction. EXAM: PORTABLE ABDOMEN - 1 VIEW COMPARISON:  One-view abdomen 05/14/2018 FINDINGS: Patient's retained left.  Heart size normal.  Lung bases are clear. Extend loops of large and small bowel are stable. There is no free air or pneumatosis. Surgical clips are noted. IMPRESSION: 1. Similar appearance of distended large and small bowel consistent with obstruction or ileus. Electronically Signed   By: Marin Roberts M.D.   On: 05/15/2018 08:10   Dg Abd Portable 1v  Result Date: 05/14/2018 CLINICAL DATA:  Abdominal pain EXAM: PORTABLE ABDOMEN - 1 VIEW COMPARISON:  05/13/2018 FINDINGS: Interval NG tube removal. Some interval improvement in gaseous distention of the bowel. Stool and air throughout the transverse colon. Postop staples again noted. Air in the rectum. No significant interval change. Degenerative changes of the spine. Basilar chronic interstitial changes and atelectasis. IMPRESSION: NG tube removed.  Stable nonspecific bowel gas pattern, suspect resolving obstruction or ileus. Little interval change. Electronically Signed   By: Judie Petit.  Shick M.D.   On: 05/14/2018 10:23   Dg Abd Portable 1v  Result Date: 05/13/2018 CLINICAL DATA:  Abdominal pain, check NG placement EXAM: PORTABLE ABDOMEN - 1 VIEW  COMPARISON:  05/09/2018 FINDINGS: Nasogastric catheter is noted with the tip in the stomach. Proximal side port lies in the distal esophagus. This is stable from the prior exam. Scattered large and small bowel gas is noted. Postsurgical changes are now seen. Mild small bowel dilatation is noted which may be related to a postoperative ileus. IMPRESSION: Mild postoperative ileus. Nasogastric catheter as described. Electronically Signed   By: Alcide Clever M.D.   On: 05/13/2018 09:56   Dg Abd Portable 1v  Result Date: 05/09/2018 CLINICAL DATA:  Small-bowel obstruction EXAM: PORTABLE ABDOMEN - 1 VIEW COMPARISON:  05/08/2018; 05/06/2018; 05/05/2018 FINDINGS: Re-demonstrated gaseous distention of several loops of small bowel with index loop of small bowel within the left mid hemiabdomen measuring 3.6 cm in diameter. Moderate colonic stool burden. No supine evidence of pneumoperitoneum. No pneumatosis or portal venous gas. Presumed calcified fibroid overlies the left hemipelvis. Stable position of support apparatus. No acute osseus abnormalities. IMPRESSION: No change to slight improvement in suspected small-bowel obstruction. Electronically Signed   By: Simonne Come M.D.   On: 05/09/2018 07:47   Dg Abd Portable 1v  Result Date: 05/08/2018 CLINICAL DATA:  Follow up small bowel obstruction EXAM: PORTABLE ABDOMEN - 1 VIEW COMPARISON:  05/06/2018 FINDINGS: Scattered large and small bowel gas is noted. Multiple dilated loops of small bowel are again identified and stable in appearance. Nasogastric catheter is again noted within the stomach. No free air is seen. Degenerative changes of the lumbar spine are noted.  IMPRESSION: Persistent small bowel dilatation. No new focal abnormality is noted. Electronically Signed   By: Alcide Clever M.D.   On: 05/08/2018 08:36   Dg Abd Portable 1v  Result Date: 05/06/2018 CLINICAL DATA:  Small bowel obstruction EXAM: PORTABLE ABDOMEN - 1 VIEW COMPARISON:  Portable exam at 0628 hours compared to 05/05/2018 FINDINGS: Stool scattered throughout colon. Persistent dilatation of small bowel loops in the LEFT mid abdomen up to 4.9 cm diameter. No bowel wall thickening. Bones demineralized. No definite renal calcifications. IMPRESSION: Persistent small bowel dilatation consistent with small bowel obstruction. Little interval change. Electronically Signed   By: Ulyses Southward M.D.   On: 05/06/2018 10:21   Dg Abd Portable 1v-small Bowel Obstruction Protocol-initial, 8 Hr Delay  Result Date: 05/04/2018 CLINICAL DATA:  Small bowel obstruction protocol. 8 hour delay. EXAM: PORTABLE ABDOMEN - 1 VIEW COMPARISON:  05/03/2018 FINDINGS: Gaseous distention of left upper quadrant small bowel. Enteric tube with tip projected over the mid abdomen consistent with location in the upper stomach. Contrast material is demonstrated in the dilated small bowel. No contrast material is identified in the colon. This is suggestive of high-grade obstruction. Degenerative changes in the spine. IMPRESSION: Contrast material is demonstrated in the dilated small bowel but not in the colon consistent with high-grade obstruction. Electronically Signed   By: Burman Nieves M.D.   On: 05/04/2018 01:55   [place] Ekg Site Rite  Result Date: 05/04/2018 If Site Rite image not attached, placement could not be confirmed due to current cardiac rhythm.    LOS: 34 days   Signature  Jeoffrey Massed M.D on 06/02/2018 at 1:21 PM   -  To page go to [email or web address] - password [facility]

## 2018-06-02 NOTE — Progress Notes (Signed)
[facility] Progress Note  23 Days Post-Op  Subjective: CC-  No acute change. Tolerating D1 diet but intake is not recorded.  Objective: Vital signs in last 24 hours: Temp:  [97.8 F (36.6 C)-98.4 F (36.9 C)] 97.8 F (36.6 C) (11/07 0520) Pulse Rate:  [68-81] 81 (11/07 0520) Resp:  [17-18] 17 (11/07 0520) BP: (84-89)/(58-60) 89/59 (11/07 0520) SpO2:  [99 %-100 %] 99 % (11/07 0520) Last BM Date: 05/30/18  Intake/Output from previous day: 11/06 0701 - 11/07 0700 In: 490 [I.V.:385; IV Piggyback:105] Out: 350 [Urine:350] Intake/Output this shift: No intake/output data recorded.  PE: Gen: Alert, NAD Pulm: effort normal Abd: Soft, NT/ND, +BS, no HSM,incisions cdi with steri strips in place Skin: no rashes noted, warm and dry  Lab Results:  No results for input(s): WBC, HGB, HCT, PLT in the last 72 hours. BMET Recent Labs    05/30/18 1002 05/31/18 0317  NA 140 139  K 4.0 4.0  CL 111 111  CO2 24 24  GLUCOSE 101* 90  BUN 42* 41*  CREATININE 0.64 0.66  CALCIUM 9.3 9.0   PT/INR No results for input(s): LABPROT, INR in the last 72 hours. CMP     Component Value Date/Time   NA 139 05/31/2018 0317   K 4.0 05/31/2018 0317   CL 111 05/31/2018 0317   CO2 24 05/31/2018 0317   GLUCOSE 90 05/31/2018 0317   BUN 41 (H) 05/31/2018 0317   CREATININE 0.66 05/31/2018 0317   CALCIUM 9.0 05/31/2018 0317   PROT 7.4 05/30/2018 1002   ALBUMIN 2.7 (L) 05/30/2018 1002   AST 11 (L) 05/30/2018 1002   ALT 10 05/30/2018 1002   ALKPHOS 104 05/30/2018 1002   BILITOT 0.4 05/30/2018 1002   GFRNONAA >60 05/31/2018 0317   GFRAA >60 05/31/2018 0317   Lipase     Component Value Date/Time   LIPASE 204 (H) 04/29/2018 0904       Studies/Results: No results found.  Anti-infectives: Anti-infectives (From admission, onward)   Start     Dose/Rate Route Frequency Ordered Stop   05/10/18 0941  ceFAZolin (ANCEF) 2-4 GM/100ML-% IVPB    Note to Pharmacy:  [name]   : cabinet override      05/10/18 0941 05/10/18 2144   05/01/18 1215  metroNIDAZOLE (FLAGYL) IVPB 500 mg  Status:  Discontinued     500 mg 100 mL/hr over 60 Minutes Intravenous Every 8 hours 05/01/18 1201 05/06/18 1153   04/30/18 1100  cefTRIAXone (ROCEPHIN) 2 g in sodium chloride 0.9 % 100 mL IVPB  Status:  Discontinued     2 g 200 mL/hr over 30 Minutes Intravenous Every 24 hours 04/29/18 1416 05/06/18 1153   04/29/18 1430  cefTRIAXone (ROCEPHIN) 1 g in sodium chloride 0.9 % 100 mL IVPB     1 g 200 mL/hr over 30 Minutes Intravenous Every 24 hours 04/29/18 1416 04/30/18 1429   04/29/18 1130  cefTRIAXone (ROCEPHIN) 1 g in sodium chloride 0.9 % 100 mL IVPB     1 g 200 mL/hr over 30 Minutes Intravenous  Once 04/29/18 1118 04/29/18 1251       Assessment/Plan Hx of prior lung surgery Acute metabolic encephalopathy Anemia- transfused 10/14 -hg stable (11/4) AKI- resolved Hypertension- controlled Hypokalemia/Hyponatremia- resolved Hx of schizophrenia- cogentin/Zyprexa - very difficult to get answers from him Deconditioning - bedbound (?) Malnutrition - severe- TNA. 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 Continue TPN for now as we have no data to demonstrate whether he is eating enough to sustain himself.. Continue bowel regimen with daily miralax and dulcolax suppository.   ID -rocephin 10/4>>10/11, flagyl 10/6>>10/11 FEN -TPN, dysphagia 1 VTE -SCDs, SQ heparin Foley -condom cath   LOS: 34 days    Berna Bue , MD [facility] 06/02/2018, 8:29 AM

## 2018-06-02 NOTE — Progress Notes (Signed)
Tele Sitter called me and set alarm off because sister was in room trying to get patient from the chair to the commode without calling for help.  Sister is aware that she needs to call us for help.  I made her aware to call us if he needs to move for safety reasons.

## 2018-06-03 DIAGNOSIS — E43 Unspecified severe protein-calorie malnutrition: Secondary | ICD-10-CM

## 2018-06-03 LAB — GLUCOSE, CAPILLARY
GLUCOSE-CAPILLARY: 90 mg/dL (ref 70–99)
GLUCOSE-CAPILLARY: 93 mg/dL (ref 70–99)

## 2018-06-03 MED ORDER — ACETAMINOPHEN 325 MG PO TABS: 650.0000 mg | ORAL_TABLET | Freq: Four times a day (QID) | ORAL | Status: AC | PRN

## 2018-06-03 MED ORDER — BOOST PLUS PO LIQD: 237.0000 mL | Freq: Three times a day (TID) | ORAL | 0 refills | Status: AC

## 2018-06-03 MED ORDER — SENNOSIDES-DOCUSATE SODIUM 8.6-50 MG PO TABS: 2.0000 | ORAL_TABLET | Freq: Every evening | ORAL | Status: AC | PRN

## 2018-06-03 MED ORDER — BISACODYL 10 MG RE SUPP: 10.0000 mg | Freq: Every day | RECTAL | Status: DC | PRN

## 2018-06-03 MED ORDER — POLYETHYLENE GLYCOL 3350 17 G PO PACK: 17.0000 g | PACK | Freq: Two times a day (BID) | ORAL | 0 refills | Status: AC

## 2018-06-03 MED ORDER — BISACODYL 10 MG RE SUPP: 10.0000 mg | Freq: Every day | RECTAL | 0 refills | Status: AC | PRN

## 2018-06-03 MED ORDER — PRO-STAT SUGAR FREE PO LIQD: 30.0000 mL | Freq: Three times a day (TID) | ORAL | 0 refills | Status: AC

## 2018-06-03 NOTE — Progress Notes (Signed)
Report Given to Jon Gills RN at [facility].

## 2018-06-03 NOTE — Progress Notes (Signed)
CSW spoke [name] and let her know that patient will be discharging to [facility] today. CSW went over location of the facility. She will meet the patient there after her daughter brings her car back around 5pm.   [place] Davinci Glotfelty LCSW [phone]

## 2018-06-03 NOTE — Progress Notes (Signed)
CSW awaiting sister's call back in order to call [facility] for patient. Several attempts made by [facility].   [name] Alyse Kathan LCSW [phone]

## 2018-06-03 NOTE — Discharge Summary (Signed)
PATIENT DETAILS Name: Jeffrey Davila Age: 67 y.o. Sex: male Date of Birth: November 13, 1950 MRN: [id number]. Admitting Physician: Jeffrey Arms, MD PCP:Uba, Reuel Boom, MD  Admit Date: 04/29/2018 Discharge date: 06/03/2018  Recommendations for Outpatient Follow-up:  1. Follow up with PCP in 1-2 weeks 2. Please obtain BMP/CBC in one week 3. Please ensure follow-up with general surgery.  4. Please ensure speech therapy follow-up at SNF to see if diet can be slowly upgraded.  Admitted From:  Home  Disposition: SNF   Home Health: No  Equipment/Devices: None  Discharge Condition: Stable  CODE STATUS: FULL CODE  Diet recommendation:  Dysphagia 1   Brief Summary: See H&P, Labs, Consult and Test reports for all details in brief, Patient is a 67 y.o. male with history of schizophrenia/?  Cognitive dysfunction (dementia), hypertension, BPH, remote history (in the 1990s) of trauma causing small bowel injury requiring laparotomy-mostly wheelchair-bound-presented to the hospital with approximately 1 week history of intermittent vomiting, confusion for a few days prior to this hospital admission-presented with sepsis secondary to UTI, vomiting and significant constipation.  Patient was started on IV antibiotics and admitted to the hospital service,initial imaging was suggestive of constipation causing gastric distention-NG tube was placed, however upon further evaluation with a CT scan he was found  to have a small bowel obstruction.  General surgery was consulted, even with n.p.o. status/NG tube decompression-he continues to have SBO-patient's sister has refused to sign consent for surgery and wanted patient to receive several days of TNA before she consented to surgery.  Subsequently underwent laparotomy with lysis of adhesions on 10/15, postoperative course complicated by ileus which seems to finally have resolved on 05/30/2018.  See below for details  Brief Hospital Course: Small bowel  obstruction: Underwent laparotomy with lysis of adhesions 05/10/2018 - postoperative course complicated by ileus, recurrent bowel obstruction and constipation.  He is conservatively with bowel rest and numerous enemas.  Much improved-has had numerous BMs-NG tube was finally removed on 11/4-and was assessed by speech therapy and started on a dysphagia 1 diet.  Due to concern that patient was still not consuming adequate amount of calories-TNA was continued yesterday-Per nursing staff patient has consumed significant amount of his meals-and is also able to tolerate nutritional supplements as well.  Spoke with general surgery team-okay to stop TNA-okay to discharge to SNF today.  Will require follow-up with general surgery on discharge.  Sepsis secondary to complicated UTI: Sepsis pathophysiology has resolved, urine culture positive for Klebsiella-completed a course of IV Rocephin.  Blood cultures remain negative   Aspiration pneumonia: This occurred in the early part of his hospitalization-this is secondary recurrent vomiting the setting of SBO underlying frailty.  Has completed a course of antimicrobial therapy.  Continue to mobilize.    Acute metabolic encephalopathy: Resolved-secondary to UTI/SBO.  Back to baseline.   Anemia: Suspect anemia secondary to acute illness and IV fluid dilution. Has been transfused a total of 3 units of PRBC so far-last was on 10/20.  Last hemoglobin stable at 9.9.  AKI: Hemodynamically mediated-resolved.  Schizophrenia: Appears stable-continue Depakote and olanzapine.    Hypertension: Blood pressure appears stable-continue to hold antihypertensives.   Severe malnutrition: Continue supplements.  Deconditioning/debility: Suspect has significant amount of debility at baseline-he is very cachectic-deconditioning/debility has worsened due to acute illness/SBO.  PT following.  Plans are for SNF on discharge.  Procedures/Studies: 10/15>>laparotomy with lysis of  adhesions  Discharge Diagnoses:  Principal Problem:   SBO (small bowel obstruction) s/p adhesiolysis & SB  resection 05/10/2018 Active Problems:   Sepsis (HCC)   Protein-calorie malnutrition, severe   Bipolar disorder (HCC)   Schizophrenia (HCC)   Hypertension   Anxiety   Anemia   Discharge Instructions:  Activity:  As tolerated with Full fall precautions use walker/cane & assistance as needed   Discharge Instructions    Call MD for:  redness, tenderness, or signs of infection (pain, swelling, redness, odor or green/yellow discharge around incision site)   Complete by:  As directed    Diet - low sodium heart healthy   Complete by:  As directed    Diet recommendations: Dysphagia 1 (puree);Thin liquid Liquids provided via: Straw;Cup Medication Administration: Crushed with puree Supervision: Staff to assist with self feeding;Full supervision/cueing for compensatory strategies Compensations: Minimize environmental distractions;Slow rate;Small sips/bites;Lingual sweep for clearance of pocketing;Follow solids with liquid Postural Changes and/or Swallow Maneuvers: Seated upright 90 degrees;Upright 30-60 min after meal      Discharge instructions   Complete by:  As directed    Follow with Primary MD  Jeffrey Manns, MD in 1 week  Ensure follow-up with general surgery as instructed  Please get a complete blood count and chemistry panel checked by your Primary MD at your next visit, and again as instructed by your Primary MD.  Get Medicines reviewed and adjusted: Please take all your medications with you for your next visit with your Primary MD  Laboratory/radiological data: Please request your Primary MD to go over all hospital tests and procedure/radiological results at the follow up, please ask your Primary MD to get all Hospital records sent to his/her office.  In some cases, they will be blood work, cultures and biopsy results pending at the time of your discharge. Please  request that your primary care M.D. follows up on these results.  Also Note the following: If you experience worsening of your admission symptoms, develop shortness of breath, life threatening emergency, suicidal or homicidal thoughts you must seek medical attention immediately by calling 911 or calling your MD immediately  if symptoms less severe.  You must read complete instructions/literature along with all the possible adverse reactions/side effects for all the Medicines you take and that have been prescribed to you. Take any new Medicines after you have completely understood and accpet all the possible adverse reactions/side effects.   Do not drive when taking Pain medications or sleeping medications (Benzodaizepines)  Do not take more than prescribed Pain, Sleep and Anxiety Medications. It is not advisable to combine anxiety,sleep and pain medications without talking with your primary care practitioner  Special Instructions: If you have smoked or chewed Tobacco  in the last 2 yrs please stop smoking, stop any regular Alcohol  and or any Recreational drug use.  Wear Seat belts while driving.  Please note: You were cared for by a hospitalist during your hospital stay. Once you are discharged, your primary care physician will handle any further medical issues. Please note that NO REFILLS for any discharge medications will be authorized once you are discharged, as it is imperative that you return to your primary care physician (or establish a relationship with a primary care physician if you do not have one) for your post hospital discharge needs so that they can reassess your need for medications and monitor your lab values.   Increase activity slowly   Complete by:  As directed      Allergies as of 06/03/2018   No Known Allergies     Medication List    STOP  taking these medications   ibuprofen 800 MG tablet Commonly known as:  ADVIL,MOTRIN   metoprolol tartrate 25 MG  tablet Commonly known as:  LOPRESSOR   Na Sulfate-K Sulfate-Mg Sulf 17.5-3.13-1.6 GM/177ML Soln     TAKE these medications   acetaminophen 325 MG tablet Commonly known as:  TYLENOL Take 2 tablets (650 mg total) by mouth every 6 (six) hours as needed.   benztropine 1 MG tablet Commonly known as:  COGENTIN Take 1 tablet (1 mg total) by mouth 2 (two) times daily for 14 days.   bisacodyl 10 MG suppository Commonly known as:  DULCOLAX Place 1 suppository (10 mg total) rectally daily as needed for moderate constipation.   divalproex 500 MG DR tablet Commonly known as:  DEPAKOTE Take 1 tablet (500 mg total) by mouth 2 (two) times daily for 14 days.   feeding supplement (PRO-STAT SUGAR FREE 64) Liqd Take 30 mLs by mouth 3 (three) times daily with meals.   lactose free nutrition Liqd Take 237 mLs by mouth 3 (three) times daily with meals.   OLANZapine 10 MG tablet Commonly known as:  ZYPREXA Take 1 tablet (10 mg total) by mouth 2 (two) times daily for 14 days.   polyethylene glycol packet Commonly known as:  MIRALAX / GLYCOLAX Take 17 g by mouth 2 (two) times daily.   senna-docusate 8.6-50 MG tablet Commonly known as:  Senokot-S Take 2 tablets by mouth at bedtime as needed for mild constipation.   tamsulosin 0.4 MG Caps capsule Commonly known as:  FLOMAX Take 0.4 mg by mouth daily.      Follow-up Information    Axel Filler, MD. Call in 2 week(s).   Specialty:  General Surgery Why:  Call for follow up appointment in 2 weeks. Contact information: [address] [place] [address] [phone]        Jeffrey Manns, MD. Schedule an appointment as soon as possible for a visit in 1 week(s).   Specialty:  Family Medicine Contact information: [facility] [address] [place] [address] [phone]          No Known Allergies    Consultations:   general surgery and Palliative care  Other Procedures/Studies: Dg Abd 1  View  Result Date: 05/20/2018 CLINICAL DATA:  NG tube adjustment EXAM: ABDOMEN - 1 VIEW COMPARISON:  05/20/2018 FINDINGS: Esophageal tube tip and side-port project over gastric fundus, somewhat abrupt appearing curvature at the distal tip with persistent gaseous dilatation. Continued marked gaseous enlargement of bowel in the left abdomen up to 5 cm. Moderate stool in the colon. Residual contrast within the bladder. IMPRESSION: 1. Esophageal tube tip and side port overlie the gastric fundus, somewhat abrupt curvature of the tip, could be due to mild kinking. 2. Persistent dilatation of stomach and left abdominal small bowel consistent with a bowel obstruction. Electronically Signed   By: Jasmine Pang M.D.   On: 05/20/2018 18:37   Dg Abd 1 View  Result Date: 05/20/2018 CLINICAL DATA:  NG tube placement. EXAM: ABDOMEN - 1 VIEW COMPARISON:  CT abdomen pelvis from same day. FINDINGS: Tip of the NG tube in the stomach with the proximal side port at the gastroesophageal junction. Dilated loops of small bowel in the left abdomen are unchanged. Contrast is seen within the renal collecting systems and bladder. No acute osseous abnormality. IMPRESSION: 1. NG tube tip in the stomach with the proximal side port at the gastroesophageal junction. Consider advancing 2-3 cm. 2. Unchanged  small bowel obstruction. Electronically Signed   By: Obie Dredge M.D.   On: 05/20/2018 16:47   Dg Abd 1 View  Result Date: 05/16/2018 CLINICAL DATA:  Feeding tube advanced today. EXAM: ABDOMEN - 1 VIEW COMPARISON:  Plain films of the abdomen dated 05/15/2018 and 05/14/2018. FINDINGS: Enteric tube is coiled in the stomach with tip at the level of the gastric fundus/cardia. Distended gas-filled loops of large and small bowel are again appreciated throughout the abdomen and upper pelvis. IMPRESSION: 1. Feeding tube is coiled in the stomach with tip at the level of the gastric fundus/cardia. 2. Persistent gaseous distention of both  large and small bowel loops throughout the abdomen and pelvis, compatible with previous reports of obstruction or ileus. Electronically Signed   By: Bary Richard M.D.   On: 05/16/2018 20:26   Ct Abdomen Pelvis W Contrast  Result Date: 05/20/2018 CLINICAL DATA:  Nausea and vomiting. 10 days status post laparoscopic lysis of adhesions and bowel resection. EXAM: CT ABDOMEN AND PELVIS WITH CONTRAST TECHNIQUE: Multidetector CT imaging of the abdomen and pelvis was performed using the standard protocol following bolus administration of intravenous contrast. CONTRAST:  ISOVUE-300 IOPAMIDOL (ISOVUE-300) INJECTION 61% COMPARISON:  04/30/2018 FINDINGS: Lower Chest: Decreased airspace disease seen in both lower lobes, likely due to resolving pneumonia. Hepatobiliary: A few tiny left hepatic lobe cysts are seen. No hepatic masses identified. Tiny calcified gallstones or layering sludge noted, however there is no evidence of cholecystitis or biliary ductal dilatation. Pancreas:  No mass or inflammatory changes. Spleen: Within normal limits in size and appearance. Adrenals/Urinary Tract: No masses identified. No evidence of hydronephrosis. Stomach/Bowel: Small amount of free air is seen, likely postoperative in etiology. No abscess identified. Multiple moderately dilated fluid-filled small bowel loops are seen in the left abdomen, with transition to nondilated small bowel in the left lower quadrant in area of surgical staples. This is consistent with a small-bowel obstruction. No mass or inflammatory process identified. Moderate amount of stool again seen in the right colon. Vascular/Lymphatic: No pathologically enlarged lymph nodes. No abdominal aortic aneurysm. Reproductive: Mild-to-moderate enlargement of prostate gland shows no significant change. Other: Subcutaneous emphysema is seen in the left anterior abdominal wall, likely postoperative in etiology. Musculoskeletal:  No suspicious bone lesions identified.  IMPRESSION: Small bowel obstruction, with transition point in left lower quadrant. This may be due to adhesion, as no obstructing mass or inflammatory process identified. Small amount of postop intraperitoneal air and subcutaneous emphysema. No evidence of abscess. Cholelithiasis.  No radiographic evidence of cholecystitis. Decreased bilateral lower lobe airspace disease, consistent with resolving pneumonia or inflammatory process. Electronically Signed   By: Myles Rosenthal M.D.   On: 05/20/2018 13:41   Dg Chest Port 1 View  Result Date: 05/29/2018 CLINICAL DATA:  Order for SBO EXAM: PORTABLE CHEST 1 VIEW COMPARISON:  Radiograph 05/15/2018 FINDINGS: NG tube extends into the stomach. PICC line unchanged. Normal cardiac silhouette with ectatic aorta. Lungs are hyperinflated. Remote RIGHT rib fractures. IMPRESSION: No acute cardiopulmonary process. Electronically Signed   By: Genevive Bi M.D.   On: 05/29/2018 10:41   Dg Chest Port 1 View  Result Date: 05/15/2018 CLINICAL DATA:  Shortness of breath. Patient 5 days postop bowel resection with lysis of adhesions. EXAM: PORTABLE CHEST 1 VIEW COMPARISON:  05/11/2018 FINDINGS: Right-sided PICC line with tip over the SVC. Lungs are adequately inflated with subtle left retrocardiac opacification which may be due to vascular crowding or atelectasis and less likely infection. Remainder of the lungs  are clear. Cardiomediastinal silhouette is within normal. Persistent evidence of free peritoneal air compatible with patient's recent surgery. IMPRESSION: Mild left base opacification likely atelectasis or vascular crowding and less likely infection. Persistent free peritoneal air compatible recent postop state. Right-sided PICC line with tip over the SVC. Electronically Signed   By: Elberta Fortis M.D.   On: 05/15/2018 09:38   Dg Chest Port 1 View  Result Date: 05/11/2018 CLINICAL DATA:  Shortness of breath, hypertension, pneumonia, dementia, former smoker EXAM:  PORTABLE CHEST 1 VIEW COMPARISON:  Portable exam 0945 hours compared to 05/09/2018 FINDINGS: Nasogastric tube extends into stomach. RIGHT arm PICC line tip projects over SVC. Normal heart size and pulmonary vascularity. Elongation of thoracic aorta. Lungs appear emphysematous but clear. No pleural effusion or pneumothorax. Bones demineralized. Free air identified under the hemidiaphragms bilaterally; EHR indicates patient underwent a laparoscopic procedure on 05/10/2018, small-bowel resection and lysis of adhesions for small-bowel obstruction prior operative note. IMPRESSION: COPD changes without infiltrate. Free air consistent with abdominal surgery 1 day ago. Electronically Signed   By: Ulyses Southward M.D.   On: 05/11/2018 10:21   Dg Chest Port 1 View  Result Date: 05/09/2018 CLINICAL DATA:  Cough EXAM: PORTABLE CHEST 1 VIEW COMPARISON:  05/04/2018 FINDINGS: NG tube is stable. Right upper extremity PICC placed. Tip is at the cavoatrial junction. Lungs remain hyperaerated and clear. Small right pleural effusion is stable. No pneumothorax. Aorta remains somewhat prominent due to tortuosity and rotation of the thorax. IMPRESSION: New right upper extremity PICC with its tip at the cavoatrial junction. No active cardiopulmonary disease. Electronically Signed   By: Jolaine Click M.D.   On: 05/09/2018 07:37   Dg Abd 2 Views  Result Date: 05/05/2018 CLINICAL DATA:  Vomiting.  History of right lumpectomy. EXAM: ABDOMEN - 2 VIEW COMPARISON:  05/04/2018 FINDINGS: Improve dilation of small bowel loops with residual borderline gas-filled distended small bowel loops in the left upper quadrant of the abdomen. Normal colonic bowel gas pattern. No evidence of free intra-abdominal gas. Normal cardiac silhouette. Tortuosity of the thoracic aorta. Right pleural reflection versus small pleural effusion. No lobar airspace consolidation. Enteric catheter tip within the expected location the gastric body, side hole at the GE  junction level. Right PICC line terminates in the expected location of the cavoatrial junction. IMPRESSION: Improved appearance of small-bowel obstruction. Enteric catheter with side hole at the level of the GE junction. Advancement may be considered. Possible small right pleural effusion. Electronically Signed   By: Ted Mcalpine M.D.   On: 05/05/2018 12:51   Dg Abd Portable 1v-small Bowel Obstruction Protocol-initial, 8 Hr Delay  Result Date: 05/29/2018 CLINICAL DATA:  8 hour delay small-bowel obstruction film. EXAM: PORTABLE ABDOMEN - 1 VIEW COMPARISON:  05/29/2018 FINDINGS: Contrast material demonstrated throughout the colon. No small or large bowel distention is identified. Enteric tube tip is in the left upper quadrant consistent with location in the upper stomach. Proximal side hole projects at or just above the expected location of the EG junction. No change in position. IMPRESSION: Contrast material throughout the colon. No evidence of bowel obstruction. Electronically Signed   By: Burman Nieves M.D.   On: 05/29/2018 23:44   Dg Abd Portable 1v  Result Date: 05/29/2018 CLINICAL DATA:  Small bowel obstruction. EXAM: PORTABLE ABDOMEN - 1 VIEW COMPARISON:  One-view abdomen 05/28/2018 and 05/27/2018. FINDINGS: Bowel gas pattern is normal. No significant dilated loops of bowel remain. There is no free air. The side port of the NG tube  is above the GE junction and could be advanced for more optimal positioning. The lung bases are clear. IMPRESSION: 1. Normal bowel gas pattern. No residual dilated loops of small bowel. 2. The side port of the NG tube is above the GE junction. Electronically Signed   By: Marin Roberts M.D.   On: 05/29/2018 10:42   Dg Abd Portable 1v  Result Date: 05/28/2018 CLINICAL DATA:  Small-bowel obstruction EXAM: PORTABLE ABDOMEN - 1 VIEW COMPARISON:  Portable exam 0746 hours compared to 05/27/2018 FINDINGS: Scattered gas and minimal stool in colon. Few air-filled  loops of small bowel in the LEFT mid abdomen, decreased in prominence since previous exam. Single loop demonstrates questionable mild wall bowel wall thickening. Tip of nasogastric tube projects over mid stomach. Bones appear demineralized. IMPRESSION: Decreased small bowel distention since prior study. Electronically Signed   By: Ulyses Southward M.D.   On: 05/28/2018 08:16   Dg Abd Portable 1v  Result Date: 05/27/2018 CLINICAL DATA:  Small bowel obstruction EXAM: PORTABLE ABDOMEN - 1 VIEW COMPARISON:  05/24/2018 FINDINGS: There is a nasogastric tube with the tip projecting over the stomach. There is mild gaseous distension of the small bowel and colon without evidence of obstruction. There is no evidence of pneumoperitoneum, portal venous gas or pneumatosis. There are no pathologic calcifications along the expected course of the ureters. The osseous structures are unremarkable. IMPRESSION: There is a nasogastric tube with the tip projecting over the stomach. There is mild gaseous distension of the small bowel and colon without evidence of obstruction. Electronically Signed   By: Elige Ko   On: 05/27/2018 09:44   Dg Abd Portable 1v  Result Date: 05/24/2018 CLINICAL DATA:  Follow-up small bowel obstruction. EXAM: PORTABLE ABDOMEN - 1 VIEW COMPARISON:  Portable supine abdominal radiograph of May 21, 2018 FINDINGS: The colonic stool burden is moderate. There are loops of mildly distended gas-filled small bowel to the left of midline. There is gas and stool in the rectum. There is no free extraluminal gas. The esophagogastric tube tip in proximal port project below the GE junction. The bony structures exhibit no acute abnormality. IMPRESSION: There are a few loops of mildly distended gas-filled small bowel to the left of midline. The colonic stool burden and gas pattern is within the limits of normal. There is no evidence of perforation. Electronically Signed   By: Linas  [place] M.D.   On: 05/24/2018 09:25    Dg Abd Portable 1v-small Bowel Obstruction Protocol-initial, 8 Hr Delay  Result Date: 05/21/2018 CLINICAL DATA:  Small-bowel obstruction.  Follow-up exam. EXAM: PORTABLE ABDOMEN - 1 VIEW COMPARISON:  05/20/2018 FINDINGS: Significant improvement in small bowel dilation is noted since the prior study. The small bowel obstruction has essentially resolved radiographically. Mild generalized increased stool burden in the colon, unchanged from the previous exam. Midline abdominal wall surgical staples are stable. Nasogastric tube is well positioned, tip in the mid stomach. IMPRESSION: 1. Significant interval improvement from the previous day's exam. Small-bowel obstruction appears resolved radiographically. Electronically Signed   By: Amie Portland M.D.   On: 05/21/2018 13:12   Dg Abd Portable 1v  Result Date: 05/17/2018 CLINICAL DATA:  Small-bowel obstruction. EXAM: PORTABLE ABDOMEN - 1 VIEW COMPARISON:  05/16/2018.  05/15/2018.  CT 04/30/2018. FINDINGS: Surgical staples noted over the abdomen. NG tube noted with its tip in the stomach. Distended loops of small and large bowel are again noted. Similar findings noted on prior exam. No free air. Degenerative change in osteopenia lumbar spine and  both hips. Prostate calcifications are noted. Aortoiliac atherosclerotic vascular calcification. Diffuse osteopenia degenerative changes lumbar spine and both hips. Mild blunting of the right costophrenic angle again noted suggesting scarring and/or tiny effusion. IMPRESSION: NG tube noted with its tip in the stomach. Dilated loops of small and large bowel are again noted without significant interim change. No free air identified. Electronically Signed   By: Maisie Fus  Register   On: 05/17/2018 07:32   Dg Abd Portable 1v  Result Date: 05/15/2018 CLINICAL DATA:  NG tube placement EXAM: PORTABLE ABDOMEN - 1 VIEW COMPARISON:  05/15/2018 FINDINGS: NG tube is in placed into the fundus of the stomach. Continued gaseous  distention of visualized upper abdominal bowel loops. IMPRESSION: NG tube in the fundus of the stomach Electronically Signed   By: Charlett Nose M.D.   On: 05/15/2018 20:11   Dg Abd Portable 1v  Result Date: 05/15/2018 CLINICAL DATA:  Small bowel obstruction. EXAM: PORTABLE ABDOMEN - 1 VIEW COMPARISON:  One-view abdomen 05/14/2018 FINDINGS: Patient's retained left.  Heart size normal.  Lung bases are clear. Extend loops of large and small bowel are stable. There is no free air or pneumatosis. Surgical clips are noted. IMPRESSION: 1. Similar appearance of distended large and small bowel consistent with obstruction or ileus. Electronically Signed   By: Marin Roberts M.D.   On: 05/15/2018 08:10   Dg Abd Portable 1v  Result Date: 05/14/2018 CLINICAL DATA:  Abdominal pain EXAM: PORTABLE ABDOMEN - 1 VIEW COMPARISON:  05/13/2018 FINDINGS: Interval NG tube removal. Some interval improvement in gaseous distention of the bowel. Stool and air throughout the transverse colon. Postop staples again noted. Air in the rectum. No significant interval change. Degenerative changes of the spine. Basilar chronic interstitial changes and atelectasis. IMPRESSION: NG tube removed. Stable nonspecific bowel gas pattern, suspect resolving obstruction or ileus. Little interval change. Electronically Signed   By: Judie Petit.  Shick M.D.   On: 05/14/2018 10:23   Dg Abd Portable 1v  Result Date: 05/13/2018 CLINICAL DATA:  Abdominal pain, check NG placement EXAM: PORTABLE ABDOMEN - 1 VIEW COMPARISON:  05/09/2018 FINDINGS: Nasogastric catheter is noted with the tip in the stomach. Proximal side port lies in the distal esophagus. This is stable from the prior exam. Scattered large and small bowel gas is noted. Postsurgical changes are now seen. Mild small bowel dilatation is noted which may be related to a postoperative ileus. IMPRESSION: Mild postoperative ileus. Nasogastric catheter as described. Electronically Signed   By: Alcide Clever M.D.   On: 05/13/2018 09:56   Dg Abd Portable 1v  Result Date: 05/09/2018 CLINICAL DATA:  Small-bowel obstruction EXAM: PORTABLE ABDOMEN - 1 VIEW COMPARISON:  05/08/2018; 05/06/2018; 05/05/2018 FINDINGS: Re-demonstrated gaseous distention of several loops of small bowel with index loop of small bowel within the left mid hemiabdomen measuring 3.6 cm in diameter. Moderate colonic stool burden. No supine evidence of pneumoperitoneum. No pneumatosis or portal venous gas. Presumed calcified fibroid overlies the left hemipelvis. Stable position of support apparatus. No acute osseus abnormalities. IMPRESSION: No change to slight improvement in suspected small-bowel obstruction. Electronically Signed   By: Simonne Come M.D.   On: 05/09/2018 07:47   Dg Abd Portable 1v  Result Date: 05/08/2018 CLINICAL DATA:  Follow up small bowel obstruction EXAM: PORTABLE ABDOMEN - 1 VIEW COMPARISON:  05/06/2018 FINDINGS: Scattered large and small bowel gas is noted. Multiple dilated loops of small bowel are again identified and stable in appearance. Nasogastric catheter is again noted within the stomach. No  free air is seen. Degenerative changes of the lumbar spine are noted. IMPRESSION: Persistent small bowel dilatation. No new focal abnormality is noted. Electronically Signed   By: Alcide Clever M.D.   On: 05/08/2018 08:36   Dg Abd Portable 1v  Result Date: 05/06/2018 CLINICAL DATA:  Small bowel obstruction EXAM: PORTABLE ABDOMEN - 1 VIEW COMPARISON:  Portable exam at 0628 hours compared to 05/05/2018 FINDINGS: Stool scattered throughout colon. Persistent dilatation of small bowel loops in the LEFT mid abdomen up to 4.9 cm diameter. No bowel wall thickening. Bones demineralized. No definite renal calcifications. IMPRESSION: Persistent small bowel dilatation consistent with small bowel obstruction. Little interval change. Electronically Signed   By: Ulyses Southward M.D.   On: 05/06/2018 10:21   [place] Ekg Site Rite  Result  Date: 05/04/2018 If Site Rite image not attached, placement could not be confirmed due to current cardiac rhythm.    TODAY-DAY OF DISCHARGE:  Subjective:   Calvin Jablonowski today has no headache,no chest abdominal pain,no new weakness tingling or numbness, feels much better wants to go home today.  Objective:   Blood pressure (!) 85/55, pulse 81, temperature 98.6 F (37 C), temperature source Oral, resp. rate 16, height 5\' 6"  (1.676 m), weight 48.1 kg, SpO2 100 %.  Intake/Output Summary (Last 24 hours) at 06/03/2018 0900 Last data filed at 06/03/2018 0600 Gross per 24 hour  Intake 1050.22 ml  Output -  Net 1050.22 ml   Filed Weights   04/29/18 0842 05/30/18 1129 06/03/18 0700  Weight: 49.9 kg 46.4 kg 48.1 kg    Exam: Awake Alert, Oriented *3, No new F.N deficits, Normal affect [place].AT,PERRAL Supple Neck,No JVD, No cervical lymphadenopathy appriciated.  Symmetrical Chest wall movement, Good air movement bilaterally, CTAB RRR,No Gallops,Rubs or new Murmurs, No Parasternal Heave +ve B.Sounds, Abd Soft, Non tender, No organomegaly appriciated, No rebound -guarding or rigidity. No Cyanosis, Clubbing or edema, No new Rash or bruise   PERTINENT RADIOLOGIC STUDIES: Dg Abd 1 View  Result Date: 05/20/2018 CLINICAL DATA:  NG tube adjustment EXAM: ABDOMEN - 1 VIEW COMPARISON:  05/20/2018 FINDINGS: Esophageal tube tip and side-port project over gastric fundus, somewhat abrupt appearing curvature at the distal tip with persistent gaseous dilatation. Continued marked gaseous enlargement of bowel in the left abdomen up to 5 cm. Moderate stool in the colon. Residual contrast within the bladder. IMPRESSION: 1. Esophageal tube tip and side port overlie the gastric fundus, somewhat abrupt curvature of the tip, could be due to mild kinking. 2. Persistent dilatation of stomach and left abdominal small bowel consistent with a bowel obstruction. Electronically Signed   By: Jasmine Pang M.D.   On:  05/20/2018 18:37   Dg Abd 1 View  Result Date: 05/20/2018 CLINICAL DATA:  NG tube placement. EXAM: ABDOMEN - 1 VIEW COMPARISON:  CT abdomen pelvis from same day. FINDINGS: Tip of the NG tube in the stomach with the proximal side port at the gastroesophageal junction. Dilated loops of small bowel in the left abdomen are unchanged. Contrast is seen within the renal collecting systems and bladder. No acute osseous abnormality. IMPRESSION: 1. NG tube tip in the stomach with the proximal side port at the gastroesophageal junction. Consider advancing 2-3 cm. 2. Unchanged small bowel obstruction. Electronically Signed   By: Obie Dredge M.D.   On: 05/20/2018 16:47   Dg Abd 1 View  Result Date: 05/16/2018 CLINICAL DATA:  Feeding tube advanced today. EXAM: ABDOMEN - 1 VIEW COMPARISON:  Plain films of the abdomen  dated 05/15/2018 and 05/14/2018. FINDINGS: Enteric tube is coiled in the stomach with tip at the level of the gastric fundus/cardia. Distended gas-filled loops of large and small bowel are again appreciated throughout the abdomen and upper pelvis. IMPRESSION: 1. Feeding tube is coiled in the stomach with tip at the level of the gastric fundus/cardia. 2. Persistent gaseous distention of both large and small bowel loops throughout the abdomen and pelvis, compatible with previous reports of obstruction or ileus. Electronically Signed   By: Bary Richard M.D.   On: 05/16/2018 20:26   Ct Abdomen Pelvis W Contrast  Result Date: 05/20/2018 CLINICAL DATA:  Nausea and vomiting. 10 days status post laparoscopic lysis of adhesions and bowel resection. EXAM: CT ABDOMEN AND PELVIS WITH CONTRAST TECHNIQUE: Multidetector CT imaging of the abdomen and pelvis was performed using the standard protocol following bolus administration of intravenous contrast. CONTRAST:  ISOVUE-300 IOPAMIDOL (ISOVUE-300) INJECTION 61% COMPARISON:  04/30/2018 FINDINGS: Lower Chest: Decreased airspace disease seen in both lower  lobes, likely due to resolving pneumonia. Hepatobiliary: A few tiny left hepatic lobe cysts are seen. No hepatic masses identified. Tiny calcified gallstones or layering sludge noted, however there is no evidence of cholecystitis or biliary ductal dilatation. Pancreas:  No mass or inflammatory changes. Spleen: Within normal limits in size and appearance. Adrenals/Urinary Tract: No masses identified. No evidence of hydronephrosis. Stomach/Bowel: Small amount of free air is seen, likely postoperative in etiology. No abscess identified. Multiple moderately dilated fluid-filled small bowel loops are seen in the left abdomen, with transition to nondilated small bowel in the left lower quadrant in area of surgical staples. This is consistent with a small-bowel obstruction. No mass or inflammatory process identified. Moderate amount of stool again seen in the right colon. Vascular/Lymphatic: No pathologically enlarged lymph nodes. No abdominal aortic aneurysm. Reproductive: Mild-to-moderate enlargement of prostate gland shows no significant change. Other: Subcutaneous emphysema is seen in the left anterior abdominal wall, likely postoperative in etiology. Musculoskeletal:  No suspicious bone lesions identified. IMPRESSION: Small bowel obstruction, with transition point in left lower quadrant. This may be due to adhesion, as no obstructing mass or inflammatory process identified. Small amount of postop intraperitoneal air and subcutaneous emphysema. No evidence of abscess. Cholelithiasis.  No radiographic evidence of cholecystitis. Decreased bilateral lower lobe airspace disease, consistent with resolving pneumonia or inflammatory process. Electronically Signed   By: Myles Rosenthal M.D.   On: 05/20/2018 13:41   Dg Chest Port 1 View  Result Date: 05/29/2018 CLINICAL DATA:  Order for SBO EXAM: PORTABLE CHEST 1 VIEW COMPARISON:  Radiograph 05/15/2018 FINDINGS: NG tube extends into the stomach. PICC line unchanged. Normal  cardiac silhouette with ectatic aorta. Lungs are hyperinflated. Remote RIGHT rib fractures. IMPRESSION: No acute cardiopulmonary process. Electronically Signed   By: Genevive Bi M.D.   On: 05/29/2018 10:41   Dg Chest Port 1 View  Result Date: 05/15/2018 CLINICAL DATA:  Shortness of breath. Patient 5 days postop bowel resection with lysis of adhesions. EXAM: PORTABLE CHEST 1 VIEW COMPARISON:  05/11/2018 FINDINGS: Right-sided PICC line with tip over the SVC. Lungs are adequately inflated with subtle left retrocardiac opacification which may be due to vascular crowding or atelectasis and less likely infection. Remainder of the lungs are clear. Cardiomediastinal silhouette is within normal. Persistent evidence of free peritoneal air compatible with patient's recent surgery. IMPRESSION: Mild left base opacification likely atelectasis or vascular crowding and less likely infection. Persistent free peritoneal air compatible recent postop state. Right-sided PICC line with tip over  the SVC. Electronically Signed   By: Elberta Fortis M.D.   On: 05/15/2018 09:38   Dg Chest Port 1 View  Result Date: 05/11/2018 CLINICAL DATA:  Shortness of breath, hypertension, pneumonia, dementia, former smoker EXAM: PORTABLE CHEST 1 VIEW COMPARISON:  Portable exam 0945 hours compared to 05/09/2018 FINDINGS: Nasogastric tube extends into stomach. RIGHT arm PICC line tip projects over SVC. Normal heart size and pulmonary vascularity. Elongation of thoracic aorta. Lungs appear emphysematous but clear. No pleural effusion or pneumothorax. Bones demineralized. Free air identified under the hemidiaphragms bilaterally; EHR indicates patient underwent a laparoscopic procedure on 05/10/2018, small-bowel resection and lysis of adhesions for small-bowel obstruction prior operative note. IMPRESSION: COPD changes without infiltrate. Free air consistent with abdominal surgery 1 day ago. Electronically Signed   By: Ulyses Southward M.D.   On:  05/11/2018 10:21   Dg Chest Port 1 View  Result Date: 05/09/2018 CLINICAL DATA:  Cough EXAM: PORTABLE CHEST 1 VIEW COMPARISON:  05/04/2018 FINDINGS: NG tube is stable. Right upper extremity PICC placed. Tip is at the cavoatrial junction. Lungs remain hyperaerated and clear. Small right pleural effusion is stable. No pneumothorax. Aorta remains somewhat prominent due to tortuosity and rotation of the thorax. IMPRESSION: New right upper extremity PICC with its tip at the cavoatrial junction. No active cardiopulmonary disease. Electronically Signed   By: Jolaine Click M.D.   On: 05/09/2018 07:37   Dg Abd 2 Views  Result Date: 05/05/2018 CLINICAL DATA:  Vomiting.  History of right lumpectomy. EXAM: ABDOMEN - 2 VIEW COMPARISON:  05/04/2018 FINDINGS: Improve dilation of small bowel loops with residual borderline gas-filled distended small bowel loops in the left upper quadrant of the abdomen. Normal colonic bowel gas pattern. No evidence of free intra-abdominal gas. Normal cardiac silhouette. Tortuosity of the thoracic aorta. Right pleural reflection versus small pleural effusion. No lobar airspace consolidation. Enteric catheter tip within the expected location the gastric body, side hole at the GE junction level. Right PICC line terminates in the expected location of the cavoatrial junction. IMPRESSION: Improved appearance of small-bowel obstruction. Enteric catheter with side hole at the level of the GE junction. Advancement may be considered. Possible small right pleural effusion. Electronically Signed   By: Ted Mcalpine M.D.   On: 05/05/2018 12:51   Dg Abd Portable 1v-small Bowel Obstruction Protocol-initial, 8 Hr Delay  Result Date: 05/29/2018 CLINICAL DATA:  8 hour delay small-bowel obstruction film. EXAM: PORTABLE ABDOMEN - 1 VIEW COMPARISON:  05/29/2018 FINDINGS: Contrast material demonstrated throughout the colon. No small or large bowel distention is identified. Enteric tube tip is in the  left upper quadrant consistent with location in the upper stomach. Proximal side hole projects at or just above the expected location of the EG junction. No change in position. IMPRESSION: Contrast material throughout the colon. No evidence of bowel obstruction. Electronically Signed   By: Burman Nieves M.D.   On: 05/29/2018 23:44   Dg Abd Portable 1v  Result Date: 05/29/2018 CLINICAL DATA:  Small bowel obstruction. EXAM: PORTABLE ABDOMEN - 1 VIEW COMPARISON:  One-view abdomen 05/28/2018 and 05/27/2018. FINDINGS: Bowel gas pattern is normal. No significant dilated loops of bowel remain. There is no free air. The side port of the NG tube is above the GE junction and could be advanced for more optimal positioning. The lung bases are clear. IMPRESSION: 1. Normal bowel gas pattern. No residual dilated loops of small bowel. 2. The side port of the NG tube is above the GE junction. Electronically Signed  By: Marin Roberts M.D.   On: 05/29/2018 10:42   Dg Abd Portable 1v  Result Date: 05/28/2018 CLINICAL DATA:  Small-bowel obstruction EXAM: PORTABLE ABDOMEN - 1 VIEW COMPARISON:  Portable exam 0746 hours compared to 05/27/2018 FINDINGS: Scattered gas and minimal stool in colon. Few air-filled loops of small bowel in the LEFT mid abdomen, decreased in prominence since previous exam. Single loop demonstrates questionable mild wall bowel wall thickening. Tip of nasogastric tube projects over mid stomach. Bones appear demineralized. IMPRESSION: Decreased small bowel distention since prior study. Electronically Signed   By: Ulyses Southward M.D.   On: 05/28/2018 08:16   Dg Abd Portable 1v  Result Date: 05/27/2018 CLINICAL DATA:  Small bowel obstruction EXAM: PORTABLE ABDOMEN - 1 VIEW COMPARISON:  05/24/2018 FINDINGS: There is a nasogastric tube with the tip projecting over the stomach. There is mild gaseous distension of the small bowel and colon without evidence of obstruction. There is no evidence of  pneumoperitoneum, portal venous gas or pneumatosis. There are no pathologic calcifications along the expected course of the ureters. The osseous structures are unremarkable. IMPRESSION: There is a nasogastric tube with the tip projecting over the stomach. There is mild gaseous distension of the small bowel and colon without evidence of obstruction. Electronically Signed   By: Elige Ko   On: 05/27/2018 09:44   Dg Abd Portable 1v  Result Date: 05/24/2018 CLINICAL DATA:  Follow-up small bowel obstruction. EXAM: PORTABLE ABDOMEN - 1 VIEW COMPARISON:  Portable supine abdominal radiograph of May 21, 2018 FINDINGS: The colonic stool burden is moderate. There are loops of mildly distended gas-filled small bowel to the left of midline. There is gas and stool in the rectum. There is no free extraluminal gas. The esophagogastric tube tip in proximal port project below the GE junction. The bony structures exhibit no acute abnormality. IMPRESSION: There are a few loops of mildly distended gas-filled small bowel to the left of midline. The colonic stool burden and gas pattern is within the limits of normal. There is no evidence of perforation. Electronically Signed   By: Klay  [place] M.D.   On: 05/24/2018 09:25   Dg Abd Portable 1v-small Bowel Obstruction Protocol-initial, 8 Hr Delay  Result Date: 05/21/2018 CLINICAL DATA:  Small-bowel obstruction.  Follow-up exam. EXAM: PORTABLE ABDOMEN - 1 VIEW COMPARISON:  05/20/2018 FINDINGS: Significant improvement in small bowel dilation is noted since the prior study. The small bowel obstruction has essentially resolved radiographically. Mild generalized increased stool burden in the colon, unchanged from the previous exam. Midline abdominal wall surgical staples are stable. Nasogastric tube is well positioned, tip in the mid stomach. IMPRESSION: 1. Significant interval improvement from the previous day's exam. Small-bowel obstruction appears resolved radiographically.  Electronically Signed   By: Amie Portland M.D.   On: 05/21/2018 13:12   Dg Abd Portable 1v  Result Date: 05/17/2018 CLINICAL DATA:  Small-bowel obstruction. EXAM: PORTABLE ABDOMEN - 1 VIEW COMPARISON:  05/16/2018.  05/15/2018.  CT 04/30/2018. FINDINGS: Surgical staples noted over the abdomen. NG tube noted with its tip in the stomach. Distended loops of small and large bowel are again noted. Similar findings noted on prior exam. No free air. Degenerative change in osteopenia lumbar spine and both hips. Prostate calcifications are noted. Aortoiliac atherosclerotic vascular calcification. Diffuse osteopenia degenerative changes lumbar spine and both hips. Mild blunting of the right costophrenic angle again noted suggesting scarring and/or tiny effusion. IMPRESSION: NG tube noted with its tip in the stomach. Dilated loops of small and  large bowel are again noted without significant interim change. No free air identified. Electronically Signed   By: Maisie Fus  Register   On: 05/17/2018 07:32   Dg Abd Portable 1v  Result Date: 05/15/2018 CLINICAL DATA:  NG tube placement EXAM: PORTABLE ABDOMEN - 1 VIEW COMPARISON:  05/15/2018 FINDINGS: NG tube is in placed into the fundus of the stomach. Continued gaseous distention of visualized upper abdominal bowel loops. IMPRESSION: NG tube in the fundus of the stomach Electronically Signed   By: Charlett Nose M.D.   On: 05/15/2018 20:11   Dg Abd Portable 1v  Result Date: 05/15/2018 CLINICAL DATA:  Small bowel obstruction. EXAM: PORTABLE ABDOMEN - 1 VIEW COMPARISON:  One-view abdomen 05/14/2018 FINDINGS: Patient's retained left.  Heart size normal.  Lung bases are clear. Extend loops of large and small bowel are stable. There is no free air or pneumatosis. Surgical clips are noted. IMPRESSION: 1. Similar appearance of distended large and small bowel consistent with obstruction or ileus. Electronically Signed   By: Marin Roberts M.D.   On: 05/15/2018 08:10   Dg Abd  Portable 1v  Result Date: 05/14/2018 CLINICAL DATA:  Abdominal pain EXAM: PORTABLE ABDOMEN - 1 VIEW COMPARISON:  05/13/2018 FINDINGS: Interval NG tube removal. Some interval improvement in gaseous distention of the bowel. Stool and air throughout the transverse colon. Postop staples again noted. Air in the rectum. No significant interval change. Degenerative changes of the spine. Basilar chronic interstitial changes and atelectasis. IMPRESSION: NG tube removed. Stable nonspecific bowel gas pattern, suspect resolving obstruction or ileus. Little interval change. Electronically Signed   By: Judie Petit.  Shick M.D.   On: 05/14/2018 10:23   Dg Abd Portable 1v  Result Date: 05/13/2018 CLINICAL DATA:  Abdominal pain, check NG placement EXAM: PORTABLE ABDOMEN - 1 VIEW COMPARISON:  05/09/2018 FINDINGS: Nasogastric catheter is noted with the tip in the stomach. Proximal side port lies in the distal esophagus. This is stable from the prior exam. Scattered large and small bowel gas is noted. Postsurgical changes are now seen. Mild small bowel dilatation is noted which may be related to a postoperative ileus. IMPRESSION: Mild postoperative ileus. Nasogastric catheter as described. Electronically Signed   By: Alcide Clever M.D.   On: 05/13/2018 09:56   Dg Abd Portable 1v  Result Date: 05/09/2018 CLINICAL DATA:  Small-bowel obstruction EXAM: PORTABLE ABDOMEN - 1 VIEW COMPARISON:  05/08/2018; 05/06/2018; 05/05/2018 FINDINGS: Re-demonstrated gaseous distention of several loops of small bowel with index loop of small bowel within the left mid hemiabdomen measuring 3.6 cm in diameter. Moderate colonic stool burden. No supine evidence of pneumoperitoneum. No pneumatosis or portal venous gas. Presumed calcified fibroid overlies the left hemipelvis. Stable position of support apparatus. No acute osseus abnormalities. IMPRESSION: No change to slight improvement in suspected small-bowel obstruction. Electronically Signed   By: Simonne Come M.D.   On: 05/09/2018 07:47   Dg Abd Portable 1v  Result Date: 05/08/2018 CLINICAL DATA:  Follow up small bowel obstruction EXAM: PORTABLE ABDOMEN - 1 VIEW COMPARISON:  05/06/2018 FINDINGS: Scattered large and small bowel gas is noted. Multiple dilated loops of small bowel are again identified and stable in appearance. Nasogastric catheter is again noted within the stomach. No free air is seen. Degenerative changes of the lumbar spine are noted. IMPRESSION: Persistent small bowel dilatation. No new focal abnormality is noted. Electronically Signed   By: Alcide Clever M.D.   On: 05/08/2018 08:36   Dg Abd Portable 1v  Result Date: 05/06/2018 CLINICAL  DATA:  Small bowel obstruction EXAM: PORTABLE ABDOMEN - 1 VIEW COMPARISON:  Portable exam at 0628 hours compared to 05/05/2018 FINDINGS: Stool scattered throughout colon. Persistent dilatation of small bowel loops in the LEFT mid abdomen up to 4.9 cm diameter. No bowel wall thickening. Bones demineralized. No definite renal calcifications. IMPRESSION: Persistent small bowel dilatation consistent with small bowel obstruction. Little interval change. Electronically Signed   By: Ulyses Southward M.D.   On: 05/06/2018 10:21   [place] Ekg Site Rite  Result Date: 05/04/2018 If Site Rite image not attached, placement could not be confirmed due to current cardiac rhythm.    PERTINENT LAB RESULTS: CBC: No results for input(s): WBC, HGB, HCT, PLT in the last 72 hours. CMET CMP     Component Value Date/Time   NA 138 06/02/2018 0506   K 4.1 06/02/2018 0506   CL 111 06/02/2018 0506   CO2 22 06/02/2018 0506   GLUCOSE 87 06/02/2018 0506   BUN 28 (H) 06/02/2018 0506   CREATININE 0.65 06/02/2018 0506   CALCIUM 8.8 (L) 06/02/2018 0506   PROT 6.2 (L) 06/02/2018 0506   ALBUMIN 2.5 (L) 06/02/2018 0506   AST 15 06/02/2018 0506   ALT 11 06/02/2018 0506   ALKPHOS 89 06/02/2018 0506   BILITOT 0.3 06/02/2018 0506   GFRNONAA >60 06/02/2018 0506   GFRAA >60  06/02/2018 0506    GFR Estimated Creatinine Clearance: 61.8 mL/min (by C-G formula based on SCr of 0.65 mg/dL). No results for input(s): LIPASE, AMYLASE in the last 72 hours. No results for input(s): CKTOTAL, CKMB, CKMBINDEX, TROPONINI in the last 72 hours. Invalid input(s): POCBNP No results for input(s): DDIMER in the last 72 hours. No results for input(s): HGBA1C in the last 72 hours. No results for input(s): CHOL, HDL, LDLCALC, TRIG, CHOLHDL, LDLDIRECT in the last 72 hours. No results for input(s): TSH, T4TOTAL, T3FREE, THYROIDAB in the last 72 hours.  Invalid input(s): FREET3 No results for input(s): VITAMINB12, FOLATE, FERRITIN, TIBC, IRON, RETICCTPCT in the last 72 hours. Coags: No results for input(s): INR in the last 72 hours.  Invalid input(s): PT Microbiology: No results found for this or any previous visit (from the past 240 hour(s)).  FURTHER DISCHARGE INSTRUCTIONS:  Get Medicines reviewed and adjusted: Please take all your medications with you for your next visit with your Primary MD  Laboratory/radiological data: Please request your Primary MD to go over all hospital tests and procedure/radiological results at the follow up, please ask your Primary MD to get all Hospital records sent to his/her office.  In some cases, they will be blood work, cultures and biopsy results pending at the time of your discharge. Please request that your primary care M.D. goes through all the records of your hospital data and follows up on these results.  Also Note the following: If you experience worsening of your admission symptoms, develop shortness of breath, life threatening emergency, suicidal or homicidal thoughts you must seek medical attention immediately by calling 911 or calling your MD immediately  if symptoms less severe.  You must read complete instructions/literature along with all the possible adverse reactions/side effects for all the Medicines you take and that have been  prescribed to you. Take any new Medicines after you have completely understood and accpet all the possible adverse reactions/side effects.   Do not drive when taking Pain medications or sleeping medications (Benzodaizepines)  Do not take more than prescribed Pain, Sleep and Anxiety Medications. It is not advisable to combine anxiety,sleep and  pain medications without talking with your primary care practitioner  Special Instructions: If you have smoked or chewed Tobacco  in the last 2 yrs please stop smoking, stop any regular Alcohol  and or any Recreational drug use.  Wear Seat belts while driving.  Please note: You were cared for by a hospitalist during your hospital stay. Once you are discharged, your primary care physician will handle any further medical issues. Please note that NO REFILLS for any discharge medications will be authorized once you are discharged, as it is imperative that you return to your primary care physician (or establish a relationship with a primary care physician if you do not have one) for your post hospital discharge needs so that they can reassess your need for medications and monitor your lab values.  Total Time spent coordinating discharge including counseling, education and face to face time equals 35 minutes.  Signed: Shanker Ghimire 06/03/2018 9:00 AM

## 2018-06-03 NOTE — Progress Notes (Signed)
PT Cancellation Note  Patient Details Name: Jeffrey Davila MRN: [id number] DOB: 05/18/1951   Cancelled Treatment:    Reason Eval/Treat Not Completed: (P) Other (comment)(Pt to d/c to next LOC will defer PT needs to next venue.  )   Romilda Proby [name] 06/03/2018, 3:47 PM  Joycelyn Rua, PTA Acute Rehabilitation Services Pager [phone] Office [phone]

## 2018-06-03 NOTE — Progress Notes (Signed)
Medical Transport came to pick patient up and bring him to SNF.  They took Vitals and his BP was 90/66.  They said that the BP was too low to transport patient according to they're protocol.  Then sister agreed BP was to low and wanted patient to stay another night to raise his BP.  I reassured transport and sister that this was his baseline BP and showed them both his records.  Transport was saying they needed his BP higher for transport.  So I got patient OOB walked him and put him in the chair.  His BP went up to 93/63.  Transport was satisfied.  I reassured his sister that this was his baseline and if we thought it wasn't safe he would not be discharged.

## 2018-06-03 NOTE — Progress Notes (Addendum)
Patient will DC to: [facility] Anticipated DC date: 06/03/18 Family notified: Sister, Dorothy Transport by: Sharin Mons    Per MD patient ready for DC to [facility]. RN, patient, patient's family, and facility notified of DC. Discharge Summary and FL2 sent to facility. RN to call report prior to discharge [phone] Room 164). DC packet on chart. Ambulance transport requested for patient.   CSW will sign off for now as social work intervention is no longer needed. Please consult [place] again if new needs arise.  Cristobal Goldmann, LCSW Clinical Social Worker [phone]

## 2018-06-03 NOTE — Progress Notes (Addendum)
24 Days Post-Op    CC:  SBO  Subjective: Pt eats with sister, but not without her. Sites all look good.  Objective: Vital signs in last 24 hours: Temp:  [98.6 F (37 C)] 98.6 F (37 C) (11/08 0452) Pulse Rate:  [81-93] 81 (11/08 0452) Resp:  [16-18] 16 (11/08 0452) BP: (85-94)/(55-72) 85/55 (11/08 0452) SpO2:  [100 %] 100 % (11/08 0452) Weight:  [48.1 kg] 48.1 kg (11/08 0700) Last BM Date: 05/30/18 PO 550 IV 500 Nothing else recorded Afebrile, VSS No labs Intake/Output from previous day: 11/07 0701 - 11/08 0700 In: 1050.2 [P.O.:550; I.V.:395.2; IV Piggyback:105] Out: -  Intake/Output this shift: No intake/output data recorded.  General appearance: alert and they are getting him up to chair, he didn't speak and just stared ahead while I was in the room. He did well standing with 2 people assist Resp: clear to auscultation bilaterally GI: soft non tender, no distension.  Incision is well healed.  Lab Results:  No results for input(s): WBC, HGB, HCT, PLT in the last 72 hours.  BMET Recent Labs    06/02/18 0506  NA 138  K 4.1  CL 111  CO2 22  GLUCOSE 87  BUN 28*  CREATININE 0.65  CALCIUM 8.8*   PT/INR No results for input(s): LABPROT, INR in the last 72 hours.  Recent Labs  Lab 05/30/18 1002 06/02/18 0506  AST 11* 15  ALT 10 11  ALKPHOS 104 89  BILITOT 0.4 0.3  PROT 7.4 6.2*  ALBUMIN 2.7* 2.5*     Lipase     Component Value Date/Time   LIPASE 204 (H) 04/29/2018 0904     Medications: . benztropine  1 mg Oral BID  . Chlorhexidine Gluconate Cloth  6 each Topical 2 times per day on Tue  . feeding supplement  1 Container Oral TID BM  . feeding supplement (PRO-STAT SUGAR FREE 64)  30 mL Oral TID WC  . heparin  5,000 Units Subcutaneous Q8H  . lactose free nutrition  237 mL Oral TID WC  . lip balm  1 application Topical BID  . OLANZapine zydis  10 mg Oral BID  . polyethylene glycol  17 g Oral Daily  . polyvinyl alcohol  1 drop Both Eyes TID  .  senna-docusate  2 tablet Oral QHS   . sodium chloride Stopped (05/31/18 0506)  . TPN ADULT (ION) 35 mL/hr at 06/02/18 1728  . valproate sodium 250 mg (06/03/18 0527)   Assessment/Plan Hx of prior lung surgery Acute metabolic encephalopathy Anemia- transfused 10/14 -hgstable (11/4) AKI- resolved Hypertension- controlled Hypokalemia/Hyponatremia- resolved Hx of schizophrenia- cogentin/Zyprexa - very difficult to get answers from him Deconditioning - bedbound (?) Malnutrition - severe- TNA. 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 Continue TPN for now as we have no data to demonstrate whether he is eating enough to sustain himself.. Continue bowel regimen with daily miralax and dulcolax suppository.   ID -rocephin 10/4>>10/11, flagyl 10/6>>10/11 FEN -TPN, dysphagia 1 VTE -SCDs, SQ heparin Foley -condom cath  PLan:   Will defer to Medicine on TNA and disposition        LOS: 35 days    Jove Beyl 06/03/2018 [phone]

## 2018-06-03 NOTE — Progress Notes (Signed)
Nutrition Follow-up  DOCUMENTATION CODES:   Severe malnutrition in context of chronic illness, Underweight  INTERVENTION:   - Continue calorie count through 11/8 dinner - Continue Boost Breeze TID - Continue ProStat TID - Continue Magic Cup  NUTRITION DIAGNOSIS:   Severe Malnutrition related to chronic illness(persistent post-op ileus) as evidenced by severe fat depletion, severe muscle depletion, percent weight loss(14.7% weight loss in less than 2 months).  Ongoing  GOAL:   Patient will meet greater than or equal to 90% of their needs  Not meeting  MONITOR:   Diet advancement, Weight trends, I & O's, Labs, Skin   Diet advancement: Dysphagia I, thin liquids Weight trends: 48.1 kg 11/8, up from 46.4 on 11/4 Labs: BUN 28 (trending down), tProtein 6.2 Skin: no change  ASSESSMENT:    Jeffrey Davila  is a 67 y.o. male, past medical history of schizophrenia, hypertension, BPH, who lives with his sister at home, for last 4 years, he is wheelchair dependent at baseline, but able to transfer from bed to chair, communicative and appropriate per sister, she reports over the last few days patient has been less interactive, sleeping more often during the day, and over the last 2 days almost with no significant oral intake, which prompted her to come to ED.  11/8 follow up:  Meds: Boost Breeze TID, ProStat TID, miralax 1 pkt daily, senokot-S 2 tablets daily  Pt seated in chair at time of visit. Unable to answer most questions. Would like to continue receiving nutritional supplements as ordered. Denies nausea or vomiting.  Reviewed meal tickets in envelope for calorie count  On 11/7, pt consumed approximately 630 calories and 23.5 g protein over 2 meals documented. With ProStat x3, pt met 56% and 81% of daily estimated needs, respectively.  On 11/8, pt consumed approximately 330 calories and 15 g protein over 1 meal documented.   Will continue calorie count through dinner  today.  Diet Order:   Diet Order            Diet - low sodium heart healthy        DIET - DYS 1 Room service appropriate? Yes; Fluid consistency: Thin  Diet effective now              EDUCATION NEEDS:  No education needs have been identified at this time  Skin:  Skin Assessment: Skin Integrity Issues: Skin Integrity Issues:: Incisions Incisions: closed incision to abdomen  Last BM:  11/3 (large type 7)  Height:  Ht Readings from Last 1 Encounters:  05/30/18 _0  (1.676 m)    Weight:  Wt Readings from Last 1 Encounters:  06/03/18 48.1 kg    Ideal Body Weight:  64.5 kg  BMI:  Body mass index is 17.12 kg/m.  Estimated Nutritional Needs:   Kcal:  1650-1850  Protein:  85-100 grams  Fluid:  >/= 1.6 L  Althea Grimmer, MS, RDN, LDN On-call pager: [phone]

## 2018-06-03 NOTE — Progress Notes (Signed)
Discharge instructions, prescriptions, and care notes added to chart and given to Transport. IV team removed PICC line early this afternoon.  Telemetry discontinued and Centralized Telemetry was notified. Pt left the floor via stretcher with sister and transport  in stable condition.

## 2018-06-03 NOTE — Clinical Social Work Placement (Signed)
   CLINICAL SOCIAL WORK PLACEMENT  NOTE  Date:  06/03/2018  Patient Details  Name: Jeffrey Davila MRN: [id number] Date of Birth: 10-07-1950  Clinical Social Work is seeking post-discharge placement for this patient at the Skilled  Nursing Facility level of care (*CSW will initial, date and re-position this form in  chart as items are completed):  Yes   Patient/family provided with [place] Clinical Social Work Department's list of facilities offering this level of care within the geographic area requested by the patient (or if unable, by the patient's family).  Yes   Patient/family informed of their freedom to choose among providers that offer the needed level of care, that participate in Medicare, Medicaid or managed care program needed by the patient, have an available bed and are willing to accept the patient.  Yes   Patient/family informed of [facility]'s ownership interest in [facility] and [facility], as well as of the fact that they are under no obligation to receive care at these facilities.  PASRR submitted to E[facility] on 05/19/18     PASRR number received on 05/19/18     Existing PASRR number confirmed on       FL2 transmitted to all facilities in geographic area requested by pt/family on 05/19/18     FL2 transmitted to all facilities within larger geographic area on       Patient informed that his/her managed care company has contracts with or will negotiate with certain facilities, including the following:        Yes   Patient/family informed of bed offers received.  Patient chooses bed at [facility]     Physician recommends and patient chooses bed at      Patient to be transferred to [facility] on 06/03/18.  Patient to be transferred to facility by [facility]     Patient family notified on 06/03/18 of transfer.  Name of family member notified:  Sister, Dorothy     PHYSICIAN       Additional Comment:     _______________________________________________ Mearl Latin, LCSW 06/03/2018, 10:01 AM

## 2018-09-07 ENCOUNTER — Ambulatory Visit: Payer: Medicare Other | Admitting: Emergency Medicine

## 2019-10-27 IMAGING — CT CT ABD-PELV W/O CM
2 of 4 series · 15 of 46 positions shown, 17 images · non-contrast
Comparison: None

CLINICAL DATA: Nausea, bilious vomiting

EXAM:
CT ABDOMEN AND PELVIS WITHOUT CONTRAST
TECHNIQUE: Multidetector CT imaging of the abdomen and pelvis was performed
following the standard protocol without IV contrast. Sagittal and
coronal MPR images reconstructed from axial data set. Oral contrast
was not administered.

[Series 3: ap without · axial · non-contrast · 0.58mm/px · z∈[+737,+1097]mm · 12 of 84 slices shown, 14 images]
[im 6/84  soft-tissue]
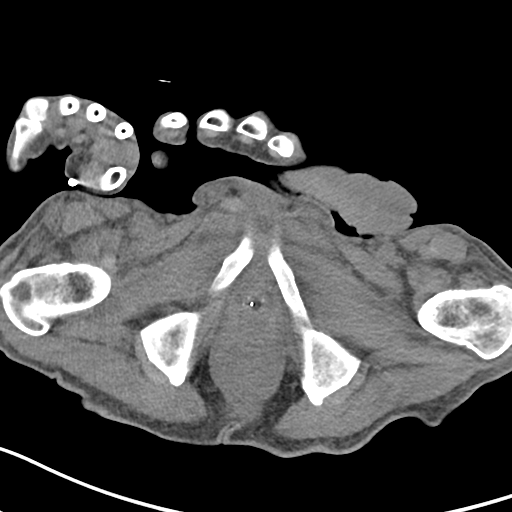
[im 6/84  bone]
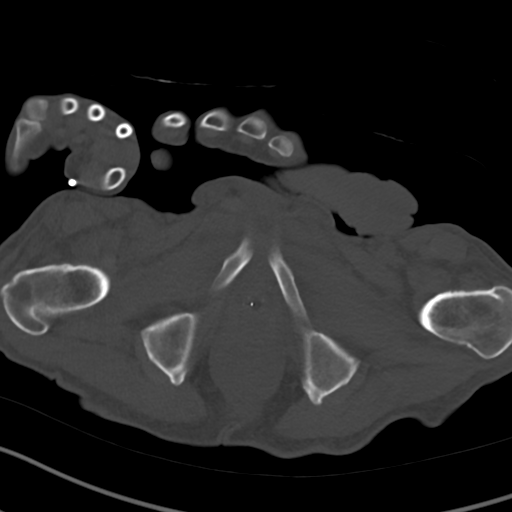
[im 11/84  soft-tissue]
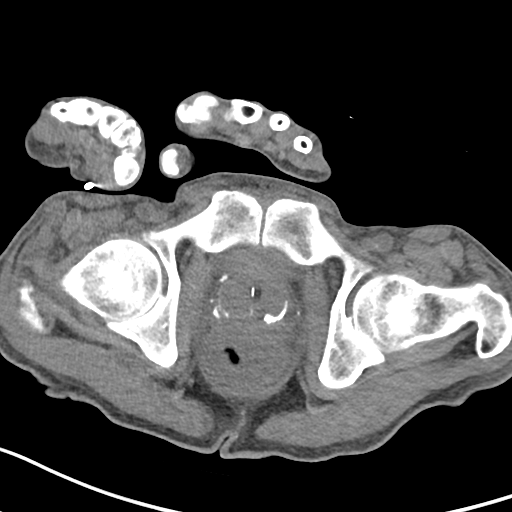
[im 21/84  soft-tissue]
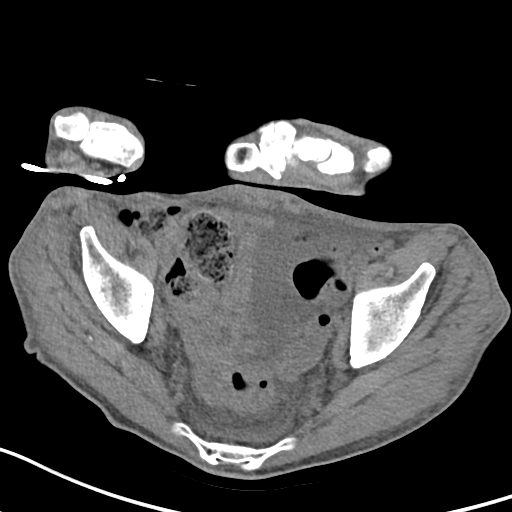
[im 26/84  soft-tissue]
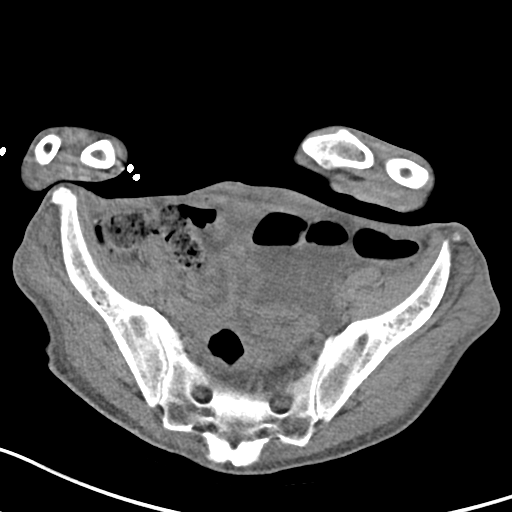
[im 32/84  soft-tissue]
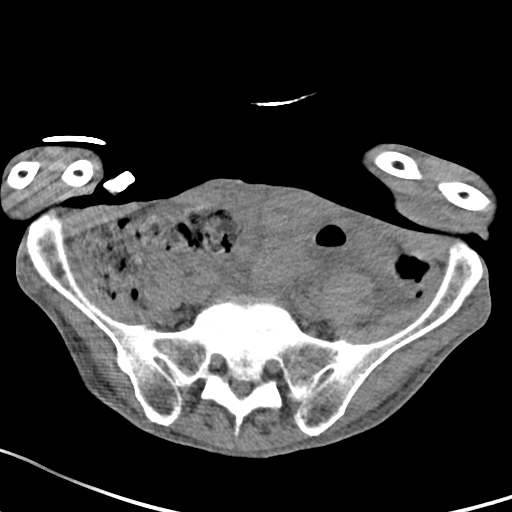
[im 37/84  soft-tissue]
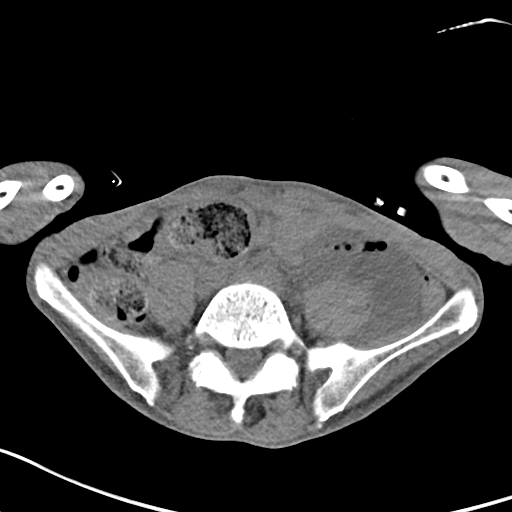
[im 47/84  soft-tissue]
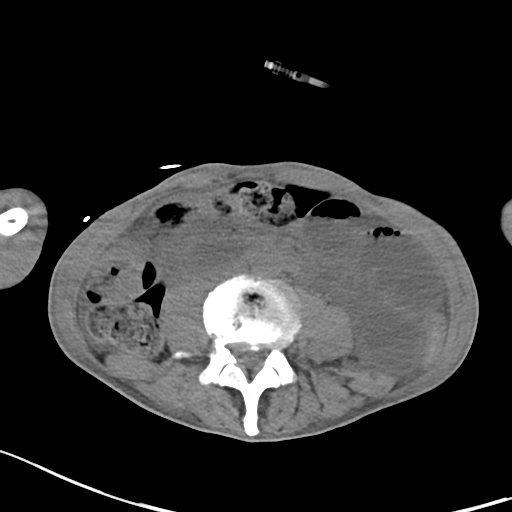
[im 52/84  soft-tissue]
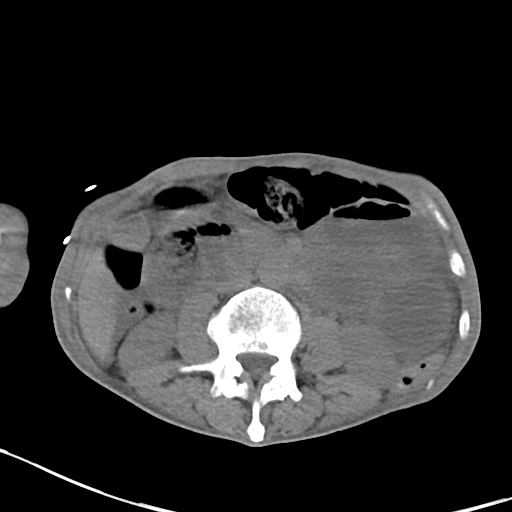
[im 58/84  soft-tissue]
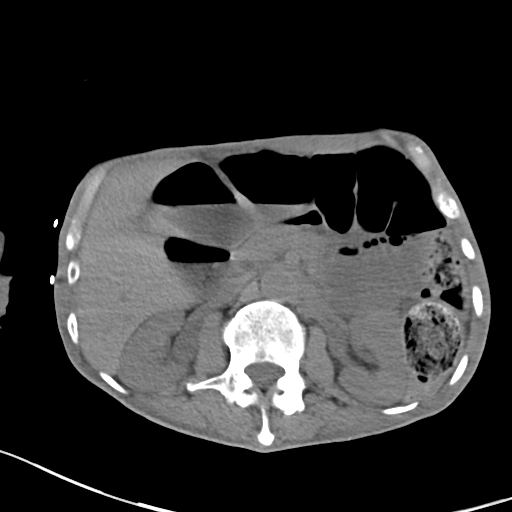
[im 58/84  bone]
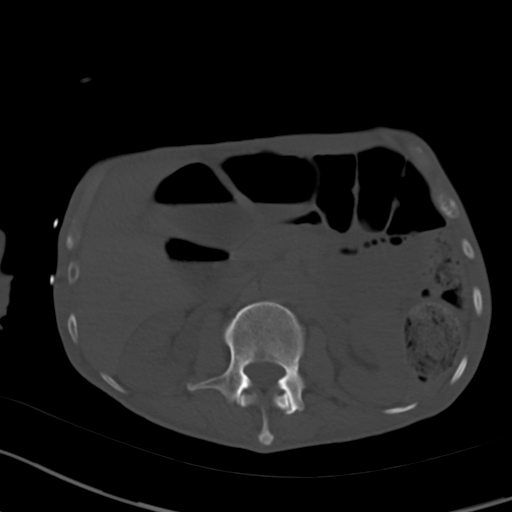
[im 63/84  soft-tissue]
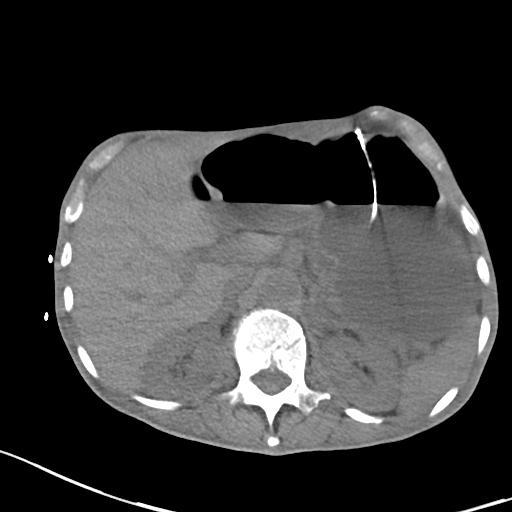
[im 73/84  soft-tissue]
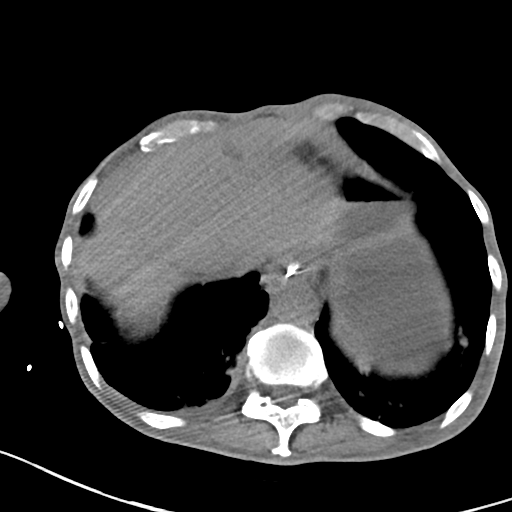
[im 78/84  soft-tissue]
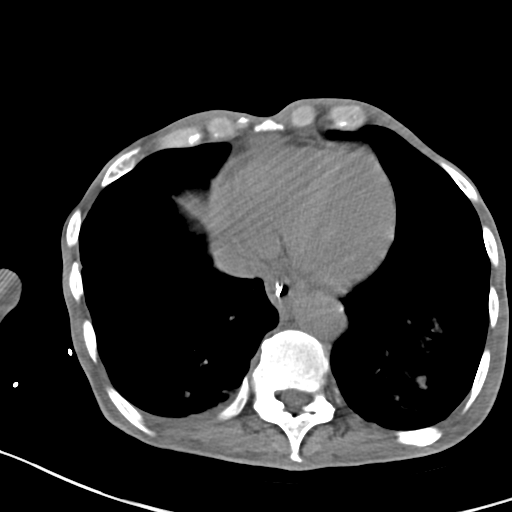

[Series 6: cor · coronal · 0.58mm/px · 3 of 70 slices shown]
[im 24/70  soft-tissue]
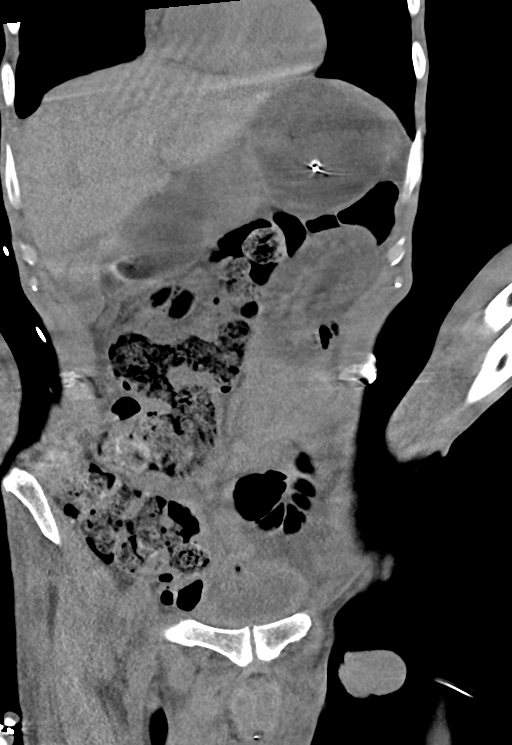
[im 31/70  soft-tissue]
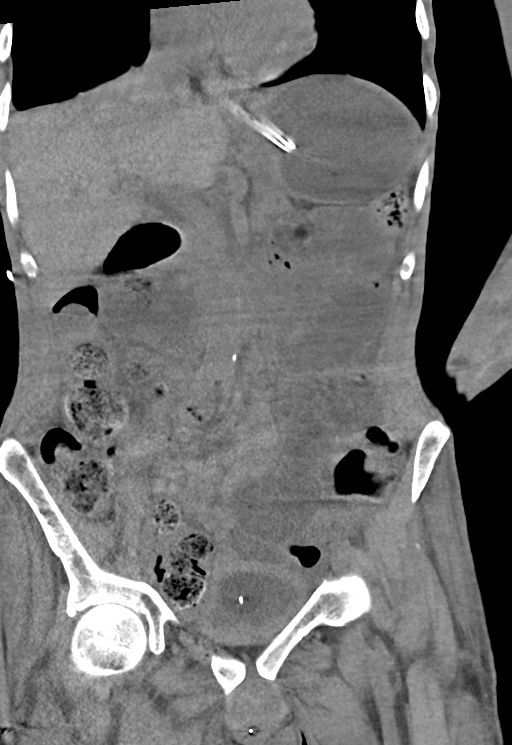
[im 39/70  soft-tissue]
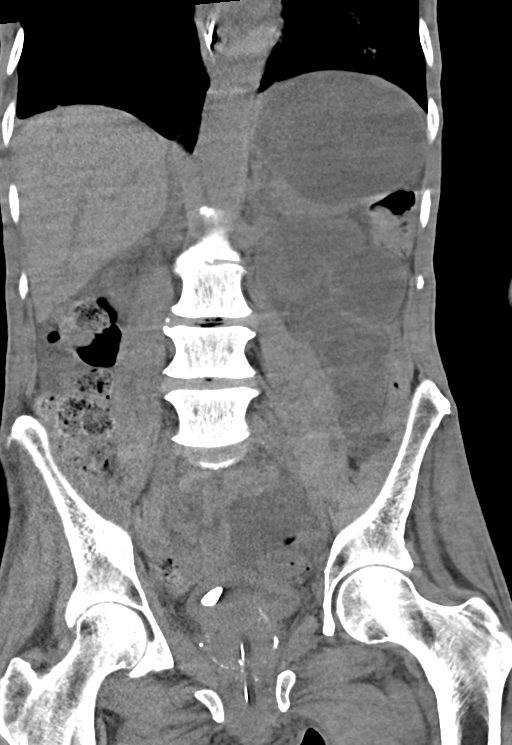

[15 of 46 positions shown; findings below may reference images not displayed]

FINDINGS: Lower chest: Emphysematous changes with BILATERAL lower lobe
infiltrates consistent with either pneumonia or aspiration.

Hepatobiliary: Liver unremarkable. Gallbladder not well visualized
due to streak artifacts, grossly unremarkable.

Pancreas: Normal appearance

Spleen: Normal appearance

Adrenals/Urinary Tract: Question adrenal thickening bilaterally. No
obvious renal mass or hydronephrosis. Ureters not visualized. Foley
catheter decompresses urinary bladder.

Stomach/Bowel: Nasogastric tube in stomach. Low-attenuation fluid
distends stomach and multiple proximal small bowel loops, with small
bowel loops up to 4.1 cm diameter. Distal small bowel loops and
colon are decompressed. Scattered stool throughout colon. Findings
likely represent mid small bowel obstruction. Appendix not
visualized. Suspected rectal wall thickening.

Vascular/Lymphatic: Atherosclerotic calcification aorta. Aorta
normal caliber.

Reproductive: Minimal prostatic enlargement and scattered prostatic
calcifications.

Other: No definite free air or free fluid. Nonspecific stranding of
presacral fat.

Musculoskeletal: No acute osseous findings.
IMPRESSION: Dilated proximal and decompressed distal small bowel loops
compatible with small bowel obstruction, likely in the mid small
bowel.

Due to lack of fat planes, streak artifacts, and lack of contrast,
unable to localize site of obstruction.

No evidence of perforation/free air.

Question rectal wall thickening, recommend correlation with
proctoscopy.

Emphysematous changes with bibasilar pulmonary infiltrates either
representing pneumonia or aspiration.

## 2019-10-29 IMAGING — CR DG ABDOMEN 1V
1 series · 1 of 1 positions shown · non-contrast
Comparison: May 01, 2018 abdominal radiograph and CT abdomen and
pelvis April 30, 2018

CLINICAL DATA: Small bowel obstruction

EXAM:
ABDOMEN - 1 VIEW

[abdomen kub]
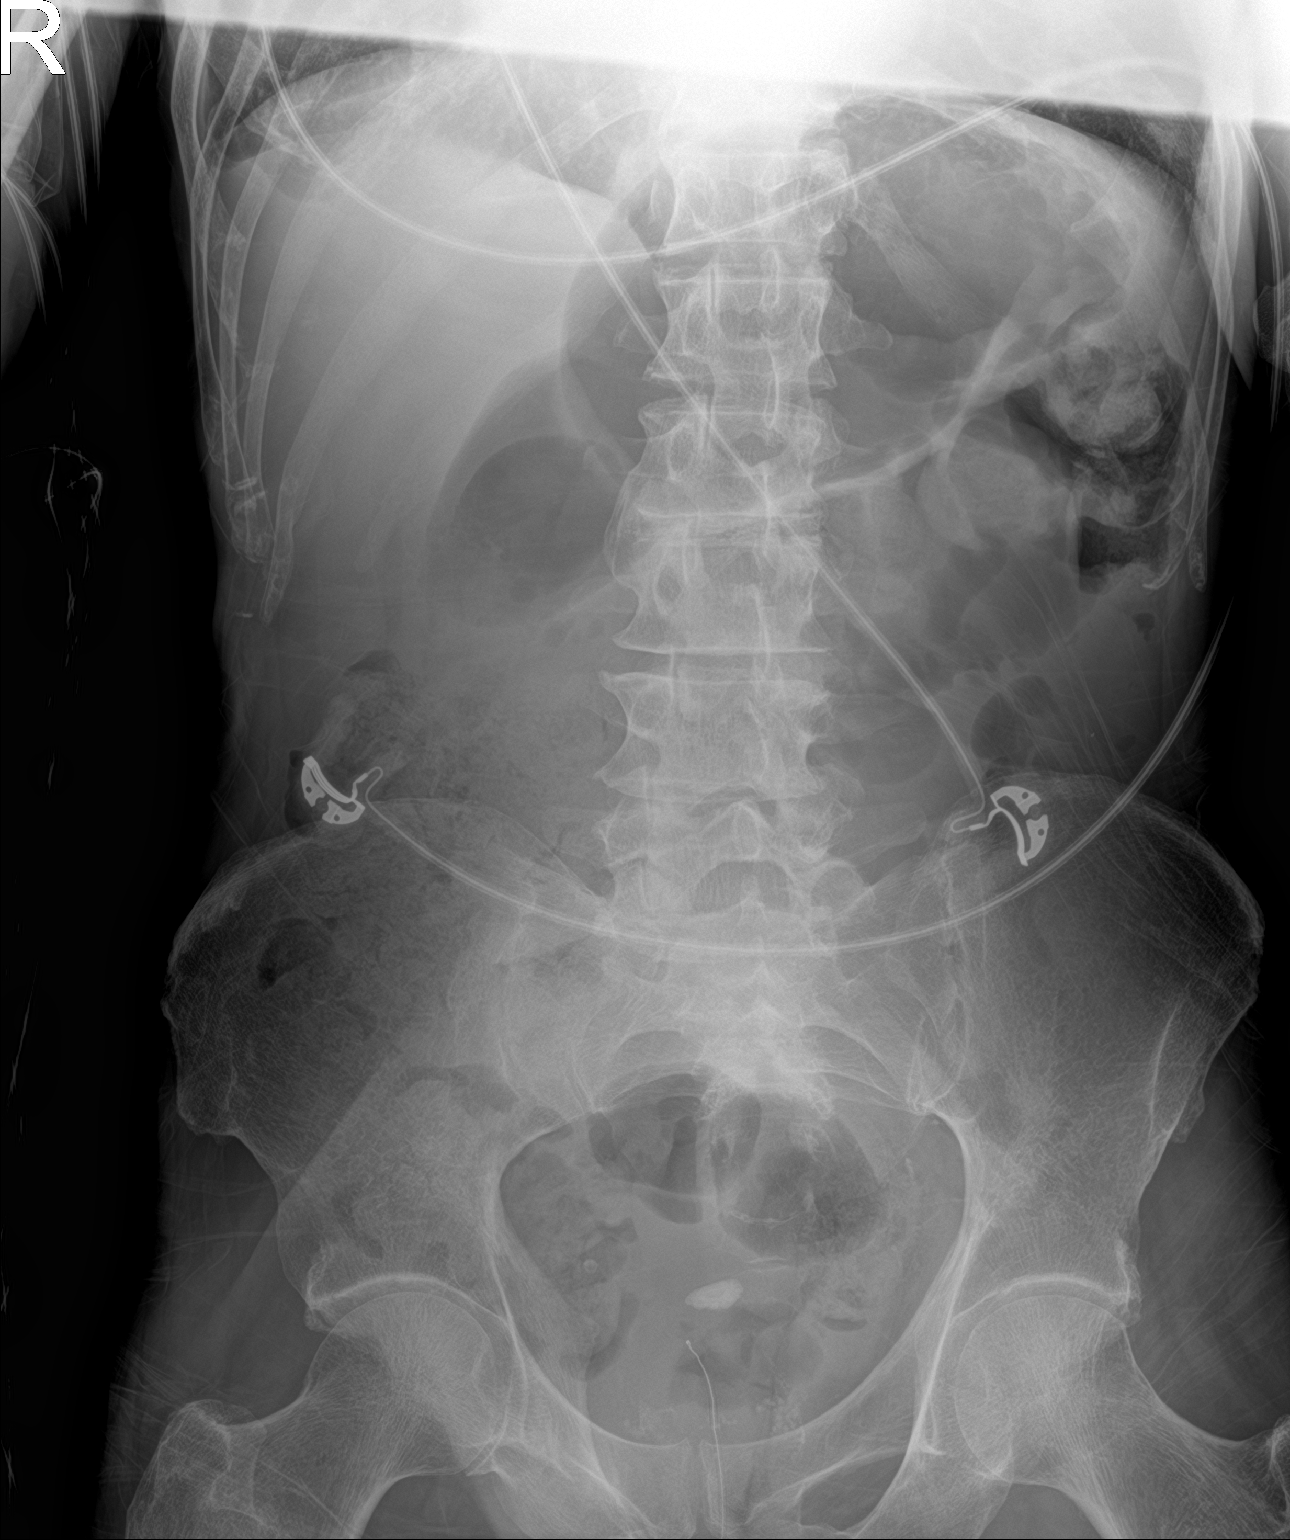

[1 of 1 positions shown; findings below may reference images not displayed]

FINDINGS: Nasogastric tube no longer present. There is slightly less bowel
dilatation in the left upper quadrant compared to 1 day prior. There
is moderate air in the stomach. No bowel dilatation elsewhere. No
air-fluid levels. No free air. There is moderate stool in the colon.
There is atelectatic change in the right lung base.
IMPRESSION: Nasogastric tube no longer appreciable. Less small bowel dilatation
in the left upper quadrant. Question resolving bowel obstruction. No
free air evident.

## 2019-10-30 IMAGING — DX DG ABDOMEN 1V
1 series · 1 of 1 positions shown · non-contrast
Comparison: 05/02/2018

CLINICAL DATA: Small bowel obstruction

EXAM:
ABDOMEN - 1 VIEW

[x abdomen supine]
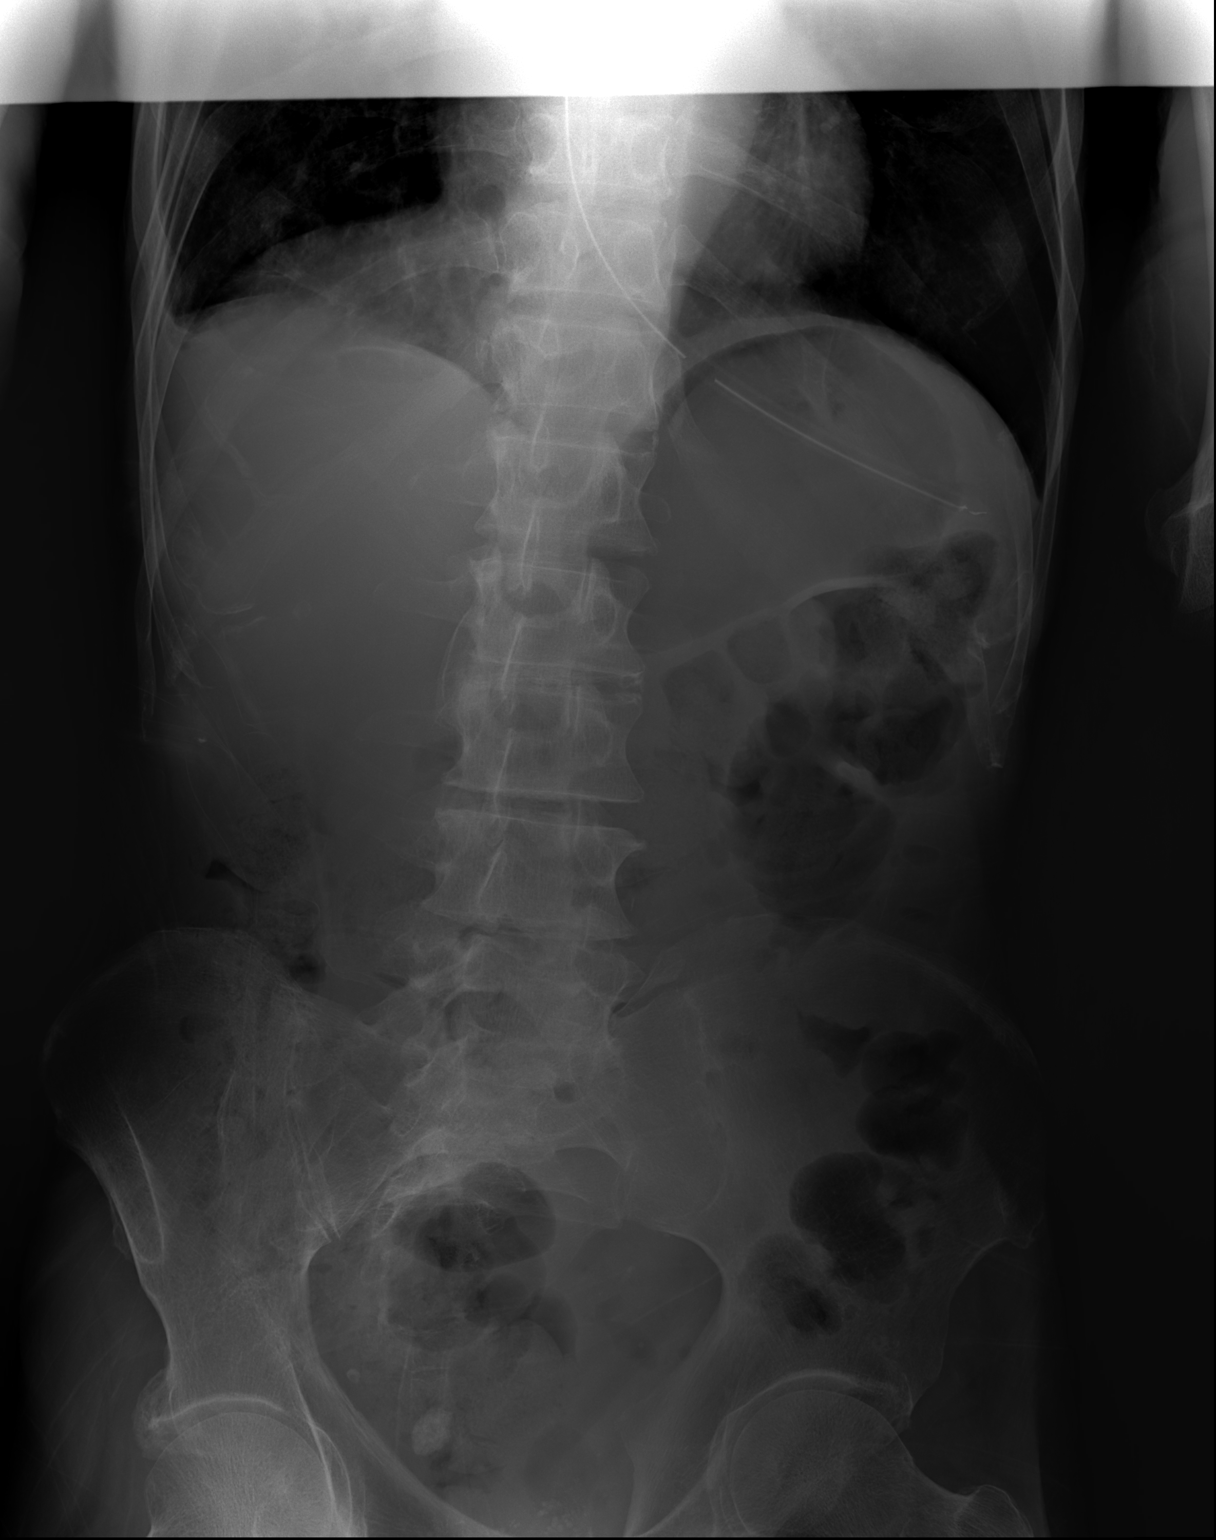

[1 of 1 positions shown; findings below may reference images not displayed]

FINDINGS: NG tube is in the stomach. Gaseous distention of the stomach and
left abdominal small bowel loops. Gas and stool within nondistended
colon. No free air or organomegaly.
IMPRESSION: Gaseous distention of stomach and left abdominal small bowel loops
likely reflecting small bowel obstruction. NG tube is in the
stomach.

## 2019-11-05 IMAGING — DX DG ABD PORTABLE 1V
2 series · 2 of 2 positions shown · non-contrast
Comparison: 05/08/2018; 05/06/2018; 05/05/2018

CLINICAL DATA: Small-bowel obstruction

EXAM:
PORTABLE ABDOMEN - 1 VIEW

[abdomen kub (1 of 2)]
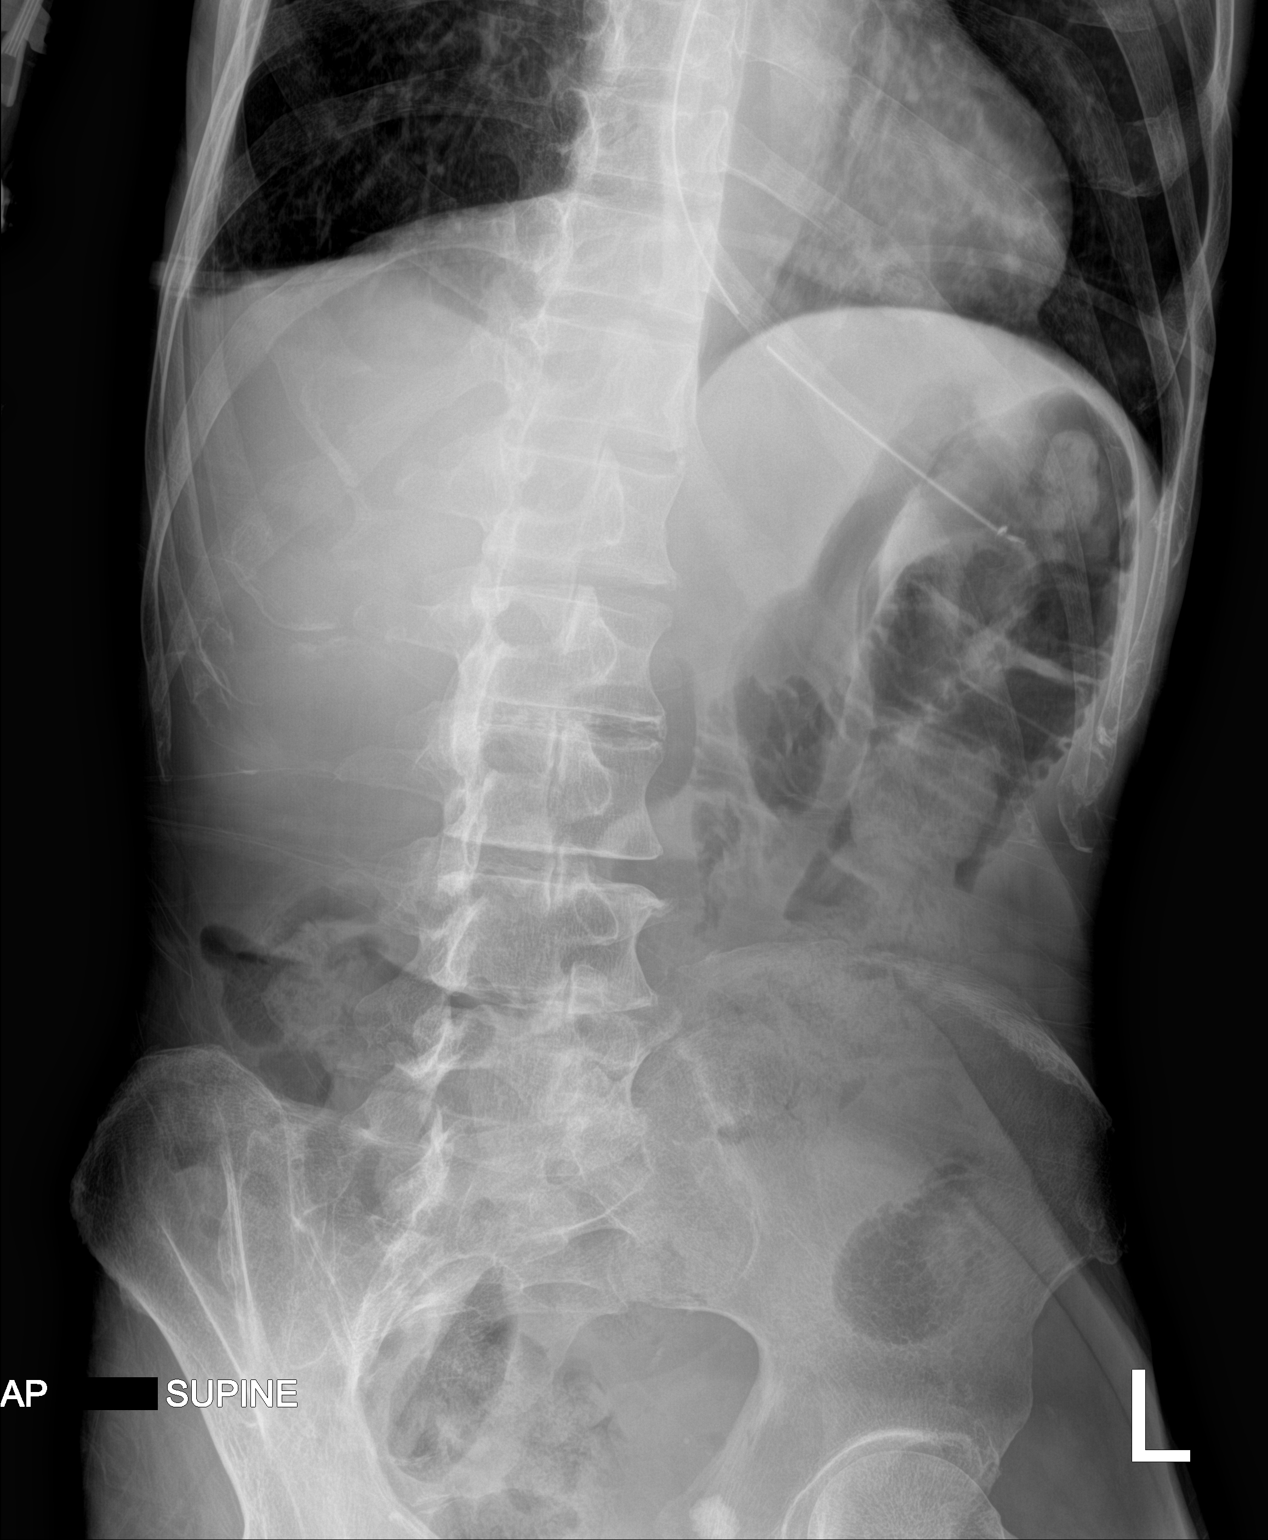

[abdomen kub (2 of 2)]
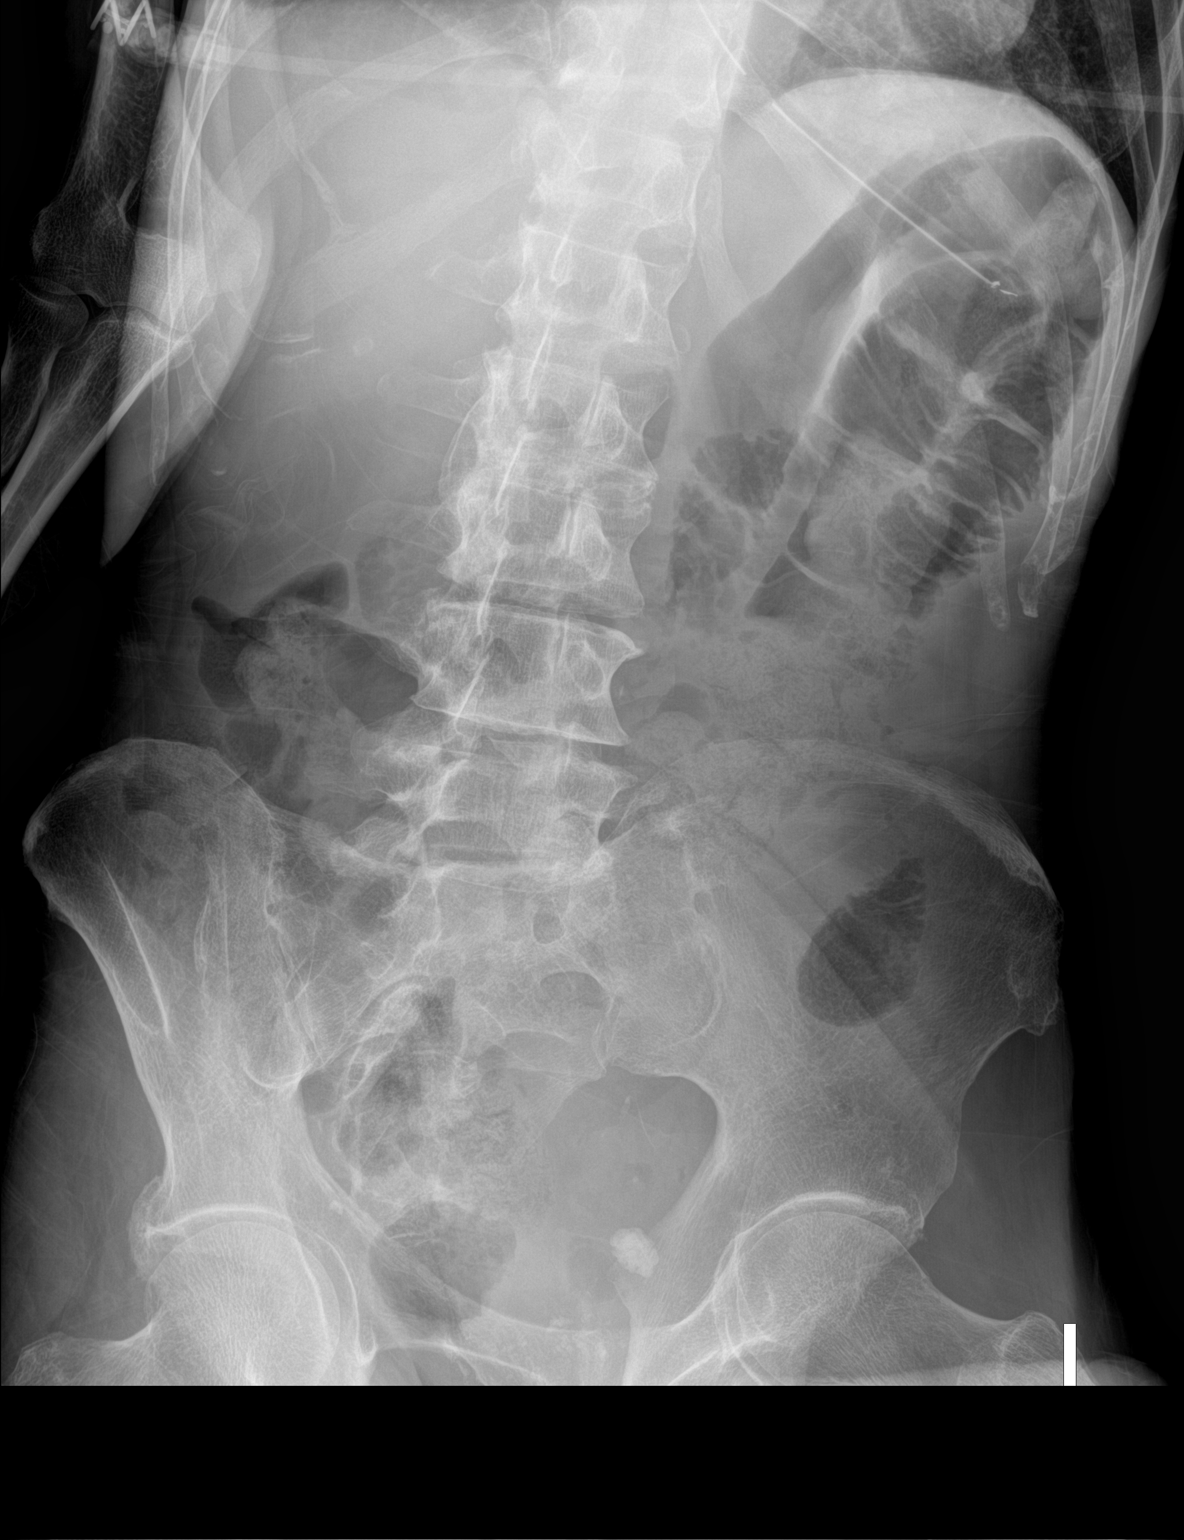

[2 of 2 positions shown; findings below may reference images not displayed]

FINDINGS: Re-demonstrated gaseous distention of several loops of small bowel
with index loop of small bowel within the left mid hemiabdomen
measuring 3.6 cm in diameter. Moderate colonic stool burden.

No supine evidence of pneumoperitoneum. No pneumatosis or portal
venous gas.

Presumed calcified fibroid overlies the left hemipelvis.

Stable position of support apparatus. No acute osseus abnormalities.
IMPRESSION: No change to slight improvement in suspected small-bowel
obstruction.

## 2020-05-10 ENCOUNTER — Other Ambulatory Visit: Payer: Self-pay

## 2020-05-10 ENCOUNTER — Ambulatory Visit (HOSPITAL_COMMUNITY)
Admission: EM | Admit: 2020-05-10 | Discharge: 2020-05-10 | Disposition: A | Discharge status: Home / Self Care | Payer: Medicare Other | Attending: Behavioral Health | Admitting: Behavioral Health

## 2020-05-10 DIAGNOSIS — F319 Bipolar disorder, unspecified: Secondary | ICD-10-CM | POA: Diagnosis present

## 2020-05-10 DIAGNOSIS — F315 Bipolar disorder, current episode depressed, severe, with psychotic features: Secondary | ICD-10-CM

## 2020-05-10 DIAGNOSIS — F0390 Unspecified dementia without behavioral disturbance: Secondary | ICD-10-CM | POA: Insufficient documentation

## 2020-05-10 DIAGNOSIS — R443 Hallucinations, unspecified: Secondary | ICD-10-CM | POA: Insufficient documentation

## 2020-05-10 MED ORDER — OLANZAPINE 5 MG PO TABS: 5.0000 mg | ORAL_TABLET | Freq: Every day | ORAL | Status: DC

## 2020-05-10 MED ORDER — OLANZAPINE 5 MG PO TABS: 5.0000 mg | ORAL_TABLET | Freq: Every day | ORAL | 0 refills | Status: AC

## 2020-05-10 NOTE — BH Assessment (Addendum)
Comprehensive Clinical Assessment (CCA) Note  05/10/2020 Jeffrey Davila [id number]  Pt is a 69 year old divorced male who [facility] accompanied by his sister/legal guardian, Jeffrey JunkerDorothy Davila [phone]. Pt has a diagnosis of bipolar disorder and dementia. Ms Okey DupreCrawford says Pt is here because his prescription of olanzapine needs to be refilled. She says he still has Depakote. She states she contacted Pt's PCP, [place]. [place] but he said this medication needs to be prescribed by a psychiatrist. Pt describes his mood recently as "bad" and acknowledges he has felt depressed and anxious recently. He denies problems with sleep or appetite. He denies current auditory or visual hallucinations but Ms Okey DupreCrawford says Pt does have episodes of hearing voices. He denies current suicidal ideation or history of suicide attempts. Protective factors against suicide include good family support, future orientation, therapeutic relationship, no access to firearms, religious convictions and no prior attempts. Pt denies any history of intentional self-injurious behaviors. Pt denies current homicidal ideation or history of violence. Pt denies history of alcohol or other substance use.  Pt cannot identify any specific stressors. He lives with his legal guardian, her husband and Pt's nephew. Pt reports he has 13 siblings, 2 children and has good family support. Pt denies abuse. He denies legal problems. He denies access to firearms. Pt ambulates with a walker.  Pt is casually dressed, thin and appears older than his age. He is alert and person, place and situation but not date. Pt speaks in a slightly mumbled tone, at moderate volume and normal pace. Motor behavior appears normal. Eye contact is good. Pt's mood is depressed and affect is congruent with mood. Thought process is coherent and relevant. There is no indication Pt is currently responding to internal stimuli or experiencing delusional  thought content. Pt was pleasant and cooperative throughout assessment. Pt and Pt's guardian are very clear there only need is mediation refill.  Visit Diagnosis:   F31.5 Bipolar I disorder, Current or most recent episode depressed, With psychotic features   DISPOSITION: Gave clinical report to Jeffrey MurdochJacqueline Thompson, NP who said she would prescribe 30 days worth of medication and that Pt needs to find an outpatient psychiatrist who can follow Pt. Pt's legal guardian given referrals [facility], [facility], and [facility].    PHQ9 SCORE ONLY 05/10/2020  PHQ-9 Total Score 7    CCA Screening, Triage and Referral (STR)  Patient Reported Information How did you hear about us? Family/Friend  Referral name: Sister: Jeffrey JunkerDorothy Davila  Referral phone number: [phone]   Whom do you see for routine medical problems? Primary Care  Practice/Facility Name: [facility] [place]  Practice/Facility Phone Number: No data recorded Name of Contact: No data recorded Contact Number: No data recorded Contact Fax Number: No data recorded Prescriber Name: No data recorded Prescriber Address (if known): No data recorded  What Is the Reason for Your Visit/Call Today? Pt is here for medication refill  How Long Has This Been Causing You Problems? > than 6 months  What Do You Feel Would Help You the Most Today? Medication   Have You Recently Been in Any Inpatient Treatment (Hospital/Detox/Crisis Center/28-Day Program)? No  Name/Location of Program/Hospital:No data recorded How Long Were You There? No data recorded When Were You Discharged? No data recorded  Have You Ever Received Services From [facility] Before? Yes  Who [facility]? [facility]  in the past   Have You Recently Had Any Thoughts About Hurting Yourself? No  Are You Planning to Commit Suicide/Harm  Yourself At This time? No   Have you Recently Had Thoughts About Hurting Someone Jeffrey Davila? No  Explanation: No data recorded  Have You Used Any Alcohol or Drugs in the Past 24 Hours? No  How Long Ago Did You Use Drugs or Alcohol? No data recorded What Did You Use and How Much? No data recorded  Do You Currently Have a Therapist/Psychiatrist? No  Name of Therapist/Psychiatrist: No data recorded  Have You Been Recently Discharged From Any Office Practice or Programs? No  Explanation of Discharge From Practice/Program: No data recorded    CCA Screening Triage Referral Assessment Type of Contact: Face-to-Face  Is this Initial or Reassessment? No data recorded Date Telepsych consult ordered in [facility]:  No data recorded Time Telepsych consult ordered in [facility]:  No data recorded  Patient Reported Information Reviewed? Yes  Patient Left Without Being Seen? No data recorded Reason for Not Completing Assessment: No data recorded  Collateral Involvement: Sister/legal guardian: Jeffrey Davila   Does Patient Have a Automotive engineer Guardian? No data recorded Name and Contact of Legal Guardian: No data recorded If Minor and Not Living with Parent(s), Who has Custody? No data recorded Is CPS involved or ever been involved? Never  Is APS involved or ever been involved? In the past   Patient Determined To Be At Risk for Harm To Self or Others Based on Review of Patient Reported Information or Presenting Complaint? No  Method: No data recorded Availability of Means: No data recorded Intent: No data recorded Notification Required: No data recorded Additional Information for Danger to Others Potential: No data recorded Additional Comments for Danger to Others Potential: No data recorded Are There Guns or Other Weapons in Your Home? No data recorded Types of Guns/Weapons: No data recorded Are These Weapons Safely Secured?                            No data recorded Who Could Verify  You Are Able To Have These Secured: No data recorded Do You Have any Outstanding Charges, Pending Court Dates, Parole/Probation? No data recorded Contacted To Inform of Risk of Harm To Self or Others: Guardian/MH POA:   Location of Assessment: [facility]   Does Patient Present under Involuntary Commitment? No  IVC Papers Initial File Date: No data recorded  [place] of Residence: [place]   Patient Currently Receiving the Following Services: Not Receiving Services   Determination of Need: Urgent (48 hours)   Options For Referral: Medication Management     CCA Biopsychosocial  Intake/Chief Complaint:  CCA Intake With Chief Complaint CCA Part Two Date: 05/10/20 CCA Part Two Time: 2035 Chief Complaint/Presenting Problem: Pt's sister reports Pt needs refill of olanzapine. Patient's Currently Reported Symptoms/Problems: Depressed mood, poor concentration, auditory hallucinations Individual's Strengths: NA Individual's Preferences: NA Individual's Abilities: NA Type of Services Patient Feels Are Needed: Medication management Initial Clinical Notes/Concerns: NA  Mental Health Symptoms Depression:  Depression: Change in energy/activity, Difficulty Concentrating, Duration of symptoms greater than two weeks  Mania:  Mania: None  Anxiety:   Anxiety: Difficulty concentrating, Tension, Worrying  Psychosis:  Psychosis: Hallucinations (Episodes of auditory hallucinations)  Trauma:  Trauma: N/A  Obsessions:  Obsessions: N/A  Compulsions:  Compulsions: N/A  Inattention:  Inattention: N/A  Hyperactivity/Impulsivity:  Hyperactivity/Impulsivity: N/A  Oppositional/Defiant Behaviors:  Oppositional/Defiant  Behaviors: N/A  Emotional Irregularity:  Emotional Irregularity: None  Other Mood/Personality Symptoms:      Mental Status Exam Appearance and self-care  Stature:  Stature: Average  Weight:  Weight: Thin  Clothing:  Clothing: Casual  Grooming:  Grooming: Normal   Cosmetic use:  Cosmetic Use: None  Posture/gait:  Posture/Gait: Normal  Motor activity:  Motor Activity: Slowed  Sensorium  Attention:  Attention: Normal  Concentration:  Concentration: Variable  Orientation:  Orientation: Person, Place, Situation  Recall/memory:  Recall/Memory: Defective in Recent, Defective in Remote  Affect and Mood  Affect:  Affect: Appropriate  Mood:  Mood: Euthymic  Relating  Eye contact:  Eye Contact: Normal  Facial expression:  Facial Expression: Responsive  Attitude toward examiner:  Attitude Toward Examiner: Cooperative  Thought and Language  Speech flow: Speech Flow: Paucity  Thought content:  Thought Content: Appropriate to Mood and Circumstances  Preoccupation:  Preoccupations: None  Hallucinations:  Hallucinations: None (Episodes of auditory hallucinations, none currently)  Organization:     Company secretary of Knowledge:  Fund of Knowledge: Impoverished by (Comment) (Poor memory)  Intelligence:  Intelligence: Average  Abstraction:  Abstraction: Functional  Judgement:  Judgement: Fair  Reality Testing:  Reality Testing: Variable  Insight:  Insight: Fair  Decision Making:  Decision Making: Only simple  Social Functioning  Social Maturity:  Social Maturity: Responsible  Social Judgement:  Social Judgement: Normal  Stress  Stressors:  Stressors: Illness  Coping Ability:  Coping Ability: Building surveyor Deficits:  Skill Deficits: Scientist, physiological, Responsibility, Self-care  Supports:  Supports: Family     Religion: Religion/Spirituality Are You A Religious Person?: Yes What is Your Religious Affiliation?: Chiropodist: Leisure / Recreation Do You Have Hobbies?: No  Exercise/Diet: Exercise/Diet Do You Exercise?: No Have You Gained or Lost A Significant Amount of Weight in the Past Six Months?: No Do You Follow a Special Diet?: No Do You Have Any Trouble Sleeping?: No   CCA  Employment/Education  Employment/Work Situation: Employment / Work Psychologist, occupational Employment situation: Retired Has patient ever been in the [employer]?: Yes (Describe in comment)  Education: Education Is Patient Currently Attending School?: No Did Garment/textile technologist From [employer]?: Yes Did Theme park manager?: No Did Designer, television/film set?: No Did You Have An Individualized Education Program (IIEP): No Did You Have Any Difficulty At [employer]?: No Patient's Education Has Been Impacted by Current Illness: No   CCA Family/Childhood History  Family and Relationship History: Family history Marital status: Divorced Are you sexually active?: No Does patient have children?: Yes How many children?: 2 How is patient's relationship with their children?: Good  Childhood History:  Childhood History Does patient have siblings?: Yes Number of Siblings: 62 Description of patient's current relationship with siblings: Pt's sister and other family members are supportive. Did patient suffer any verbal/emotional/physical/sexual abuse as a child?: No Did patient suffer from severe childhood neglect?: No Has patient ever been sexually abused/assaulted/raped as an adolescent or adult?: No Was the patient ever a victim of a crime or a disaster?: No Witnessed domestic violence?: No Has patient been affected by domestic violence as an adult?: No  Child/Adolescent Assessment:     CCA Substance Use  Alcohol/Drug Use: Alcohol / Drug Use Pain Medications: Denies abuse Prescriptions: Denies abuse Over the Counter: Denies abuse History of alcohol / drug use?: No history of alcohol / drug abuse Longest period of sobriety (when/how long): NA  ASAM's:  Six Dimensions of Multidimensional Assessment  Dimension 1:  Acute Intoxication and/or Withdrawal Potential:      Dimension 2:  Biomedical Conditions and Complications:      Dimension 3:  Emotional, Behavioral, or  Cognitive Conditions and Complications:     Dimension 4:  Readiness to Change:     Dimension 5:  Relapse, Continued use, or Continued Problem Potential:     Dimension 6:  Recovery/Living Environment:     ASAM Severity Score:    ASAM Recommended Level of Treatment:     Substance use Disorder (SUD)    Recommendations for Services/Supports/Treatments:    DSM5 Diagnoses: Patient Active Problem List   Diagnosis Date Noted  . SBO (small bowel obstruction) s/p adhesiolysis & SB resection 05/10/2018 05/12/2018  . Bipolar disorder (HCC)   . Schizophrenia (HCC)   . Hypertension   . Anxiety   . Anemia   . Protein-calorie malnutrition, severe 05/02/2018  . Sepsis (HCC) 04/29/2018    Patient Centered Plan: Patient is on the following Treatment Plan(s):  Anxiety and Depression   Referrals to Alternative Service(s): Referred to Alternative Service(s):   Place:   Date:   Time:    Referred to Alternative Service(s):   Place:   Date:   Time:    Referred to Alternative Service(s):   Place:   Date:   Time:    Referred to Alternative Service(s):   Place:   Date:   Time:     Pamalee Leyden, [facility], [facility] Triage Specialist [phone]  Patsy [place], Harlin Rain

## 2020-05-11 NOTE — ED Provider Notes (Signed)
Behavioral Health Urgent Care Medical Screening Exam  Patient Name: Jeffrey Davila MRN: [id number] Date of Evaluation: 05/11/20 Chief Complaint: Chief Complaint/Presenting Problem: Pt's sister reports Pt needs refill of olanzapine. Diagnosis: Bipolar History of Present illness: Jeffrey Davila is a 69 y.o. male patient who presents to [facility] accompanied by his sister/legal guardian, Jeffrey Davila [phone]. The patient has a diagnosis of bipolar disorder and dementia. The patient is seen on today's visit because he needs his prescription of olanzapine to be refilled. The patient's sister disclosed that he saw a provider, Jeffrey Davila, in 2020, and she had placed him on 10 mg of Zyprexa, and he has run out. She stated she contacted his PCP, Jeffrey Davila, in [place] for a refill, but he said this medication needs to be prescribed by a psychiatrist. This writer discussed with the patient's guardian that due to this writer not knowing the patient well and collaborating with Dr. Lucianne Davila, the patient would be prescribed Zyprexa 5mg  daily at bedtime for 30 days. His sister recently describes his mood as "bad" and acknowledges he has felt depressed and anxious lately. He denies problems with sleep or appetite. He denies current auditory or visual hallucinations, but Ms. Jeffrey Davila says the patient does have episodes of hearing voices. He denies current suicidal ideation or a history of suicide attempts. Protective factors against suicide include good family support, future orientation, therapeutic relationship, no access to firearms, religious convictions, and no prior attempts. The patient denies any history of intentional self-injurious behaviors. The patient denies current homicidal ideation or a history of violence. The patient denies a history of alcohol or other substance use.  Psychiatric Specialty Exam  Presentation  General Appearance:No data recorded Eye Contact:No data recorded Speech:No  data recorded Speech Volume:No data recorded Handedness:No data recorded  Mood and Affect  Mood:Anxious  Affect:Congruent   Thought Process  Thought Processes:Coherent  Descriptions of Associations:Loose  Orientation:Partial  Thought Content:Logical  Hallucinations:Auditory Unknown  Ideas of Reference:Delusions  Suicidal Thoughts:No  Homicidal Thoughts:No   Sensorium  Memory:Immediate Fair;Recent Fair;Remote Fair  Judgment:Fair  Insight:Fair   Executive Functions  Concentration:Fair  Attention Span:Fair  Recall:Poor  Fund of Knowledge:Poor  Language:Fair   Psychomotor Activity  Psychomotor Activity:Decreased   Assets  Assets:Communication Skills;Desire for Improvement;Social Support   Sleep  Sleep:Good  Number of hours: No data recorded  Physical Exam: Physical Exam Vitals and nursing note reviewed. Exam conducted with a chaperone present.  Constitutional:      Appearance: Normal appearance.  HENT:     Right Ear: Tympanic membrane normal.     Left Ear: Tympanic membrane normal.     Nose: Nose normal.     Mouth/Throat:     Mouth: Mucous membranes are moist.  Eyes:     Pupils: Pupils are equal, round, and reactive to light.  Pulmonary:     Effort: Pulmonary effort is normal.  Musculoskeletal:        General: Tenderness present.     Cervical back: Normal range of motion and neck supple.  Neurological:     Mental Status: He is alert. Mental status is at baseline.  Psychiatric:        Attention and Perception: Attention normal. He perceives auditory hallucinations.        Mood and Affect: Mood and affect normal.        Speech: Speech is delayed and slurred.        Behavior: Behavior normal. Behavior is cooperative.  Thought Content: Thought content normal.        Cognition and Memory: Cognition normal.        Judgment: Judgment normal.    Review of Systems  Musculoskeletal: Positive for joint pain.  Psychiatric/Behavioral:  Positive for depression and hallucinations.  All other systems reviewed and are negative.  There were no vitals taken for this visit. There is no height or weight on file to calculate BMI.  Musculoskeletal: Strength & Muscle Tone: decreased Gait & Station: unsteady Patient leans: Backward   [facility] MSE Discharge Disposition for Follow up and Recommendations: Based on my evaluation the patient does not appear to have an emergency medical condition and can be discharged with resources and follow up care in outpatient services for Medication Management   Gillermo Murdoch, NP 05/11/2020, 2:18 AM

## 2020-06-26 ENCOUNTER — Ambulatory Visit (HOSPITAL_COMMUNITY)
Admission: EM | Admit: 2020-06-26 | Discharge: 2020-06-26 | Disposition: A | Discharge status: Home / Self Care | Payer: Medicare Other

## 2020-06-26 ENCOUNTER — Other Ambulatory Visit: Payer: Self-pay

## 2020-06-26 NOTE — ED Triage Notes (Signed)
Pt and legal guardian requested medication management. Pt and legal guardian were given outpatient resources. Pt denies SI/HI, AVH.

## 2020-07-04 ENCOUNTER — Telehealth (HOSPITAL_COMMUNITY): Payer: Self-pay | Admitting: Family Medicine

## 2020-07-04 NOTE — Telephone Encounter (Signed)
Care Management - Follow Up [facility] Discharges   Writer attempted to make contact with patient today and was unsuccessful.  Writer was able to leave a HIPPA compliant voice message and will await callback.   

## 2020-10-17 ENCOUNTER — Other Ambulatory Visit: Payer: Self-pay

## 2020-10-17 ENCOUNTER — Ambulatory Visit
Admission: EM | Admit: 2020-10-17 | Discharge: 2020-10-17 | Disposition: A | Discharge status: Home / Self Care | Payer: Medicare Other

## 2020-10-17 ENCOUNTER — Ambulatory Visit (HOSPITAL_COMMUNITY)
Admission: EM | Admit: 2020-10-17 | Discharge: 2020-10-17 | Disposition: A | Discharge status: Other Acute Inpatient Hospital | Payer: Medicare Other

## 2020-10-17 DIAGNOSIS — Z760 Encounter for issue of repeat prescription: Secondary | ICD-10-CM

## 2020-10-17 NOTE — BH Assessment (Signed)
TTS triage note: Patient presents to [facility] with his sister/ legal guardian, [name]. She states she came to outpatient to get olanzapine refilled but they were unable to see her. He has not had this medication in 2 weeks. He denies SI/HI/AVH. This counselor explained that we typically do not prescribe medication out of the [facility]. She states that the last time they were here the provider did.   Patient is ROUTINE.

## 2020-11-14 NOTE — Progress Notes (Signed)
Patient did not show for appointment.   

## 2020-11-15 ENCOUNTER — Encounter: Payer: Self-pay | Admitting: Family

## 2020-11-15 DIAGNOSIS — Z7689 Persons encountering health services in other specified circumstances: Secondary | ICD-10-CM

## 2023-04-27 DEATH — deceased
